# Patient Record
Sex: Male | Born: 1940 | Race: White | Hispanic: No | Smoking: Never smoker
Health system: Southern US, Community
[De-identification: ages and names within clinical notes are randomized; demographics above are authoritative.]

## PROBLEM LIST (undated history)

## (undated) DIAGNOSIS — G43909 Migraine, unspecified, not intractable, without status migrainosus: Secondary | ICD-10-CM

## (undated) DIAGNOSIS — K219 Gastro-esophageal reflux disease without esophagitis: Secondary | ICD-10-CM

## (undated) DIAGNOSIS — M545 Low back pain, unspecified: Secondary | ICD-10-CM

## (undated) DIAGNOSIS — I38 Endocarditis, valve unspecified: Secondary | ICD-10-CM

## (undated) DIAGNOSIS — I219 Acute myocardial infarction, unspecified: Secondary | ICD-10-CM

## (undated) DIAGNOSIS — I10 Essential (primary) hypertension: Secondary | ICD-10-CM

## (undated) DIAGNOSIS — E7800 Pure hypercholesterolemia, unspecified: Secondary | ICD-10-CM

## (undated) DIAGNOSIS — J189 Pneumonia, unspecified organism: Secondary | ICD-10-CM

## (undated) DIAGNOSIS — M1990 Unspecified osteoarthritis, unspecified site: Secondary | ICD-10-CM

## (undated) DIAGNOSIS — I2510 Atherosclerotic heart disease of native coronary artery without angina pectoris: Secondary | ICD-10-CM

## (undated) DIAGNOSIS — G8929 Other chronic pain: Secondary | ICD-10-CM

## (undated) DIAGNOSIS — D649 Anemia, unspecified: Secondary | ICD-10-CM

## (undated) DIAGNOSIS — Z950 Presence of cardiac pacemaker: Secondary | ICD-10-CM

## (undated) DIAGNOSIS — R011 Cardiac murmur, unspecified: Secondary | ICD-10-CM

## (undated) HISTORY — PX: APPENDECTOMY: SHX54

## (undated) HISTORY — DX: Gastro-esophageal reflux disease without esophagitis: K21.9

## (undated) HISTORY — PX: INGUINAL HERNIA REPAIR: SUR1180

## (undated) HISTORY — PX: CYSTECTOMY: SUR359

## (undated) HISTORY — PX: COLON SURGERY: SHX602

---

## 1997-09-28 ENCOUNTER — Observation Stay (HOSPITAL_COMMUNITY): Admission: AD | Admit: 1997-09-28 | Discharge: 1997-09-29 | Payer: Self-pay | Admitting: Cardiovascular Disease

## 1997-10-02 ENCOUNTER — Ambulatory Visit (HOSPITAL_BASED_OUTPATIENT_CLINIC_OR_DEPARTMENT_OTHER): Admission: RE | Admit: 1997-10-02 | Discharge: 1997-10-02 | Payer: Self-pay | Admitting: Orthopedic Surgery

## 1999-09-28 ENCOUNTER — Emergency Department (HOSPITAL_COMMUNITY): Admission: EM | Admit: 1999-09-28 | Discharge: 1999-09-28 | Payer: Self-pay | Admitting: Emergency Medicine

## 2001-01-23 ENCOUNTER — Emergency Department (HOSPITAL_COMMUNITY): Admission: EM | Admit: 2001-01-23 | Discharge: 2001-01-23 | Payer: Self-pay | Admitting: Emergency Medicine

## 2010-04-15 ENCOUNTER — Encounter: Payer: Self-pay | Admitting: Internal Medicine

## 2010-04-15 ENCOUNTER — Inpatient Hospital Stay (HOSPITAL_COMMUNITY)
Admission: EM | Admit: 2010-04-15 | Discharge: 2010-04-21 | Payer: Self-pay | Source: Home / Self Care | Attending: Internal Medicine | Admitting: Internal Medicine

## 2010-04-21 ENCOUNTER — Encounter: Payer: Self-pay | Admitting: Internal Medicine

## 2010-04-21 LAB — POCT I-STAT, CHEM 8
BUN: 20 mg/dL (ref 6–23)
Calcium, Ion: 1.07 mmol/L — ABNORMAL LOW (ref 1.12–1.32)
Chloride: 102 mEq/L (ref 96–112)
Creatinine, Ser: 1.3 mg/dL (ref 0.4–1.5)
Glucose, Bld: 136 mg/dL — ABNORMAL HIGH (ref 70–99)
HCT: 32 % — ABNORMAL LOW (ref 39.0–52.0)
Hemoglobin: 10.9 g/dL — ABNORMAL LOW (ref 13.0–17.0)
Potassium: 3.8 mEq/L (ref 3.5–5.1)
Sodium: 137 mEq/L (ref 135–145)
TCO2: 26 mmol/L (ref 0–100)

## 2010-04-21 LAB — CBC
HCT: 33.1 % — ABNORMAL LOW (ref 39.0–52.0)
HCT: 33.6 % — ABNORMAL LOW (ref 39.0–52.0)
HCT: 32.9 % — ABNORMAL LOW (ref 39.0–52.0)
Hemoglobin: 11.1 g/dL — ABNORMAL LOW (ref 13.0–17.0)
Hemoglobin: 11 g/dL — ABNORMAL LOW (ref 13.0–17.0)
Hemoglobin: 11 g/dL — ABNORMAL LOW (ref 13.0–17.0)
MCH: 29.8 pg (ref 26.0–34.0)
MCH: 29.4 pg (ref 26.0–34.0)
MCH: 30 pg (ref 26.0–34.0)
MCHC: 33.5 g/dL (ref 30.0–36.0)
MCHC: 32.7 g/dL (ref 30.0–36.0)
MCHC: 33.4 g/dL (ref 30.0–36.0)
MCV: 89 fL (ref 78.0–100.0)
MCV: 89.8 fL (ref 78.0–100.0)
MCV: 89.6 fL (ref 78.0–100.0)
Platelets: 122 10*3/uL — ABNORMAL LOW (ref 150–400)
Platelets: 161 10*3/uL (ref 150–400)
Platelets: 177 10*3/uL (ref 150–400)
RBC: 3.72 MIL/uL — ABNORMAL LOW (ref 4.22–5.81)
RBC: 3.74 MIL/uL — ABNORMAL LOW (ref 4.22–5.81)
RBC: 3.67 MIL/uL — ABNORMAL LOW (ref 4.22–5.81)
RDW: 13.5 % (ref 11.5–15.5)
RDW: 13.5 % (ref 11.5–15.5)
RDW: 13.6 % (ref 11.5–15.5)
WBC: 9.1 10*3/uL (ref 4.0–10.5)
WBC: 7.4 10*3/uL (ref 4.0–10.5)
WBC: 7 10*3/uL (ref 4.0–10.5)

## 2010-04-21 LAB — PROTIME-INR
INR: 1.16 (ref 0.00–1.49)
Prothrombin Time: 15 seconds (ref 11.6–15.2)

## 2010-04-21 LAB — COMPREHENSIVE METABOLIC PANEL
ALT: 14 U/L (ref 0–53)
AST: 20 U/L (ref 0–37)
Albumin: 3.2 g/dL — ABNORMAL LOW (ref 3.5–5.2)
Alkaline Phosphatase: 72 U/L (ref 39–117)
BUN: 16 mg/dL (ref 6–23)
CO2: 25 mEq/L (ref 19–32)
Calcium: 8.5 mg/dL (ref 8.4–10.5)
Chloride: 99 mEq/L (ref 96–112)
Creatinine, Ser: 1.15 mg/dL (ref 0.4–1.5)
GFR calc Af Amer: >60 mL/min (ref >60)
GFR calc non Af Amer: >60 mL/min (ref >60)
Glucose, Bld: 136 mg/dL — ABNORMAL HIGH (ref 70–99)
Potassium: 3.8 mEq/L (ref 3.5–5.1)
Sodium: 135 mEq/L (ref 135–145)
Total Bilirubin: 0.6 mg/dL (ref 0.3–1.2)
Total Protein: 6.6 g/dL (ref 6.0–8.3)

## 2010-04-21 LAB — BASIC METABOLIC PANEL
BUN: 12 mg/dL (ref 6–23)
CO2: 29 mEq/L (ref 19–32)
Calcium: 8.8 mg/dL (ref 8.4–10.5)
Chloride: 104 mEq/L (ref 96–112)
Creatinine, Ser: 1.13 mg/dL (ref 0.4–1.5)
GFR calc Af Amer: >60 mL/min (ref >60)
GFR calc non Af Amer: >60 mL/min (ref >60)
Glucose, Bld: 102 mg/dL — ABNORMAL HIGH (ref 70–99)
Potassium: 4.3 mEq/L (ref 3.5–5.1)
Sodium: 139 mEq/L (ref 135–145)

## 2010-04-21 LAB — URINALYSIS, ROUTINE W REFLEX MICROSCOPIC
Bilirubin Urine: NEGATIVE
Hgb urine dipstick: NEGATIVE
Ketones, ur: 15 mg/dL — AB
Leukocytes, UA: NEGATIVE
Nitrite: NEGATIVE
Protein, ur: 30 mg/dL — AB
Specific Gravity, Urine: 1.025 (ref 1.005–1.030)
Urine Glucose, Fasting: NEGATIVE mg/dL
Urobilinogen, UA: 0.2 mg/dL (ref 0.0–1.0)
pH: 5.5 (ref 5.0–8.0)

## 2010-04-21 LAB — VANCOMYCIN, TROUGH: Vancomycin Tr: 14.9 ug/mL (ref 10.0–20.0)

## 2010-04-21 LAB — DIFFERENTIAL
Basophils Absolute: 0 10*3/uL (ref 0.0–0.1)
Basophils Absolute: 0 10*3/uL (ref 0.0–0.1)
Basophils Relative: 0 % (ref 0–1)
Basophils Relative: 0 % (ref 0–1)
Eosinophils Absolute: 0.1 10*3/uL (ref 0.0–0.7)
Eosinophils Absolute: 0.4 10*3/uL (ref 0.0–0.7)
Eosinophils Relative: 1 % (ref 0–5)
Eosinophils Relative: 5 % (ref 0–5)
Lymphocytes Relative: 16 % (ref 12–46)
Lymphocytes Relative: 22 % (ref 12–46)
Lymphs Abs: 1.5 10*3/uL (ref 0.7–4.0)
Lymphs Abs: 1.6 10*3/uL (ref 0.7–4.0)
Monocytes Absolute: 1 10*3/uL (ref 0.1–1.0)
Monocytes Absolute: 0.9 10*3/uL (ref 0.1–1.0)
Monocytes Relative: 11 % (ref 3–12)
Monocytes Relative: 11 % (ref 3–12)
Neutro Abs: 6.5 10*3/uL (ref 1.7–7.7)
Neutro Abs: 4.6 10*3/uL (ref 1.7–7.7)
Neutrophils Relative %: 72 % (ref 43–77)
Neutrophils Relative %: 61 % (ref 43–77)

## 2010-04-21 LAB — RETICULOCYTES
RBC.: 3.74 MIL/uL — ABNORMAL LOW (ref 4.22–5.81)
Retic Count, Absolute: 71.1 10*3/uL (ref 19.0–186.0)
Retic Ct Pct: 1.9 % (ref 0.4–3.1)

## 2010-04-21 LAB — TROPONIN I: Troponin I: 0.04 ng/mL (ref 0.00–0.06)

## 2010-04-21 LAB — APTT: aPTT: 33 seconds (ref 24–37)

## 2010-04-21 LAB — LIPID PANEL
Cholesterol: 136 mg/dL (ref 0–200)
HDL: 37 mg/dL — ABNORMAL LOW (ref >39)
LDL Cholesterol: 90 mg/dL (ref 0–99)
Total CHOL/HDL Ratio: 3.7 RATIO
Triglycerides: 44 mg/dL (ref <150)
VLDL: 9 mg/dL (ref 0–40)

## 2010-04-21 LAB — TECHNOLOGIST SMEAR REVIEW

## 2010-04-21 LAB — URINE MICROSCOPIC-ADD ON

## 2010-04-21 LAB — POCT CARDIAC MARKERS
CKMB, poc: 1.1 ng/mL (ref 1.0–8.0)
Myoglobin, poc: 156 ng/mL (ref 12–200)
Troponin i, poc: 0.11 ng/mL — ABNORMAL HIGH (ref 0.00–0.09)

## 2010-04-21 LAB — CARDIAC PANEL(CRET KIN+CKTOT+MB+TROPI)
CK, MB: 1.8 ng/mL (ref 0.3–4.0)
CK, MB: 1.5 ng/mL (ref 0.3–4.0)
Relative Index: 1.7 (ref 0.0–2.5)
Relative Index: INVALID (ref 0.0–2.5)
Total CK: 109 U/L (ref 7–232)
Total CK: 94 U/L (ref 7–232)
Troponin I: 0.04 ng/mL (ref 0.00–0.06)
Troponin I: 0.05 ng/mL (ref 0.00–0.06)

## 2010-04-21 LAB — IRON AND TIBC
Iron: 17 ug/dL — ABNORMAL LOW (ref 42–135)
Saturation Ratios: 7 % — ABNORMAL LOW (ref 20–55)
TIBC: 258 ug/dL (ref 215–435)
UIBC: 241 ug/dL

## 2010-04-21 LAB — ABO/RH: ABO/RH(D): O POS

## 2010-04-21 LAB — TYPE AND SCREEN
ABO/RH(D): O POS
Antibody Screen: NEGATIVE

## 2010-04-21 LAB — HEMOCCULT GUIAC POC 1CARD (OFFICE): Fecal Occult Bld: NEGATIVE

## 2010-04-21 LAB — TRANSFERRIN: Transferrin: 166 mg/dL — ABNORMAL LOW (ref 212–360)

## 2010-04-21 LAB — CK TOTAL AND CKMB (NOT AT ARMC)
CK, MB: 1.8 ng/mL (ref 0.3–4.0)
Relative Index: INVALID (ref 0.0–2.5)
Total CK: 77 U/L (ref 7–232)

## 2010-04-21 LAB — FERRITIN: Ferritin: 163 ng/mL (ref 22–322)

## 2010-04-21 LAB — PATHOLOGIST SMEAR REVIEW

## 2010-04-23 LAB — CBC
HCT: 32 % — ABNORMAL LOW (ref 39.0–52.0)
Hemoglobin: 10.5 g/dL — ABNORMAL LOW (ref 13.0–17.0)
MCH: 29.4 pg (ref 26.0–34.0)
MCHC: 32.8 g/dL (ref 30.0–36.0)
MCV: 89.6 fL (ref 78.0–100.0)
Platelets: 179 10*3/uL (ref 150–400)
RBC: 3.57 MIL/uL — ABNORMAL LOW (ref 4.22–5.81)
RDW: 13.5 % (ref 11.5–15.5)
WBC: 7.2 10*3/uL (ref 4.0–10.5)

## 2010-04-23 LAB — CULTURE, BLOOD (ROUTINE X 2): Culture  Setup Time: 201201102213

## 2010-04-23 LAB — BASIC METABOLIC PANEL
BUN: 14 mg/dL (ref 6–23)
CO2: 28 mEq/L (ref 19–32)
Calcium: 8.5 mg/dL (ref 8.4–10.5)
Chloride: 103 mEq/L (ref 96–112)
Creatinine, Ser: 1.03 mg/dL (ref 0.4–1.5)
GFR calc Af Amer: >60 mL/min (ref >60)
GFR calc non Af Amer: >60 mL/min (ref >60)
Glucose, Bld: 94 mg/dL (ref 70–99)
Potassium: 4.1 mEq/L (ref 3.5–5.1)
Sodium: 136 mEq/L (ref 135–145)

## 2010-04-24 ENCOUNTER — Encounter: Payer: Self-pay | Admitting: Internal Medicine

## 2010-04-24 ENCOUNTER — Telehealth (INDEPENDENT_AMBULATORY_CARE_PROVIDER_SITE_OTHER): Payer: Self-pay | Admitting: *Deleted
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
## 2010-04-25 ENCOUNTER — Telehealth: Payer: Self-pay | Admitting: Internal Medicine

## 2010-04-28 ENCOUNTER — Encounter: Payer: Self-pay | Admitting: Internal Medicine

## 2010-04-28 LAB — CULTURE, BLOOD (ROUTINE X 2)
Culture  Setup Time: 201201141728
Culture  Setup Time: 201201141728
Culture: NO GROWTH
Culture: NO GROWTH

## 2010-04-29 ENCOUNTER — Telehealth (INDEPENDENT_AMBULATORY_CARE_PROVIDER_SITE_OTHER): Payer: Self-pay | Admitting: *Deleted

## 2010-05-01 LAB — CULTURE, BLOOD (ROUTINE X 2): Culture  Setup Time: 201201102213

## 2010-05-02 ENCOUNTER — Encounter: Payer: Self-pay | Admitting: Internal Medicine

## 2010-05-06 ENCOUNTER — Encounter: Payer: Self-pay | Admitting: Internal Medicine

## 2010-05-06 DIAGNOSIS — D509 Iron deficiency anemia, unspecified: Secondary | ICD-10-CM | POA: Insufficient documentation

## 2010-05-06 DIAGNOSIS — I421 Obstructive hypertrophic cardiomyopathy: Secondary | ICD-10-CM | POA: Insufficient documentation

## 2010-05-06 DIAGNOSIS — Z8679 Personal history of other diseases of the circulatory system: Secondary | ICD-10-CM | POA: Insufficient documentation

## 2010-05-06 DIAGNOSIS — R0989 Other specified symptoms and signs involving the circulatory and respiratory systems: Secondary | ICD-10-CM | POA: Insufficient documentation

## 2010-05-06 DIAGNOSIS — R0609 Other forms of dyspnea: Secondary | ICD-10-CM

## 2010-05-06 DIAGNOSIS — R9389 Abnormal findings on diagnostic imaging of other specified body structures: Secondary | ICD-10-CM | POA: Insufficient documentation

## 2010-05-06 DIAGNOSIS — R55 Syncope and collapse: Secondary | ICD-10-CM | POA: Insufficient documentation

## 2010-05-07 ENCOUNTER — Encounter: Payer: Self-pay | Admitting: Physician Assistant

## 2010-05-07 ENCOUNTER — Ambulatory Visit (INDEPENDENT_AMBULATORY_CARE_PROVIDER_SITE_OTHER): Payer: BC Managed Care – PPO | Admitting: Physician Assistant

## 2010-05-07 ENCOUNTER — Ambulatory Visit: Admit: 2010-05-07 | Payer: Self-pay | Admitting: Physician Assistant

## 2010-05-07 DIAGNOSIS — D509 Iron deficiency anemia, unspecified: Secondary | ICD-10-CM

## 2010-05-07 DIAGNOSIS — I421 Obstructive hypertrophic cardiomyopathy: Secondary | ICD-10-CM

## 2010-05-07 DIAGNOSIS — I330 Acute and subacute infective endocarditis: Secondary | ICD-10-CM | POA: Insufficient documentation

## 2010-05-08 ENCOUNTER — Encounter: Payer: Self-pay | Admitting: Internal Medicine

## 2010-05-08 NOTE — Progress Notes (Addendum)
----   Converted from flag ---- Flag Received  ---- 04/29/2010 10:45 AM, Bary Leriche wrote: Doristine Devoid, [employer] is sending Dr. Graciela Husbands a physicians statement on patient Alessandra Bevels.  dob 2040-07-09.  [employer] does not do this form.  If Dr. Graciela Husbands would review it, and return it to [employer], we will fax to his insurance co.   Thank you, Elease Hashimoto  @ [employer] ------------------------------

## 2010-05-08 NOTE — Progress Notes (Addendum)
Summary: pt calling re home health  Phone Note Call from Patient   Caller: Patient [phone] Reason for Call: Talk to Nurse Summary of Call: pt went home with pic line to [facility], [facility] to check him twice a week, however if he goes back to work monday his insurance will not cover home health-what to do? Initial call taken by: Glynda Jaeger,  April 25, 2010 2:12 PM  Follow-up for Phone Call        [facility]--Stephanie [phone] left a message for her to call back.  I spoke with the pt and made him aware if he goes back to work on Monday then technically he is not homebound and [facility] would not be able to treat him.  Per DC summary if looks like Vancomycin IV is to be continued through 05/15/10 and the pt would not be able to go back to work until after that day. Julieta Gutting, RN, BSN  April 25, 2010 2:40 PM  Additional Follow-up for Phone Call Additional follow up Details #1::        tried to reach pt, no answer. left a message for stephanie that pt has a follow up appt on 05-07-10 and he would probably need to be out of work until at least seen. Deliah Goody, RN  April 25, 2010 3:27 PM  Judeth Cornfield called back and they will continue to see the pt for Vancomycin.  The pt cannot go back to work until Vancomycin is completed. The pt has a pending appt with Dr Graciela Husbands on 05/07/10.  Pt was scheduled to return to work on 04/28/10. I made the pt aware that he cannot return to work.  The pt will contact his employer about what forms need to be completed in regards to remaining out of work and these will be sent to our office for completion. I will forward this information to Dr Graciela Husbands and Bjorn Loser for review.   Additional Follow-up by: Julieta Gutting, RN, BSN,  April 25, 2010 4:40 PM    Additional Follow-up for Phone Call Additional follow up Details #2::    vancomycin is under the care of Dr Bonnita Nasuti  Follow-up by: Nathen May, MD, [facility],  April 28, 2010 5:51  PM  Additional Follow-up for Phone Call Additional follow up Details #3:: Details for Additional Follow-up Action Taken: tried to reach [facility], rna.  spoke w/pt and he has the situation under control and will contact us if he needs further assisstance. he just needed some guidance when he called initially.  Additional Follow-up by: Claris Gladden RN,  April 28, 2010 6:32 PM

## 2010-05-08 NOTE — Progress Notes (Addendum)
  Received request from [facility] sent to [facility]  April 24, 2010 12:26 PM

## 2010-05-09 ENCOUNTER — Encounter: Payer: Self-pay | Admitting: Internal Medicine

## 2010-05-13 ENCOUNTER — Encounter: Payer: Self-pay | Admitting: Internal Medicine

## 2010-05-13 ENCOUNTER — Ambulatory Visit (INDEPENDENT_AMBULATORY_CARE_PROVIDER_SITE_OTHER): Payer: BC Managed Care – PPO | Admitting: Internal Medicine

## 2010-05-13 DIAGNOSIS — I330 Acute and subacute infective endocarditis: Secondary | ICD-10-CM

## 2010-05-13 DIAGNOSIS — D509 Iron deficiency anemia, unspecified: Secondary | ICD-10-CM
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
## 2010-05-15 ENCOUNTER — Encounter: Payer: Self-pay | Admitting: Internal Medicine

## 2010-05-15 ENCOUNTER — Encounter: Payer: Self-pay | Admitting: Physician Assistant

## 2010-05-20 ENCOUNTER — Telehealth (INDEPENDENT_AMBULATORY_CARE_PROVIDER_SITE_OTHER): Payer: Self-pay | Admitting: *Deleted
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
## 2010-05-26 NOTE — Discharge Summary (Signed)
  NAMELUCKY, [name] NO.:  [id number]  MEDICAL RECORD NO.:  [id number]          PATIENT TYPE:  INP  LOCATION:  3738                         FACILITY:  [facility]  PHYSICIAN:  Duke Salvia, MD, FACCDATE OF BIRTH:  April 23, 1940  DATE OF ADMISSION:  04/15/2010 DATE OF DISCHARGE:  04/21/2010                              DISCHARGE SUMMARY   ADDENDUM:  Dr. Daiva Eves evaluated Mr. Pompey and reviewed his antibiotic therapy.  The following med list reflects his changes.  DISCHARGE MEDICATIONS: 1. Lopressor 50 mg b.i.d. 2. Tylenol 325 mg 1-2 tablets q.4 h. p.r.n. 3. Multivitamins daily. 4. Vancomycin injection 1000 mg IV q.12 h. 5. Aspirin 325 mg daily. 6. Glucosamine take 1 in the morning and 2 in the evening. 7. Ginkgo biloba daily. 8. Bilberry OTC at bedtime. 9. Vancomycin protocol, Home Health to manage, duration of vancomycin     until May 15, 2010. 10.Cinnamon b.i.d. 11.Coenzyme Q10 3 tablets daily. 12.Hawhorn berry at bedtime. 13.Horse chestnut daily. 14.Vitamin C b.i.d. 15.Vitamin E 1-2 capsules b.i.d., 1 in the morning and 2 in the     evening.  WORK NOTE:  Stating that the patient may return to work in 1 week, but no lifting over 25 pounds with his right arm while the PICC line is in place.     Theodore Demark, PA-C   ______________________________ Duke Salvia, MD, [facility]    RB/MEDQ  D:  04/21/2010  T:  04/22/2010  Job:  [id number]  Electronically Signed by Theodore Demark PA-C on 04/28/2010 11:30:25 AM Electronically Signed by Sherryl Manges MD [facility] on 05/26/2010 09:49:33 PM
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
## 2010-06-03 NOTE — Letter (Signed)
Summary: [employer]  [employer]   Imported By: Florinda Marker 05/30/2010 15:51:21  _____________________________________________________________________  External Attachment:    Type:   Image     Comment:   External Document

## 2010-06-03 NOTE — Letter (Signed)
Summary: Discharge Summary  Discharge Summary   Imported By: Florinda Marker 05/30/2010 09:38:09  _____________________________________________________________________  External Attachment:    Type:   Image     Comment:   External Document

## 2010-06-03 NOTE — Miscellaneous (Signed)
Summary: [facility]: Verbal Orders  [facility]: Verbal Orders   Imported By: Florinda Marker 05/28/2010 11:53:03  _____________________________________________________________________  External Attachment:    Type:   Image     Comment:   External Document

## 2010-06-03 NOTE — Miscellaneous (Signed)
Summary: [facility]: Home Health Cert. & Plan Of Care  [facility]: Home Health Cert. & Plan Of Care   Imported By: Florinda Marker 05/30/2010 15:52:44  _____________________________________________________________________  External Attachment:    Type:   Image     Comment:   External Document

## 2010-06-03 NOTE — Medication Information (Signed)
Summary: Physician's Orders   Physician's Orders   Imported By: Roderic Ovens 05/22/2010 15:47:04  _____________________________________________________________________  External Attachment:    Type:   Image     Comment:   External Document

## 2010-06-05 ENCOUNTER — Other Ambulatory Visit (INDEPENDENT_AMBULATORY_CARE_PROVIDER_SITE_OTHER): Payer: BC Managed Care – PPO

## 2010-06-05 DIAGNOSIS — D509 Iron deficiency anemia, unspecified: Secondary | ICD-10-CM

## 2010-06-05 DIAGNOSIS — I330 Acute and subacute infective endocarditis: Secondary | ICD-10-CM

## 2010-06-06 ENCOUNTER — Encounter: Payer: Self-pay | Admitting: Internal Medicine

## 2010-06-11 ENCOUNTER — Encounter: Payer: Self-pay | Admitting: Internal Medicine

## 2010-06-11 ENCOUNTER — Other Ambulatory Visit: Payer: Self-pay | Admitting: Internal Medicine

## 2010-06-11 ENCOUNTER — Ambulatory Visit (INDEPENDENT_AMBULATORY_CARE_PROVIDER_SITE_OTHER): Payer: BC Managed Care – PPO | Admitting: Internal Medicine

## 2010-06-11 DIAGNOSIS — I428 Other cardiomyopathies: Secondary | ICD-10-CM

## 2010-06-11 DIAGNOSIS — R0609 Other forms of dyspnea: Secondary | ICD-10-CM

## 2010-06-11 DIAGNOSIS — R55 Syncope and collapse: Secondary | ICD-10-CM

## 2010-06-12 ENCOUNTER — Telehealth: Payer: Self-pay | Admitting: Internal Medicine

## 2010-06-12 LAB — CBC WITH DIFFERENTIAL/PLATELET
Basophils Absolute: 0 10*3/uL (ref 0.0–0.1)
Basophils Relative: 0.3 % (ref 0.0–3.0)
Eosinophils Absolute: 0.4 10*3/uL (ref 0.0–0.7)
Eosinophils Relative: 7.4 % — ABNORMAL HIGH (ref 0.0–5.0)
HCT: 34.2 % — ABNORMAL LOW (ref 39.0–52.0)
Hemoglobin: 11.7 g/dL — ABNORMAL LOW (ref 13.0–17.0)
Lymphocytes Relative: 27.5 % (ref 12.0–46.0)
Lymphs Abs: 1.6 10*3/uL (ref 0.7–4.0)
MCHC: 34.1 g/dL (ref 30.0–36.0)
MCV: 90.1 fl (ref 78.0–100.0)
Monocytes Absolute: 0.4 10*3/uL (ref 0.1–1.0)
Monocytes Relative: 7.3 % (ref 3.0–12.0)
Neutro Abs: 3.3 10*3/uL (ref 1.4–7.7)
Neutrophils Relative %: 57.5 % (ref 43.0–77.0)
Platelets: 124 10*3/uL — ABNORMAL LOW (ref 150.0–400.0)
RBC: 3.8 Mil/uL — ABNORMAL LOW (ref 4.22–5.81)
RDW: 15.6 % — ABNORMAL HIGH (ref 11.5–14.6)
WBC: 5.8 10*3/uL (ref 4.5–10.5)

## 2010-06-13 ENCOUNTER — Encounter: Payer: Self-pay | Admitting: *Deleted

## 2010-06-14 ENCOUNTER — Encounter: Payer: Self-pay | Admitting: *Deleted

## 2010-06-17 ENCOUNTER — Encounter: Payer: Self-pay | Admitting: Internal Medicine

## 2010-06-17 ENCOUNTER — Ambulatory Visit (INDEPENDENT_AMBULATORY_CARE_PROVIDER_SITE_OTHER): Payer: BC Managed Care – PPO | Admitting: Internal Medicine

## 2010-06-17 DIAGNOSIS — R55 Syncope and collapse: Secondary | ICD-10-CM

## 2010-06-17 DIAGNOSIS — I330 Acute and subacute infective endocarditis: Secondary | ICD-10-CM
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
## 2010-06-17 NOTE — Progress Notes (Signed)
  Phone Note Outgoing Call   Call placed by: Scherrie Bateman, LPN,  May 20, 2010 5:13 PM Call placed to: Patient Reason for Call: Get patient information Summary of Call: SPOKE WITH WIFE EARLIER STATED PT HAD LABS DONE ON 2/9./12 PER [facility]  AFTER CALLING [facility]  REALIZED HAD BMET DONE ON 2/9 /12 AND CBC WAS DONE ON 05/12/10 WILL REVIEW CBC AND DECIDE PLAN OF CARE  WITH DR Graciela Husbands.LMTCB WITH ABOVE INFO.  Follow-up for Phone Call        Phone call completed spoke with wife pt  has appt today with dr Graciela Husbands will repeat cbc at that time. Follow-up by: Scherrie Bateman, LPN,  June 11, 2010 10:26 AM

## 2010-06-17 NOTE — Miscellaneous (Signed)
Summary: [facility] Orders   [facility] Orders   Imported By: Roderic Ovens 06/12/2010 09:28:43  _____________________________________________________________________  External Attachment:    Type:   Image     Comment:   External Document

## 2010-06-17 NOTE — Progress Notes (Signed)
Summary: Need pt medical status  Phone Note Other Incoming   Caller: [employer] disability/ [phone] [facility] Summary of Call: Need information on the pt medical status on when pt can return to work Initial call taken by: Judie Grieve,  June 12, 2010 4:38 PM  Follow-up for Phone Call        attempted to contact [facility] at [phone]--this is a fax number Katina Dung, RN, BSN  June 12, 2010 4:41 PM   DISABILITY FORM RETURNED TO [place] ON THURS.AS I RECALL DRIVING RESTRICTIONS WERE NOTED PT MAY NOT DRIVE UNTIL JULY 2012 PER DR Graciela Husbands Follow-up by: Scherrie Bateman, LPN,  June 13, 2010 9:01 AM

## 2010-06-24 NOTE — Assessment & Plan Note (Signed)
Summary: f/u lab [mkj]   Vital Signs:  Patient profile:   70 year old male Height:      72 inches (182.88 cm) Weight:      198.25 pounds (90.11 kg) BMI:     26.98 Temp:     97.5 degrees F (36.39 degrees C) oral Pulse rate:   67 / minute BP sitting:   148 / 76  (left arm) Cuff size:   large  Vitals Entered By: Jennet Maduro RN (June 17, 2010 10:46 AM) CC: follow-up visit Is Patient Diabetic? No Pain Assessment Patient in pain? no      Nutritional Status BMI of 25 - 29 = overweight Nutritional Status Detail appetite "pretty good"  Have you ever been in a relationship where you felt threatened, hurt or afraid?not asked wife present   Does patient need assistance? Functional Status Self care Ambulation Normal   Primary Provider:  Dr. Ricki Miller  CC:  follow-up visit.  History of Present Illness: Mr. Wickham is in for his follow-up visit.  He has now been off of his intravenous vancomycin since February 9.  He is feeling well.  He has not had any fever, chills, or sweats.  His appetite is good.  He states that he had problems tolerating the full dose of metoprolol that Dr. Graciela Husbands started recently. He  found that when he was taking 75 mg twice daily he would feel dizzy when he stood up and felt like his head was in a "fog". He reduced his dose to 50 mg daily recently and feels much better.  He is wondering if it would be okay for him to drive now.  Preventive Screening-Counseling & Management  Alcohol-Tobacco     Alcohol drinks/day: <1     Smoking Status: never  Caffeine-Diet-Exercise     Caffeine use/day: yes  Safety-Violence-Falls     Seat Belt Use: yes  Current Medications (verified): 1)  Metoprolol Tartrate 50 Mg Tabs (Metoprolol Tartrate) .... Take 1 Tab By Mouth Two Times A Day 2)  Aspirin Ec 325 Mg Tbec (Aspirin) .... Take One Tablet By Mouth Daily 3)  Multivitamins   Tabs (Multiple Vitamin) .Marland Kitchen.. 1 Tab Once Daily 4)  Vitamin C 500 Mg Tabs (Ascorbic Acid) .... Take  1 Tablet By Mouth Two Times A Day 5)  Ginkgo Biloba 60 Mg Caps (Ginkgo Biloba) .... 2 Cap Once Daily 6)  Bilberry 500 Mg Caps (Bilberry (Vaccinium Myrtillus)) .... 1000mg  Per Day 7)  Horse Chestnut 300 Mg Tabs (Horse [place]) .... 2 Tab Once Daily 8)  Eql Coq10 300 Mg Caps (Coenzyme Q10) .... Take On Tablet By Mouth Once Daily 9)  Hawthorne Berries Extract 10)  Saw Palmetto 160 Mg Caps (Saw Palmetto (Serenoa Repens)) .... Take 1 Tablet By Mouth Once A Day 11)  Vitamin E 400 Unit Caps (Vitamin E) .... Take 1 Tablet By Mouth Once A Day  Allergies: 1)  ! * Penicillen  Physical Exam  General:  alert and well-nourished.   Mouth:  pharynx pink and moist.   Lungs:  normal breath sounds, no crackles, and no wheezes.   Heart:  normal rate, regular rhythm, and grade 2/6 holosystolic murmur.     Impression & Recommendations:  Problem # 1:  ENDOCARDITIS, BACTERIAL, SUBACUTE (ICD-421.0) Mr. Hayashi has no clinical evidence of relapse of his streptococcal endocarditis and has been off of all antibiotics for over one month.  Follow-up blood cultures were negative so I would consider him to be cured at this  point.  I see no problems proceeding ahead with ICD implantation if that's what he and Dr. Graciela Husbands and have decided to do.  I strongly suspect that his syncopal episode was related to his endocarditis superimposed on his hypertrophic cardiomyopathy.  I do not feel strongly that he be restricted from driving indefinitely but will leave that decision up to him and Dr. Graciela Husbands.  Orders: Est. Patient Level III (04540)  Medications Added to Medication List This Visit: 1)  Metoprolol Tartrate 50 Mg Tabs (Metoprolol tartrate) .... Take 1 tab by mouth two times a day  Patient Instructions: 1)  Please schedule a follow-up appointment as needed.   Orders Added: 1)  Est. Patient Level III [98119]

## 2010-06-25 ENCOUNTER — Telehealth: Payer: Self-pay | Admitting: Internal Medicine

## 2010-06-25 ENCOUNTER — Telehealth: Payer: Self-pay | Admitting: *Deleted

## 2010-06-25 NOTE — Telephone Encounter (Signed)
UNABLE TO LEAVE MESSAGE WILL TRY AGAIN TOMORROW. 

## 2010-06-25 NOTE — Telephone Encounter (Signed)
Pt states he has a fever. Pt does not have a primary doctor. Pt [phone]

## 2010-06-25 NOTE — Telephone Encounter (Signed)
He LM that he has been battling a fever x 2 days. Has gone as high as 102. Using bufferin. I called him back. We have no appts this week. He does not have a pcp until sometime in April. Urged him to call Cardiologist, Dr. Graciela Husbands. States he would. If unable to get in there asap, go to UC or ED. He agreed with plan.Faustino Congress

## 2010-06-26 ENCOUNTER — Telehealth: Payer: Self-pay | Admitting: Internal Medicine

## 2010-06-26 ENCOUNTER — Telehealth: Payer: Self-pay | Admitting: *Deleted

## 2010-06-26 NOTE — Telephone Encounter (Signed)
Has question re pt restriction.

## 2010-06-26 NOTE — Telephone Encounter (Signed)
SPOKE WITH PT REPORTS FEVER IS GONE ONLY COMPLAINT IS A LITTLE FATIGUE OTHER THAN THAT FEELS BETTER  INSTRUCTED TO CALL IN FUTURE WITH ANY PROBLEMS VERBALIZED UNDERSTANDING.

## 2010-06-26 NOTE — Telephone Encounter (Signed)
LMTCB

## 2010-06-26 NOTE — Telephone Encounter (Signed)
rec'd call from his job. Are there any restrictions at work on driving or lifting? Call [phone] or fax a letter to [phone]

## 2010-06-27 ENCOUNTER — Emergency Department (HOSPITAL_COMMUNITY): Payer: BC Managed Care – PPO

## 2010-06-27 ENCOUNTER — Inpatient Hospital Stay (HOSPITAL_COMMUNITY)
Admission: EM | Admit: 2010-06-27 | Discharge: 2010-07-02 | DRG: 126 | Disposition: A | Discharge status: Home Health | Payer: BC Managed Care – PPO | Attending: Internal Medicine | Admitting: Internal Medicine

## 2010-06-27 DIAGNOSIS — I517 Cardiomegaly: Secondary | ICD-10-CM

## 2010-06-27 DIAGNOSIS — I059 Rheumatic mitral valve disease, unspecified: Secondary | ICD-10-CM | POA: Diagnosis present

## 2010-06-27 DIAGNOSIS — N400 Enlarged prostate without lower urinary tract symptoms: Secondary | ICD-10-CM | POA: Diagnosis present

## 2010-06-27 DIAGNOSIS — K0530 Chronic periodontitis, unspecified: Secondary | ICD-10-CM | POA: Diagnosis present

## 2010-06-27 DIAGNOSIS — K029 Dental caries, unspecified: Secondary | ICD-10-CM | POA: Diagnosis present

## 2010-06-27 DIAGNOSIS — I421 Obstructive hypertrophic cardiomyopathy: Secondary | ICD-10-CM | POA: Diagnosis present

## 2010-06-27 DIAGNOSIS — B954 Other streptococcus as the cause of diseases classified elsewhere: Secondary | ICD-10-CM | POA: Diagnosis present

## 2010-06-27 DIAGNOSIS — I10 Essential (primary) hypertension: Secondary | ICD-10-CM | POA: Diagnosis present

## 2010-06-27 DIAGNOSIS — K469 Unspecified abdominal hernia without obstruction or gangrene: Secondary | ICD-10-CM | POA: Diagnosis present

## 2010-06-27 DIAGNOSIS — I079 Rheumatic tricuspid valve disease, unspecified: Secondary | ICD-10-CM | POA: Diagnosis present

## 2010-06-27 DIAGNOSIS — I330 Acute and subacute infective endocarditis: Principal | ICD-10-CM | POA: Diagnosis present

## 2010-06-27 LAB — CBC
HCT: 35.2 % — ABNORMAL LOW (ref 39.0–52.0)
MCH: 29.4 pg (ref 26.0–34.0)
MCV: 87.8 fL (ref 78.0–100.0)
Platelets: 93 10*3/uL — ABNORMAL LOW (ref 150–400)
RDW: 14.5 % (ref 11.5–15.5)
RDW: 14.5 % (ref 11.5–15.5)
WBC: 7.6 10*3/uL (ref 4.0–10.5)
WBC: 7.2 10*3/uL (ref 4.0–10.5)

## 2010-06-27 LAB — COMPREHENSIVE METABOLIC PANEL
Alkaline Phosphatase: 75 U/L (ref 39–117)
BUN: 16 mg/dL (ref 6–23)
Chloride: 103 mEq/L (ref 96–112)
Glucose, Bld: 110 mg/dL — ABNORMAL HIGH (ref 70–99)
Potassium: 3.7 mEq/L (ref 3.5–5.1)
Total Bilirubin: 1 mg/dL (ref 0.3–1.2)
Total Protein: 6.5 g/dL (ref 6.0–8.3)

## 2010-06-27 LAB — DIFFERENTIAL
Eosinophils Relative: 1 % (ref 0–5)
Lymphocytes Relative: 15 % (ref 12–46)
Lymphs Abs: 1.1 10*3/uL (ref 0.7–4.0)
Monocytes Relative: 11 % (ref 3–12)

## 2010-06-27 LAB — URINALYSIS, ROUTINE W REFLEX MICROSCOPIC
Nitrite: NEGATIVE
Protein, ur: NEGATIVE mg/dL
Urobilinogen, UA: 0.2 mg/dL (ref 0.0–1.0)

## 2010-06-27 LAB — CARDIAC PANEL(CRET KIN+CKTOT+MB+TROPI): CK, MB: 0.6 ng/mL (ref 0.3–4.0)

## 2010-06-27 LAB — POCT CARDIAC MARKERS: Troponin i, poc: <0.05 ng/mL (ref 0.00–0.09)

## 2010-06-27 LAB — BASIC METABOLIC PANEL
BUN: 14 mg/dL (ref 6–23)
GFR calc Af Amer: >60 mL/min (ref >60)
GFR calc non Af Amer: >60 mL/min (ref >60)
Potassium: 3.9 mEq/L (ref 3.5–5.1)
Sodium: 136 mEq/L (ref 135–145)

## 2010-06-27 LAB — CK TOTAL AND CKMB (NOT AT ARMC)
CK, MB: 1 ng/mL (ref 0.3–4.0)
Relative Index: INVALID (ref 0.0–2.5)

## 2010-06-27 LAB — TROPONIN I: Troponin I: 0.03 ng/mL (ref 0.00–0.06)

## 2010-06-28 DIAGNOSIS — I330 Acute and subacute infective endocarditis: Secondary | ICD-10-CM

## 2010-06-28 LAB — CARDIAC PANEL(CRET KIN+CKTOT+MB+TROPI)
CK, MB: 8.7 ng/mL (ref 0.3–4.0)
Total CK: 91 U/L (ref 7–232)

## 2010-06-29 DIAGNOSIS — I38 Endocarditis, valve unspecified: Secondary | ICD-10-CM

## 2010-06-29 LAB — COMPREHENSIVE METABOLIC PANEL WITH GFR
ALT: 14 U/L (ref 0–53)
AST: 25 U/L (ref 0–37)
Albumin: 3.1 g/dL — ABNORMAL LOW (ref 3.5–5.2)
Alkaline Phosphatase: 59 U/L (ref 39–117)
BUN: 15 mg/dL (ref 6–23)
CO2: 28 meq/L (ref 19–32)
Calcium: 8.6 mg/dL (ref 8.4–10.5)
Chloride: 106 meq/L (ref 96–112)
Creatinine, Ser: 1.14 mg/dL (ref 0.4–1.5)
GFR calc non Af Amer: >60 mL/min
Glucose, Bld: 107 mg/dL — ABNORMAL HIGH (ref 70–99)
Potassium: 3.9 meq/L (ref 3.5–5.1)
Sodium: 140 meq/L (ref 135–145)
Total Bilirubin: 0.8 mg/dL (ref 0.3–1.2)
Total Protein: 5.6 g/dL — ABNORMAL LOW (ref 6.0–8.3)

## 2010-06-29 LAB — CBC
HCT: 34 % — ABNORMAL LOW (ref 39.0–52.0)
Hemoglobin: 11.4 g/dL — ABNORMAL LOW (ref 13.0–17.0)
MCH: 29.5 pg (ref 26.0–34.0)
MCHC: 33.5 g/dL (ref 30.0–36.0)
MCV: 88.1 fL (ref 78.0–100.0)
Platelets: 98 10*3/uL — ABNORMAL LOW (ref 150–400)
RBC: 3.86 MIL/uL — ABNORMAL LOW (ref 4.22–5.81)
RDW: 14.6 % (ref 11.5–15.5)
WBC: 5.5 10*3/uL (ref 4.0–10.5)

## 2010-06-29 LAB — PHOSPHORUS: Phosphorus: 3.5 mg/dL (ref 2.3–4.6)

## 2010-06-29 LAB — TROPONIN I

## 2010-06-29 LAB — MAGNESIUM: Magnesium: 2.2 mg/dL (ref 1.5–2.5)

## 2010-06-29 LAB — CULTURE, BLOOD (ROUTINE X 2)

## 2010-06-29 LAB — SEDIMENTATION RATE: Sed Rate: 10 mm/hr (ref 0–16)

## 2010-06-30 ENCOUNTER — Ambulatory Visit (HOSPITAL_COMMUNITY)
Admission: RE | Admit: 2010-06-30 | Discharge: 2010-06-30 | Disposition: A | Discharge status: Home / Self Care | Payer: BC Managed Care – PPO | Source: Ambulatory Visit | Attending: Cardiovascular Disease | Admitting: Cardiovascular Disease

## 2010-06-30 ENCOUNTER — Inpatient Hospital Stay (HOSPITAL_COMMUNITY): Payer: BC Managed Care – PPO

## 2010-06-30 DIAGNOSIS — I339 Acute and subacute endocarditis, unspecified: Secondary | ICD-10-CM

## 2010-06-30 DIAGNOSIS — I059 Rheumatic mitral valve disease, unspecified: Secondary | ICD-10-CM | POA: Insufficient documentation

## 2010-06-30 DIAGNOSIS — I079 Rheumatic tricuspid valve disease, unspecified: Secondary | ICD-10-CM | POA: Insufficient documentation

## 2010-06-30 LAB — BASIC METABOLIC PANEL
BUN: 13 mg/dL (ref 6–23)
GFR calc Af Amer: >60 mL/min (ref >60)
GFR calc non Af Amer: >60 mL/min (ref >60)
Potassium: 4.3 mEq/L (ref 3.5–5.1)

## 2010-06-30 LAB — CARDIAC PANEL(CRET KIN+CKTOT+MB+TROPI)
CK, MB: 3.2 ng/mL (ref 0.3–4.0)
Total CK: 58 U/L (ref 7–232)
Troponin I: 0.68 ng/mL (ref 0.00–0.06)

## 2010-06-30 LAB — CULTURE, BLOOD (ROUTINE X 2): Culture  Setup Time: 201203231554

## 2010-06-30 LAB — MAGNESIUM: Magnesium: 2.2 mg/dL (ref 1.5–2.5)

## 2010-06-30 LAB — CBC
MCV: 88 fL (ref 78.0–100.0)
Platelets: 122 10*3/uL — ABNORMAL LOW (ref 150–400)
RDW: 14.3 % (ref 11.5–15.5)
WBC: 6.1 10*3/uL (ref 4.0–10.5)

## 2010-06-30 NOTE — Consult Note (Signed)
[name], [name]            ACCOUNT NO.:  [id number]  MEDICAL RECORD NO.:  [id number]           PATIENT TYPE:  I  LOCATION:  1416                         FACILITY:  [facility]  PHYSICIAN:  Cassell Clement, M.D. DATE OF BIRTH:  Mar 27, 1941  DATE OF CONSULTATION:  06/28/2010 DATE OF DISCHARGE:                                CONSULTATION   Please note that the patient also has a second unit number that was used on his previous hospitalization in January 2012.  His January admission is under a different unit number.  This is a very pleasant 70 year old Caucasian male who was admitted with fever and subsequent positive blood cultures.  The patient was hospitalized at [facility] from April 15, 2010, through April 21, 2010, with what was felt to be subacute bacterial endocarditis secondary to Streptococcus salivarius.  He had an echocardiogram during that admission which was negative for vegetations. It was a transthoracic echo.  He did not have a TEE on that admission because Infectious Disease did not feel that it would change the treatment.  The patient has a long history of a heart murmur dating back more than 30 years.  It was not noted when he was in the [employer] during the [place] War era, but was noted subsequently.  His echocardiogram done under that different unit number in January 2012 showed the findings of idiopathic hypertrophic subaortic stenosis with systolic anterior movement and hyperdynamic left ventricular function.  As noted, there were no vegetations noted.  The patient completed 4 weeks of intravenous antibiotics through a PICC line.  He completed that 4 weeks ago and felt well at the end of treatment.  Two weeks after he finished the antibiotics,  he had negative blood cultures.  He felt well until June 24, 2010, when he had onset of fever and malaise and since then has had some night sweats.  He came to the hospital on June 27, 2010, and was  admitted.  He had received his 4 weeks of vancomycin therapy.  PAST MEDICAL HISTORY:  Positive for BPH and long-standing heart murmur and essential hypertension.  HOME MEDICATIONS:  Include multiple vitamins and herbs.  His only prescribed medicine is metoprolol 50 mg twice a day.  ALLERGIES:  No known drug allergies.  FAMILY HISTORY:  Noncontributory.  REVIEW OF SYSTEMS:  Negative except as noted above.  He has not been experiencing any lymphadenopathy or acute arthralgias.  He has had occasional urinary urgency and hesitancy related to his BPH.  He has had recent increase in GERD symptoms, but no chest pain to suggest angina pectoris.  PHYSICAL EXAMINATION:  VITAL SIGNS:  His blood pressure is 95/64, pulse is 88, temperature 97, O2 sat 98% on room air. GENERAL APPEARANCE:  Reveals a healthy-appearing gentleman in no acute distress.  There are no skin lesions seen to suggest endocarditis. NECK:  The jugular venous pressure is normal.  Carotids normal upstroke and he does have a murmur audible in the neck transmitted from the heart. LUNGS:  Clear to auscultation. HEART:  Reveals a grade 3-4/6 harsh systolic murmur heard throughout the precordium, but loudest at the left  sternal edge and apex.  He is not on beta-blockers at the present time. ABDOMEN:  Soft and nontender.  No spleen is felt. EXTREMITIES:  Show no petechiae and no edema or phlebitis.  BLOOD WORK:  Includes a normal BUN of 14, creatinine of 1.20, CK-MB 0.06 rising to 8.7, troponin 0.04 rising to 1.05, hemoglobin is 11.8.  Chest x-ray shows normal heart size, no CHF.  EKG shows left ventricle hypertrophy with strain consistent with IHSS.  IMPRESSION: 1. Recurrent bacteremia 1 month after completing course of vancomycin     therapy for presumed endocarditis. 2. Presumptive subacute bacterial endocarditis, but no vegetations     seen on initial echo in January.  We will repeat the echo and if     necessary do a  TEE to look for the source.  Would have to keep in     mind could be a source other than his heart. 3. Longstanding idiopathic hypertrophic subaortic stenosis murmur, but     no evidence of decompensation or congestive heart failure. 4. Mild enzyme leak with elevated troponin and CK-MB possibly     secondary to stress of acute illness with his idiopathic     hypertrophic subaortic stenosis.  Less likely would be small emboli     to the coronary arteries.  PLAN:  Antibiotics as per Dr. Daiva Eves.  We will check a 2-D echo and depending on results, consider a TEE next week.  I will restart his metoprolol for his IHSS at a lower dose since his blood pressure is marginal.  Many thanks for allowing [place] to share in this pleasant gentleman's care.          ______________________________ Cassell Clement, M.D.     TB/MEDQ  D:  06/28/2010  T:  06/28/2010  Job:  [id number]  cc:   Acey Lav, MD Fax: [phone]  [facility]  Electronically Signed by Cassell Clement M.D. on 06/30/2010 12:30:17 PM

## 2010-07-01 ENCOUNTER — Inpatient Hospital Stay (HOSPITAL_COMMUNITY): Payer: BC Managed Care – PPO

## 2010-07-01 ENCOUNTER — Encounter (HOSPITAL_COMMUNITY): Payer: Self-pay | Admitting: Radiology

## 2010-07-01 LAB — CBC
Hemoglobin: 11 g/dL — ABNORMAL LOW (ref 13.0–17.0)
MCH: 29.5 pg (ref 26.0–34.0)
MCHC: 33.4 g/dL (ref 30.0–36.0)
Platelets: 132 10*3/uL — ABNORMAL LOW (ref 150–400)
RDW: 14.4 % (ref 11.5–15.5)

## 2010-07-01 LAB — BASIC METABOLIC PANEL
CO2: 28 mEq/L (ref 19–32)
Calcium: 8.5 mg/dL (ref 8.4–10.5)
Creatinine, Ser: 1.22 mg/dL (ref 0.4–1.5)
GFR calc Af Amer: >60 mL/min (ref >60)
GFR calc non Af Amer: 59 mL/min — ABNORMAL LOW (ref >60)
Sodium: 138 mEq/L (ref 135–145)

## 2010-07-01 MED ORDER — IOHEXOL 300 MG/ML  SOLN
100.0000 mL | Freq: Once | INTRAMUSCULAR | Status: AC | PRN
  Administered 2010-07-01: 100 mL via INTRAVENOUS

## 2010-07-01 NOTE — Telephone Encounter (Signed)
RN reviewed last OV note from Dr. Orvan Falconer from 06/17/2010.  Dr. Orvan Falconer noted that Dr. Sherryl Manges, Cardiologist, would be responsible for determining when the pt. Could return to work and what restrictions that the pt. May have.  RN returned the pt's call.  Left message encouraging the pt. To call Dr. Odessa Fleming office to obtain the necessary documents.  Jennet Maduro, RN

## 2010-07-01 NOTE — Telephone Encounter (Signed)
lmtcb ./cy 

## 2010-07-02 DIAGNOSIS — K036 Deposits [accretions] on teeth: Secondary | ICD-10-CM

## 2010-07-02 DIAGNOSIS — K029 Dental caries, unspecified: Secondary | ICD-10-CM

## 2010-07-02 DIAGNOSIS — K045 Chronic apical periodontitis: Secondary | ICD-10-CM

## 2010-07-02 DIAGNOSIS — K0530 Chronic periodontitis, unspecified: Secondary | ICD-10-CM

## 2010-07-02 NOTE — Consult Note (Signed)
[name], [name]            ACCOUNT NO.:  [id number]  MEDICAL RECORD NO.:  [id number]           PATIENT TYPE:  I  LOCATION:  1416                         FACILITY:  [facility]  PHYSICIAN:  Charlynne Pander, D.D.S.DATE OF BIRTH:  15-Sep-1940  DATE OF CONSULTATION:  07/01/2010 DATE OF DISCHARGE:                                CONSULTATION   HISTORY:  [name] is a 70 year old male referred by Dr. Isidor [name] for dental consultation.  The patient with a history of recurrent infective endocarditis.  The patient currently on IV antibiotic therapy with Rocephin.  The patient referred for evaluation of possible dental etiology for the recurrent infective endocarditis.  MEDICAL HISTORY.: 1. Recurrent infective endocarditis.     a. Initial episode with a history of strep salivarius bacteremia in January 2012.          The patient is status post 4 weeks of IV vancomycin therapy.     b. History of streptococcal viridans bacteremia this admission with current IV         antibiotic therapy with Rocephin 2 g IV every 24 hours for most likely the         next 6 weeks.  The patient is followed by Dr. Daiva [name] with Infectious Disease         for antibiotic therapy input. 2. Idiopathic hypertrophic subaortic stenosis (IHSS), currently     stable. 3. Hypertension, currently stable. 4. History of benign prostatic hypertrophy. 5. Right-sided hernia. 6. History of thrombocytopenia this admission.  ALLERGIES/DRUG REACTION:  PENICILLIN causes a rash  (The patient has tolerated Keflex and current Rocephin therapy without problems).  MEDICATIONS: 1. Aspirin 81 mg daily. 2. Rocephin 2 g IV every 24 hours. 3. Lovenox 40 mg subcutaneously daily. 4. Lopressor 50 mg twice daily. 5. Protonix 40 mg daily.  SOCIAL HISTORY:  The patient is married.  The patient is a nonsmoker, nondrinker.  FAMILY HISTORY:  Noncontributory.  FUNCTIONAL ASSESSMENT:  The patient is independent for ADLs prior  to this admission.  REVIEW OF SYSTEMS:  This is reviewed from the chart for this admission.  DENTAL HISTORY.:  CHIEF COMPLAINT:  The patient referred for evaluation of possible dental etiology for the bacteremia.  HISTORY OF PRESENT ILLNESS:  The patient with a history of recurrent infective endocarditis.  The patient with initial episode in January of 2012 which was treated with 4 weeks of IV vancomycin therapy.  The patient subsequent developed further symptoms and was admitted for this admission and found to have a strep viridans bacteremia.  The patient currently placed under IV antibiotic therapy with Rocephin under the direction of Dr. Daiva [name] with Infectious Disease.  A dental consultation then requested to evaluate for possible dental etiology for the bacteremia and endocarditis..  The patient currently denies acute toothache, swellings or abscesses. The patient was last seen approximately 5 years ago by Dr. Hulen [name]. The patient with a history of root canal therapy with Dr. Duwaine [name] on the lower left molar around that time.  The patient does not seek regular dental care and is in need of a dental cleaning.  DENTAL EXAM:  GENERAL:  The patient is a well-developed, well-nourished male in no acute distress. VITAL SIGNS:  His blood pressure is 102/69, pulse rate 95, respirations are 18, temperature 97.1. HEAD AND NECK EXAM:  There is no palpable submandibular lymphadenopathy. The patient denies acute TMJ symptoms.   INTRAORAL EXAM:  The patient has normal saliva.  The patient has bilateral  mandibular lingual tori. There is no evidence of abscess formation within the mouth. DENTITION:  The patient is missing tooth numbers 1, 4, 7, 16, 19, 20, 29, and 30 by my examination. PERIODONTAL:  The patient with chronic periodontitis with minimal plaque accumulations noted.  I did not perform a full periodontal charting today.  There is no significant tooth mobility noted  today. DENTAL CARIES:  There are several dental caries and suboptimal dental restorations noted.  Please see dental charting form.  Specifically tooth #28 appears to have dental caries on the buccal aspect of the crown associated with tooth #28.  I am not able to determine the extent of the dental caries due to the presence of the crown, however. CROWN OR BRIDGE:  The patient has multiple crown and bridge restorations.  Some of the margins are less than ideal due to the age of the older restorations.  Specifically tooth #28 may have less than ideal margins at the distal of tooth #28.  Also the occlusal resin initially placed over the root canal therapy of #18 is less than ideal. ENDODONTIC:  The patient denies acute pulpitis symptoms.  The patient has had a previous root canal therapy associated with tooth #18.  There is some persistent darkness around the apices and I am not sure if this represents persistent periapical infection or worsening of the periapical infection.  This area was also noted on the CT scan performed during this admission.  I will suggest referral to an endodontist for evaluation and to determine the overall success of the previous root canal therapy approximately 5 years ago associated tooth #18.  The patient also appears to have slight increased darkening around the root of tooth #28 which may represent incipient periapical pathology at this time and thus will need to evaluate this area further for need for root canal therapy.  This can be done in conjunction with the primary dentist to determine if tooth will need to be excavated prior to referral to the endodontist or if the patient will need to be referred to the endodontist first before further definitive care for tooth #28. PROSTHODONTIC:  The patient without partial dentures. OCCLUSION:  The patient with a poor occlusal scheme but a stable occlusion at this time.  RADIOGRAPHIC INTERPRETATION:  Previous  panoramic x-rays were taken in January 2012 and March 2012 at [facility] respectively in the Department Of Radiology.  I did obtain a full series of dental radiographs today.  There are multiple missing teeth.  There is incipient to moderate bone loss.  There are multiple dental restorations noted.  There is a root canal therapy associated tooth #18 with some persistent periapical radiolucency at the apices.  Suggest further evaluation by the endodontist to determine if this is persistence or a worsening of previous periapical condition.  The patient also has some incipient periapical radiolucency at the apex tooth #28.  This may represent incipient periapical pathology as well.  ASSESSMENT: 1. Strep viridans bacteremia and endocarditis with current IV     antibiotic therapy with Rocephin per Infectious Disease. 2. Some potential persistent periapical pathology  associated with     tooth #18 after root canal therapy with the endodontist     approximately 5 years ago by the patient report.  This will need to     be evaluated by the endodontist further to determine if this is     persistence or worsening of the previous periapical pathology noted     in this area. 3. Incipient periapical radiolucency at the apex tooth #28.  This will     need further evaluation by primary dentist or endodontist for     possible periapical pathology and require treatment with root canal     therapy or extraction. 4. Dental caries and suboptimal dental restorations as per dental     charting form with need for evaluation of the occlusal resin     associated with tooth #18 as well as the buccal caries associated     with tooth #28 through the crown. 5. Chronic periodontitis with bone loss. 6. Plaque accumulations. 7. History of dental neglect over the past 5 years. 8. Need for antibiotic premedication prior to invasive dental     procedures due to previous and current history of  endocarditis.     This will not need to be done most likely while the patient is     under current IV Rocephin therapy but will need to be done once the     patient is off IV antibiotic therapy.  The patient may consider     either macrolide antibiotic therapy or clindamycin antibiotic     therapy as per American Heart Association guidelines. 9. History of thrombocytopenia, to be followed as indicated for     potential risk for bleeding with invasive dental procedures.  PLAN/RECOMMENDATIONS: 1. I discussed the risks, benefits, and complications of various     treatment options in relationship to the patient's medical and     dental conditions.  The patient is to follow up with his primary     dentist Dr. Hulen [name] for evaluation of tooth #28 with eventual     referral to the endodontist for re-evaluation of the previous root     canal therapy associated with tooth #8 as well as definitive root     canal therapy of tooth #28 if indicated.  The patient most likely     will not need antibiotic premedication due to current IV therapy at     this time but will need antibiotic premedication after the patient     is off his systemic IV therapy for treatment of the endocarditis at     this time. 2. Discussion of findings with Dr. Hulen [name] and Dr. Duwaine [name]     (endodontist) as indicated to assist in coordination of future     dental care. 3. Discussion of findings with the medical team as needed to assist in     coordination of future medical care.     Charlynne Pander, D.D.S.     RFK/MEDQ  D:  07/02/2010  T:  07/02/2010  Job:  [id number]  cc:   William Lav, MD Fax: [phone]  Vesta Mixer, M.D. Fax: [phone]  Charlynne Pander, D.D.S. Fax: [phone]  Dr. Meredeth Ide, M.D.  Dr. Duwaine [name]  Electronically Signed by Cindra [name] D.D.S. on 07/02/2010 02:48:42 PM
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
## 2010-07-03 LAB — CULTURE, BLOOD (ROUTINE X 2): Culture  Setup Time: 201203232346

## 2010-07-04 ENCOUNTER — Encounter: Payer: Self-pay | Admitting: *Deleted

## 2010-07-04 ENCOUNTER — Telehealth: Payer: Self-pay | Admitting: Internal Medicine
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
## 2010-07-04 NOTE — Telephone Encounter (Signed)
AT AND T AWARE PT MAY NOT DRIVE UNTIL July PER DISABILITY NOTE

## 2010-07-05 LAB — CULTURE, BLOOD (ROUTINE X 2)
Culture  Setup Time: 201203251727
Culture: NO GROWTH
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
## 2010-07-07 NOTE — Discharge Summary (Signed)
[name], [name]            ACCOUNT NO.:  [id number]  MEDICAL RECORD NO.:  [id number]           PATIENT TYPE:  I  LOCATION:  1416                         FACILITY:  [facility]  PHYSICIAN:  Hollice Espy, M.D.DATE OF BIRTH:  16-Oct-1940  DATE OF ADMISSION:  06/27/2010 DATE OF DISCHARGE:  07/02/2010                              DISCHARGE SUMMARY   PRIMARY CARE PHYSICIAN:  The patient's PCP is going to be established as Dr. Juline Patch.  DENTIST:  His dentist is Dr. Hulen Skains.  ENDODONTIST:  His endodontist will be Dr. Duwaine Maxin.  OTHER CONSULTANTS:  His other consultants on this case are Dr. Graciela Husbands, Cardiology, and Dr. Daiva Eves, Infectious Disease  DISCHARGE DIAGNOSES: 1. Recurrent infective endocarditis with Streptococcus viridans this     admission. 2. Idiopathic hypertrophic subaortic stenosis. 3. Hypertension. 4. Benign prostatic hypertrophy. 5. Right-sided hernia. 6. Poor dentition. 7. History of thrombocytopenia 8. Periapical pathology associated with tooth #18 and incipient     periapical radiolucency at apex of tooth #28. 9. Dental caries. 10.Chronic periodontitis. 11.Plaque accumulations.  DISCHARGE MEDICATIONS:  Discharge medications for this patient are as follows:  Aspirin 81 mg p.o. daily, Rocephin 2 g IV daily for the next 39 days, ending date Aug 11, 2010.  The patient will continue on all of his herbal medications bilberry 1000, coenzyme Q 300, Gingko biloba 120, glucosamine 1000, Hawthorn Berry 565 b.i.d., horse-chestnut 300 p.o. b.i.d., metoprolol 50 b.i.d., multivitamin daily, saw palmetto 160, vitamin C 500 p.o. b.i.d., and vitamin E 400 p.o. b.i.d.  DISCHARGE DIET:  Heart-healthy diet.  ACTIVITY:  The patient is being advised to return back to work no earlier than Monday, July 14, 2010, and then after that a week of light activity.  HOSPITAL COURSE:  The patient is a 70 year old white male who was admitted on June 27, 2010, who has been  having on and off fevers and had been diagnosed and discharged on April 21, 2010, with a PICC line and 4 weeks of IV vancomycin for suspected endocarditis.  He came back in, was complaining of fevers and chills, was noted to not have a white count, no visible signs of infection.  Dr. Patty Sermons from Cardiology was consulted who evaluated the patient.  He was evaluated and was seem to have a presumptive subacute bacterial endocarditis.  A repeat echo was ordered and the patient was on IV antibiotics as well, specifically initially vancomycin and then  Rocephin for 7 days.  His blood cultures grew out viridans group streptococci.  When his 2-D echo did not show any findings, a TEE was ordered, he had a sizable vegetation, TEE was unremarkable as well.  Dental source of infection was considered given the patient's poor dentition.  Dr. Kristin Bruins from the dentistry was consulted.  He ordered a full series of dental radiographs done through tooth #28.  He was noted to have some potential persistent periapical pathology at tooth #18 after root canal therapy that had been done 5 years back.  He also noted some incipient periapical radiolucency at apex of tooth #28.  Dr. Kristin Bruins recommended discussion with Dr. Hulen Skains the patient's dentist 5  years ago for reevaluation of #28 with eventual referral to endodontist.  Dr. Kristin Bruins has been in touch with Dr. Duwaine Maxin who is an endodontist to determine if the persistence of worsening of the previous periapical pathology was noted again in this area as well as the one at #28.  He also recommended continuing on IV antibiotics for the full length of time recommended.  With these findings, the patient was otherwise felt to be medically doing well.  It was recommend because of his endocarditis he will be discharged home.  Will continue on daily IV antibiotics with plans to return back in about a week-and-a-half and at that time will need light work for  one more week's time.  His disposition is improved.  ACTIVITY:  Slowly increase.  DISCHARGE DIET:  Heart-healthy diet.  FOLLOWUP:  He will follow up with Dr. Juline Patch in the next few weeks.  He will follow up with Infectious Disease Dr. Daiva Eves 2 weeks after his antibiotic course is completed which would be approximately sometime around Aug 25, 2010.  Because of his history of subaortic stenosis, he will follow up with Dr. Graciela Husbands of Cardiology in 1 month.  His other medical issues are stable and he will have followup repeat blood cultures done by [facility] in about 2 weeks time.  Those results are to be sent to Dr. Daiva Eves of Infectious Disease.     Hollice Espy, M.D.     SKK/MEDQ  D:  07/02/2010  T:  07/03/2010  Job:  [id number]  cc:   Acey Lav, MD Fax: [phone]  Duke Salvia, MD, [facility] [address]  [address] [place] [address]  Cassell Clement, M.D. Fax: [phone]  Charlynne Pander, D.D.S. Fax: [phone]  Dr. Duwaine Maxin  Dr. Hulen Skains  Juline Patch, M.D. Fax: [phone]  Electronically Signed by Virginia Rochester M.D. on 07/07/2010 02:37:32 PM

## 2010-07-08 ENCOUNTER — Ambulatory Visit: Payer: BC Managed Care – PPO | Admitting: Internal Medicine

## 2010-07-23 ENCOUNTER — Ambulatory Visit: Payer: BC Managed Care – PPO | Admitting: Internal Medicine

## 2010-08-04 ENCOUNTER — Telehealth: Payer: Self-pay | Admitting: *Deleted

## 2010-08-04 NOTE — Telephone Encounter (Signed)
I already called them about this a week ago. The picc needs to come out. Blood cultures can be checked in our clinic at the followup appt

## 2010-08-04 NOTE — Telephone Encounter (Signed)
Pt's last IV antibiotic date is May 6th.  Pt's work has him starting back on May 7th.  Pt's next appt is May 21st.  He is due to have bld cultures drawn 2 weeks after the last dose of IV antibiotic.  The [facility] RN and the pt want to know whether it is OK for the pt to return to work on May 7th?  Also, does the PICC need d/ced after the last dose of antibiotic?  Please call the pt at home, [phone].  Yevonne Pax # [phone].  Jennet Maduro, RN

## 2010-08-07 ENCOUNTER — Encounter: Payer: Self-pay | Admitting: Infectious Disease

## 2010-08-18 ENCOUNTER — Encounter: Payer: Self-pay | Admitting: Infectious Disease

## 2010-08-22 ENCOUNTER — Encounter: Payer: Self-pay | Admitting: Infectious Disease

## 2010-08-25 ENCOUNTER — Encounter: Payer: Self-pay | Admitting: Infectious Disease

## 2010-08-25 ENCOUNTER — Ambulatory Visit (INDEPENDENT_AMBULATORY_CARE_PROVIDER_SITE_OTHER): Payer: BC Managed Care – PPO | Admitting: Infectious Disease

## 2010-08-25 DIAGNOSIS — M79601 Pain in right arm: Secondary | ICD-10-CM

## 2010-08-25 DIAGNOSIS — A491 Streptococcal infection, unspecified site: Secondary | ICD-10-CM | POA: Insufficient documentation

## 2010-08-25 DIAGNOSIS — Z9889 Other specified postprocedural states: Secondary | ICD-10-CM

## 2010-08-25 DIAGNOSIS — M79609 Pain in unspecified limb: Secondary | ICD-10-CM

## 2010-08-25 DIAGNOSIS — N289 Disorder of kidney and ureter, unspecified: Secondary | ICD-10-CM

## 2010-08-25 DIAGNOSIS — I330 Acute and subacute infective endocarditis: Secondary | ICD-10-CM

## 2010-08-25 DIAGNOSIS — B954 Other streptococcus as the cause of diseases classified elsewhere: Secondary | ICD-10-CM

## 2010-08-25 DIAGNOSIS — Z95828 Presence of other vascular implants and grafts: Secondary | ICD-10-CM

## 2010-08-25 DIAGNOSIS — K047 Periapical abscess without sinus: Secondary | ICD-10-CM | POA: Insufficient documentation

## 2010-08-25 DIAGNOSIS — K044 Acute apical periodontitis of pulpal origin: Secondary | ICD-10-CM

## 2010-08-25 NOTE — Assessment & Plan Note (Signed)
On reviewing his labs he is metabolic panel showed an increase in his creatinine which are now repeating today.

## 2010-08-25 NOTE — Assessment & Plan Note (Signed)
See above

## 2010-08-25 NOTE — Assessment & Plan Note (Signed)
See above status post root canal.
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
## 2010-08-25 NOTE — Assessment & Plan Note (Signed)
Patient has received 2 courses of antibiotics for presumed endocarditis first for a Streptococcus salivariius is species secondly for a very dense groups streptococci. Again a source of infection turned out to be his teeth. Hopefully this will last of his bacteremia s for sometime

## 2010-08-25 NOTE — Assessment & Plan Note (Signed)
See above plan. He cleared this bacteremia without being given an AHA guideline endocarditis regimen.

## 2010-08-25 NOTE — Assessment & Plan Note (Signed)
Will check Dopplers of the right upper extremity exclude a deep venous thrombosis. I don't think is a very likely diagnosis though.

## 2010-08-26 ENCOUNTER — Other Ambulatory Visit: Payer: Self-pay | Admitting: Licensed Clinical Social Worker

## 2010-08-26 DIAGNOSIS — M79603 Pain in arm, unspecified: Secondary | ICD-10-CM

## 2010-08-26 LAB — BASIC METABOLIC PANEL WITH GFR
Calcium: 8.9 mg/dL (ref 8.4–10.5)
Creat: 1.07 mg/dL (ref 0.40–1.50)
GFR, Est African American: >60 mL/min (ref >60)
GFR, Est Non African American: >60 mL/min (ref >60)
Sodium: 142 mEq/L (ref 135–145)

## 2010-08-31 LAB — CULTURE, BLOOD (SINGLE)
Organism ID, Bacteria: NO GROWTH
Organism ID, Bacteria: NO GROWTH

## 2010-09-04 ENCOUNTER — Ambulatory Visit (HOSPITAL_COMMUNITY)
Admission: RE | Admit: 2010-09-04 | Discharge: 2010-09-04 | Disposition: A | Discharge status: Home / Self Care | Payer: BC Managed Care – PPO | Source: Ambulatory Visit | Attending: Infectious Disease | Admitting: Infectious Disease

## 2010-09-04 ENCOUNTER — Telehealth: Payer: Self-pay | Admitting: *Deleted

## 2010-09-04 DIAGNOSIS — M79603 Pain in arm, unspecified: Secondary | ICD-10-CM

## 2010-09-04 DIAGNOSIS — R229 Localized swelling, mass and lump, unspecified: Secondary | ICD-10-CM | POA: Insufficient documentation

## 2010-09-04 DIAGNOSIS — M79609 Pain in unspecified limb: Secondary | ICD-10-CM

## 2010-09-04 NOTE — Telephone Encounter (Signed)
The heart & vascular did a doppler of his upper extremity where he had the PICC. It is negative- no clots

## 2010-11-04 ENCOUNTER — Ambulatory Visit (HOSPITAL_COMMUNITY)
Admission: RE | Admit: 2010-11-04 | Discharge: 2010-11-04 | Disposition: A | Discharge status: Home / Self Care | Payer: BC Managed Care – PPO | Source: Ambulatory Visit | Attending: Cardiology | Admitting: Cardiology

## 2010-11-04 DIAGNOSIS — I252 Old myocardial infarction: Secondary | ICD-10-CM | POA: Insufficient documentation

## 2010-11-04 DIAGNOSIS — I359 Nonrheumatic aortic valve disorder, unspecified: Secondary | ICD-10-CM | POA: Insufficient documentation

## 2010-11-04 LAB — POCT I-STAT 3, ART BLOOD GAS (G3+)
Acid-base deficit: 2 mmol/L (ref 0.0–2.0)
Bicarbonate: 24.9 mEq/L — ABNORMAL HIGH (ref 20.0–24.0)
Bicarbonate: 23.4 mEq/L (ref 20.0–24.0)
TCO2: 25 mmol/L (ref 0–100)
pH, Arterial: 7.371 (ref 7.350–7.450)
pO2, Arterial: 76 mmHg — ABNORMAL LOW (ref 80.0–100.0)
pO2, Arterial: 75 mmHg — ABNORMAL LOW (ref 80.0–100.0)

## 2010-11-04 LAB — POCT I-STAT 3, VENOUS BLOOD GAS (G3P V)
TCO2: 29 mmol/L (ref 0–100)
pCO2, Ven: 47.5 mmHg (ref 45.0–50.0)
pH, Ven: 7.369 — ABNORMAL HIGH (ref 7.250–7.300)

## 2010-11-11 ENCOUNTER — Ambulatory Visit (HOSPITAL_COMMUNITY)
Admission: RE | Admit: 2010-11-11 | Discharge: 2010-11-12 | Disposition: A | Discharge status: Home / Self Care | Payer: Medicare Other | Source: Ambulatory Visit | Attending: Cardiology | Admitting: Cardiology

## 2010-11-11 DIAGNOSIS — Z01810 Encounter for preprocedural cardiovascular examination: Secondary | ICD-10-CM | POA: Insufficient documentation

## 2010-11-11 DIAGNOSIS — I2510 Atherosclerotic heart disease of native coronary artery without angina pectoris: Secondary | ICD-10-CM | POA: Insufficient documentation

## 2010-11-11 DIAGNOSIS — Z79899 Other long term (current) drug therapy: Secondary | ICD-10-CM | POA: Insufficient documentation

## 2010-11-11 DIAGNOSIS — D696 Thrombocytopenia, unspecified: Secondary | ICD-10-CM | POA: Insufficient documentation

## 2010-11-11 DIAGNOSIS — I10 Essential (primary) hypertension: Secondary | ICD-10-CM | POA: Insufficient documentation

## 2010-11-11 DIAGNOSIS — I359 Nonrheumatic aortic valve disorder, unspecified: Secondary | ICD-10-CM | POA: Insufficient documentation

## 2010-11-11 DIAGNOSIS — Z01812 Encounter for preprocedural laboratory examination: Secondary | ICD-10-CM | POA: Insufficient documentation

## 2010-11-11 DIAGNOSIS — I517 Cardiomegaly: Secondary | ICD-10-CM | POA: Insufficient documentation

## 2010-11-11 HISTORY — PX: CORONARY ANGIOPLASTY WITH STENT PLACEMENT: SHX49

## 2010-11-11 LAB — BASIC METABOLIC PANEL
Calcium: 9.1 mg/dL (ref 8.4–10.5)
Chloride: 106 mEq/L (ref 96–112)
Creatinine, Ser: 1.07 mg/dL (ref 0.50–1.35)
GFR calc Af Amer: >60 mL/min (ref >60)
Sodium: 142 mEq/L (ref 135–145)

## 2010-11-11 LAB — PROTIME-INR
INR: 1.02 (ref 0.00–1.49)
Prothrombin Time: 13.6 seconds (ref 11.6–15.2)

## 2010-11-11 LAB — CBC
Hemoglobin: 13.4 g/dL (ref 13.0–17.0)
MCHC: 34.2 g/dL (ref 30.0–36.0)
RDW: 14.3 % (ref 11.5–15.5)
WBC: 5.3 10*3/uL (ref 4.0–10.5)

## 2010-11-11 LAB — POCT ACTIVATED CLOTTING TIME: Activated Clotting Time: 507 seconds

## 2010-11-12 LAB — BASIC METABOLIC PANEL
GFR calc Af Amer: >60 mL/min (ref >60)
GFR calc non Af Amer: >60 mL/min (ref >60)
Potassium: 3.9 mEq/L (ref 3.5–5.1)
Sodium: 139 mEq/L (ref 135–145)

## 2010-11-12 LAB — CBC
MCHC: 33.6 g/dL (ref 30.0–36.0)
RDW: 14.3 % (ref 11.5–15.5)
WBC: 5.8 10*3/uL (ref 4.0–10.5)

## 2010-11-19 NOTE — Cardiovascular Report (Signed)
[name], [name] NO.:  [id number]  MEDICAL RECORD NO.:  [id number]  LOCATION:  6527                         FACILITY:  [facility]  PHYSICIAN:  [name], MD DATE OF BIRTH:  05/30/1940  DATE OF PROCEDURE:  11/11/2010 DATE OF DISCHARGE:                           CARDIAC CATHETERIZATION   PROCEDURE PERFORMED:  PTCA and stenting of the proximal ostial LAD and mid LAD with implantation of a non drug-eluting 4.0 x 26 mm Integrity stent.  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  He had a cardiac catheterization for an abnormal stress test, and he underwent diagnostic cardiac catheterization on November 04, 2010, revealing a high- grade proximal LAD stenosis.  But because of presence of severe left ventricular outflow tract pressure gradient, I initially wanted to consider surgical options.  After discussions with Dr. Sherryl Manges and also with Dr. Donalee Citrin at [facility], it was felt that his symptoms of shortness of breath were probably more related to high-grade proximal LAD stenosis.  Hence, it was opined that we should proceed with angioplasty of the same.  Hence, he was brought for elective angioplasty of the same.  The patient also has chronic thrombocytopenia.  I tried to put him on Brilinta, but he within 2 days of Brilinta, he had developed significant petechiae hence, I did not feel he would be at a good long-term candidate for either Brilinta or any dual antiplatelet therapy.  Hence, I proceeded to implant a non drug-eluting stent.  ANGIOGRAPHIC DATA:  Briefly, left main coronary artery is a large vessel.  It is smooth and normal.  It actually trifurcates into LAD, circumflex, and ramus intermediate branch.  LAD.  LAD is a very large-caliber vessel.  Proximal segment of  the LAD all the way extending to the ostium at a high-grade 95% hazy stenosis.  Ramus intermediate:  Ramus intermedius is a large-caliber vessel which is smooth but with mild luminal irregularity.  Circumflex:  Circumflex is a large-caliber vessel.  It gives origin to small AV groove branch and a large obtuse marginal one which is smooth with very minimal luminal irregularity.  INTERVENTION DATA:  Very difficult to do balloon angioplasty for predilatation.  There was watermelon seed effect, hence a 3.0 x 10 mm cutting balloon was utilized and multiple arthrotomies were made in the proximal LAD.  This was followed by implantation of a long 4.0 x 26 mm non drug-eluting Integrity stent which was deployed at 9 atmospheric pressure and postdilated with a 4.0 x 21 mm Sprinter  at 16 atmospheric pressure for 40 seconds.  Poststent implantation angiography revealed 0% residual stenosis with excellent brisk TIMI 3 flow without evidence of edge dissection.  There was no compromise of the ramus intermediate branch.  RECOMMENDATIONS:  The patient will be continued on Plavix for at least a period of 3 months along with the aspirin.  I would like to continue the Plavix at least for a period of the year given ostial proximal LAD stenosis.  Hopefully, we will have significant improvement in his dyspnea symptoms.  We will continue to monitor his left ventricle outflow tract obstruction clinically.  TECHNICAL PROCEDURE:  Under sterile precautions using a 6-French right radial access, a 6-French [place] #4 guide catheter was utilized to engage left main coronary artery.  Angiography was performed.  Using Intuition guidewire, I was able to cross the LAD.  I initially tried to predilate with a 3.0 x 15 mm Sprinter Legend because of inability to do so because of watermelon seed effect, I used a 3.0 x 10 mm cutting balloon and 2 cuts at 10 atmospheric pressure for 1 minute each was performed.  This was  followed by implantation of a non drug-eluting 4.0 x 26 mm Integrity stent which was deployed at 9 atmospheric pressure for a minute followed by postdilatation with 4.0 x 21-mm [place] Sprinter balloon at 16 atmospheric pressure for 33 seconds and a 10 atmospheric pressure for 11 seconds followed by angiography.  Intracoronary nitroglycerin was also administered.  Angiography revealed excellent results.  The guidewire was withdrawn, angiography repeated.  Guide catheter disengaged and pulled out of the body over a J-wire.  The patient tolerated the procedure well.  No immediate complication were noted.     [name], MD     JRG/MEDQ  D:  11/11/2010  T:  11/11/2010  Job:  [id number]  cc:   Nadara Eaton  Electronically Signed by Yates Decamp MD on 11/19/2010 06:46:43 PM

## 2010-11-19 NOTE — Discharge Summary (Signed)
  [name], STICKELS NO.:  [id number]  MEDICAL RECORD NO.:  [id number]  LOCATION:  6527                         FACILITY:  [facility]  PHYSICIAN:  [name], MD DATE OF BIRTH:  04-30-1940  DATE OF ADMISSION:  11/11/2010 DATE OF DISCHARGE:  11/12/2010                              DISCHARGE SUMMARY   DISCHARGE DIAGNOSES: 1. Atherosclerotic coronary artery disease status post successful     percutaneous transluminal coronary angioplasty and stenting of the     ostial and mid left anterior descending with implantation of a     nondrug-eluting 4.0 x 26 mm Integrity stent for a high-grade     stenosis. 2. Chronic thrombocytopenia. 3. Hypertension. 4. Murmur secondary to left ventricular outflow tract obstruction from     basal septal hypertrophy pain (not hypertrophic cardiomyopathy).  RECOMMENDATIONS:  The patient will be discharged home.  DISCHARGE MEDICATIONS: 1. Metoprolol 50 mg p.o. b.i.d.2. Pravachol 40 mg p.o. nightly. 3. Multivitamin 1 p.o. daily. 4. Aspirin 81 mg p.o. daily. 5. Glucosamine sulfate 1000 mg p.o. daily. 6. Vitamin C 5 mg p.o. daily. 7. Plavix 75 mg p.o. daily. 8. Allegra 1 p.o. daily p.r.n.  HISTORY:  [name] is a 70 year old gentleman with a history of known coronary artery disease.  She had severe left ventricular outflow tract obstruction by cardiac catheterization that was done recently about a week ago and was felt to be not due to hypertrophic cardiomyopathy, but could be a variant of hypertrophic cardiomyopathy, but had significant basal septal hypertrophy.  He also has mild aortic stenosis and mild aortic regurgitation.  He has prior history of endocarditis.  He was admitted to the hospital for elective angioplasty of a high-grade stenosis in the LAD.  The patient underwent successful angioplasty to his proximal and mid LAD with implantation of long 4.0 x 26-mm nondrug-eluting stent for a high- grade 90% stenosis,  which was reduced to 0%.  There was excellent results were evident.  The patient remained stable the following day with normal physical examination and normal radial artery site.  He was felt stable to be discharged home.  He will follow up with me in the outpatient basis.  He will need dual antiplatelet therapy for at least a period of 3 months, if possible given for a period of a year given his proximal LAD stenting.  Drug-eluting stent was not utilized as platelet count has been well and I initially tried Brilinta,  but he had developed a petechial rash at home.     [name], MD     JRG/MEDQ  D:  11/12/2010  T:  11/12/2010  Job:  [id number]  cc:   Juline Patch, M.D.  Electronically Signed by Yates Decamp MD on 11/19/2010 06:46:35 PM

## 2010-11-19 NOTE — Cardiovascular Report (Signed)
  [name], HIRES NO.:  [id number]  MEDICAL RECORD NO.:  [id number]  LOCATION:  [facility]                         FACILITY:  [facility]  PHYSICIAN:  [name], MD DATE OF BIRTH:  02/14/1941  DATE OF PROCEDURE:  11/04/2010 DATE OF DISCHARGE:                           CARDIAC CATHETERIZATION   ADDENDUM  I discussed the case with Dr. Sherryl Manges and also discussed the case and clinical findings with Dr. Donalee Citrin at [facility].  At this point given the fact that the patient is essentially asymptomatic from basal septal hypertrophy and left ventricular outflow tract obstruction, the plan is to proceed with angioplasty to his LAD as it is high-grade stenosis.  Once the angioplasty is done, Dr. Donalee Citrin did recommend that we will follow him up symptomatically. Myomectomy and also potentially aortic valve replacement would be a very expensive and major surgery.  At this point, the plan is to proceed with angioplasty to the LAD and clinically observe and manage him from the basal septal hypertrophy and left ventricular outflow tract obstruction. As there is no significant aortic regurgitation, as there is no significant mitral regurgitation, at this point we will manage conservatively.  Consideration can be given for disopyramide.  I will discuss further with Dr. Berton Mount regarding the same.     [name], MD     JRG/MEDQ  D:  11/04/2010  T:  11/05/2010  Job:  [id number]  Electronically Signed by Yates Decamp MD on 11/19/2010 06:47:02 PM
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
## 2010-12-29 ENCOUNTER — Encounter (HOSPITAL_COMMUNITY)
Admission: RE | Admit: 2010-12-29 | Discharge: 2010-12-29 | Disposition: A | Discharge status: Home / Self Care | Payer: Medicare Other | Source: Ambulatory Visit | Attending: Cardiology | Admitting: Cardiology

## 2010-12-29 DIAGNOSIS — Z79899 Other long term (current) drug therapy: Secondary | ICD-10-CM | POA: Insufficient documentation

## 2010-12-29 DIAGNOSIS — I517 Cardiomegaly: Secondary | ICD-10-CM | POA: Insufficient documentation

## 2010-12-29 DIAGNOSIS — I10 Essential (primary) hypertension: Secondary | ICD-10-CM | POA: Insufficient documentation

## 2010-12-29 DIAGNOSIS — I2510 Atherosclerotic heart disease of native coronary artery without angina pectoris: Secondary | ICD-10-CM | POA: Insufficient documentation

## 2010-12-29 DIAGNOSIS — Z9861 Coronary angioplasty status: Secondary | ICD-10-CM | POA: Insufficient documentation

## 2010-12-29 DIAGNOSIS — Z5189 Encounter for other specified aftercare: Secondary | ICD-10-CM | POA: Insufficient documentation

## 2010-12-29 DIAGNOSIS — I359 Nonrheumatic aortic valve disorder, unspecified: Secondary | ICD-10-CM | POA: Insufficient documentation

## 2010-12-31 ENCOUNTER — Encounter (HOSPITAL_COMMUNITY): Payer: Medicare Other

## 2011-01-02 ENCOUNTER — Encounter (HOSPITAL_COMMUNITY): Payer: Medicare Other

## 2011-01-05 ENCOUNTER — Encounter (HOSPITAL_COMMUNITY): Payer: Medicare Other | Attending: Cardiology

## 2011-01-05 DIAGNOSIS — I359 Nonrheumatic aortic valve disorder, unspecified: Secondary | ICD-10-CM | POA: Insufficient documentation

## 2011-01-05 DIAGNOSIS — Z9861 Coronary angioplasty status: Secondary | ICD-10-CM | POA: Insufficient documentation

## 2011-01-05 DIAGNOSIS — Z79899 Other long term (current) drug therapy: Secondary | ICD-10-CM | POA: Insufficient documentation

## 2011-01-05 DIAGNOSIS — Z5189 Encounter for other specified aftercare: Secondary | ICD-10-CM | POA: Insufficient documentation

## 2011-01-05 DIAGNOSIS — I10 Essential (primary) hypertension: Secondary | ICD-10-CM | POA: Insufficient documentation

## 2011-01-05 DIAGNOSIS — I2510 Atherosclerotic heart disease of native coronary artery without angina pectoris: Secondary | ICD-10-CM | POA: Insufficient documentation

## 2011-01-05 DIAGNOSIS — I517 Cardiomegaly: Secondary | ICD-10-CM | POA: Insufficient documentation

## 2011-01-07 ENCOUNTER — Encounter (HOSPITAL_COMMUNITY): Payer: Medicare Other

## 2011-01-09 ENCOUNTER — Encounter (HOSPITAL_COMMUNITY): Payer: Medicare Other

## 2011-01-12 ENCOUNTER — Encounter (HOSPITAL_COMMUNITY): Payer: Medicare Other

## 2011-01-14 ENCOUNTER — Encounter (HOSPITAL_COMMUNITY): Payer: Medicare Other

## 2011-01-16 ENCOUNTER — Encounter (HOSPITAL_COMMUNITY): Payer: Medicare Other

## 2011-01-19 ENCOUNTER — Encounter (HOSPITAL_COMMUNITY): Payer: Medicare Other

## 2011-01-21 ENCOUNTER — Encounter (HOSPITAL_COMMUNITY): Payer: Medicare Other

## 2011-01-23 ENCOUNTER — Encounter (HOSPITAL_COMMUNITY): Payer: Medicare Other

## 2011-01-26 ENCOUNTER — Encounter (HOSPITAL_COMMUNITY): Payer: Medicare Other

## 2011-01-28 ENCOUNTER — Encounter (HOSPITAL_COMMUNITY): Payer: Medicare Other

## 2011-01-30 ENCOUNTER — Encounter (HOSPITAL_COMMUNITY): Payer: Medicare Other

## 2011-02-02 ENCOUNTER — Encounter (HOSPITAL_COMMUNITY): Payer: Medicare Other

## 2011-02-04 ENCOUNTER — Encounter (HOSPITAL_COMMUNITY): Payer: Medicare Other

## 2011-02-06 ENCOUNTER — Encounter (HOSPITAL_COMMUNITY): Payer: Medicare Other

## 2011-02-06 DIAGNOSIS — I517 Cardiomegaly: Secondary | ICD-10-CM | POA: Insufficient documentation

## 2011-02-06 DIAGNOSIS — Z79899 Other long term (current) drug therapy: Secondary | ICD-10-CM | POA: Insufficient documentation

## 2011-02-06 DIAGNOSIS — Z5189 Encounter for other specified aftercare: Secondary | ICD-10-CM | POA: Insufficient documentation

## 2011-02-06 DIAGNOSIS — I2510 Atherosclerotic heart disease of native coronary artery without angina pectoris: Secondary | ICD-10-CM | POA: Insufficient documentation

## 2011-02-06 DIAGNOSIS — Z9861 Coronary angioplasty status: Secondary | ICD-10-CM | POA: Insufficient documentation

## 2011-02-06 DIAGNOSIS — I10 Essential (primary) hypertension: Secondary | ICD-10-CM | POA: Insufficient documentation

## 2011-02-06 DIAGNOSIS — I359 Nonrheumatic aortic valve disorder, unspecified: Secondary | ICD-10-CM | POA: Insufficient documentation

## 2011-02-09 ENCOUNTER — Encounter (HOSPITAL_COMMUNITY): Payer: Medicare Other

## 2011-02-11 ENCOUNTER — Encounter (HOSPITAL_COMMUNITY): Payer: Medicare Other

## 2011-02-13 ENCOUNTER — Encounter (HOSPITAL_COMMUNITY): Payer: Medicare Other

## 2011-02-16 ENCOUNTER — Encounter (HOSPITAL_COMMUNITY): Payer: Medicare Other

## 2011-02-18 ENCOUNTER — Encounter (HOSPITAL_COMMUNITY): Payer: Medicare Other

## 2011-02-20 ENCOUNTER — Encounter (HOSPITAL_COMMUNITY)
Admission: RE | Admit: 2011-02-20 | Discharge: 2011-02-20 | Disposition: A | Discharge status: Home / Self Care | Payer: Medicare Other | Source: Ambulatory Visit | Attending: Cardiology | Admitting: Cardiology

## 2011-02-23 ENCOUNTER — Encounter (HOSPITAL_COMMUNITY)
Admission: RE | Admit: 2011-02-23 | Discharge: 2011-02-23 | Disposition: A | Discharge status: Home / Self Care | Payer: Medicare Other | Source: Ambulatory Visit | Attending: Cardiology | Admitting: Cardiology

## 2011-02-25 ENCOUNTER — Encounter (HOSPITAL_COMMUNITY)
Admission: RE | Admit: 2011-02-25 | Discharge: 2011-02-25 | Disposition: A | Discharge status: Home / Self Care | Payer: Medicare Other | Source: Ambulatory Visit | Attending: Cardiology | Admitting: Cardiology

## 2011-02-27 ENCOUNTER — Encounter (HOSPITAL_COMMUNITY): Payer: Medicare Other

## 2011-03-02 ENCOUNTER — Encounter (HOSPITAL_COMMUNITY)
Admission: RE | Admit: 2011-03-02 | Discharge: 2011-03-02 | Disposition: A | Discharge status: Home / Self Care | Payer: Medicare Other | Source: Ambulatory Visit | Attending: Cardiology | Admitting: Cardiology

## 2011-03-04 ENCOUNTER — Encounter (HOSPITAL_COMMUNITY)
Admission: RE | Admit: 2011-03-04 | Discharge: 2011-03-04 | Disposition: A | Discharge status: Home / Self Care | Payer: Medicare Other | Source: Ambulatory Visit | Attending: Cardiology | Admitting: Cardiology

## 2011-03-06 ENCOUNTER — Encounter (HOSPITAL_COMMUNITY)
Admission: RE | Admit: 2011-03-06 | Discharge: 2011-03-06 | Disposition: A | Discharge status: Home / Self Care | Payer: Medicare Other | Source: Ambulatory Visit | Attending: Cardiology | Admitting: Cardiology

## 2011-03-09 ENCOUNTER — Encounter (HOSPITAL_COMMUNITY)
Admission: RE | Admit: 2011-03-09 | Discharge: 2011-03-09 | Disposition: A | Discharge status: Home / Self Care | Payer: Medicare Other | Source: Ambulatory Visit | Attending: Cardiology | Admitting: Cardiology

## 2011-03-09 DIAGNOSIS — I2510 Atherosclerotic heart disease of native coronary artery without angina pectoris: Secondary | ICD-10-CM | POA: Insufficient documentation

## 2011-03-09 DIAGNOSIS — I517 Cardiomegaly: Secondary | ICD-10-CM | POA: Insufficient documentation

## 2011-03-09 DIAGNOSIS — I10 Essential (primary) hypertension: Secondary | ICD-10-CM | POA: Insufficient documentation

## 2011-03-09 DIAGNOSIS — Z5189 Encounter for other specified aftercare: Secondary | ICD-10-CM | POA: Insufficient documentation

## 2011-03-09 DIAGNOSIS — I359 Nonrheumatic aortic valve disorder, unspecified: Secondary | ICD-10-CM | POA: Insufficient documentation

## 2011-03-09 DIAGNOSIS — Z9861 Coronary angioplasty status: Secondary | ICD-10-CM | POA: Insufficient documentation

## 2011-03-09 DIAGNOSIS — Z79899 Other long term (current) drug therapy: Secondary | ICD-10-CM | POA: Insufficient documentation

## 2011-03-11 ENCOUNTER — Encounter (HOSPITAL_COMMUNITY)
Admission: RE | Admit: 2011-03-11 | Discharge: 2011-03-11 | Disposition: A | Discharge status: Home / Self Care | Payer: Medicare Other | Source: Ambulatory Visit | Attending: Cardiology | Admitting: Cardiology

## 2011-03-13 ENCOUNTER — Encounter (HOSPITAL_COMMUNITY)
Admission: RE | Admit: 2011-03-13 | Discharge: 2011-03-13 | Disposition: A | Discharge status: Home / Self Care | Payer: Medicare Other | Source: Ambulatory Visit | Attending: Cardiology | Admitting: Cardiology

## 2011-03-16 ENCOUNTER — Encounter (HOSPITAL_COMMUNITY)
Admission: RE | Admit: 2011-03-16 | Discharge: 2011-03-16 | Disposition: A | Discharge status: Home / Self Care | Payer: Medicare Other | Source: Ambulatory Visit | Attending: Cardiology | Admitting: Cardiology

## 2011-03-18 ENCOUNTER — Encounter (HOSPITAL_COMMUNITY)
Admission: RE | Admit: 2011-03-18 | Discharge: 2011-03-18 | Disposition: A | Discharge status: Home / Self Care | Payer: Medicare Other | Source: Ambulatory Visit | Attending: Cardiology | Admitting: Cardiology

## 2011-03-20 ENCOUNTER — Encounter (HOSPITAL_COMMUNITY)
Admission: RE | Admit: 2011-03-20 | Discharge: 2011-03-20 | Disposition: A | Discharge status: Home / Self Care | Payer: Medicare Other | Source: Ambulatory Visit | Attending: Cardiology | Admitting: Cardiology

## 2011-03-23 ENCOUNTER — Encounter (HOSPITAL_COMMUNITY)
Admission: RE | Admit: 2011-03-23 | Discharge: 2011-03-23 | Disposition: A | Discharge status: Home / Self Care | Payer: Medicare Other | Source: Ambulatory Visit | Attending: Cardiology | Admitting: Cardiology

## 2011-03-25 ENCOUNTER — Encounter (HOSPITAL_COMMUNITY)
Admission: RE | Admit: 2011-03-25 | Discharge: 2011-03-25 | Disposition: A | Discharge status: Home / Self Care | Payer: Medicare Other | Source: Ambulatory Visit | Attending: Cardiology | Admitting: Cardiology

## 2011-03-25 NOTE — Progress Notes (Addendum)
Cardiac Rehabilitation Program Progress Report   Orientation:  12/25/2010 Graduate Date:  03/25/2011  Discharge Date:   # of sessions completed: 36/36  Cardiologist: Jacinto Halim Family MD:  Krista Blue Time:  1115  A.  Exercise Program:  Tolerates exercise @ 3.4 METS for 30 minutes, Bike Test Results:  Pre: 0.84 miles and Post: 0.97 miles, Improved functional capacity  25 %, Improved  muscular strength  11.3 %, Improved  flexibility 25 %, Improved education score 13 % and Discharged to home exercise program.  Anticipated compliance:  good  B.  Mental Health:  Good mental attitude and Quality of Life (QOL)  changes:  Overall  4.7 %, Health/Functioning -0.6 %, Socioeconomics 2.6 %, Psych/Spiritual 16.7 %, Family -2.8 %    C.  Education/Instruction/Skills  Accurately checks own pulse.  Rest:  76  Exercise:  144, Knows THR for exercise, Uses Perceived Exertion Scale and/or Dyspnea Scale and Attended 14/13 education classes  Home exercise given: 01/07/2011  D.  Nutrition/Weight Control/Body Composition:  Adherence to prescribed nutrition program: good , % Body Fat  28.6 and Patient has lost 0.5 kg BMI 27.0  *This section completed by Mickle Plumb, Andres Shad, RD, LDN, CDE  E.  Blood Lipids    Lab Results  Component Value Date   CHOL  Value: 136        ATP III CLASSIFICATION:  <200     mg/dL   Desirable  045-409  mg/dL   Borderline High  >=811    mg/dL   High        12/19/7827     Lab Results  Component Value Date   TRIG 44 04/15/2010     Lab Results  Component Value Date   HDL 37* 04/15/2010     Lab Results  Component Value Date   CHOLHDL 3.7 04/15/2010     No results found for this basename: LDLDIRECT      F.  Lifestyle Changes:  Making positive lifestyle changes  G.  Symptoms noted with exercise:  Asymptomatic  Report Completed By:  Hazle Nordmann   Comments:  Pt did well in program, progressing from 2.5 METs to 3.4 METs.  Pt plans to continue  to exercise by using treadmill and bike at home.  At d/c pt rhythm was sinus.      I agree with the above assessment of this pt.  Thank you for the referral of this delightful patient. Karlene Lineman RN

## 2011-03-27 ENCOUNTER — Encounter (HOSPITAL_COMMUNITY): Payer: Medicare Other

## 2011-03-30 ENCOUNTER — Encounter (HOSPITAL_COMMUNITY): Payer: Medicare Other

## 2011-04-01 ENCOUNTER — Encounter (HOSPITAL_COMMUNITY): Payer: Medicare Other

## 2011-04-03 ENCOUNTER — Encounter (HOSPITAL_COMMUNITY): Payer: Medicare Other

## 2011-04-24 ENCOUNTER — Encounter (HOSPITAL_COMMUNITY): Payer: Self-pay

## 2011-05-26 DIAGNOSIS — E559 Vitamin D deficiency, unspecified: Secondary | ICD-10-CM | POA: Diagnosis not present

## 2011-05-26 DIAGNOSIS — R5381 Other malaise: Secondary | ICD-10-CM | POA: Diagnosis not present

## 2011-05-26 DIAGNOSIS — Z0000 Encounter for general adult medical examination without abnormal findings: Secondary | ICD-10-CM | POA: Diagnosis not present

## 2011-05-28 DIAGNOSIS — Z9861 Coronary angioplasty status: Secondary | ICD-10-CM | POA: Diagnosis not present

## 2011-05-28 DIAGNOSIS — I2510 Atherosclerotic heart disease of native coronary artery without angina pectoris: Secondary | ICD-10-CM | POA: Diagnosis not present

## 2011-05-28 DIAGNOSIS — I10 Essential (primary) hypertension: Secondary | ICD-10-CM | POA: Diagnosis not present

## 2011-05-28 DIAGNOSIS — R0602 Shortness of breath: Secondary | ICD-10-CM | POA: Diagnosis not present

## 2011-05-28 DIAGNOSIS — I359 Nonrheumatic aortic valve disorder, unspecified: Secondary | ICD-10-CM | POA: Diagnosis not present

## 2011-09-07 DIAGNOSIS — G4733 Obstructive sleep apnea (adult) (pediatric): Secondary | ICD-10-CM | POA: Diagnosis not present

## 2011-09-09 DIAGNOSIS — I2510 Atherosclerotic heart disease of native coronary artery without angina pectoris: Secondary | ICD-10-CM | POA: Diagnosis not present

## 2011-09-09 DIAGNOSIS — R0602 Shortness of breath: Secondary | ICD-10-CM | POA: Diagnosis not present

## 2011-09-09 DIAGNOSIS — I10 Essential (primary) hypertension: Secondary | ICD-10-CM | POA: Diagnosis not present

## 2011-09-17 DIAGNOSIS — Z0000 Encounter for general adult medical examination without abnormal findings: Secondary | ICD-10-CM | POA: Diagnosis not present

## 2011-09-17 DIAGNOSIS — I2510 Atherosclerotic heart disease of native coronary artery without angina pectoris: Secondary | ICD-10-CM | POA: Diagnosis not present

## 2011-09-17 DIAGNOSIS — E559 Vitamin D deficiency, unspecified: Secondary | ICD-10-CM | POA: Diagnosis not present

## 2011-09-17 DIAGNOSIS — E7800 Pure hypercholesterolemia, unspecified: Secondary | ICD-10-CM | POA: Diagnosis not present

## 2011-09-18 DIAGNOSIS — I2510 Atherosclerotic heart disease of native coronary artery without angina pectoris: Secondary | ICD-10-CM | POA: Diagnosis not present

## 2011-09-18 DIAGNOSIS — R0602 Shortness of breath: Secondary | ICD-10-CM | POA: Diagnosis not present

## 2011-09-18 DIAGNOSIS — I10 Essential (primary) hypertension: Secondary | ICD-10-CM | POA: Diagnosis not present

## 2011-10-15 DIAGNOSIS — R0602 Shortness of breath: Secondary | ICD-10-CM | POA: Diagnosis not present

## 2011-10-15 DIAGNOSIS — R9439 Abnormal result of other cardiovascular function study: Secondary | ICD-10-CM | POA: Diagnosis not present

## 2011-10-15 DIAGNOSIS — I2510 Atherosclerotic heart disease of native coronary artery without angina pectoris: Secondary | ICD-10-CM | POA: Diagnosis not present

## 2011-10-15 DIAGNOSIS — I359 Nonrheumatic aortic valve disorder, unspecified: Secondary | ICD-10-CM | POA: Diagnosis not present

## 2011-10-15 DIAGNOSIS — Z9861 Coronary angioplasty status: Secondary | ICD-10-CM | POA: Diagnosis not present

## 2011-10-21 DIAGNOSIS — R5383 Other fatigue: Secondary | ICD-10-CM | POA: Diagnosis not present

## 2011-10-21 DIAGNOSIS — I38 Endocarditis, valve unspecified: Secondary | ICD-10-CM | POA: Diagnosis not present

## 2011-10-21 DIAGNOSIS — R5381 Other malaise: Secondary | ICD-10-CM | POA: Diagnosis not present

## 2011-10-21 DIAGNOSIS — Z0000 Encounter for general adult medical examination without abnormal findings: Secondary | ICD-10-CM | POA: Diagnosis not present

## 2011-10-21 DIAGNOSIS — I2510 Atherosclerotic heart disease of native coronary artery without angina pectoris: Secondary | ICD-10-CM | POA: Diagnosis not present

## 2011-10-21 DIAGNOSIS — E7800 Pure hypercholesterolemia, unspecified: Secondary | ICD-10-CM | POA: Diagnosis not present

## 2011-10-29 DIAGNOSIS — R509 Fever, unspecified: Secondary | ICD-10-CM | POA: Diagnosis not present

## 2011-11-04 DIAGNOSIS — R509 Fever, unspecified: Secondary | ICD-10-CM | POA: Diagnosis not present

## 2011-11-05 DIAGNOSIS — I10 Essential (primary) hypertension: Secondary | ICD-10-CM | POA: Diagnosis not present

## 2011-11-05 DIAGNOSIS — R0602 Shortness of breath: Secondary | ICD-10-CM | POA: Diagnosis not present

## 2011-11-05 DIAGNOSIS — I2510 Atherosclerotic heart disease of native coronary artery without angina pectoris: Secondary | ICD-10-CM | POA: Diagnosis not present

## 2011-11-06 ENCOUNTER — Inpatient Hospital Stay (HOSPITAL_COMMUNITY): Payer: Medicare Other

## 2011-11-06 ENCOUNTER — Ambulatory Visit (INDEPENDENT_AMBULATORY_CARE_PROVIDER_SITE_OTHER): Payer: Medicare Other | Admitting: Internal Medicine

## 2011-11-06 ENCOUNTER — Inpatient Hospital Stay (HOSPITAL_COMMUNITY)
Admission: AD | Admit: 2011-11-06 | Discharge: 2011-11-12 | DRG: 290 | Disposition: A | Discharge status: Home / Self Care | Payer: Medicare Other | Source: Ambulatory Visit | Attending: Internal Medicine | Admitting: Internal Medicine

## 2011-11-06 ENCOUNTER — Encounter (HOSPITAL_COMMUNITY): Payer: Self-pay | Admitting: *Deleted

## 2011-11-06 ENCOUNTER — Encounter: Payer: Self-pay | Admitting: Internal Medicine

## 2011-11-06 ENCOUNTER — Encounter (HOSPITAL_COMMUNITY)
Admission: AD | Disposition: A | Discharge status: Home / Self Care | Payer: Self-pay | Source: Ambulatory Visit | Attending: Internal Medicine

## 2011-11-06 VITALS — BP 122/71 | HR 121 | Temp 99.0°F | Wt 190.0 lb

## 2011-11-06 DIAGNOSIS — I330 Acute and subacute infective endocarditis: Secondary | ICD-10-CM | POA: Diagnosis not present

## 2011-11-06 DIAGNOSIS — Z95828 Presence of other vascular implants and grafts: Secondary | ICD-10-CM

## 2011-11-06 DIAGNOSIS — I421 Obstructive hypertrophic cardiomyopathy: Secondary | ICD-10-CM | POA: Diagnosis not present

## 2011-11-06 DIAGNOSIS — D126 Benign neoplasm of colon, unspecified: Secondary | ICD-10-CM | POA: Diagnosis present

## 2011-11-06 DIAGNOSIS — D509 Iron deficiency anemia, unspecified: Secondary | ICD-10-CM | POA: Diagnosis not present

## 2011-11-06 DIAGNOSIS — R0602 Shortness of breath: Secondary | ICD-10-CM | POA: Diagnosis not present

## 2011-11-06 DIAGNOSIS — R0609 Other forms of dyspnea: Secondary | ICD-10-CM

## 2011-11-06 DIAGNOSIS — E278 Other specified disorders of adrenal gland: Secondary | ICD-10-CM | POA: Diagnosis not present

## 2011-11-06 DIAGNOSIS — C183 Malignant neoplasm of hepatic flexure: Secondary | ICD-10-CM | POA: Diagnosis not present

## 2011-11-06 DIAGNOSIS — M549 Dorsalgia, unspecified: Secondary | ICD-10-CM | POA: Diagnosis present

## 2011-11-06 DIAGNOSIS — M47817 Spondylosis without myelopathy or radiculopathy, lumbosacral region: Secondary | ICD-10-CM | POA: Diagnosis not present

## 2011-11-06 DIAGNOSIS — IMO0002 Reserved for concepts with insufficient information to code with codable children: Secondary | ICD-10-CM | POA: Diagnosis present

## 2011-11-06 DIAGNOSIS — B952 Enterococcus as the cause of diseases classified elsewhere: Secondary | ICD-10-CM | POA: Diagnosis present

## 2011-11-06 DIAGNOSIS — E785 Hyperlipidemia, unspecified: Secondary | ICD-10-CM | POA: Diagnosis not present

## 2011-11-06 DIAGNOSIS — Z8614 Personal history of Methicillin resistant Staphylococcus aureus infection: Secondary | ICD-10-CM

## 2011-11-06 DIAGNOSIS — N400 Enlarged prostate without lower urinary tract symptoms: Secondary | ICD-10-CM | POA: Diagnosis not present

## 2011-11-06 DIAGNOSIS — I10 Essential (primary) hypertension: Secondary | ICD-10-CM | POA: Diagnosis present

## 2011-11-06 DIAGNOSIS — D490 Neoplasm of unspecified behavior of digestive system: Secondary | ICD-10-CM | POA: Diagnosis not present

## 2011-11-06 DIAGNOSIS — Z7982 Long term (current) use of aspirin: Secondary | ICD-10-CM | POA: Diagnosis not present

## 2011-11-06 DIAGNOSIS — Q244 Congenital subaortic stenosis: Secondary | ICD-10-CM

## 2011-11-06 DIAGNOSIS — K6389 Other specified diseases of intestine: Secondary | ICD-10-CM | POA: Diagnosis not present

## 2011-11-06 DIAGNOSIS — I38 Endocarditis, valve unspecified: Secondary | ICD-10-CM

## 2011-11-06 DIAGNOSIS — M5137 Other intervertebral disc degeneration, lumbosacral region: Secondary | ICD-10-CM | POA: Diagnosis not present

## 2011-11-06 DIAGNOSIS — R040 Epistaxis: Secondary | ICD-10-CM | POA: Diagnosis present

## 2011-11-06 DIAGNOSIS — R55 Syncope and collapse: Secondary | ICD-10-CM

## 2011-11-06 DIAGNOSIS — R9389 Abnormal findings on diagnostic imaging of other specified body structures: Secondary | ICD-10-CM

## 2011-11-06 DIAGNOSIS — I359 Nonrheumatic aortic valve disorder, unspecified: Secondary | ICD-10-CM | POA: Diagnosis present

## 2011-11-06 DIAGNOSIS — N289 Disorder of kidney and ureter, unspecified: Secondary | ICD-10-CM

## 2011-11-06 DIAGNOSIS — Z8679 Personal history of other diseases of the circulatory system: Secondary | ICD-10-CM

## 2011-11-06 DIAGNOSIS — K4090 Unilateral inguinal hernia, without obstruction or gangrene, not specified as recurrent: Secondary | ICD-10-CM | POA: Diagnosis present

## 2011-11-06 DIAGNOSIS — I2510 Atherosclerotic heart disease of native coronary artery without angina pectoris: Secondary | ICD-10-CM | POA: Diagnosis present

## 2011-11-06 DIAGNOSIS — M5126 Other intervertebral disc displacement, lumbar region: Secondary | ICD-10-CM | POA: Diagnosis not present

## 2011-11-06 DIAGNOSIS — K047 Periapical abscess without sinus: Secondary | ICD-10-CM

## 2011-11-06 DIAGNOSIS — K4020 Bilateral inguinal hernia, without obstruction or gangrene, not specified as recurrent: Secondary | ICD-10-CM | POA: Diagnosis not present

## 2011-11-06 DIAGNOSIS — R932 Abnormal findings on diagnostic imaging of liver and biliary tract: Secondary | ICD-10-CM | POA: Diagnosis not present

## 2011-11-06 DIAGNOSIS — Z09 Encounter for follow-up examination after completed treatment for conditions other than malignant neoplasm: Secondary | ICD-10-CM | POA: Diagnosis not present

## 2011-11-06 DIAGNOSIS — A491 Streptococcal infection, unspecified site: Secondary | ICD-10-CM

## 2011-11-06 DIAGNOSIS — M519 Unspecified thoracic, thoracolumbar and lumbosacral intervertebral disc disorder: Secondary | ICD-10-CM | POA: Diagnosis not present

## 2011-11-06 DIAGNOSIS — R7881 Bacteremia: Secondary | ICD-10-CM | POA: Diagnosis not present

## 2011-11-06 DIAGNOSIS — M79601 Pain in right arm: Secondary | ICD-10-CM

## 2011-11-06 DIAGNOSIS — R509 Fever, unspecified: Secondary | ICD-10-CM | POA: Diagnosis not present

## 2011-11-06 HISTORY — DX: Atherosclerotic heart disease of native coronary artery without angina pectoris: I25.10

## 2011-11-06 HISTORY — DX: Essential (primary) hypertension: I10

## 2011-11-06 HISTORY — DX: Endocarditis, valve unspecified: I38

## 2011-11-06 HISTORY — PX: TEE WITHOUT CARDIOVERSION: SHX5443

## 2011-11-06 HISTORY — DX: Cardiac murmur, unspecified: R01.1

## 2011-11-06 HISTORY — DX: Unspecified osteoarthritis, unspecified site: M19.90

## 2011-11-06 LAB — URINALYSIS, ROUTINE W REFLEX MICROSCOPIC
Glucose, UA: NEGATIVE mg/dL
Hgb urine dipstick: NEGATIVE
Leukocytes, UA: NEGATIVE
pH: 6 (ref 5.0–8.0)

## 2011-11-06 LAB — CBC
HCT: 30.5 % — ABNORMAL LOW (ref 39.0–52.0)
Hemoglobin: 10.4 g/dL — ABNORMAL LOW (ref 13.0–17.0)
RBC: 3.42 MIL/uL — ABNORMAL LOW (ref 4.22–5.81)

## 2011-11-06 LAB — BASIC METABOLIC PANEL
CO2: 24 mEq/L (ref 19–32)
Chloride: 102 mEq/L (ref 96–112)
Chloride: 99 mEq/L (ref 96–112)
GFR calc Af Amer: 80 mL/min — ABNORMAL LOW (ref >90)
GFR calc non Af Amer: 69 mL/min — ABNORMAL LOW (ref >90)
Glucose, Bld: 105 mg/dL — ABNORMAL HIGH (ref 70–99)
Glucose, Bld: 116 mg/dL — ABNORMAL HIGH (ref 70–99)
Potassium: 3.9 mEq/L (ref 3.5–5.3)
Potassium: 4.4 mEq/L (ref 3.5–5.1)
Sodium: 137 mEq/L (ref 135–145)
Sodium: 134 mEq/L — ABNORMAL LOW (ref 135–145)

## 2011-11-06 LAB — CBC WITH DIFFERENTIAL/PLATELET
Basophils Relative: 0 % (ref 0–1)
Hemoglobin: 10.8 g/dL — ABNORMAL LOW (ref 13.0–17.0)
Lymphocytes Relative: 8 % — ABNORMAL LOW (ref 12–46)
Lymphs Abs: 0.7 10*3/uL (ref 0.7–4.0)
Monocytes Relative: 7 % (ref 3–12)
Neutro Abs: 8.2 10*3/uL — ABNORMAL HIGH (ref 1.7–7.7)
Neutrophils Relative %: 85 % — ABNORMAL HIGH (ref 43–77)
RBC: 3.64 MIL/uL — ABNORMAL LOW (ref 4.22–5.81)
WBC: 9.6 10*3/uL (ref 4.0–10.5)

## 2011-11-06 LAB — SEDIMENTATION RATE: Sed Rate: 18 mm/hr — ABNORMAL HIGH (ref 0–16)

## 2011-11-06 SURGERY — ECHOCARDIOGRAM, TRANSESOPHAGEAL
Anesthesia: Moderate Sedation

## 2011-11-06 MED ORDER — ACETAMINOPHEN 650 MG RE SUPP: 650.0000 mg | Freq: Four times a day (QID) | RECTAL | Status: DC | PRN

## 2011-11-06 MED ORDER — DEXTROSE 5 % IV SOLN
2.0000 g | Freq: Two times a day (BID) | INTRAVENOUS | Status: DC
  Administered 2011-11-06 – 2011-11-11 (×10): 2 g via INTRAVENOUS
  Filled 2011-11-06 (×13): qty 2

## 2011-11-06 MED ORDER — VITAMIN E 180 MG (400 UNIT) PO CAPS
400.0000 [IU] | ORAL_CAPSULE | Freq: Two times a day (BID) | ORAL | Status: DC
  Administered 2011-11-06 – 2011-11-12 (×12): 400 [IU] via ORAL
  Filled 2011-11-06 (×15): qty 1

## 2011-11-06 MED ORDER — ONDANSETRON HCL 4 MG PO TABS: 4.0000 mg | ORAL_TABLET | Freq: Four times a day (QID) | ORAL | Status: DC | PRN

## 2011-11-06 MED ORDER — VANCOMYCIN HCL IN DEXTROSE 1-5 GM/200ML-% IV SOLN
1000.0000 mg | Freq: Two times a day (BID) | INTRAVENOUS | Status: DC
  Administered 2011-11-07 – 2011-11-09 (×5): 1000 mg via INTRAVENOUS
  Filled 2011-11-06 (×6): qty 200

## 2011-11-06 MED ORDER — GLUCOSAMINE-CHONDROITIN 500-400 MG PO TABS: 2.0000 | ORAL_TABLET | Freq: Two times a day (BID) | ORAL | Status: DC

## 2011-11-06 MED ORDER — SODIUM CHLORIDE 0.9 % IJ SOLN: 3.0000 mL | INTRAMUSCULAR | Status: DC | PRN

## 2011-11-06 MED ORDER — ONDANSETRON HCL 4 MG/2ML IJ SOLN: 4.0000 mg | Freq: Four times a day (QID) | INTRAMUSCULAR | Status: DC | PRN

## 2011-11-06 MED ORDER — VITAMIN D3 25 MCG (1000 UNIT) PO TABS
2000.0000 [IU] | ORAL_TABLET | Freq: Every day | ORAL | Status: DC
  Administered 2011-11-06 – 2011-11-12 (×7): 2000 [IU] via ORAL
  Filled 2011-11-06 (×7): qty 2

## 2011-11-06 MED ORDER — VANCOMYCIN HCL IN DEXTROSE 1-5 GM/200ML-% IV SOLN
1000.0000 mg | Freq: Once | INTRAVENOUS | Status: AC
  Administered 2011-11-06: 1000 mg via INTRAVENOUS
  Filled 2011-11-06: qty 200

## 2011-11-06 MED ORDER — SODIUM CHLORIDE 0.45 % IV SOLN
INTRAVENOUS | Status: DC
  Administered 2011-11-06: 500 mL via INTRAVENOUS

## 2011-11-06 MED ORDER — MIDAZOLAM HCL 10 MG/2ML IJ SOLN
INTRAMUSCULAR | Status: AC
  Filled 2011-11-06: qty 2

## 2011-11-06 MED ORDER — MORPHINE SULFATE 2 MG/ML IJ SOLN: 2.0000 mg | INTRAMUSCULAR | Status: DC | PRN

## 2011-11-06 MED ORDER — VITAMIN C 500 MG PO TABS
500.0000 mg | ORAL_TABLET | Freq: Two times a day (BID) | ORAL | Status: DC
  Administered 2011-11-06 – 2011-11-12 (×12): 500 mg via ORAL
  Filled 2011-11-06 (×14): qty 1

## 2011-11-06 MED ORDER — ADULT MULTIVITAMIN W/MINERALS CH
1.0000 | ORAL_TABLET | Freq: Every day | ORAL | Status: DC
  Administered 2011-11-06 – 2011-11-12 (×7): 1 via ORAL
  Filled 2011-11-06 (×7): qty 1

## 2011-11-06 MED ORDER — SODIUM CHLORIDE 0.45 % IV SOLN
INTRAVENOUS | Status: DC
  Administered 2011-11-06: 18:00:00 via INTRAVENOUS

## 2011-11-06 MED ORDER — SODIUM CHLORIDE 0.9 % IV SOLN: 250.0000 mL | INTRAVENOUS | Status: DC | PRN

## 2011-11-06 MED ORDER — METOPROLOL SUCCINATE ER 25 MG PO TB24
25.0000 mg | ORAL_TABLET | Freq: Every day | ORAL | Status: DC
  Administered 2011-11-06 – 2011-11-12 (×7): 25 mg via ORAL
  Filled 2011-11-06 (×7): qty 1

## 2011-11-06 MED ORDER — DIPHENHYDRAMINE HCL 50 MG/ML IJ SOLN
INTRAMUSCULAR | Status: AC
  Filled 2011-11-06: qty 1

## 2011-11-06 MED ORDER — DEXTROSE 5 % IV SOLN
2.0000 g | INTRAVENOUS | Status: DC
  Filled 2011-11-06: qty 2

## 2011-11-06 MED ORDER — ACETAMINOPHEN 325 MG PO TABS: 650.0000 mg | ORAL_TABLET | Freq: Four times a day (QID) | ORAL | Status: DC | PRN

## 2011-11-06 MED ORDER — OXYCODONE HCL 5 MG PO TABS: 5.0000 mg | ORAL_TABLET | ORAL | Status: DC | PRN

## 2011-11-06 MED ORDER — DOCUSATE SODIUM 100 MG PO CAPS
100.0000 mg | ORAL_CAPSULE | Freq: Two times a day (BID) | ORAL | Status: DC
  Administered 2011-11-06 – 2011-11-09 (×2): 100 mg via ORAL
  Filled 2011-11-06 (×12): qty 1

## 2011-11-06 MED ORDER — SODIUM CHLORIDE 0.9 % IJ SOLN: 3.0000 mL | Freq: Two times a day (BID) | INTRAMUSCULAR | Status: DC

## 2011-11-06 MED ORDER — SIMVASTATIN 20 MG PO TABS
20.0000 mg | ORAL_TABLET | Freq: Every day | ORAL | Status: DC
  Administered 2011-11-06 – 2011-11-11 (×6): 20 mg via ORAL
  Filled 2011-11-06 (×7): qty 1

## 2011-11-06 MED ORDER — FENTANYL CITRATE 0.05 MG/ML IJ SOLN
INTRAMUSCULAR | Status: AC
  Filled 2011-11-06: qty 2

## 2011-11-06 MED ORDER — BUTAMBEN-TETRACAINE-BENZOCAINE 2-2-14 % EX AERO
INHALATION_SPRAY | CUTANEOUS | Status: DC | PRN
  Administered 2011-11-06: 2 via TOPICAL

## 2011-11-06 MED ORDER — FENTANYL CITRATE 0.05 MG/ML IJ SOLN
INTRAMUSCULAR | Status: DC | PRN
  Administered 2011-11-06: 25 ug via INTRAVENOUS

## 2011-11-06 MED ORDER — MIDAZOLAM HCL 10 MG/2ML IJ SOLN
INTRAMUSCULAR | Status: DC | PRN
  Administered 2011-11-06: 1 mg via INTRAVENOUS
  Administered 2011-11-06: 2 mg via INTRAVENOUS

## 2011-11-06 MED ORDER — PANTOPRAZOLE SODIUM 40 MG PO TBEC
40.0000 mg | DELAYED_RELEASE_TABLET | Freq: Every day | ORAL | Status: DC
  Administered 2011-11-07 – 2011-11-12 (×6): 40 mg via ORAL
  Filled 2011-11-06 (×6): qty 1

## 2011-11-06 MED ORDER — GADOBENATE DIMEGLUMINE 529 MG/ML IV SOLN: 18.0000 mL | Freq: Once | INTRAVENOUS | Status: AC | PRN

## 2011-11-06 MED ORDER — COENZYME Q10 300 MG PO CAPS: 1.0000 | ORAL_CAPSULE | Freq: Every day | ORAL | Status: DC

## 2011-11-06 NOTE — Interval H&P Note (Signed)
History and Physical Interval Note:  11/06/2011 4:01 PM  [name]  has presented today for surgery, with the diagnosis of endocarditis  The various methods of treatment have been discussed with the patient and family. After consideration of risks, benefits and other options for treatment, the patient has consented to  Procedure(s) (LRB): TRANSESOPHAGEAL ECHOCARDIOGRAM (TEE) (N/A) as a surgical intervention .  The patient's history has been reviewed, patient examined, no change in status, stable for surgery.  I have reviewed the patient's chart and labs.  Questions were answered to the patient's satisfaction.     [name]

## 2011-11-06 NOTE — Progress Notes (Signed)
Spouse made aware of the transfer 

## 2011-11-06 NOTE — Progress Notes (Signed)
Report given to 2000 , patient is alert and oriented X4, not in any distress on transfer.

## 2011-11-06 NOTE — H&P (Signed)
PCP:   Juline Patch, MD   Chief Complaint:  Fever, chill, fatigue, SOBOE.  HPI: This is a 71 year old male, with known history of HTN, dyslipidemia, BPH, right inguinal hernia, CAD, s/p PCI, subvalvular aortic stenosis, streptococcal native valve endocarditis x 2 (S.salivarius in Jan 2012, and viridans group Streptococci in March 2012), referred for direct admission, from the ID clinic, by Dr Drue Second, infectious diseases specialist. Apparently, over the last 2 weeks, he has had fevers, up to 101.31F, intermittently, as well as chills, and as a matter of fact, the last episode was today. He has also had occasional low back pain. His PMD empirically treated him with Ciprofloxacin for a presumed UTI for 5 days, without appreciable improvement. He continues to feel fatigued, gets SOB on ambulating about a hundred feet on the flat On 7/31/123, he saw Dr Ricki Miller, his PMD for re-evaluation, blood cultures were taken, and he was given Doxycycline (now on Day#2). The patient denies any recent travel, no tickbites. Given his known history of previous endocarditis, Dr. Jacinto Halim, cardiologist performed who performed a TTE on 11/05/11, which showed EF 60-65%, mild AR, severe sub-aortic stenosis, moderate AV leaflet thickening with mild calcifications. AV leaflet mobility mildly restricted. LVOT obstruction. Also noted mild to moderate MR, mild MV leaflet thickening. Moderate TR, trave Pulmonic regurgitation. No vegetations visualized. His blood cultures from 10/07/11 grew out GPC on day #2. He is scheduled for TEE on 11/06/11.     Allergies:   Allergies  Allergen Reactions  . Penicillins Rash      Past Medical History  Diagnosis Date  . Heart murmur   . Coronary artery disease   . Endocarditis   . Arthritis     Past Surgical History  Procedure Date  . Cardiac catheterization     Prior to Admission medications   Medication Sig Start Date End Date Taking? Authorizing Provider  Ascorbic Acid (VITAMIN C) 500 MG  tablet Take 500 mg by mouth 2 (two) times daily.     Yes Historical Provider, MD  aspirin 325 MG tablet Take 325 mg by mouth every 6 (six) hours as needed. For fever/pain   Yes Historical Provider, MD  cholecalciferol (VITAMIN D) 1000 UNITS tablet Take 2,000 Units by mouth daily.    Yes Historical Provider, MD  Coenzyme Q10 (EQL COQ10) 300 MG CAPS Take 1 capsule by mouth daily.     Yes Historical Provider, MD  Ginkgo Biloba 60 MG CAPS Take 1 capsule by mouth daily.     Yes Historical Provider, MD  glucosamine-chondroitin 500-400 MG tablet Take 2 tablets by mouth 2 (two) times daily.   Yes Historical Provider, MD  HAWTHORN BERRY PO Take 1 capsule by mouth daily.    Yes Historical Provider, MD  Horse Chestnut 300 MG CAPS Take 1 capsule by mouth daily.     Yes Historical Provider, MD  metoprolol succinate (TOPROL-XL) 25 MG 24 hr tablet Take 25 mg by mouth daily.   Yes Historical Provider, MD  Multiple Vitamin (MULTIVITAMIN WITH MINERALS) TABS Take 1 tablet by mouth daily.   Yes Historical Provider, MD  omeprazole (PRILOSEC) 10 MG capsule Take 10 mg by mouth daily as needed. For upset stomach   Yes Historical Provider, MD  OVER THE COUNTER MEDICATION Take 1,000 mg by mouth daily. BILLBERRY CAPSULE 1000MG    Yes Historical Provider, MD  pravastatin (PRAVACHOL) 40 MG tablet Take 40 mg by mouth daily.   Yes Historical Provider, MD  saw palmetto 160 MG capsule Take  160 mg by mouth.   Yes Historical Provider, MD  vitamin E 400 UNIT capsule Take 400 Units by mouth 2 (two) times daily.   Yes Historical Provider, MD    Social History: Retired from [employer] about one year ago, married, has 2 offspring. He reports that he has never smoked. He has never used smokeless tobacco. His alcohol and drug histories not on file.  Family history:  Father ids deceased from heart failure at age 74 years, mother passed away at age 91 years. She was hypertensive.   Review of Systems:  As per HPI and chief complaint. Patent  has fatigue, fever, chills, headache, denies blurred vision, difficulty in speaking, dysphagia, experiences occasional twinges of chest pain, but no cough, orthopnea, paroxysmal nocturnal dyspnea, nausea, diaphoresis, abdominal pain, vomiting, diarrhea, belching, heartburn, hematemesis, melena, dysuria, nocturia, urinary frequency, hematochezia, lower extremity swelling, pain, or redness. He has had no tick bites or recent foreign travel. Gets occasional constipation. The rest of the systems review is negative.  Physical Exam:  General:  Patient does not appear to be in obvious acute distress. Alert, communicative, fully oriented, talking in complete sentences, not short of breath at rest.  HEENT:  Mild clinical pallor, no jaundice, no conjunctival injection or discharge. Hydration status appears fair. NECK:  Supple, JVP not seen, no carotid bruits, no palpable lymphadenopathy, no palpable goiter. CHEST:  Clinically clear to auscultation, no wheezes, no crackles. HEART:  Sounds 1 and 2 heard, normal, regular, systolic murmur. ABDOMEN:  Flat, soft, non-tender, no palpable organomegaly, no palpable masses, normal bowel sounds. GENITALIA:  Not examined. LOWER EXTREMITIES:  No pitting edema, palpable peripheral pulses. MUSCULOSKELETAL SYSTEM:  Generalized osteoarthritic changes, otherwise, normal. CENTRAL NERVOUS SYSTEM:  No focal neurologic deficit on gross examination.  Labs on Admission:  No results found for this or any previous visit (from the past 48 hour(s)).  Radiological Exams on Admission: No results found.  Assessment/Plan Active Problems: 1. Acute bacterial endocarditis: Patient presents with 2 weeks of feeling non-specifically unwell, with recurrent pyrexia, fatigue and diminished effort tolerance, despite empiric antibiotic treatment. Blood cultures have grown GPC, raising the suspicion of recurrent endocarditis, against a background of 2 previous episodes. He appears hemodynamically  stable at this time, although he has a low grade pyrexia. TEE has been arranged. Empiric Vancomycin/Rocephin, started, given blood culture results. Dr Judyann Munson, will provide ID consult. 2. Back pain: Clinically, he does not appear to be in acute distress from this, but an MRI of thoraco-lumbar region, would be useful to exclude seeding/diskitis.  3. HTN (hypertension)BP appears controlled at this time on preadmission antihypertensives, which we shall continue.  4. BPH (benign prostatic hyperplasia): No symptoms of prostatism at this time.  5. Subaortic stenosis: No clinical evidence of CHF or decompensation at this time. Dr Yates Decamp will provide cardiology consultation.  6. Dyslipidemia. On Statin.   Further management will depend on clinical course.    Time Spent on Admission: 45 mins.   Abner Ardis,CHRISTOPHER 11/06/2011, 3:32 PM

## 2011-11-06 NOTE — Progress Notes (Addendum)
ANTIBIOTIC CONSULT NOTE - INITIAL  Pharmacy Consult for Vancoymcyin  Indication: endocarditis  Allergies  Allergen Reactions  . Penicillins Rash    Patient Measurements: Height: 6' (182.9 cm) Weight: 190 lb (86.183 kg) IBW/kg (Calculated) : 77.6   Vital Signs: Temp: 100.4 F (38 C) (08/02 1435) Temp src: Oral (08/02 1435) BP: 126/68 mmHg (08/02 1435) Pulse Rate: 114  (08/02 1435) Intake/Output from previous day:   Intake/Output from this shift:    Labs: No results found for this basename: WBC:3,HGB:3,PLT:3,LABCREA:3,CREATININE:3 in the last 72 hours Estimated Creatinine Clearance: 67.7 ml/min (by C-G formula based on Cr of 1.13). No results found for this basename: VANCOTROUGH:2,VANCOPEAK:2,VANCORANDOM:2,GENTTROUGH:2,GENTPEAK:2,GENTRANDOM:2,TOBRATROUGH:2,TOBRAPEAK:2,TOBRARND:2,AMIKACINPEAK:2,AMIKACINTROU:2,AMIKACIN:2, in the last 72 hours   Microbiology: No results found for this or any previous visit (from the past 720 hour(s)).  Medical History: Past Medical History  Diagnosis Date  . Stroke   . Heart murmur     Assessment: 38 YOM with previous hx of streptococcal native valve endocarditis x 2, now admitted for suspected native valve endocarditis blood cultures obtained at PCP on 7/31 grew out GPC. Now to start vancomycin and rocephin for empiric treatment. Pt. Currently has fever Tm = 100.4, CBC, BMET are pending (collected during office visit toray).     Goal of Therapy:  Vancomycin trough level 15-20 mcg/ml  Plan:  - Vancomycin 1g IV x 1 for now - f/u BMET when available to determine dosing schedule.   Bayard Hugger, PharmD, BCPS  Clinical Pharmacist  Pager: [phone]  11/06/2011,3:13 PM  Addendum: Scr = 1.07, est. crcl ~ 70 ml/min, will schedule Vancomycin 1g IV Q 12hrs next dose 0600 on 8/3. Will f/u renal function and cultures, check vancomycin trough at steady state.

## 2011-11-06 NOTE — H&P (View-Only) (Signed)
INFECTIOUS DISEASES CLINIC  RFV: gram positive cocci bacteremia, concerning for subacute endocarditis  Subjective:    Patient ID: [name], male    DOB: 08/21/1940, 71 y.o.   MRN: [id number]  HPI [name] is a 71 yo Male with hx of CAD s/p PCI and subvalvular aortic stenosis who has previous hx of streptococcal native valve endocarditis x 2 (S.salivarius in Jan 2012, and viridans group Streptococci in March 2012).  Presently, He reports having fevers, up to 101.4F, intermittently for the last 2 wks. He denies rigors, chills, or nightsweats, but has occ hotflash. When his symptoms first started it was also accompanied by low back pain. He saw his PCP who empirically treated him with cipro and hydrocodone ? Possibly UTI (unclear if UA/Ur Cx ever done). The patient stated that his lab work looked like he was dehydrated. During the 5 day course of antibiotics, he did not feel appreciably better. His back pain did remit, after having more regular bowel movements. Since he was having persistent fevers, he went to his PCP on 7/31 for re-evaluation. At this time blood work including blood cultures were taken, and he was given doxycycline (now on Day#2) for his fivers. The patient denies any recent travel, no tickbites. He was also seen by his cardiologist, Dr. Ganji, who performed a repeat TTE for concern of endocarditis. His TTE on 8/1 revealed EF 60-65%, mild AR, with sevre sub-aortic stenosis, mod AV leaflet thickening with mild calcifications. AVleaflet mobility mildly restricted. LVOT obstruction. Also noted mild to mod MR, mild MV leaflet thickening. Mod TR, trave Pulmonic regurgitation. No vegetations visualized on TTE. Concurrently his blood cultures from 7/31 grew out GPC on day #2.   [name] was referred back to ID clinic for evaluation of recurrent subacute NVE.  1st episode of endocarditis: had onset of fevers in November, started on moxifloxacin which temporarily improved his  fevers, but presented with syncope while driving. Strep salivarius with elevated PCN MIC. Started initially on vanco and gent -> but discharged on vanco x 4 wks.   2nd episode of endocarditis- had recurrent fever, work up revealed viridans strep but different PCN MIC, thus thought to be a 2nd pathogen. His work up showed possible dental abscess as source of infection, had root canal toward the end of 6 wks of ceftriaxone.  ROS: The patient denies palpations, orthopnea, DOE, dizziness, syncope, no weight loss. Has occasional chest pressure from GERD/relieved by belching. He subscribes fever, and fatigue. He has slight pain behind right knee which he thinks started after he was sleeping in a lounger when his wife was hospitalized. He states that his back pain is improved but not completely resolved. Other systems negative.  Current micro: [facility] blood cx on 7/31: GPCs thus far  Historic micro: 1/10 blood 2/2 streptococcus salivarius ( ctx 0.25, levo 1.0, PCN 0.75, vanco 1.0) 1/14 blood NGTD  3/23 blood 2/2 viridans group streptococcus - no further id ( PCN 0.094) 3/25 blood NGTD  Allergies  Allergen Reactions  . Penicillins   - rash, but denies hives or shortness of breath. - he has tolerated cephalexin and ceftriaxone without difficulty Current Outpatient Prescriptions on File Prior to Visit  Medication Sig Dispense Refill  . Ascorbic Acid (VITAMIN C) 500 MG tablet Take 500 mg by mouth 2 (two) times daily.        . aspirin 325 MG tablet Take 325 mg by mouth daily.        . Bilberry 60 MG   CAPS Take 1 capsule by mouth at bedtime. 1000 mg daily      . cholecalciferol (VITAMIN D) 1000 UNITS tablet Take 1,000 Units by mouth daily. Vitamin D/ K2 2000 units daily      . Coenzyme Q10 (EQL COQ10) 300 MG CAPS Take 1 capsule by mouth daily.        . fexofenadine (ALLEGRA) 180 MG tablet Take 180 mg by mouth daily as needed.      . Ginkgo Biloba 60 MG CAPS Take 1 capsule by mouth daily.        .  glucosamine-chondroitin 500-400 MG tablet Take 2 tablets by mouth 2 (two) times daily.      . HAWTHORN BERRY PO        . Horse Chestnut 300 MG CAPS Take 1 capsule by mouth daily.        . metoprolol (LOPRESSOR) 50 MG tablet Take 75 mg by mouth 2 (two) times daily. (1.5 tablets)       . Multiple Vitamin (MULTIVITAMIN) tablet Take 1 tablet by mouth daily.        . omeprazole (PRILOSEC) 10 MG capsule Take 10 mg by mouth daily.        . pravastatin (PRAVACHOL) 40 MG tablet Take 40 mg by mouth daily.      . saw palmetto 160 MG capsule Take 160 mg by mouth.      . vitamin E 400 UNIT capsule Take 400 Units by mouth 2 (two) times daily.       Active Ambulatory Problems    Diagnosis Date Noted  . ANEMIA, IRON DEFICIENCY 05/06/2010  . ENDOCARDITIS, BACTERIAL, SUBACUTE 05/07/2010  . 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 SYNCOPE 05/06/2010  . DYSPNEA ON EXERTION 05/06/2010  . ECHOCARDIOGRAM, ABNORMAL 05/06/2010  . HEART MURMUR, HX OF 05/06/2010  . Acute renal insufficiency 08/25/2010  . Viridans streptococci infection 08/25/2010  . Streptococcus infection 08/25/2010  . Tooth infection 08/25/2010  . Status post PICC central line placement 08/25/2010  . Arm pain, right 08/25/2010   Resolved Ambulatory Problems    Diagnosis Date Noted  . No Resolved Ambulatory Problems   No Additional Past Medical History   History  Substance Use Topics  . Smoking status: Never Smoker   . Smokeless tobacco: Never Used  . Alcohol Use: Not on file  - he is retired since 2012, worked for the [employer] company. 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 No pets. No travel.  family history is not on file.   Review of Systems See hpi      Objective:   Physical Exam  BP 122/71  Pulse 121  Temp 99 F (37.2 C) (Oral)  Wt 190 lb (86.183  kg)   General Appearance:    Alert, cooperative, no distress, appears stated age  Head:    Normocephalic, without obvious abnormality, atraumatic  Eyes:    PERRL, conjunctiva/corneas clear, EOM's intact, fundi    benign, both eyes       Ears:    Normal TM's and external ear canals, both ears  Nose:   Nares normal, septum midline, mucosa normal, no drainage   or sinus tenderness  Throat:   Lips, mucosa, and tongue normal; teeth and gums normal  Neck:   Supple, symmetrical, trachea midline, no adenopathy;           Back:     Symmetric, no curvature, ROM normal, no CVA tenderness  Lungs:     Clear to auscultation bilaterally, respirations unlabored     Heart:    Tachycardic, S1 and S2 normal, 3/6 SEM heard loudest at apex and radiating throughout precordium, no rub   or gallop  Abdomen:     Soft, non-tender, bowel sounds active all four quadrants,    no masses, no organomegaly        Extremities:   Extremities normal, atraumatic, no splinter hemorrhages, osler's or janeway lesions.  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 Ganji that I would recommend getting TEE to see if any valvular vegetations. Possibly also CT surgery consultation here at [facility] vs. [facility].   Will plan on admitting to initiate antibiotics:  Vancomycin per pharm protocol and ceftriaxone 2gm IV Q12hr  as empiric regimen.   Will change regimen once micro results from [facility] returns. Will need to call on August 3rd (#[phone] / specimen number [id number])  In today's clinic:  we will get cbc with diff, bmp, and repeat blood cultures  - place picc once we can document clearance of bacteremia  Back pain = may consider getting back MRI to rule out diskitis  depending on pathogen isolated on blood cultures from 7/31 from [facility].  Plan for hospital admission.  Greater than 45 minute spent coordinating care amongst other physicians and hospital admission  rtc in 2 wks  

## 2011-11-06 NOTE — CV Procedure (Signed)
Severe subaortic stenosis with SAM of the anterior mitral leaflet. Mild thickening of the MV with mild calcification on the anterior leaflet cannot exclude old healed vegetation.   Aortic valve right coronary cusp is calcified with severe restriction in motion. Probably mild aortic stenosis. Left and non coronary cusps open normally. Old healed vegetation on the non coronary cusp cannot be excluded.  Mild AI  No obvious vegetation noted on TEE.  Mild TR with mild pulmonary hypertension.   3 mg versed, 25 mcg fentanyl IV used for sedation.

## 2011-11-06 NOTE — Progress Notes (Signed)
Received patient from endo post TEE. Patient is alert and oriented, not in any distress, VSS. Will continue to monitor.

## 2011-11-06 NOTE — Progress Notes (Signed)
INFECTIOUS DISEASES CLINIC  RFV: gram positive cocci bacteremia, concerning for subacute endocarditis  Subjective:    Patient ID: [name], male    DOB: Oct 23, 1940, 71 y.o.   MRN: [id number]  HPI [name] is a 71 yo Male with hx of CAD s/p PCI and subvalvular aortic stenosis who has previous hx of streptococcal native valve endocarditis x 2 (S.salivarius in Jan 2012, and viridans group Streptococci in March 2012).  Presently, He reports having fevers, up to 101.50F, intermittently for the last 2 wks. He denies rigors, chills, or nightsweats, but has occ hotflash. When his symptoms first started it was also accompanied by low back pain. He saw his PCP who empirically treated him with cipro and hydrocodone ? Possibly UTI (unclear if UA/Ur Cx ever done). The patient stated that his lab work looked like he was dehydrated. During the 5 day course of antibiotics, he did not feel appreciably better. His back pain did remit, after having more regular bowel movements. Since he was having persistent fevers, he went to his PCP on 7/31 for re-evaluation. At this time blood work including blood cultures were taken, and he was given doxycycline (now on Day#2) for his fivers. The patient denies any recent travel, no tickbites. He was also seen by his cardiologist, Dr. Jacinto Halim, who performed a repeat TTE for concern of endocarditis. His TTE on 8/1 revealed EF 60-65%, mild AR, with sevre sub-aortic stenosis, mod AV leaflet thickening with mild calcifications. AVleaflet mobility mildly restricted. LVOT obstruction. Also noted mild to mod MR, mild MV leaflet thickening. Mod TR, trave Pulmonic regurgitation. No vegetations visualized on TTE. Concurrently his blood cultures from 7/31 grew out GPC on day #2.   [name] was referred back to ID clinic for evaluation of recurrent subacute NVE.  1st episode of endocarditis: had onset of fevers in November, started on moxifloxacin which temporarily improved his  fevers, but presented with syncope while driving. Strep salivarius with elevated PCN MIC. Started initially on vanco and gent -> but discharged on vanco x 4 wks.   2nd episode of endocarditis- had recurrent fever, work up revealed viridans strep but different PCN MIC, thus thought to be a 2nd pathogen. His work up showed possible dental abscess as source of infection, had root canal toward the end of 6 wks of ceftriaxone.  ROS: The patient denies palpations, orthopnea, DOE, dizziness, syncope, no weight loss. Has occasional chest pressure from GERD/relieved by belching. He subscribes fever, and fatigue. He has slight pain behind right knee which he thinks started after he was sleeping in a lounger when his wife was hospitalized. He states that his back pain is improved but not completely resolved. Other systems negative.  Current micro: [facility] blood cx on 7/31: GPCs thus far  Historic micro: 1/10 blood 2/2 streptococcus salivarius ( ctx 0.25, levo 1.0, PCN 0.75, vanco 1.0) 1/14 blood NGTD  3/23 blood 2/2 viridans group streptococcus - no further id ( PCN 0.094) 3/25 blood NGTD  Allergies  Allergen Reactions  . Penicillins   - rash, but denies hives or shortness of breath. - he has tolerated cephalexin and ceftriaxone without difficulty Current Outpatient Prescriptions on File Prior to Visit  Medication Sig Dispense Refill  . Ascorbic Acid (VITAMIN C) 500 MG tablet Take 500 mg by mouth 2 (two) times daily.        Marland Kitchen aspirin 325 MG tablet Take 325 mg by mouth daily.        . Bilberry 60 MG  CAPS Take 1 capsule by mouth at bedtime. 1000 mg daily      . cholecalciferol (VITAMIN D) 1000 UNITS tablet Take 1,000 Units by mouth daily. Vitamin D/ K2 2000 units daily      . Coenzyme Q10 (EQL COQ10) 300 MG CAPS Take 1 capsule by mouth daily.        . fexofenadine (ALLEGRA) 180 MG tablet Take 180 mg by mouth daily as needed.      . Ginkgo Biloba 60 MG CAPS Take 1 capsule by mouth daily.        Marland Kitchen  glucosamine-chondroitin 500-400 MG tablet Take 2 tablets by mouth 2 (two) times daily.      Marland Kitchen HAWTHORN BERRY PO        . Horse Chestnut 300 MG CAPS Take 1 capsule by mouth daily.        . metoprolol (LOPRESSOR) 50 MG tablet Take 75 mg by mouth 2 (two) times daily. (1.5 tablets)       . Multiple Vitamin (MULTIVITAMIN) tablet Take 1 tablet by mouth daily.        Marland Kitchen omeprazole (PRILOSEC) 10 MG capsule Take 10 mg by mouth daily.        . pravastatin (PRAVACHOL) 40 MG tablet Take 40 mg by mouth daily.      . saw palmetto 160 MG capsule Take 160 mg by mouth.      . vitamin E 400 UNIT capsule Take 400 Units by mouth 2 (two) times daily.       Active Ambulatory Problems    Diagnosis Date Noted  . ANEMIA, IRON DEFICIENCY 05/06/2010  . ENDOCARDITIS, BACTERIAL, SUBACUTE 05/07/2010  . 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 SYNCOPE 05/06/2010  . DYSPNEA ON EXERTION 05/06/2010  . ECHOCARDIOGRAM, ABNORMAL 05/06/2010  . HEART MURMUR, HX OF 05/06/2010  . Acute renal insufficiency 08/25/2010  . Viridans streptococci infection 08/25/2010  . Streptococcus infection 08/25/2010  . Tooth infection 08/25/2010  . Status post PICC central line placement 08/25/2010  . Arm pain, right 08/25/2010   Resolved Ambulatory Problems    Diagnosis Date Noted  . No Resolved Ambulatory Problems   No Additional Past Medical History   History  Substance Use Topics  . Smoking status: Never Smoker   . Smokeless tobacco: Never Used  . Alcohol Use: Not on file  - he is retired since 2012, worked for [employer]. 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 No pets. No travel.  family history is not on file.   Review of Systems See hpi      Objective:   Physical Exam  BP 122/71  Pulse 121  Temp 99 F (37.2 C) (Oral)  Wt 190 lb (86.183  kg)   General Appearance:    Alert, cooperative, no distress, appears stated age  Head:    Normocephalic, without obvious abnormality, atraumatic  Eyes:    PERRL, conjunctiva/corneas clear, EOM's intact, fundi    benign, both eyes       Ears:    Normal TM's and external ear canals, both ears  Nose:   Nares normal, septum midline, mucosa normal, no drainage   or sinus tenderness  Throat:   Lips, mucosa, and tongue normal; teeth and gums normal  Neck:   Supple, symmetrical, trachea midline, no adenopathy;  Back:     Symmetric, no curvature, ROM normal, no CVA tenderness  Lungs:     Clear to auscultation bilaterally, respirations unlabored     Heart:    Tachycardic, S1 and S2 normal, 3/6 SEM heard loudest at apex and radiating throughout precordium, no rub   or gallop  Abdomen:     Soft, non-tender, bowel sounds active all four quadrants,    no masses, no organomegaly        Extremities:   Extremities normal, atraumatic, no splinter hemorrhages, osler's or janeway lesions.  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 Jacinto Halim that I would recommend getting TEE to see if any valvular vegetations. Possibly also CT surgery consultation here at [facility] vs. [facility].   Will plan on admitting to initiate antibiotics:  Vancomycin per pharm protocol and ceftriaxone 2gm IV Q12hr  as empiric regimen.   Will change regimen once micro results from [facility] returns. Will need to call on August 3rd (#[phone] / specimen number [phone])  In today's clinic:  we will get cbc with diff, bmp, and repeat blood cultures  - place picc once we can document clearance of bacteremia  Back pain = may consider getting back MRI to rule out diskitis  depending on pathogen isolated on blood cultures from 7/31 from [facility].  Plan for hospital admission.  Greater than 45 minute spent coordinating care amongst other physicians and hospital admission  rtc in 2 wks

## 2011-11-07 LAB — COMPREHENSIVE METABOLIC PANEL
ALT: 11 U/L (ref 0–53)
AST: 20 U/L (ref 0–37)
Alkaline Phosphatase: 66 U/L (ref 39–117)
Calcium: 8.5 mg/dL (ref 8.4–10.5)
Glucose, Bld: 128 mg/dL — ABNORMAL HIGH (ref 70–99)
Potassium: 3.7 mEq/L (ref 3.5–5.1)
Sodium: 133 mEq/L — ABNORMAL LOW (ref 135–145)
Total Protein: 6.5 g/dL (ref 6.0–8.3)

## 2011-11-07 LAB — CBC
Hemoglobin: 10.5 g/dL — ABNORMAL LOW (ref 13.0–17.0)
MCH: 30.8 pg (ref 26.0–34.0)
MCHC: 34.3 g/dL (ref 30.0–36.0)
Platelets: 154 10*3/uL (ref 150–400)
RBC: 3.41 MIL/uL — ABNORMAL LOW (ref 4.22–5.81)

## 2011-11-07 MED ORDER — SODIUM CHLORIDE 0.9 % IJ SOLN
10.0000 mL | Freq: Two times a day (BID) | INTRAMUSCULAR | Status: DC
  Administered 2011-11-07 – 2011-11-11 (×9): 10 mL via INTRAVENOUS

## 2011-11-07 MED ORDER — SALINE SPRAY 0.65 % NA SOLN
1.0000 | Freq: Four times a day (QID) | NASAL | Status: DC
  Administered 2011-11-07 – 2011-11-11 (×5): 1 via NASAL
  Filled 2011-11-07: qty 44

## 2011-11-07 MED ORDER — HEPARIN SODIUM (PORCINE) 5000 UNIT/ML IJ SOLN: 5000.0000 [IU] | Freq: Three times a day (TID) | INTRAMUSCULAR | Status: DC

## 2011-11-07 NOTE — Evaluation (Signed)
Physical Therapy Evaluation Patient Details Name: [name] MRN: [id number] DOB: [birth year] Today's Date: 11/07/2011 Time: 9629-5284 PT Time Calculation (min): 28 min  PT Assessment / Plan / Recommendation Clinical Impression  pt adm with 2+ week h/o fatigue and not feeling well due to questionable recurrence of endocarditis.  TEE neg. for vegetations.  On eval, pt performed well with only minor disturbances that do not suggest much safety risk.  Pt is not far off his baseline function, but needs to continue with some self-driven activity.  No further PT needs.  D/C    PT Assessment  Patent does not need any further PT services    Follow Up Recommendations  No PT follow up    Barriers to Discharge        Equipment Recommendations  None recommended by PT    Recommendations for Other Services     Frequency      Precautions / Restrictions     Pertinent Vitals/Pain       Mobility  Bed Mobility Bed Mobility: Supine to Sit;Sitting - Scoot to Edge of Bed Supine to Sit: 7: Independent Sitting - Scoot to [place] of Bed: 7: Independent Transfers Transfers: Sit to Stand;Stand to Sit Sit to Stand: 7: Independent Stand to Sit: 7: Independent Ambulation/Gait Ambulation/Gait Assistance: 7: Independent Ambulation Distance (Feet): 280 Feet Assistive device: None Ambulation/Gait Assistance Details: safe and steady gait with mild disturbances during scanning his environment Gait Pattern: Within Functional Limits Stairs: Yes Stairs Assistance: 6: Modified independent (Device/Increase time) Stair Management Technique: One rail Right;Forwards;Backwards Number of Stairs: 2  Wheelchair Mobility Wheelchair Mobility: No    Exercises     PT Diagnosis:    PT Problem List:   PT Treatment Interventions:     PT Goals    Visit Information  Last PT Received On: 11/07/11 Assistance Needed: +1    Subjective Data  Subjective: It's been hard to make myself do things lately Patient  Stated Goal: Back to feeling well   Prior Functioning  Home Living Lives With: Spouse Type of Home: House Home Access: Stairs to enter [employer] of Steps: 5 Entrance Stairs-Rails: Left;Right Home Layout: One level;Able to live on main level with bedroom/bathroom Bathroom Shower/Tub: Tub/shower unit;Curtain Bathroom Toilet: Handicapped height Home Adaptive Equipment: Walker - rolling;Straight cane;Grab bars in shower;Other (comment) Prior Function Level of Independence: Independent Able to Take Stairs?: Yes Driving: Yes Communication Communication: No difficulties    Cognition  Overall Cognitive Status: Appears within functional limits for tasks assessed/performed Arousal/Alertness: Awake/alert Orientation Level: Oriented X4 / Intact Behavior During Session: [facility] for tasks performed    Extremity/Trunk Assessment Right Upper Extremity Assessment RUE ROM/Strength/Tone: Within functional levels Left Upper Extremity Assessment LUE ROM/Strength/Tone: Within functional levels Right Lower Extremity Assessment RLE ROM/Strength/Tone: Within functional levels Left Lower Extremity Assessment LLE ROM/Strength/Tone: Within functional levels Trunk Assessment Trunk Assessment: Normal   Balance Balance Balance Assessed: Yes Standardized Balance Assessment Standardized Balance Assessment: Dynamic Gait Index Dynamic Gait Index Level Surface: Normal Change in Gait Speed: Normal Gait with Horizontal Head Turns: Mild Impairment Gait with Vertical Head Turns: Mild Impairment Gait and Pivot Turn: Normal Step Over Obstacle: Normal Step Around Obstacles: Normal Steps: Mild Impairment Total Score: 21   End of Session PT - End of Session Activity Tolerance: Patient tolerated treatment well Patient left: in chair;with call bell/phone within reach Nurse Communication: Mobility status (need to S amb in  the halls)  GP     Murrel Freet, Eliseo Gum 11/07/2011, 3:42  PM  11/07/2011  Stillmore Bing, PT [phone] (pager)

## 2011-11-07 NOTE — Progress Notes (Signed)
PHARMACIST - PHYSICIAN ORDER COMMUNICATION  CONCERNING: P&T Medication Policy on Herbal Medications  DESCRIPTION:  This patient's order for:  Coenzyme, glucosamine  has been noted.  This product(s) is classified as an "herbal" or natural product. Due to a lack of definitive safety studies or FDA approval, nonstandard manufacturing practices, plus the potential risk of unknown drug-drug interactions while on inpatient medications, the Pharmacy and Therapeutics Committee does not permit the use of "herbal" or natural products of this type within [facility].   ACTION TAKEN: The pharmacy department is unable to verify this order at this time and your patient has been informed of this safety policy. Please reevaluate patient's clinical condition at discharge and address if the herbal or natural product(s) should be resumed at that time.  Christoper Fabian, PharmD, BCPS Clinical pharmacist, pager [phone] 11/07/2011  1:26 PM

## 2011-11-07 NOTE — Progress Notes (Addendum)
[facility] PROGRESS NOTE  [name] ZOX:[id number] DOB: 1940-04-24 DOA: 11/06/2011 PCP: Juline Patch, MD  Assessment/Plan: Active Problems:  Acute bacterial endocarditis  HTN (hypertension)  BPH (benign prostatic hyperplasia)  Subaortic stenosis  Back pain  Hyperlipidemia   Active Problems:  1. Acute bacterial endocarditis: Patient presents with 2 weeks of feeling non-specifically unwell, with recurrent pyrexia, fatigue and diminished effort tolerance, despite empiric antibiotic treatment. Blood cultures have grown GPC in pairs and chains, in 2:2 bottles, raising the suspicion of recurrent endocarditis, against a background of 2 previous episodes. He appears hemodynamically stable at this time, and is afebrile today. TEE was done on 11/06/11, by Dr Yates Decamp, cardiologist, and showed no vegetations. The right ventricular systolic pressure was increased consistent with mild pulmonary Hypertension. Currently on iv Vancomycin/Rocephin, per ID recommendations. Once culprit organisms are identified and sensitivities obtained, we shall defer to Dr Judyann Munson, re type and duration of definitve antibiotic therapy.  2. Back pain: Clinically, Patient does not appear to be in acute distress from this. 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 Patient has been reassured accordingly.  3. HTN (hypertension)BP appears controlled at this time on preadmission antihypertensives, which we have continued.  4. BPH (benign prostatic hyperplasia): No symptoms of prostatism at this time.  5. Subaortic stenosis: No clinical evidence of CHF or decompensation at this time.  6. Dyslipidemia. On Statin.  7. Epistaxis: This was brief, this AM. Will observe. Patient's platelet count is normal and he is not on anticoagulants. Saline nasal spray.    Comment: Will discontinue iv fluids.   Code Status:  Full Code. Family Communication:  Disposition  Plan: Per ID.   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 Apparently, over the last 2 weeks, he has had fevers, up to 101.42F, intermittently, as well as chills, and as a matter of fact, the last episode was today. He has also had occasional low back pain. His PMD empirically treated him with Ciprofloxacin for a presumed UTI for 5 days, without appreciable improvement. He continues to feel fatigued, gets SOB on ambulating about a hundred feet on the flat On 7/31/123, he saw Dr Ricki Miller, his PMD for re-evaluation, blood cultures were taken, and he was given Doxycycline (now on Day#2). The patient denies any recent travel, no tickbites. Given his known history of previous endocarditis, Dr. Jacinto Halim, cardiologist performed who performed a TTE on 11/05/11, which showed EF 60-65%, mild AR, severe sub-aortic stenosis, moderate AV leaflet thickening with mild calcifications. AV leaflet mobility mildly restricted. LVOT obstruction. Also noted mild to moderate MR, mild MV leaflet thickening. Moderate TR, trave Pulmonic regurgitation. No vegetations visualized. His blood cultures from 10/07/11 grew out GPC on day #2. He is scheduled for TEE on 11/06/11.    Consultants:  Dr Sherilyn Banker, for TEE.  Dr Judyann Munson, ID.  Procedures:  TEE on 11/06/11.   MRI Thoraco-lumbar spine.   CXR.  Antibiotics:  Vancomycin/Rocephin started 11/06/11.  HPI/Subjective: Feels better. Has had occasional sweats. Had a transient nose bleed this AM.  Objective: Vital signs in last 24 hours: Temp:  [97.3 F (36.3 C)-100.4 F (38 C)] 97.3 F (36.3 C) (08/03 0419) Pulse Rate:  [  94-114] 94  (08/03 1001) Resp:   [10-24] 19  (08/03 0419) BP: (90-126)/(55-79) 100/58 mmHg (08/03 1001) SpO2:  [93 %-99 %] 95 % (08/03 0419) Weight:  [84.732 kg (186 lb 12.8 oz)-86.183 kg (190 lb)] 84.732 kg (186 lb 12.8 oz) (08/02 1713) Weight change:  Last BM Date: 11/07/11  Intake/Output from previous day: 08/02 0701 - 08/03 0700 In: 240 [P.O.:240] Out: 925 [Urine:925] Total I/O In: -  Out: 450 [Urine:450]   Physical Exam: General: Patient does not appear to be in obvious acute distress. Alert, communicative, fully oriented, talking in complete sentences, not short of breath at rest.  HEENT: Mild clinical pallor, no jaundice, no conjunctival injection or discharge. Hydration status appears fair.  NECK: Supple, JVP not seen, no carotid bruits, no palpable lymphadenopathy, no palpable goiter.  CHEST: Clinically clear to auscultation, no wheezes, no crackles.  HEART: Sounds 1 and 2 heard, normal, regular, systolic murmur.  ABDOMEN: Flat, soft, non-tender, no palpable organomegaly, no palpable masses, normal bowel sounds.  GENITALIA: Not examined.  LOWER EXTREMITIES: No pitting edema, palpable peripheral pulses.  MUSCULOSKELETAL SYSTEM: Generalized osteoarthritic changes, otherwise, normal.  CENTRAL NERVOUS SYSTEM: No focal neurologic deficit on gross examination.  Lab Results:  [facility] 11/07/11 0650 11/06/11 1712  WBC 9.0 9.6  HGB 10.5* 10.4*  HCT 30.6* 30.5*  PLT 154 138*    Basename 11/07/11 0650 11/06/11 1712  NA 133* 134*  K 3.7 4.4  CL 97 99  CO2 24 28  GLUCOSE 128* 116*  BUN 16 18  CREATININE 1.06 1.07  CALCIUM 8.5 8.7   Recent Results (from the past 240 hour(s))  CULTURE, BLOOD (SINGLE)     Status: Normal (Preliminary result)   Collection Time   11/06/11 12:46 PM      Component Value Range Status Comment   Preliminary Report GRAM POSITIVE COCCI IN PAIRS AND CHAINS   Preliminary   CULTURE, BLOOD (SINGLE)     Status: Normal (Preliminary result)   Collection Time   11/06/11 12:51 PM       Component Value Range Status Comment   Preliminary Report GRAM POSITIVE COCCI IN PAIRS AND CHAINS   Preliminary      Studies/Results: X-ray Chest Pa And Lateral   11/06/2011  *RADIOLOGY REPORT*  Clinical Data: Endocarditis  CHEST - 2 VIEW  Comparison: 07/01/2010  Findings: Mild enlargement of the cardiac silhouette.  No pleural effusion or edema noted.  No airspace consolidation identified.  Review of the visualized osseous structures is unremarkable.  IMPRESSION:  1.  No acute findings.  Original Report Authenticated By: Rosealee Albee, M.D.   Mr Thoracic Spine W Wo Contrast  11/07/2011  *RADIOLOGY REPORT*  Clinical Data: Infectious endocarditis.  Febrile illness.  Thoracic and lumbar diskitis.  MRI THORACIC SPINE WITHOUT AND WITH CONTRAST  Technique:  Multiplanar and multiecho pulse sequences of the thoracic spine were obtained without and with intravenous contrast.  Contrast:  18 ml Multihance.  Comparison: None.  Findings: The alignment shows a mild dextroconvex curvature with the apex at T8.  No spondylolisthesis.  Numbering was performed from the craniocervical junction.  Spinal segmentation is anatomic. Lower cervical spondylosis is present with C6-C7 degenerative disc disease and 3 mm retrolisthesis.  There is also flattening of the C6 vertebra, partially visualized probably due to chronic degenerative disease.  Mild central stenosis at C6-C7 is partially visualized.  Bone marrow signal is within normal limits.  There is no marrow edema or effacement of normal fatty marrow to suggest  diskitis or osteomyelitis.  The thoracic cord shows normal signal. The paraspinal soft tissues are within normal limits.  C6-C7 shows mild to moderate central stenosis, seen on axial and sagittal imaging with AP diameter of the thecal sac about 8 mm. Posterior ligamentum flavum redundancy contributes to the stenosis. C6-C7 right greater than left foraminal stenosis also appears to be present.  There is no thoracic  central or foraminal stenosis.  Axial images are mildly technically degraded by motion artifact.  No abnormal enhancement after Gadolinium administration.  IMPRESSION: Negative thoracic spine MR for diskitis/osteomyelitis.  C6-C7 cervical spondylosis.  Original Report Authenticated By: Andreas Newport, M.D.   Mr Lumbar Spine W Wo Contrast  11/07/2011  *RADIOLOGY REPORT*  Clinical Data: Lumbar spondylodiskitis.  Endocarditis.  Bacteremia.  MRI LUMBAR SPINE WITHOUT AND WITH CONTRAST  Technique:  Multiplanar and multiecho pulse sequences of the lumbar spine were obtained without and with intravenous contrast.  Contrast:  18 ml Multihance.  Comparison: Thoracic spine MR same day.  Findings: Five lumbar type vertebral bodies.  Grade 1 anterolisthesis of L4 on L5 appears degenerative.  The paraspinal soft tissues are within normal limits.  There is a levoconvex lumbar scoliosis with the apex at L4.  Spinal cord terminates posterior to the L1 vertebra. Bone marrow signal shows suppression of fatty marrow, which is a nonspecific finding, commonly associated with anemia, chronic disease, cigarette smoking, and obesity.  No destructive osseous lesions.  There is no liquefaction of the disc spaces or abnormal enhancement to suggest diskitis/osteomyelitis.  No epidural abscess.  T12-L1:  Negative.  L1-L2:  Negative.  L2-L3:  Disc desiccation and shallow bulging without stenosis.  L3-L4:  Mild disc desiccation.  Central canal patent.  Mild left foraminal stenosis associated with bulging disc and short pedicles.  L4-L5:  Moderate central stenosis is present with right greater than left lateral recess stenosis.  This is multifactorial, associated with broad-based disc bulge, uncoverage of the disc from anterolisthesis and severe facet arthrosis.  Small facet effusions are present.  On post-gadolinium imaging, there is enhancement around the facet joints compatible with active arthritis.  The periarticular inflammation is minimal,  and this most likely represents ordinary degenerative change rather than septic facet arthritis.  Moderate right and mild left foraminal stenosis potentially affects the exiting L4 nerves.  Anterolisthesis measures 3 mm.  L5-S1:  Negative.  IMPRESSION: 1.  L4-L5 predominant lumbar spondylosis.  Bilateral L4-L5 severe facet arthritis with small effusions. 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  Septic arthritis is considered unlikely based on the appearance.  Multifactorial spinal, lateral recess and right greater than left foraminal stenosis at L4-L5.  2.  More mild degenerative disc disease at other levels. 3.  Levoconvex lumbar scoliosis. 4.  Negative for diskitis/osteomyelitis.  Original Report Authenticated By: Andreas Newport, M.D.    Medications: Scheduled Meds:   . cefTRIAXone (ROCEPHIN)  IV  2 g Intravenous Q12H  . cholecalciferol  2,000 Units Oral Daily  . docusate sodium  100 mg Oral BID  . metoprolol succinate  25 mg Oral Daily  . multivitamin with minerals  1 tablet Oral Daily  . pantoprazole  40 mg Oral Q0600  . simvastatin  20 mg Oral q1800  . vancomycin  1,000 mg Intravenous Once  . vancomycin  1,000 mg Intravenous Q12H  . vitamin C  500 mg Oral BID  . vitamin E  400 Units Oral BID  . DISCONTD: cefTRIAXone (ROCEPHIN)  IV  2 g Intravenous Q24H  . DISCONTD:  Coenzyme Q10  1 capsule Oral Daily  . DISCONTD: glucosamine-chondroitin  2 tablet Oral BID  . DISCONTD: sodium chloride  3 mL Intravenous Q12H   Continuous Infusions:   . sodium chloride 50 mL/hr at 11/06/11 1752  . DISCONTD: sodium chloride 500 mL (11/06/11 1538)   PRN Meds:.acetaminophen, acetaminophen, gadobenate dimeglumine, morphine injection, ondansetron (ZOFRAN) IV, ondansetron, oxyCODONE, DISCONTD: sodium chloride, DISCONTD: butamben-tetracaine-benzocaine, DISCONTD: fentaNYL, DISCONTD: midazolam, DISCONTD: sodium chloride    LOS: 1 day   [name],CHRISTOPHER  [facility] Pager [phone].  If 8PM-8AM, please contact night-coverage at [email or web address], password [facility] 11/07/2011, 2:17 PM  LOS: 1 day

## 2011-11-08 LAB — BASIC METABOLIC PANEL
BUN: 16 mg/dL (ref 6–23)
Calcium: 8.6 mg/dL (ref 8.4–10.5)
Creatinine, Ser: 1.13 mg/dL (ref 0.50–1.35)
GFR calc Af Amer: 75 mL/min — ABNORMAL LOW (ref >90)
GFR calc non Af Amer: 64 mL/min — ABNORMAL LOW (ref >90)
Glucose, Bld: 106 mg/dL — ABNORMAL HIGH (ref 70–99)

## 2011-11-08 LAB — CBC
HCT: 29.5 % — ABNORMAL LOW (ref 39.0–52.0)
Hemoglobin: 9.9 g/dL — ABNORMAL LOW (ref 13.0–17.0)
MCH: 29.9 pg (ref 26.0–34.0)
MCHC: 33.6 g/dL (ref 30.0–36.0)
MCV: 89.1 fL (ref 78.0–100.0)
RDW: 12.9 % (ref 11.5–15.5)

## 2011-11-08 NOTE — Progress Notes (Signed)
[facility] PROGRESS NOTE  EDUARDO HONOR YQM:[id number] DOB: 18-Jul-1940 DOA: 11/06/2011 PCP: Juline Patch, MD  Assessment/Plan: Active Problems:  Acute bacterial endocarditis  HTN (hypertension)  BPH (benign prostatic hyperplasia)  Subaortic stenosis  Back pain  Hyperlipidemia   Active Problems:  1. Acute bacterial endocarditis: Patient presents with 2 weeks of feeling non-specifically unwell, with recurrent pyrexia, fatigue and diminished effort tolerance, despite empiric antibiotic treatment. Blood cultures have grown GPC in pairs and chains, in 2:2 bottles, raising the suspicion of recurrent endocarditis, against a background of 2 previous episodes. He appears hemodynamically stable at this time, and is afebrile today. TEE was done on 11/06/11, by Dr Yates Decamp, cardiologist, and showed no vegetations. The right ventricular systolic pressure was increased consistent with mild pulmonary Hypertension. Currently on iv Vancomycin/Rocephin, per ID recommendations. Once culprit organisms are identified and sensitivities obtained, we shall defer to Dr Judyann Munson, re type and duration of definitve antibiotic therapy. I suspect a few weeks of antibiotics will be required. Will likely arrange PICC on 11/09/11. 2. Back pain: Clinically, Patient does not appear to be in acute distress from this. 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 Patient has been reassured accordingly. Not problematic at this time. 3. HTN (hypertension)BP appears controlled at this time on preadmission antihypertensives, which we have continued.  4. BPH (benign prostatic hyperplasia): No symptoms of prostatism at this time.  5. Subaortic stenosis: No clinical evidence of CHF or decompensation at this time.  6. Dyslipidemia. On Statin.  7. Epistaxis: This was brief, on 11/07/11. Patient's platelet count is normal and he is not on anticoagulants. Saline nasal spray.  No recurrence.    Code Status:  Full Code. Family Communication:  Disposition Plan: Per ID.   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 Apparently, over the last 2 weeks, he has had fevers, up to 101.40F, intermittently, as well as chills, and as a matter of fact, the last episode was today. He has also had occasional low back pain. His PMD empirically treated him with Ciprofloxacin for a presumed UTI for 5 days, without appreciable improvement. He continues to feel fatigued, gets SOB on ambulating about a hundred feet on the flat On 7/31/123, he saw Dr Ricki Miller, his PMD for re-evaluation, blood cultures were taken, and he was given Doxycycline (now on Day#2). The patient denies any recent travel, no tickbites. Given his known history of previous endocarditis, Dr. Jacinto Halim, cardiologist performed who performed a TTE on 11/05/11, which showed EF 60-65%, mild AR, severe sub-aortic stenosis, moderate AV leaflet thickening with mild calcifications. AV leaflet mobility mildly restricted. LVOT obstruction. Also noted mild to moderate MR, mild MV leaflet thickening. Moderate TR, trave Pulmonic regurgitation. No vegetations visualized. His blood cultures from 10/07/11 grew out GPC on day #2. He is scheduled for TEE on 11/06/11.    Consultants:  Dr Sherilyn Banker, for TEE.  Dr Judyann Munson, ID.  Procedures:  TEE on 11/06/11.   MRI Thoraco-lumbar spine.   CXR.  Antibiotics:  Vancomycin/Rocephin started 11/06/11.  HPI/Subjective: No new issues.  Objective: Vital signs in last 24 hours: Temp:  [98.4 F (36.9 C)-99.3 F (37.4 C)] 99.3 F (37.4 C) (08/04 0423)  Pulse Rate:  [90-96] 95   (08/04 0423) Resp:  [16-18] 18  (08/04 0423) BP: (99-113)/(55-68) 113/55 mmHg (08/04 0423) SpO2:  [95 %-98 %] 95 % (08/04 0423) Weight change:  Last BM Date: 11/07/11  Intake/Output from previous day: 08/03 0701 - 08/04 0700 In: 240 [P.O.:240] Out: 1450 [Urine:1450]     Physical Exam: General: Patient does not appear to be in obvious acute distress. Alert, communicative, fully oriented, talking in complete sentences, not short of breath at rest.  HEENT: Mild clinical pallor, no jaundice, no conjunctival injection or discharge. Hydration status appears fair.  NECK: Supple, JVP not seen, no carotid bruits, no palpable lymphadenopathy, no palpable goiter.  CHEST: Clinically clear to auscultation, no wheezes, no crackles.  HEART: Sounds 1 and 2 heard, normal, regular, systolic murmur.  ABDOMEN: Flat, soft, non-tender, no palpable organomegaly, no palpable masses, normal bowel sounds.  GENITALIA: Not examined.  LOWER EXTREMITIES: No pitting edema, palpable peripheral pulses.  MUSCULOSKELETAL SYSTEM: Generalized osteoarthritic changes, otherwise, normal.  CENTRAL NERVOUS SYSTEM: No focal neurologic deficit on gross examination.  Lab Results:  Basename 11/08/11 0500 11/07/11 0650  WBC 6.6 9.0  HGB 9.9* 10.5*  HCT 29.5* 30.6*  PLT 160 154    Basename 11/08/11 0500 11/07/11 0650  NA 136 133*  K 4.1 3.7  CL 100 97  CO2 26 24  GLUCOSE 106* 128*  BUN 16 16  CREATININE 1.13 1.06  CALCIUM 8.6 8.5   Recent Results (from the past 240 hour(s))  CULTURE, BLOOD (SINGLE)     Status: Normal (Preliminary result)   Collection Time   11/06/11 12:46 PM      Component Value Range Status Comment   Preliminary Report GRAM POSITIVE COCCI IN PAIRS AND CHAINS   Preliminary   CULTURE, BLOOD (SINGLE)     Status: Normal (Preliminary result)   Collection Time   11/06/11 12:51 PM      Component Value Range Status Comment   Preliminary Report GRAM POSITIVE COCCI IN PAIRS AND CHAINS   Preliminary       Studies/Results: X-ray Chest Pa And Lateral   11/06/2011  *RADIOLOGY REPORT*  Clinical Data: Endocarditis  CHEST - 2 VIEW  Comparison: 07/01/2010  Findings: Mild enlargement of the cardiac silhouette.  No pleural effusion or edema noted.  No airspace consolidation identified.  Review of the visualized osseous structures is unremarkable.  IMPRESSION:  1.  No acute findings.  Original Report Authenticated By: Rosealee Albee, M.D.   Mr Thoracic Spine W Wo Contrast  11/07/2011  *RADIOLOGY REPORT*  Clinical Data: Infectious endocarditis.  Febrile illness.  Thoracic and lumbar diskitis.  MRI THORACIC SPINE WITHOUT AND WITH CONTRAST  Technique:  Multiplanar and multiecho pulse sequences of the thoracic spine were obtained without and with intravenous contrast.  Contrast:  18 ml Multihance.  Comparison: None.  Findings: The alignment shows a mild dextroconvex curvature with the apex at T8.  No spondylolisthesis.  Numbering was performed from the craniocervical junction.  Spinal segmentation is anatomic. Lower cervical spondylosis is present with C6-C7 degenerative disc disease and 3 mm retrolisthesis.  There is also flattening of the C6 vertebra, partially visualized probably due to chronic degenerative disease.  Mild central stenosis at C6-C7 is partially visualized.  Bone marrow signal is within normal limits.  There is no marrow edema or effacement of normal fatty marrow to suggest diskitis or osteomyelitis.  The thoracic cord shows normal signal. The paraspinal soft tissues are within normal limits.  C6-C7 shows mild  to moderate central stenosis, seen on axial and sagittal imaging with AP diameter of the thecal sac about 8 mm. Posterior ligamentum flavum redundancy contributes to the stenosis. C6-C7 right greater than left foraminal stenosis also appears to be present.  There is no thoracic central or foraminal stenosis.  Axial images are mildly technically degraded by motion artifact.  No abnormal enhancement  after Gadolinium administration.  IMPRESSION: Negative thoracic spine MR for diskitis/osteomyelitis.  C6-C7 cervical spondylosis.  Original Report Authenticated By: Andreas Newport, M.D.   Mr Lumbar Spine W Wo Contrast  11/07/2011  *RADIOLOGY REPORT*  Clinical Data: Lumbar spondylodiskitis.  Endocarditis.  Bacteremia.  MRI LUMBAR SPINE WITHOUT AND WITH CONTRAST  Technique:  Multiplanar and multiecho pulse sequences of the lumbar spine were obtained without and with intravenous contrast.  Contrast:  18 ml Multihance.  Comparison: Thoracic spine MR same day.  Findings: Five lumbar type vertebral bodies.  Grade 1 anterolisthesis of L4 on L5 appears degenerative.  The paraspinal soft tissues are within normal limits.  There is a levoconvex lumbar scoliosis with the apex at L4.  Spinal cord terminates posterior to the L1 vertebra. Bone marrow signal shows suppression of fatty marrow, which is a nonspecific finding, commonly associated with anemia, chronic disease, cigarette smoking, and obesity.  No destructive osseous lesions.  There is no liquefaction of the disc spaces or abnormal enhancement to suggest diskitis/osteomyelitis.  No epidural abscess.  T12-L1:  Negative.  L1-L2:  Negative.  L2-L3:  Disc desiccation and shallow bulging without stenosis.  L3-L4:  Mild disc desiccation.  Central canal patent.  Mild left foraminal stenosis associated with bulging disc and short pedicles.  L4-L5:  Moderate central stenosis is present with right greater than left lateral recess stenosis.  This is multifactorial, associated with broad-based disc bulge, uncoverage of the disc from anterolisthesis and severe facet arthrosis.  Small facet effusions are present.  On post-gadolinium imaging, there is enhancement around the facet joints compatible with active arthritis.  The periarticular inflammation is minimal, and this most likely represents ordinary degenerative change rather than septic facet arthritis.  Moderate right and mild  left foraminal stenosis potentially affects the exiting L4 nerves.  Anterolisthesis measures 3 mm.  L5-S1:  Negative.  IMPRESSION: 1.  L4-L5 predominant lumbar spondylosis.  Bilateral L4-L5 severe facet arthritis with small effusions. 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  Septic arthritis is considered unlikely based on the appearance.  Multifactorial spinal, lateral recess and right greater than left foraminal stenosis at L4-L5.  2.  More mild degenerative disc disease at other levels. 3.  Levoconvex lumbar scoliosis. 4.  Negative for diskitis/osteomyelitis.  Original Report Authenticated By: Andreas Newport, M.D.    Medications: Scheduled Meds:    . cefTRIAXone (ROCEPHIN)  IV  2 g Intravenous Q12H  . cholecalciferol  2,000 Units Oral Daily  . docusate sodium  100 mg Oral BID  . metoprolol succinate  25 mg Oral Daily  . multivitamin with minerals  1 tablet Oral Daily  . pantoprazole  40 mg Oral Q0600  . simvastatin  20 mg Oral q1800  . sodium chloride  1 spray Each Nare QID  . sodium chloride  10 mL Intravenous Q12H  . vancomycin  1,000 mg Intravenous Q12H  . vitamin C  500 mg Oral BID  . vitamin E  400 Units Oral BID  . DISCONTD: heparin subcutaneous  5,000 Units Subcutaneous Q8H   Continuous Infusions:    . DISCONTD: sodium chloride 50 mL/hr at 11/06/11  1752   PRN Meds:.acetaminophen, acetaminophen, morphine injection, ondansetron (ZOFRAN) IV, ondansetron, oxyCODONE    LOS: 2 days   Johnni Wunschel,CHRISTOPHER  [facility] Pager [phone]. If 8PM-8AM, please contact night-coverage at [email or web address], password [facility] 11/08/2011, 9:15 AM  LOS: 2 days

## 2011-11-09 ENCOUNTER — Telehealth: Payer: Self-pay | Admitting: *Deleted

## 2011-11-09 ENCOUNTER — Inpatient Hospital Stay (HOSPITAL_COMMUNITY): Payer: Medicare Other

## 2011-11-09 ENCOUNTER — Encounter (HOSPITAL_COMMUNITY): Payer: Self-pay | Admitting: Radiology

## 2011-11-09 DIAGNOSIS — I330 Acute and subacute infective endocarditis: Secondary | ICD-10-CM

## 2011-11-09 DIAGNOSIS — I421 Obstructive hypertrophic cardiomyopathy: Secondary | ICD-10-CM

## 2011-11-09 LAB — CBC
Hemoglobin: 9.9 g/dL — ABNORMAL LOW (ref 13.0–17.0)
MCH: 29.9 pg (ref 26.0–34.0)
MCHC: 33.9 g/dL (ref 30.0–36.0)
RDW: 12.8 % (ref 11.5–15.5)

## 2011-11-09 LAB — BASIC METABOLIC PANEL
Calcium: 8.6 mg/dL (ref 8.4–10.5)
GFR calc Af Amer: >90 mL/min (ref >90)
GFR calc non Af Amer: 82 mL/min — ABNORMAL LOW (ref >90)
Glucose, Bld: 106 mg/dL — ABNORMAL HIGH (ref 70–99)
Sodium: 136 mEq/L (ref 135–145)

## 2011-11-09 MED ORDER — IOHEXOL 300 MG/ML  SOLN
80.0000 mL | Freq: Once | INTRAMUSCULAR | Status: AC | PRN
  Administered 2011-11-09: 80 mL via INTRAVENOUS

## 2011-11-09 NOTE — Progress Notes (Addendum)
[facility] PROGRESS NOTE  [name] ZOX:[id number] DOB: 28-Aug-1940 DOA: 11/06/2011 PCP: Juline Patch, MD  Assessment/Plan: Active Problems:  Acute bacterial endocarditis  HTN (hypertension)  BPH (benign prostatic hyperplasia)  Subaortic stenosis  Back pain  Hyperlipidemia   Active Problems:  1. Acute bacterial endocarditis: Patient presents with 2 weeks of feeling non-specifically unwell, with recurrent pyrexia, fatigue and diminished effort tolerance, despite empiric antibiotic treatment. Blood cultures have grown GPC in pairs and chains, in 2:2 bottles, raising the suspicion of recurrent endocarditis, against a background of 2 previous episodes. TEE was done on 11/06/11, by Dr Yates Decamp, cardiologist, and showed no vegetations. The right ventricular systolic pressure was increased consistent with mild pulmonary hypertension. He has remained hemodynamically stable and afebrile. The culprit organism has been identified as Streptococcus Grp D/High probability for Strept Bovis, and sensitivities are still pending today. Have discussed with Dr Judyann Munson today, and she has given the go-ahead for Ceftriaxone monotherapy now day# 4, and it appears that about 6 weeks of antibiotic therapy would be adviceable. PICC line has been arranged for 11/09/11. Have requested GI consultation ([facility]), for endoscopic evaluation. Awaiting input from Dr Daiva Eves.  2. Back pain: Clinically, Patient does not appear to be in acute distress from this. 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 Patient has been reassured accordingly. Not problematic at this time. 3. HTN (hypertension)BP appears controlled at this time on preadmission antihypertensives, which we have continued.  4. BPH (benign prostatic hyperplasia): No symptoms of prostatism at this time.  5. Subaortic stenosis: No clinical evidence of CHF or decompensation at this time.  6.  Dyslipidemia. On Statin.  7. Epistaxis: This was brief, on 11/07/11. Patient's platelet count is normal and he is not on anticoagulants. Saline nasal spray. No recurrence.    Comment: In view of recurrent episodes of endocarditis, Dr Delrae Rend has arranged a consultation with Dr Donalee Citrin, at [facility], following discharge.   Code Status:  Full Code. Family Communication:  Disposition Plan: Per ID.   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 Apparently, over the last 2 weeks, he has had fevers, up to 101.32F, intermittently, as well as chills, and as a matter of fact, the last episode was today. He has also had occasional low back pain. His PMD empirically treated him with Ciprofloxacin for a presumed UTI for 5 days, without appreciable improvement. He continues to feel fatigued, gets SOB on ambulating about a hundred feet on the flat On 7/31/123, he saw Dr Ricki Miller, his PMD for re-evaluation, blood cultures were taken, and he was given Doxycycline (now on Day#2). The patient denies any recent travel, no tickbites. Given his known history of previous endocarditis, Dr. Jacinto Halim, cardiologist performed who performed a TTE on 11/05/11, which showed EF 60-65%, mild AR, severe sub-aortic stenosis, moderate AV leaflet thickening with mild calcifications. AV leaflet mobility mildly restricted. LVOT obstruction. Also noted mild to moderate MR, mild MV leaflet thickening. Moderate TR, trave Pulmonic regurgitation. No vegetations visualized. His blood cultures from 10/07/11 grew out GPC on day #2. He was scheduled for TEE on 11/06/11.    Consultants:  Dr Sherilyn Banker, for  TEE.  Dr Judyann Munson, ID.  Procedures:  TEE on 11/06/11.   MRI Thoraco-lumbar spine.   CXR.  Antibiotics:  Vancomycin/Rocephin started 11/06/11.  HPI/Subjective: No new issues.  Objective: Vital signs in last 24 hours: Temp:  [97.5 F (36.4 C)-98.9 F (37.2 C)] 98.6 F (37 C) (08/05 1318) Pulse Rate:  [67-100] 100  (08/05 1318) Resp:  [16-18] 18  (08/05 1318) BP: (91-117)/(52-70) 103/64 mmHg (08/05 1318) SpO2:  [95 %-100 %] 100 % (08/05 1318) Weight change:  Last BM Date: 11/08/11  Intake/Output from previous day:   Total I/O In: 480 [P.O.:480] Out: 1500 [Urine:1500]   Physical Exam: General: Patient does not appear to be in obvious acute distress. Alert, communicative, fully oriented, talking in complete sentences, not short of breath at rest.  HEENT: Mild clinical pallor, no jaundice, no conjunctival injection or discharge. Hydration status appears fair.  NECK: Supple, JVP not seen, no carotid bruits, no palpable lymphadenopathy, no palpable goiter.  CHEST: Clinically clear to auscultation, no wheezes, no crackles.  HEART: Sounds 1 and 2 heard, normal, regular, systolic murmur.  ABDOMEN: Flat, soft, non-tender, no palpable organomegaly, no palpable masses, normal bowel sounds.  GENITALIA: Not examined.  LOWER EXTREMITIES: No pitting edema, palpable peripheral pulses.  MUSCULOSKELETAL SYSTEM: Generalized osteoarthritic changes, otherwise, normal.  CENTRAL NERVOUS SYSTEM: No focal neurologic deficit on gross examination.  Lab Results:  Basename 11/09/11 0620 11/08/11 0500  WBC 5.9 6.6  HGB 9.9* 9.9*  HCT 29.2* 29.5*  PLT 187 160    Basename 11/09/11 0620 11/08/11 0500  NA 136 136  K 3.9 4.1  CL 100 100  CO2 24 26  GLUCOSE 106* 106*  BUN 14 16  CREATININE 0.97 1.13  CALCIUM 8.6 8.6   Recent Results (from the past 240 hour(s))  CULTURE, BLOOD (SINGLE)     Status: Normal   Collection Time   11/06/11 12:46 PM      Component Value Range Status Comment    Culture STREPTOCOCCUS GROUP D;high probability for S.bovis   Final    Organism ID, Bacteria STREPTOCOCCUS GROUP D;high probability for S.bovis   Final   CULTURE, BLOOD (SINGLE)     Status: Normal   Collection Time   11/06/11 12:51 PM      Component Value Range Status Comment   Organism ID, Bacteria STREPTOCOCCUS GROUP D;high probability for S.bovis   Final   CULTURE, BLOOD (ROUTINE X 2)     Status: Normal (Preliminary result)   Collection Time   11/07/11 12:20 AM      Component Value Range Status Comment   Specimen Description BLOOD LEFT ARM   Final    Special Requests BOTTLES DRAWN AEROBIC AND ANAEROBIC 10CC   Final    Culture  Setup Time 11/07/2011 09:06   Final    Culture     Final    Value:        BLOOD CULTURE RECEIVED NO GROWTH TO DATE CULTURE WILL BE HELD FOR 5 DAYS BEFORE ISSUING A FINAL NEGATIVE REPORT   Report Status PENDING   Incomplete   CULTURE, BLOOD (ROUTINE X 2)     Status: Normal (Preliminary result)   Collection Time   11/07/11 12:24 AM      Component Value Range Status Comment   Specimen Description BLOOD LEFT WRIST   Final    Special Requests BOTTLES DRAWN AEROBIC ONLY 10CC   Final    Culture  Setup Time 11/07/2011 09:05   Final    Culture  Final    Value:        BLOOD CULTURE RECEIVED NO GROWTH TO DATE CULTURE WILL BE HELD FOR 5 DAYS BEFORE ISSUING A FINAL NEGATIVE REPORT   Report Status PENDING   Incomplete      Studies/Results: No results found.  Medications: Scheduled Meds:    . cefTRIAXone (ROCEPHIN)  IV  2 g Intravenous Q12H  . cholecalciferol  2,000 Units Oral Daily  . docusate sodium  100 mg Oral BID  . metoprolol succinate  25 mg Oral Daily  . multivitamin with minerals  1 tablet Oral Daily  . pantoprazole  40 mg Oral Q0600  . simvastatin  20 mg Oral q1800  . sodium chloride  1 spray Each Nare QID  . sodium chloride  10 mL Intravenous Q12H  . vitamin C  500 mg Oral BID  . vitamin E  400 Units Oral BID  . DISCONTD: vancomycin  1,000 mg  Intravenous Q12H   Continuous Infusions:   PRN Meds:.acetaminophen, acetaminophen, morphine injection, ondansetron (ZOFRAN) IV, ondansetron, oxyCODONE    LOS: 3 days   Levetta Bognar,CHRISTOPHER  [facility] Pager [phone]. If 8PM-8AM, please contact night-coverage at [email or web address], password [facility] 11/09/2011, 1:32 PM  LOS: 3 days

## 2011-11-09 NOTE — Evaluation (Signed)
Occupational Therapy Evaluation Patient Details Name: [name] MRN: [id number] DOB: 1940-04-30 Today's Date: 11/09/2011 Time: 1324-4010 OT Time Calculation (min): 22 min  OT Assessment / Plan / Recommendation Clinical Impression  Pt admitted with endocarditis.  Reports some endurance issues, but is independent in ADL and mobility.  Instructed pt in energy conservation.  No further OT needs.    OT Assessment  Patient does not need any further OT services    Follow Up Recommendations  Supervision - Intermittent    Barriers to Discharge      Equipment Recommendations  None recommended by OT    Recommendations for Other Services    Frequency       Precautions / Restrictions     Pertinent Vitals/Pain     ADL  Eating/Feeding: Simulated;Independent Where Assessed - Eating/Feeding: Chair Grooming: Performed;Wash/dry hands;Independent Where Assessed - Grooming: Unsupported standing Upper Body Bathing: Simulated;Independent Where Assessed - Upper Body Bathing: Unsupported sitting Lower Body Bathing: Simulated;Independent Where Assessed - Lower Body Bathing: Unsupported sitting;Supported sit to stand Upper Body Dressing: Performed;Independent Where Assessed - Upper Body Dressing: Unsupported sitting Lower Body Dressing: Performed;Independent Where Assessed - Lower Body Dressing: Supported sit to Pharmacist, hospital: Performed;Independent Toilet Transfer Method: Sit to Barista: Regular height toilet Toileting - Clothing Manipulation and Hygiene: Simulated;Independent Where Assessed - Toileting Clothing Manipulation and Hygiene: Sit to stand from 3-in-1 or toilet Equipment Used: Gait belt Transfers/Ambulation Related to ADLs: Pt is transferring and ambulating in room independently. ADL Comments: Pt can perform ADl independently.    OT Diagnosis:    OT Problem List:   OT Treatment Interventions:     OT Goals    Visit Information  Last OT  Received On: 11/09/11 Assistance Needed: +1    Subjective Data  Subjective: " My wife just got out of the hospital and I am worried about her care." Patient Stated Goal: Clear up infection.   Prior Functioning  Vision/Perception  Home Living Lives With: Spouse Available Help at Discharge: Family Type of Home: House Home Access: Stairs to enter [employer] of Steps: 5 Entrance Stairs-Rails: Left;Right Home Layout: One level;Able to live on main level with bedroom/bathroom Bathroom Shower/Tub: Tub/shower unit;Curtain Bathroom Toilet: Handicapped height Home Adaptive Equipment: Walker - rolling;Straight cane;Grab bars in shower;Other (comment);Shower chair with back Prior Function Level of Independence: Independent Able to Take Stairs?: Yes Driving: Yes Comments: Pt is the caretaker of his wife. Communication Communication: No difficulties Dominant Hand: Right      Cognition  Overall Cognitive Status: Appears within functional limits for tasks assessed/performed Arousal/Alertness: Awake/alert Orientation Level: Oriented X4 / Intact Behavior During Session: [facility] for tasks performed    Extremity/Trunk Assessment Right Upper Extremity Assessment RUE ROM/Strength/Tone: Within functional levels Left Upper Extremity Assessment LUE ROM/Strength/Tone: Within functional levels Trunk Assessment Trunk Assessment: Normal   Mobility Bed Mobility Bed Mobility: Not assessed Transfers Transfers: Sit to Stand;Stand to Sit Sit to Stand: 7: Independent Stand to Sit: 7: Independent   Exercise    Balance    End of Session OT - End of Session Activity Tolerance: Patient tolerated treatment well Patient left: in chair;with call bell/phone within reach  GO     Evern Bio 11/09/2011, 10:43 AM [phone]

## 2011-11-09 NOTE — Progress Notes (Signed)
Infectious Disease Consult: Reason for consult - Bacteremia  71 yo male with a PMH of CAD, subvalvular aortic stenosis, 2 episodes of native valve endocarditis (S. Salivarius and S. Viridans), presented with fevers for the past 2 weeks of up to 101.4 F, chills occasional hotflash, and low back pain. He denies nightsweats and headaches. He saw his PCP who emperically treated him for a possible UTI with cipro and hydrocodone. The antibiotic course did not improve his symptoms of fevers and chills. His back pain did improve. As his fever continued, he decided to go back to his PCP on 7/31. Blood cultures were drawn and he was given Doxy. Pt denies recent travel or history of tick bite. Dr. Jacinto Halim (cardiology) performed a repeat TTE for concern of endocarditis. No vegetations were noted on TTE, EF was 60-65%, mild AR, subvalvular aortic stenosis, moderate AV leaflet thickening with mild calcifications and LVOT obstruction, with moderate MR. Blood cultures grew Gram positive cocci and he was referred to the ID clinic for recurrent subacute endocarditis. 1st episode of endocarditis showed strep. salivarius and was initially treated with vanc and gent but discharged on vanc for 4 weeks. 2nd episode of endocarditis involved Strep. Viridians from a possible dental abscess and had a root canal done towards the end of a 6 week course of ceftriaxone.  Pt was directly admitted from ID clinic by Dr. Drue Second to initiate antibiotics. Vanc and Ceftriaxone were started as emperic regimen.   Subjective: Patient says he is feeling much better when compared to admission day. Pt has no complaints, and no overnight problems.  Objective: Vital signs in last 24 hours: Filed Vitals:   11/08/11 1500 11/08/11 2018 11/09/11 0429 11/09/11 1003  BP: 105/52 91/59 95/59  117/70  Pulse: 67 96 96   Temp: 97.5 F (36.4 C) 98.9 F (37.2 C) 98.6 F (37 C)   TempSrc: Oral Oral Oral   Resp: 16 17 17    Height:      Weight:      SpO2:  99% 97% 95%    Weight change:   Intake/Output Summary (Last 24 hours) at 11/09/11 1221 Last data filed at 11/09/11 0730  Gross per 24 hour  Intake    240 ml  Output    850 ml  Net   -610 ml   Physical Exam: General: resting in chair beside bed HEENT: PERRL, EOMI, no scleral icterus, poor dentition Cardiac: systolic murmurs, irregular rhythm, tachycardia  Pulm: clear to auscultation bilaterally, moving normal volumes of air Abd: soft, nontender, nondistended, BS present Ext: warm and well perfused, no pedal edema Neuro: alert and oriented X3, cranial nerves II-XII grossly intact  Lab Results: Basic Metabolic Panel:  Lab 11/09/11 1610 11/08/11 0500  NA 136 136  K 3.9 4.1  CL 100 100  CO2 24 26  GLUCOSE 106* 106*  BUN 14 16  CREATININE 0.97 1.13  CALCIUM 8.6 8.6  MG -- --  PHOS -- --   Liver Function Tests:  Lab 11/07/11 0650  AST 20  ALT 11  ALKPHOS 66  BILITOT 0.5  PROT 6.5  ALBUMIN 3.3*   CBC:  Lab 11/09/11 0620 11/08/11 0500 11/06/11 1246  WBC 5.9 6.6 --  NEUTROABS -- -- 8.2*  HGB 9.9* 9.9* --  HCT 29.2* 29.5* --  MCV 88.2 89.1 --  PLT 187 160 --   Urinalysis:  Lab 11/06/11 1932  COLORURINE YELLOW  LABSPEC 1.018  PHURINE 6.0  GLUCOSEU NEGATIVE  HGBUR NEGATIVE  BILIRUBINUR NEGATIVE  KETONESUR 15*  PROTEINUR NEGATIVE  UROBILINOGEN 0.2  NITRITE NEGATIVE  LEUKOCYTESUR NEGATIVE   Micro Results: Recent Results (from the past 240 hour(s))  CULTURE, BLOOD (SINGLE)     Status: Normal   Collection Time   11/06/11 12:46 PM      Component Value Range Status Comment   Culture STREPTOCOCCUS GROUP D;high probability for S.bovis   Final    Organism ID, Bacteria STREPTOCOCCUS GROUP D;high probability for S.bovis   Final   CULTURE, BLOOD (SINGLE)     Status: Normal   Collection Time   11/06/11 12:51 PM      Component Value Range Status Comment   Organism ID, Bacteria STREPTOCOCCUS GROUP D;high probability for S.bovis   Final   CULTURE, BLOOD (ROUTINE X 2)      Status: Normal (Preliminary result)   Collection Time   11/07/11 12:20 AM      Component Value Range Status Comment   Specimen Description BLOOD LEFT ARM   Final    Special Requests BOTTLES DRAWN AEROBIC AND ANAEROBIC 10CC   Final    Culture  Setup Time 11/07/2011 09:06   Final    Culture     Final    Value:        BLOOD CULTURE RECEIVED NO GROWTH TO DATE CULTURE WILL BE HELD FOR 5 DAYS BEFORE ISSUING A FINAL NEGATIVE REPORT   Report Status PENDING   Incomplete   CULTURE, BLOOD (ROUTINE X 2)     Status: Normal (Preliminary result)   Collection Time   11/07/11 12:24 AM      Component Value Range Status Comment   Specimen Description BLOOD LEFT WRIST   Final    Special Requests BOTTLES DRAWN AEROBIC ONLY 10CC   Final    Culture  Setup Time 11/07/2011 09:05   Final    Culture     Final    Value:        BLOOD CULTURE RECEIVED NO GROWTH TO DATE CULTURE WILL BE HELD FOR 5 DAYS BEFORE ISSUING A FINAL NEGATIVE REPORT   Report Status PENDING   Incomplete    Medications: I have reviewed the patient's current medications. Scheduled Meds:   . cefTRIAXone (ROCEPHIN)  IV  2 g Intravenous Q12H  . cholecalciferol  2,000 Units Oral Daily  . docusate sodium  100 mg Oral BID  . metoprolol succinate  25 mg Oral Daily  . multivitamin with minerals  1 tablet Oral Daily  . pantoprazole  40 mg Oral Q0600  . simvastatin  20 mg Oral q1800  . sodium chloride  1 spray Each Nare QID  . sodium chloride  10 mL Intravenous Q12H  . vitamin C  500 mg Oral BID  . vitamin E  400 Units Oral BID  . DISCONTD: vancomycin  1,000 mg Intravenous Q12H   Continuous Infusions:  PRN Meds:.acetaminophen, acetaminophen, morphine injection, ondansetron (ZOFRAN) IV, ondansetron, oxyCODONE  Assessment/Plan with focus on ID:  Bacteremia/Acute Bacterial Endocarditis: As per Pt history, the patient has had multiple episodes of bacterial endocarditis and during this visit his blood cultures showed a Strep Bovis species. Finding  the source of the bacteria is vital for Pt's health. As patient has a history of cardiac valve abnormality and a history of poor dentition (with root canal), we would like to identify source. TTE and TEE have been negative for vegetation.  - CT maxillofacial with contrast - Orthopantogram - Step Bovis is known to also be a result of GI malignancy, specifically  colon cancer. Pt has not had a colonoscopy in the past, therefore a colonoscopy will be considered if imaging is negative. - Repeat blood cultures to see if therapy is effective.  - Continue Ceftriaxone (Rocephin)  -PICC line has been ordered     LOS: 3 days   This is a Psychologist, occupational Note.  The care of the patient was discussed with Dr. Daiva Eves and the assessment and plan formulated with their assistance.  Please see their attached note for official documentation of the daily encounter.  Verlin Grills 11/09/2011, 12:21 PM

## 2011-11-09 NOTE — Progress Notes (Signed)
Internal Medicine Teaching Service Attending Note Date: 11/09/2011  Patient name: [name]  Medical record number: [id number]  Date of birth: 04/01/1941    This patient has been seen and discussed with the house staff. Please see their note for complete details. I concur with their findings with the following additions/corrections:  71 year old man with valvular heart disease well known to me, had creamy with Streptococcus salivariousMIC of 0.75,    with intermediate penicillin sensitivity sp vancomycin monotherapy, in January of 2012, then viridans streptococcal (PCN sensitive) bacteremia treated with clinical diagnosis of Native valve Endocarditis with 6 week course of rocephin 2g daily. He was found to have an infection in his right mandible and underwent root canal. He recently developed fevers, chills and malaise and has been found to now have Streptocococcus Bovis Bacteremia. TEE is clean.  This gentleman NEEDS REPEAT TREATMENT FOR NATIVE VALVE ENDOCARDITIS WITH IV ROCEPHIN FOR MINIMUM OF 4 WEEKS FROM DATE OF FIRST NEGATIVE CULTURE  HE ALSO NEEDS INVESTIGATION FOR SOURCE:  WILL START WITH CT MAXILLOFACIAL AND PANOREX  IF UNREVEALING WILL GET CT ABDOMEN AND PELVIS  HE IS GOING TO NEED TO BE SEEN BY A DENTIST AND OR ORAL SURGEON FOR CLOSE EXAM  HE WILL NEED COLONOSCOPY  GIVEN ISOLATION OF S BOVIS FROM BLOOD CULTURE AND ITS KNOWN ASSOCIATION WITH COLON CANCER.  Paulette Blanch Dam 11/09/2011, 4:03 PM

## 2011-11-09 NOTE — Consult Note (Signed)
[facility] Consult Note  Referring Provider: No ref. provider found Primary Care Physician:  Juline Patch, MD Primary Gastroenterologist:  Dr.  Antony Contras Complaint: Strep bovis endocarditis HPI: [name] is an 71 y.o.  white male who was admitted with endocarditis with blood culture showing strep bovis. Due to its association with possible colonic neoplasm we're asked see the patient for possible colonoscopy. He has never had a colonoscopy before. He has no family history of GI malignancy and other than occasional constipation he has no real GI symptoms. He was moderately anemic on admission. Past Medical History  Diagnosis Date  . Heart murmur   . Coronary artery disease   . Endocarditis   . Arthritis   . Hypertension     Past Surgical History  Procedure Date  . Cardiac catheterization     Medications Prior to Admission  Medication Sig Dispense Refill  . Ascorbic Acid (VITAMIN C) 500 MG tablet Take 500 mg by mouth 2 (two) times daily.        Marland Kitchen aspirin 325 MG tablet Take 325 mg by mouth every 6 (six) hours as needed. For fever/pain      . cholecalciferol (VITAMIN D) 1000 UNITS tablet Take 2,000 Units by mouth daily.       . Coenzyme Q10 (EQL COQ10) 300 MG CAPS Take 1 capsule by mouth daily.        . Ginkgo Biloba 60 MG CAPS Take 1 capsule by mouth daily.        Marland Kitchen glucosamine-chondroitin 500-400 MG tablet Take 2 tablets by mouth 2 (two) times daily.      Marland Kitchen HAWTHORN BERRY PO Take 1 capsule by mouth daily.       . Horse Chestnut 300 MG CAPS Take 1 capsule by mouth daily.        . metoprolol succinate (TOPROL-XL) 25 MG 24 hr tablet Take 25 mg by mouth daily.      . Multiple Vitamin (MULTIVITAMIN WITH MINERALS) TABS Take 1 tablet by mouth daily.      Marland Kitchen omeprazole (PRILOSEC) 10 MG capsule Take 10 mg by mouth daily as needed. For upset stomach      . OVER THE COUNTER MEDICATION Take 1,000 mg by mouth daily. BILLBERRY CAPSULE 1000MG       . pravastatin (PRAVACHOL) 40  MG tablet Take 40 mg by mouth daily.      . saw palmetto 160 MG capsule Take 160 mg by mouth.      . vitamin E 400 UNIT capsule Take 400 Units by mouth 2 (two) times daily.        Allergies:  Allergies  Allergen Reactions  . Penicillins Rash    History reviewed. No pertinent family history.  Social History:  reports that he has never smoked. He has never used smokeless tobacco. He reports that he does not drink alcohol or use illicit drugs.  Review of Systems: negative except as above   Blood pressure 103/64, pulse 100, temperature 98.6 F (37 C), temperature source Oral, resp. rate 18, height 6' (1.829 m), weight 84.732 kg (186 lb 12.8 oz), SpO2 100.00%. Head: Normocephalic, without obvious abnormality, atraumatic Neck: no adenopathy, no carotid bruit, no JVD, supple, symmetrical, trachea midline and thyroid not enlarged, symmetric, no tenderness/mass/nodules Resp: clear to auscultation bilaterally Cardio: regular rate and rhythm, S1, S2 normal, no murmur, click, rub or gallop GI: Soft nondistended with normoactive bowel sounds. No hepatomegaly masses or guarding. Extremities: extremities normal, atraumatic, no cyanosis or edema  Results for orders placed during the hospital encounter of 11/06/11 (from the past 48 hour(s))  CBC     Status: Abnormal   Collection Time   11/08/11  5:00 AM      Component Value Range Comment   WBC 6.6  4.0 - 10.5 K/uL    RBC 3.31 (*) 4.22 - 5.81 MIL/uL    Hemoglobin 9.9 (*) 13.0 - 17.0 g/dL    HCT 16.1 (*) 09.6 - 52.0 %    MCV 89.1  78.0 - 100.0 fL    MCH 29.9  26.0 - 34.0 pg    MCHC 33.6  30.0 - 36.0 g/dL    RDW 04.5  40.9 - 81.1 %    Platelets 160  150 - 400 K/uL   BASIC METABOLIC PANEL     Status: Abnormal   Collection Time   11/08/11  5:00 AM      Component Value Range Comment   Sodium 136  135 - 145 mEq/L    Potassium 4.1  3.5 - 5.1 mEq/L    Chloride 100  96 - 112 mEq/L    CO2 26  19 - 32 mEq/L    Glucose, Bld 106 (*) 70 - 99 mg/dL     BUN 16  6 - 23 mg/dL    Creatinine, Ser 9.14  0.50 - 1.35 mg/dL    Calcium 8.6  8.4 - 78.2 mg/dL    GFR calc non Af Amer 64 (*) >90 mL/min    GFR calc Af Amer 75 (*) >90 mL/min   CBC     Status: Abnormal   Collection Time   11/09/11  6:20 AM      Component Value Range Comment   WBC 5.9  4.0 - 10.5 K/uL    RBC 3.31 (*) 4.22 - 5.81 MIL/uL    Hemoglobin 9.9 (*) 13.0 - 17.0 g/dL    HCT 95.6 (*) 21.3 - 52.0 %    MCV 88.2  78.0 - 100.0 fL    MCH 29.9  26.0 - 34.0 pg    MCHC 33.9  30.0 - 36.0 g/dL    RDW 08.6  57.8 - 46.9 %    Platelets 187  150 - 400 K/uL   BASIC METABOLIC PANEL     Status: Abnormal   Collection Time   11/09/11  6:20 AM      Component Value Range Comment   Sodium 136  135 - 145 mEq/L    Potassium 3.9  3.5 - 5.1 mEq/L    Chloride 100  96 - 112 mEq/L    CO2 24  19 - 32 mEq/L    Glucose, Bld 106 (*) 70 - 99 mg/dL    BUN 14  6 - 23 mg/dL    Creatinine, Ser 6.29  0.50 - 1.35 mg/dL    Calcium 8.6  8.4 - 52.8 mg/dL    GFR calc non Af Amer 82 (*) >90 mL/min    GFR calc Af Amer >90  >90 mL/min   VANCOMYCIN, TROUGH     Status: Normal   Collection Time   11/09/11  6:20 AM      Component Value Range Comment   Vancomycin Tr 15.6  10.0 - 20.0 ug/mL    No results found.  Assessment: Strep bovis endocarditis and in a patient with anemia and no prior colon screening. Plan:  Needs colonoscopy. Discharge plans not clear at the moment and this could be done either as inpatient or outpatient. Would  like to know from cardiology and ID standpoint that it is acceptable to proceed before pursuing this as an inpatient. He will also discuss with his wife whether he would like to proceed as inpatient or outpatient. Will followup in the morning. Benino Korinek C 11/09/2011, 3:25 PM

## 2011-11-09 NOTE — Telephone Encounter (Signed)
Victorino Dike from [employer] called and advised of a critical lab on this patient. She advised the blood culture was positive for Strep Group D. Advised her the patient is currently in the hospital. And that I will inform Dr Drue Second asap.

## 2011-11-09 NOTE — Care Management Note (Unsigned)
    Page 1 of 1   11/09/2011     12:02:41 PM   CARE MANAGEMENT NOTE 11/09/2011  Patient:  MCADOO, MUZQUIZ   Account Number:  [id number]  Date Initiated:  11/09/2011  Documentation initiated by:  SIMMONS,Lonetta Blassingame  Subjective/Objective Assessment:   ADMITTED WITH ENDOCARDITIS; LIVES AT HOME WITH DISABLED WIFE- LOIS; WAS IPTA; USES [facility] ON [address] FOR RX.     Action/Plan:   DISCHARGE PLANNING DISCUSSED AT BEDSIDE.   Anticipated DC Date:  11/10/2011   Anticipated DC Plan:  HOME W HOME HEALTH SERVICES      DC Planning Services  CM consult      Choice offered to / List presented to:             Status of service:  In process, will continue to follow Medicare Important Message given?   (If response is "NO", the following Medicare IM given date fields will be blank) Date Medicare IM given:   Date Additional Medicare IM given:    Discharge Disposition:    Per UR Regulation:  Reviewed for med. necessity/level of care/duration of stay  If discussed at Long Length of Stay Meetings, dates discussed:    Comments:  11/09/11  1201  Saranne Crislip SIMMONS RN, BSN [phone] REFERRAL PLACED TO [facility] FOR HOME IV ABX PER PT CHOICE; MD- PLEASE ORDER.  NCM WILL FOLLOWING.

## 2011-11-10 ENCOUNTER — Other Ambulatory Visit (HOSPITAL_COMMUNITY): Payer: Self-pay

## 2011-11-10 ENCOUNTER — Inpatient Hospital Stay (HOSPITAL_COMMUNITY): Payer: Medicare Other

## 2011-11-10 ENCOUNTER — Encounter (HOSPITAL_COMMUNITY): Payer: Self-pay | Admitting: Cardiology

## 2011-11-10 DIAGNOSIS — K6389 Other specified diseases of intestine: Secondary | ICD-10-CM

## 2011-11-10 LAB — BASIC METABOLIC PANEL
CO2: 30 mEq/L (ref 19–32)
Calcium: 8.7 mg/dL (ref 8.4–10.5)
Creatinine, Ser: 1.06 mg/dL (ref 0.50–1.35)
GFR calc non Af Amer: 70 mL/min — ABNORMAL LOW (ref >90)
Glucose, Bld: 105 mg/dL — ABNORMAL HIGH (ref 70–99)

## 2011-11-10 LAB — HIV ANTIBODY (ROUTINE TESTING W REFLEX): HIV: NONREACTIVE

## 2011-11-10 MED ORDER — SODIUM CHLORIDE 0.9 % IJ SOLN
10.0000 mL | INTRAMUSCULAR | Status: DC | PRN
  Administered 2011-11-12 (×2): 10 mL

## 2011-11-10 MED ORDER — PEG 3350-KCL-NA BICARB-NACL 420 G PO SOLR
4000.0000 mL | Freq: Once | ORAL | Status: AC
  Administered 2011-11-10: 4000 mL via ORAL
  Filled 2011-11-10: qty 4000

## 2011-11-10 MED ORDER — IOHEXOL 300 MG/ML  SOLN
100.0000 mL | Freq: Once | INTRAMUSCULAR | Status: AC | PRN
  Administered 2011-11-10: 100 mL via INTRAVENOUS

## 2011-11-10 MED ORDER — IOHEXOL 300 MG/ML  SOLN
20.0000 mL | INTRAMUSCULAR | Status: AC
  Administered 2011-11-10 (×2): 20 mL via ORAL

## 2011-11-10 NOTE — Progress Notes (Signed)
[facility]    Date of Admission:  11/06/2011   Total days of antibiotics 5        Rocephin Day 5         Acute bacterial endocarditis HTN (hypertension) Subaortic stenosis      . cefTRIAXone (ROCEPHIN)  IV  2 g Intravenous Q12H  . cholecalciferol  2,000 Units Oral Daily  . docusate sodium  100 mg Oral BID  . iohexol  20 mL Oral Q1 Hr x 2  . metoprolol succinate  25 mg Oral Daily  . multivitamin with minerals  1 tablet Oral Daily  . pantoprazole  40 mg Oral Q0600  . simvastatin  20 mg Oral q1800  . sodium chloride  1 spray Each Nare QID  . sodium chloride  10 mL Intravenous Q12H  . vitamin C  500 mg Oral BID  . vitamin E  400 Units Oral BID  . DISCONTD: vancomycin  1,000 mg Intravenous Q12H    Subjective: Pt complains of gas and mild headache. No complaints of chest pain, abdominal pain, or SOB.  Objective: Temp:  [98.2 F (36.8 C)-98.6 F (37 C)] 98.5 F (36.9 C) (08/06 0455) Pulse Rate:  [93-101] 93  (08/06 0455) Resp:  [16-18] 16  (08/06 0455) BP: (96-117)/(59-70) 97/59 mmHg (08/06 0455) SpO2:  [96 %-100 %] 96 % (08/06 0455)  Physical Exam:  General: resting in chair beside bed  HEENT: PERRL, EOMI, no scleral icterus, poor dentition  Cardiac: systolic murmurs, irregular rhythm, tachycardia  Pulm: clear to auscultation bilaterally, moving normal volumes of air  Abd: soft, nontender, nondistended, BS present  Ext: warm and well perfused, no pedal edema  Neuro: alert and oriented X3, cranial nerves II-XII grossly intact   Lab Results Lab Results  Component Value Date   WBC 5.9 11/09/2011   HGB 9.9* 11/09/2011   HCT 29.2* 11/09/2011   MCV 88.2 11/09/2011   PLT 187 11/09/2011    Lab Results  Component Value Date   CREATININE 1.06 11/10/2011   BUN 15 11/10/2011   NA 137 11/10/2011   K 3.8 11/10/2011   CL 100 11/10/2011   CO2 30 11/10/2011    Lab Results  Component Value Date   ALT 11 11/07/2011   AST 20 11/07/2011   ALKPHOS 66 11/07/2011   BILITOT  0.5 11/07/2011      Microbiology: Recent Results (from the past 240 hour(s))  CULTURE, BLOOD (SINGLE)     Status: Normal   Collection Time   11/06/11 12:46 PM      Component Value Range Status Comment   Culture STREPTOCOCCUS GROUP D;high probability for S.bovis   Final    Organism ID, Bacteria STREPTOCOCCUS GROUP D;high probability for S.bovis   Final   CULTURE, BLOOD (SINGLE)     Status: Normal   Collection Time   11/06/11 12:51 PM      Component Value Range Status Comment   Organism ID, Bacteria STREPTOCOCCUS GROUP D;high probability for S.bovis   Final   CULTURE, BLOOD (ROUTINE X 2)     Status: Normal (Preliminary result)   Collection Time   11/07/11 12:20 AM      Component Value Range Status Comment   Specimen Description BLOOD LEFT ARM   Final    Special Requests BOTTLES DRAWN AEROBIC AND ANAEROBIC 10CC   Final    Culture  Setup Time 11/07/2011 09:06   Final    Culture     Final  Value:        BLOOD CULTURE RECEIVED NO GROWTH TO DATE CULTURE WILL BE HELD FOR 5 DAYS BEFORE ISSUING A FINAL NEGATIVE REPORT   Report Status PENDING   Incomplete   CULTURE, BLOOD (ROUTINE X 2)     Status: Normal (Preliminary result)   Collection Time   11/07/11 12:24 AM      Component Value Range Status Comment   Specimen Description BLOOD LEFT WRIST   Final    Special Requests BOTTLES DRAWN AEROBIC ONLY 10CC   Final    Culture  Setup Time 11/07/2011 09:05   Final    Culture     Final    Value:        BLOOD CULTURE RECEIVED NO GROWTH TO DATE CULTURE WILL BE HELD FOR 5 DAYS BEFORE ISSUING A FINAL NEGATIVE REPORT   Report Status PENDING   Incomplete   CULTURE, BLOOD (ROUTINE X 2)     Status: Normal (Preliminary result)   Collection Time   11/09/11  2:24 PM      Component Value Range Status Comment   Specimen Description BLOOD RIGHT ARM   Final    Special Requests BOTTLES DRAWN AEROBIC AND ANAEROBIC 10CC   Final    Culture  Setup Time 11/09/2011 22:00   Final    Culture     Final    Value:        BLOOD  CULTURE RECEIVED NO GROWTH TO DATE CULTURE WILL BE HELD FOR 5 DAYS BEFORE ISSUING A FINAL NEGATIVE REPORT   Report Status PENDING   Incomplete   CULTURE, BLOOD (ROUTINE X 2)     Status: Normal (Preliminary result)   Collection Time   11/09/11  2:28 PM      Component Value Range Status Comment   Specimen Description BLOOD LEFT ARM   Final    Special Requests BOTTLES DRAWN AEROBIC AND ANAEROBIC 10CC   Final    Culture  Setup Time 11/09/2011 22:00   Final    Culture     Final    Value:        BLOOD CULTURE RECEIVED NO GROWTH TO DATE CULTURE WILL BE HELD FOR 5 DAYS BEFORE ISSUING A FINAL NEGATIVE REPORT   Report Status PENDING   Incomplete     Studies/Results: Dg Orthopantogram  11/09/2011  *RADIOLOGY REPORT*  Clinical Data: Bacteremia  ORTHOPANTOGRAM/PANORAMIC  Comparison: 07/01/2010  Findings: Multiple missing teeth and restorations.  No definite caries or periapical lucency to suggest abscess.  Temporomandibular joints are seated bilaterally.  Mandible intact.  IMPRESSION:  1.  No acute abnormality.  Recommend dental films for complete evaluation.  Original Report Authenticated By: Osa Craver, M.D.   Ct Maxillofacial W/cm  11/09/2011  *RADIOLOGY REPORT*  Clinical Data: The recurrent bacteremia.  Oral organisms.  CT MAXILLOFACIAL WITH CONTRAST  Technique:  Multidetector CT imaging of the maxillofacial structures was performed with intravenous contrast. Multiplanar CT image reconstructions were also generated.  Contrast: 80mL OMNIPAQUE IOHEXOL 300 MG/ML  SOLN  Comparison: Panorex from the same day.  CT of the face with contrast 07/01/2010.  Findings: Minimal lucency at the base of the root canal of tooth number 19 is stable to slightly less conspicuous than on the prior exam.  No other significant periapical disease is evident.  The teeth are somewhat obscured by metal artifact.  Limited imaging of the brain is unremarkable.  The globes and orbits are intact.  No focal mucosal or submucosal  lesion  is present to the level of the supraglottic larynx.  The salivary glands are symmetric in size and density.  No focal lesions are present within the oral cavity. No significant adenopathy is present.  Degenerative changes in the upper cervical spine are again noted with central and bilateral foraminal narrowing at C3-4, left greater than right.  Degenerative anterolisthesis is present at C4- 5.  There is chronic disc disease at C6-7.  IMPRESSION:  1.  Stable to slight decreased conspicuity focal lucency about the root canal at tooth number 19.  This is likely the sequelae of previous infection or the procedure. 2.  No other significant periapical disease. 3.  No focal abscess or oral cavity lesion to explain the patient's recurrent bacteremia. 4.  Spondylosis of the upper cervical spine.  Original Report Authenticated By: Jamesetta Orleans. MATTERN, M.D.   Dg Chest Port 1 View  11/10/2011  *RADIOLOGY REPORT*  Clinical Data: Line placement.  PORTABLE CHEST - 1 VIEW  Comparison: 11/06/2011  Findings: Right PICC line is in place with the tip in the lower SVC.  Heart is borderline in size.  Lungs are clear.  No effusions or acute bony abnormality.  IMPRESSION: Right PICC line tip in the lower SVC.  Original Report Authenticated By: Cyndie Chime, M.D.    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 Salivarius and S. Viridans), presented with fevers for the past 2 weeks of up to 101.4 F, chills occasional hotflash, and low back pain. Pt has Strep Bovis Bacteremia during this visit. TEE was negative. Bacteremia/Acute Bacterial Endocarditis:  Finding the source of the bacteria is vital for Pt's health. As patient has a history of cardiac valve abnormality and a history of poor dentition (with root canal), we would like to identify source. CT maxillofacial with contrast and Orthopantogram did not reveal an acute abnormality. PICC line  has been placed.  - Step Bovis is known to also be a result of GI malignancy, specifically colon cancer. Pt has not had a colonoscopy in the past, therefore a colonoscopy will be considered if imaging is negative.  - CT abdomen and pelvis today - Repeat blood cultures to see if therapy is effective.  - Continue Ceftriaxone (Rocephin)    [facility] [phone] pager   [phone] cell 11/10/2011, 9:34 AM  This is a Medical Student Note. The care of the patient was discussed with Dr. Daiva Eves and the assessment and plan was formulated with their assistance. Please see their note for official documentation of the patient encounter.      Infectious Disease Attending Note: Date: 11/10/2011  Patient name: [name]  Medical record number: [id number]  Date of birth: 09-06-1940    This patient has been seen and discussed with the house staff. Please see their note for complete details. I concur with their findings with the following additions/corrections:  71 year old man with recurrent bacteremias with organisms that typically cause endocarditis (Streptococcus salivarius, Viridans Streptococcus and now Streptococcus Bovis).   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 COLON CANCER   Streptococcus Bovis has known association with colonic  Neoplasia as does Clostridium Septicum but these ARE NOT the only bacterial species that can cause bacteremia in the setting of colorectal cancer. Mechanism is often due to gut translocation in setting of necrotic tumor. See for example  Rev Infect Dis. 1988;10(2):347.   "Unusual infections associated with  colorectal tumors may, in some instances, be the sole clue to the presence of a malignancy. The infections are either related to invasion of tissues or organs in close proximity to the tumor or secondary to distant seeding by transient bacteremia arising from necrotic tumors. Seven patients seen at one hospital over a 5-year period illustrate the clinical presentations of such infections. The infections identified in these seven patients include endocarditis, meningitis, nontraumatic gas gangrene, empyema, hepatic abscesses, retroperitoneal abscess, clostridial sepsis, and colovesical fistulae with urosepsis. A computer-assisted search of the English-language literature and cross-checks from other review articles identified other infections associated with colon cancer, which include nontraumatic crepitant cellulitis, suppurative thyroiditis, pericarditis, appendicitis, pulmonary microabscesses, septic arthritis, and fever of unknown origin. The clinical importance of these infections and their correlation with colorectal malignancies are reviewed."  I DO NOT SEE NEED FOR URGENT ID CONSULTATION AT [facility].  THE PATIENT DOES NEED URGENT SAMPLING OF HIS TUMOR AND REFERRAL FOR TREATMENT OF LIKELY COLON CANCER.  I CERTAINLY WELCOME A SECOND OPINION BUT FEEL THAT THIS COULD PROVIDE UNNECESSARY TRIP FOR PATIENT WHO NEEDS MALIGNANCY ADDRESSED  I spent greater than 45 minutes with the patient including greater than 50% of time in face to face counsel of the patient and in coordination of their care including time spent informing him of the findings on CT scan and need for biopsy of this mass.     Acey Lav 11/10/2011, 5:34 PM

## 2011-11-10 NOTE — Progress Notes (Signed)
Peripherally Inserted Central Catheter/Midline Placement  The IV Nurse has discussed with the patient and/or persons authorized to consent for the patient, the purpose of this procedure and the potential benefits and risks involved with this procedure.  The benefits include less needle sticks, lab draws from the catheter and patient may be discharged home with the catheter.  Risks include, but not limited to, infection, bleeding, blood clot (thrombus formation), and puncture of an artery; nerve damage and irregular heat beat.  Alternatives to this procedure were also discussed.  PICC/Midline Placement Documentation        Stacie Glaze Horton 11/10/2011, 8:47 AM

## 2011-11-10 NOTE — Progress Notes (Signed)
Patient ID: [name], male   DOB: November 09, 1940, 71 y.o.   MRN: [id number] [facility] Progress Note  Subjective: Patient feels fine. No new complaints  Objective: Vital signs in last 24 hours: Temp:  [98.2 F (36.8 C)-98.6 F (37 C)] 98.5 F (36.9 C) (08/06 0455) Pulse Rate:  [93-101] 93  (08/06 0455) Resp:  [16-18] 16  (08/06 0455) BP: (96-117)/(59-70) 97/59 mmHg (08/06 0455) SpO2:  [96 %-100 %] 96 % (08/06 0455) Weight change:    PE: Unchanged  Lab Results: Results for orders placed during the hospital encounter of 11/06/11 (from the past 24 hour(s))  CULTURE, BLOOD (ROUTINE X 2)     Status: Normal (Preliminary result)   Collection Time   11/09/11  2:24 PM      Component Value Range   Specimen Description BLOOD RIGHT ARM     Special Requests BOTTLES DRAWN AEROBIC AND ANAEROBIC 10CC     Culture  Setup Time 11/09/2011 22:00     Culture       Value:        BLOOD CULTURE RECEIVED NO GROWTH TO DATE CULTURE WILL BE HELD FOR 5 DAYS BEFORE ISSUING A FINAL NEGATIVE REPORT   Report Status PENDING    CULTURE, BLOOD (ROUTINE X 2)     Status: Normal (Preliminary result)   Collection Time   11/09/11  2:28 PM      Component Value Range   Specimen Description BLOOD LEFT ARM     Special Requests BOTTLES DRAWN AEROBIC AND ANAEROBIC 10CC     Culture  Setup Time 11/09/2011 22:00     Culture       Value:        BLOOD CULTURE RECEIVED NO GROWTH TO DATE CULTURE WILL BE HELD FOR 5 DAYS BEFORE ISSUING A FINAL NEGATIVE REPORT   Report Status PENDING    BASIC METABOLIC PANEL     Status: Abnormal   Collection Time   11/10/11  5:15 AM      Component Value Range   Sodium 137  135 - 145 mEq/L   Potassium 3.8  3.5 - 5.1 mEq/L   Chloride 100  96 - 112 mEq/L   CO2 30  19 - 32 mEq/L   Glucose, Bld 105 (*) 70 - 99 mg/dL   BUN 15  6 - 23 mg/dL   Creatinine, Ser 4.09  0.50 - 1.35 mg/dL   Calcium 8.7  8.4 - 81.1 mg/dL   GFR calc non Af Amer 70 (*) >90 mL/min   GFR calc Af Amer 81 (*)  >90 mL/min    Studies/Results: Dg Orthopantogram  11/09/2011  *RADIOLOGY REPORT*  Clinical Data: Bacteremia  ORTHOPANTOGRAM/PANORAMIC  Comparison: 07/01/2010  Findings: Multiple missing teeth and restorations.  No definite caries or periapical lucency to suggest abscess.  Temporomandibular joints are seated bilaterally.  Mandible intact.  IMPRESSION:  1.  No acute abnormality.  Recommend dental films for complete evaluation.  Original Report Authenticated By: Osa Craver, M.D.   Ct Maxillofacial W/cm  11/09/2011  *RADIOLOGY REPORT*  Clinical Data: The recurrent bacteremia.  Oral organisms.  CT MAXILLOFACIAL WITH CONTRAST  Technique:  Multidetector CT imaging of the maxillofacial structures was performed with intravenous contrast. Multiplanar CT image reconstructions were also generated.  Contrast: 80mL OMNIPAQUE IOHEXOL 300 MG/ML  SOLN  Comparison: Panorex from the same day.  CT of the face with contrast 07/01/2010.  Findings: Minimal lucency at the base of the root canal of tooth number 19  is stable to slightly less conspicuous than on the prior exam.  No other significant periapical disease is evident.  The teeth are somewhat obscured by metal artifact.  Limited imaging of the brain is unremarkable.  The globes and orbits are intact.  No focal mucosal or submucosal lesion is present to the level of the supraglottic larynx.  The salivary glands are symmetric in size and density.  No focal lesions are present within the oral cavity. No significant adenopathy is present.  Degenerative changes in the upper cervical spine are again noted with central and bilateral foraminal narrowing at C3-4, left greater than right.  Degenerative anterolisthesis is present at C4- 5.  There is chronic disc disease at C6-7.  IMPRESSION:  1.  Stable to slight decreased conspicuity focal lucency about the root canal at tooth number 19.  This is likely the sequelae of previous infection or the procedure. 2.  No other  significant periapical disease. 3.  No focal abscess or oral cavity lesion to explain the patient's recurrent bacteremia. 4.  Spondylosis of the upper cervical spine.  Original Report Authenticated By: Jamesetta Orleans. MATTERN, M.D.      Assessment: Subacute bacterial endocarditis with strep bovis positive blood cultures  Plan: CT of the abdomen and pelvis ordered by infectious disease today to look for source. Will need colonoscopy eventually whi before.ch he has never had.   [name] C 11/10/2011, 7:22 AM

## 2011-11-10 NOTE — Progress Notes (Addendum)
[facility] PROGRESS NOTE  [name] ZOX:[id number] DOB: 1940/07/27 DOA: 11/06/2011 PCP: Juline Patch, MD  Assessment/Plan: Active Problems:  Acute bacterial endocarditis  HTN (hypertension)  BPH (benign prostatic hyperplasia)  Subaortic stenosis  Back pain  Hyperlipidemia   Active Problems:  1. Acute bacterial endocarditis: Patient presents with 2 weeks of feeling non-specifically unwell, with recurrent pyrexia, fatigue and diminished effort tolerance, despite empiric antibiotic treatment. Blood cultures have grown GPC in pairs and chains, in 2:2 bottles, raising the suspicion of recurrent endocarditis, against a background of 2 previous episodes. TEE was done on 11/06/11, by Dr Yates Decamp, cardiologist, and showed no vegetations. The right ventricular systolic pressure was increased consistent with mild pulmonary hypertension. He has remained hemodynamically stable and afebrile. The culprit organism has been identified as Streptococcus Grp D/High probability for Strept Bovis, and sensitivities are still pending today. Have discussed with Dr Judyann Munson today, and she has given the go-ahead for Ceftriaxone monotherapy now day# 5, and it appears that about 4-6 weeks of antibiotic therapy would be ideal. PICC line has been placed on 11/09/11. Abdominal/pelvic CT scan of 11/10/11, showed intraluminal cecal mass (4.2 x 3.6 x 2.8 cm) is suspicious for colonic neoplasm, but no evidence of lymphadenopathy or metastatic disease. Dr Dorena Cookey, is on GI consult, and patient will likely proceed to colonoscopy ASAP. 2. Back pain: Clinically, Patient does not appear to be in acute distress from this. 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 Patient has been reassured accordingly. Not problematic at this time. 3. HTN (hypertension)BP appears controlled at this time on preadmission antihypertensives, which we have continued.  4. BPH (benign  prostatic hyperplasia): No symptoms of prostatism at this time.  5. Subaortic stenosis: No clinical evidence of CHF or decompensation at this time.  6. Dyslipidemia. On Statin.  7. Epistaxis: This was brief, on 11/07/11. Patient's platelet count is normal and he is not on anticoagulants. Saline nasal spray. No recurrence.  8. Colonic neoplasm: See above discussion. Surgical consult will be requested, although intervention will likely be preceded by colonoscopy and biopsy.   .   Code Status:  Full Code. Family Communication: Discussed in detail, with patient. Disposition Plan: Per ID.   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 Apparently, over the last 2 weeks, he has had fevers, up to 101.60F, intermittently, as well as chills, and as a matter of fact, the last episode was today. He has also had occasional low back pain. His PMD empirically treated him with Ciprofloxacin for a presumed UTI for 5 days, without appreciable improvement. He continues to feel fatigued, gets SOB on ambulating about a hundred feet on the flat On 7/31/123, he saw Dr Ricki Miller, his PMD for re-evaluation, blood cultures were taken, and he was given Doxycycline (now on Day#2). The patient denies any recent travel, no tickbites. Given his known history of previous endocarditis, Dr. Jacinto Halim, cardiologist performed who performed a TTE on 11/05/11, which showed EF 60-65%, mild AR, severe sub-aortic stenosis, moderate AV leaflet thickening with mild calcifications. AV leaflet mobility mildly restricted. LVOT obstruction. Also noted mild to moderate MR, mild MV leaflet thickening. Moderate TR, trave Pulmonic  regurgitation. No vegetations visualized.  His blood cultures from 10/07/11 grew out GPC on day #2. He was scheduled for TEE on 11/06/11.    Consultants:  Dr Sherilyn Banker, for TEE.  Dr Judyann Munson, ID.  Procedures:  TEE on 11/06/11.   MRI Thoraco-lumbar spine.   CXR.  Antibiotics:  Vancomycin/Rocephin started 11/06/11.  HPI/Subjective: No new issues.  Objective: Vital signs in last 24 hours: Temp:  [98.2 F (36.8 C)-98.5 F (36.9 C)] 98.4 F (36.9 C) (08/06 1406) Pulse Rate:  [93-102] 102  (08/06 1406) Resp:  [16-18] 18  (08/06 1406) BP: (96-102)/(59-72) 102/67 mmHg (08/06 1406) SpO2:  [96 %-100 %] 100 % (08/06 1406) Weight change:  Last BM Date: 11/09/11  Intake/Output from previous day: 08/05 0701 - 08/06 0700 In: 480 [P.O.:480] Out: 1500 [Urine:1500]     Physical Exam: General: Patient does not appear to be in obvious acute distress. Alert, communicative, fully oriented, talking in complete sentences, not short of breath at rest.  HEENT: Mild clinical pallor, no jaundice, no conjunctival injection or discharge. Hydration status appears fair.  NECK: Supple, JVP not seen, no carotid bruits, no palpable lymphadenopathy, no palpable goiter.  CHEST: Clinically clear to auscultation, no wheezes, no crackles.  HEART: Sounds 1 and 2 heard, normal, regular, systolic murmur.  ABDOMEN: Flat, soft, non-tender, no palpable organomegaly, no palpable masses, normal bowel sounds.  GENITALIA: Not examined.  LOWER EXTREMITIES: No pitting edema, palpable peripheral pulses.  MUSCULOSKELETAL SYSTEM: Generalized osteoarthritic changes, otherwise, normal.  CENTRAL NERVOUS SYSTEM: No focal neurologic deficit on gross examination.  Lab Results:  Basename 11/09/11 0620 11/08/11 0500  WBC 5.9 6.6  HGB 9.9* 9.9*  HCT 29.2* 29.5*  PLT 187 160    Basename 11/10/11 0515 11/09/11 0620  NA 137 136  K 3.8 3.9  CL 100 100  CO2 30 24  GLUCOSE 105* 106*  BUN 15 14  CREATININE 1.06 0.97    CALCIUM 8.7 8.6   Recent Results (from the past 240 hour(s))  CULTURE, BLOOD (SINGLE)     Status: Normal   Collection Time   11/06/11 12:46 PM      Component Value Range Status Comment   Culture STREPTOCOCCUS GROUP D;high probability for S.bovis   Final    Organism ID, Bacteria STREPTOCOCCUS GROUP D;high probability for S.bovis   Final   CULTURE, BLOOD (SINGLE)     Status: Normal   Collection Time   11/06/11 12:51 PM      Component Value Range Status Comment   Organism ID, Bacteria STREPTOCOCCUS GROUP D;high probability for S.bovis   Final   CULTURE, BLOOD (ROUTINE X 2)     Status: Normal (Preliminary result)   Collection Time   11/07/11 12:20 AM      Component Value Range Status Comment   Specimen Description BLOOD LEFT ARM   Final    Special Requests BOTTLES DRAWN AEROBIC AND ANAEROBIC 10CC   Final    Culture  Setup Time 11/07/2011 09:06   Final    Culture     Final    Value:        BLOOD CULTURE RECEIVED NO GROWTH TO DATE CULTURE WILL BE HELD FOR 5 DAYS BEFORE ISSUING A FINAL NEGATIVE REPORT   Report Status PENDING   Incomplete   CULTURE, BLOOD (ROUTINE X 2)     Status: Normal (Preliminary result)   Collection Time   11/07/11 12:24 AM      Component Value Range Status Comment   Specimen Description BLOOD LEFT WRIST  Final    Special Requests BOTTLES DRAWN AEROBIC ONLY 10CC   Final    Culture  Setup Time 11/07/2011 09:05   Final    Culture     Final    Value:        BLOOD CULTURE RECEIVED NO GROWTH TO DATE CULTURE WILL BE HELD FOR 5 DAYS BEFORE ISSUING A FINAL NEGATIVE REPORT   Report Status PENDING   Incomplete   CULTURE, BLOOD (ROUTINE X 2)     Status: Normal (Preliminary result)   Collection Time   11/09/11  2:24 PM      Component Value Range Status Comment   Specimen Description BLOOD RIGHT ARM   Final    Special Requests BOTTLES DRAWN AEROBIC AND ANAEROBIC 10CC   Final    Culture  Setup Time 11/09/2011 22:00   Final    Culture     Final    Value:        BLOOD CULTURE  RECEIVED NO GROWTH TO DATE CULTURE WILL BE HELD FOR 5 DAYS BEFORE ISSUING A FINAL NEGATIVE REPORT   Report Status PENDING   Incomplete   CULTURE, BLOOD (ROUTINE X 2)     Status: Normal (Preliminary result)   Collection Time   11/09/11  2:28 PM      Component Value Range Status Comment   Specimen Description BLOOD LEFT ARM   Final    Special Requests BOTTLES DRAWN AEROBIC AND ANAEROBIC 10CC   Final    Culture  Setup Time 11/09/2011 22:00   Final    Culture     Final    Value:        BLOOD CULTURE RECEIVED NO GROWTH TO DATE CULTURE WILL BE HELD FOR 5 DAYS BEFORE ISSUING A FINAL NEGATIVE REPORT   Report Status PENDING   Incomplete      Studies/Results: Dg Orthopantogram  11/09/2011  *RADIOLOGY REPORT*  Clinical Data: Bacteremia  ORTHOPANTOGRAM/PANORAMIC  Comparison: 07/01/2010  Findings: Multiple missing teeth and restorations.  No definite caries or periapical lucency to suggest abscess.  Temporomandibular joints are seated bilaterally.  Mandible intact.  IMPRESSION:  1.  No acute abnormality.  Recommend dental films for complete evaluation.  Original Report Authenticated By: Osa Craver, M.D.   Ct Abdomen Pelvis W Contrast  11/10/2011  *RADIOLOGY REPORT*  Clinical Data: Streptococcus bovis bacteremia. Possible colonic neoplasm  CT ABDOMEN AND PELVIS WITH CONTRAST  Technique:  Multidetector CT imaging of the abdomen and pelvis was performed following the standard protocol during bolus administration of intravenous contrast.  Contrast: OMNIPAQUE IOHEXOL 300 MG/ML  SOLN  Comparison: None.  Findings: Lung bases:  The right atrium is enlarged.  There is some calcification associated with the mitral valve.  There is left ventricular wall thickening, and thickening of the interventricular septum.  There is linear atelectasis in the left lower lobe. Otherwise lung bases are clear.  The liver, gallbladder, spleen, and pancreas are within normal limits.  1.3 cm right adrenal gland nodule and 1.2  cm left adrenal gland nodule are stable compared to the CT of January 2012.  There are bilateral extrarenal pelves.  No definite hydronephrosis. No evidence of renal mass.  Slight thinning of the renal cortices bilaterally.  The ureters are normal in caliber.  Urinary bladder appears normal.  Abdominal aorta is normal in caliber contains scattered atherosclerotic changes.  Iliac vasculature is normal in caliber.  Stomach is not very distended at the time imaging grossly unremarkable.  There is  a diverticulum associated with the third portion of the duodenum.  Small bowel loops are normal in caliber and wall thickness.  Terminal ileum is normal.  The proximal colon is well opacified with the oral contrast. Extending into the lumen of the cecum is an oval soft tissue mass that measures approximately 4.2 x 3.6 x 2.8 cm, associated with the medial wall of the cecum.  This is suspicious for a colonic neoplasm.  No additional colonic mass is suspected.  There are a few scattered diverticula.  No abdominal or pelvic mesenteric lymphadenopathy is identified.  No retroperitoneal lymphadenopathy. 6 mm interaortocaval lymph node on image number 39 is stable.  Prostate gland appears mildly enlarged.  Urinary bladder appears within normal limits  There is a left inguinal hernia.  A single loop of normal-appearing small bowel is at the level of the mouth of the hernia.  There is a smaller right inguinal hernia, containing fat only.  There are facet joint degenerative changes of the lumbar spine, most prominent at L4-5.  This is likely the cause of approximately 4 mm anterolisthesis of L4 on L5.  IMPRESSION:  1. 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 2. No evidence of lymphadenopathy or metastatic disease. 3. Bilateral inguinal hernias.  The left inguinal hernia contains fat as well as a small portion of a single loop of small bowel near the mouth of the hernia.  The right inguinal hernia  contains fat only. 4.  Visualized portion of the heart demonstrates cardiomegaly with left ventricular wall thickening, intraventricular septal thickening, left atrial prominence and some calcification in the vicinity of the mitral valve. 5.  Bilateral stable adrenal gland nodules.  These results will be called to the ordering clinician or representative by the Radiologist Assistant, and communication documented in the PACS Dashboard.  Original Report Authenticated By: Britta Mccreedy, M.D.   Ct Maxillofacial W/cm  11/09/2011  *RADIOLOGY REPORT*  Clinical Data: The recurrent bacteremia.  Oral organisms.  CT MAXILLOFACIAL WITH CONTRAST  Technique:  Multidetector CT imaging of the maxillofacial structures was performed with intravenous contrast. Multiplanar CT image reconstructions were also generated.  Contrast: 80mL OMNIPAQUE IOHEXOL 300 MG/ML  SOLN  Comparison: Panorex from the same day.  CT of the face with contrast 07/01/2010.  Findings: Minimal lucency at the base of the root canal of tooth number 19 is stable to slightly less conspicuous than on the prior exam.  No other significant periapical disease is evident.  The teeth are somewhat obscured by metal artifact.  Limited imaging of the brain is unremarkable.  The globes and orbits are intact.  No focal mucosal or submucosal lesion is present to the level of the supraglottic larynx.  The salivary glands are symmetric in size and density.  No focal lesions are present within the oral cavity. No significant adenopathy is present.  Degenerative changes in the upper cervical spine are again noted with central and bilateral foraminal narrowing at C3-4, left greater than right.  Degenerative anterolisthesis is present at C4- 5.  There is chronic disc disease at C6-7.  IMPRESSION:  1.  Stable to slight decreased conspicuity focal lucency about the root canal at tooth number 19.  This is likely the sequelae of previous infection or the procedure. 2.  No other  significant periapical disease. 3.  No focal abscess or oral cavity lesion to explain the patient's recurrent bacteremia. 4.  Spondylosis of the upper cervical spine.  Original Report Authenticated  By: CHRISTOPHER W. MATTERN, M.D.   Dg Chest Port 1 View  11/10/2011  *RADIOLOGY REPORT*  Clinical Data: Line placement.  PORTABLE CHEST - 1 VIEW  Comparison: 11/06/2011  Findings: Right PICC line is in place with the tip in the lower SVC.  Heart is borderline in size.  Lungs are clear.  No effusions or acute bony abnormality.  IMPRESSION: Right PICC line tip in the lower SVC.  Original Report Authenticated By: Cyndie Chime, M.D.    Medications: Scheduled Meds:    . cefTRIAXone (ROCEPHIN)  IV  2 g Intravenous Q12H  . cholecalciferol  2,000 Units Oral Daily  . docusate sodium  100 mg Oral BID  . iohexol  20 mL Oral Q1 Hr x 2  . metoprolol succinate  25 mg Oral Daily  . multivitamin with minerals  1 tablet Oral Daily  . pantoprazole  40 mg Oral Q0600  . simvastatin  20 mg Oral q1800  . sodium chloride  1 spray Each Nare QID  . sodium chloride  10 mL Intravenous Q12H  . vitamin C  500 mg Oral BID  . vitamin E  400 Units Oral BID   Continuous Infusions:   PRN Meds:.acetaminophen, acetaminophen, iohexol, iohexol, morphine injection, ondansetron (ZOFRAN) IV, ondansetron, oxyCODONE, sodium chloride    LOS: 4 days   Venie Montesinos,CHRISTOPHER  [facility] Pager [phone]. If 8PM-8AM, please contact night-coverage at [email or web address], password [facility] 11/10/2011, 2:46 PM  LOS: 4 days

## 2011-11-10 NOTE — Progress Notes (Signed)
During right arm PICC insertion, pt. became diaphoretic, c/o chest tightness, lump in throat and  c/o "seeing pink snowflakes". Completed insertion. Stat portable chest x-ray ordered. RN to room. BP and HR stable. Chest x-ray report, "PICC in lower SVC".

## 2011-11-11 ENCOUNTER — Encounter (HOSPITAL_COMMUNITY): Payer: Self-pay | Admitting: *Deleted

## 2011-11-11 ENCOUNTER — Encounter (HOSPITAL_COMMUNITY)
Admission: AD | Disposition: A | Discharge status: Home / Self Care | Payer: Self-pay | Source: Ambulatory Visit | Attending: Internal Medicine

## 2011-11-11 DIAGNOSIS — E785 Hyperlipidemia, unspecified: Secondary | ICD-10-CM

## 2011-11-11 DIAGNOSIS — D490 Neoplasm of unspecified behavior of digestive system: Secondary | ICD-10-CM

## 2011-11-11 HISTORY — PX: COLONOSCOPY: SHX5424

## 2011-11-11 LAB — CBC
HCT: 30 % — ABNORMAL LOW (ref 39.0–52.0)
Hemoglobin: 10.2 g/dL — ABNORMAL LOW (ref 13.0–17.0)
RBC: 3.38 MIL/uL — ABNORMAL LOW (ref 4.22–5.81)

## 2011-11-11 LAB — BASIC METABOLIC PANEL
BUN: 11 mg/dL (ref 6–23)
CO2: 27 mEq/L (ref 19–32)
Chloride: 101 mEq/L (ref 96–112)
Glucose, Bld: 110 mg/dL — ABNORMAL HIGH (ref 70–99)
Potassium: 3.9 mEq/L (ref 3.5–5.1)
Sodium: 139 mEq/L (ref 135–145)

## 2011-11-11 SURGERY — COLONOSCOPY
Anesthesia: Moderate Sedation

## 2011-11-11 MED ORDER — FENTANYL CITRATE 0.05 MG/ML IJ SOLN
INTRAMUSCULAR | Status: DC | PRN
  Administered 2011-11-11 (×2): 25 ug via INTRAVENOUS

## 2011-11-11 MED ORDER — DEXTROSE 5 % IV SOLN
2.0000 g | Freq: Every day | INTRAVENOUS | Status: DC
  Administered 2011-11-12: 2 g via INTRAVENOUS
  Filled 2011-11-11: qty 2

## 2011-11-11 MED ORDER — FENTANYL CITRATE 0.05 MG/ML IJ SOLN
INTRAMUSCULAR | Status: AC
  Filled 2011-11-11: qty 4

## 2011-11-11 MED ORDER — MIDAZOLAM HCL 10 MG/2ML IJ SOLN
INTRAMUSCULAR | Status: AC
  Filled 2011-11-11: qty 4

## 2011-11-11 MED ORDER — MIDAZOLAM HCL 5 MG/5ML IJ SOLN
INTRAMUSCULAR | Status: DC | PRN
  Administered 2011-11-11 (×2): 2.5 mg via INTRAVENOUS

## 2011-11-11 NOTE — Progress Notes (Signed)
Infectious Disease Attending Note Date: 11/11/2011  Patient name: [name]  Medical record number: [id number]  Date of birth: 07/12/1940    This patient has been seen and discussed with the house staff. Please see their note for complete details. I concur with their findings with the following additions/corrections:  Patient  Is sp colonoscopy and biopsy.  He needs to complete a minimum of 6 weeks of IV rocephin at 2grams daily.  I will arrange followup in [facility] to make sure he is tolerating his abx in next two weeks.   Treating his (presumed) colon cancer with resection, etc will be critical to preventing further recurrrences of bacteremia.    Paulette Blanch Dam 11/11/2011, 8:21 PM

## 2011-11-11 NOTE — Op Note (Signed)
[facility] [address] [place], [place]  [address]  COLONOSCOPY PROCEDURE REPORT  PATIENT:  [name], [name]  MR#:  [id number] BIRTHDATE:  11-15-1940, 71 yrs. old  GENDER:  male ENDOSCOPIST:  Dorena Cookey REF. BY: PROCEDURE DATE:  11/11/2011 PROCEDURE: ASA CLASS: INDICATIONS: Cecal mass on CT scan MEDICATIONS:  Fentanyl 50 mcg, Versed 5 mg DESCRIPTION OF PROCEDURE:   After the risks benefits and alternatives of the procedure were thoroughly explained, informed consent was obtained.  The a, EC-3890Li ([id number]) and EC-[id number] ([id number]) endoscope was introduced through the anus and advanced to the , without limitations.  The quality of the prep was .  The instrument was then slowly withdrawn as the colon was fully examined. <<PROCEDUREIMAGES>>  FINDINGS: 3-4 cm cecal mass, nonobstructing, terminal ileum unobstructed and intubated with no disease seen there. Appendiceal orifice was visible behind tumor. Biopsies taken. Exam otherwise normal  COMPLICATIONS:  None  IMPRESSION: Cecal mass consistent with colon cancer  RECOMMENDATIONS: Await biopsy results. Dr. [name] in attendance and discussed and tend to proceed with surgery after he is adequately treated from his bacterial endocarditis.  ______________________________ Dorena Cookey  CC:  n. eSIGNED:   Dorena Cookey at 11/11/2011 04:06 PM  Alessandra Bevels, [phone]

## 2011-11-11 NOTE — Progress Notes (Signed)
[facility]    Date of Admission:  11/06/2011   Total days of antibiotics 6        Rocephin Day 6         Active Problems:  Acute bacterial endocarditis  HTN (hypertension)  BPH (benign prostatic hyperplasia)  Subaortic stenosis  Back pain  Hyperlipidemia  Colonic mass      . cefTRIAXone (ROCEPHIN)  IV  2 g Intravenous Q12H  . cholecalciferol  2,000 Units Oral Daily  . docusate sodium  100 mg Oral BID  . iohexol  20 mL Oral Q1 Hr x 2  . metoprolol succinate  25 mg Oral Daily  . multivitamin with minerals  1 tablet Oral Daily  . pantoprazole  40 mg Oral Q0600  . polyethylene glycol-electrolytes  4,000 mL Oral Once  . simvastatin  20 mg Oral q1800  . sodium chloride  1 spray Each Nare QID  . sodium chloride  10 mL Intravenous Q12H  . vitamin C  500 mg Oral BID  . vitamin E  400 Units Oral BID    Subjective: Pt was resting in a chair next to his bed. Pt had no overnight events and no complaints. Denies headache, SOB, chest pain, abdominal pain.  Objective: Temp:  [97.8 F (36.6 C)-98.4 F (36.9 C)] 98.1 F (36.7 C) (08/07 0446) Pulse Rate:  [96-102] 97  (08/07 0446) Resp:  [16-18] 18  (08/07 0446) BP: (100-108)/(58-73) 100/58 mmHg (08/07 0446) SpO2:  [100 %] 100 % (08/07 0446)  General: resting in chair beside bed  HEENT: PERRL, EOMI, no scleral icterus, poor dentition  Cardiac: systolic murmurs, irregular rhythm, tachycardia  Pulm: clear to auscultation bilaterally, moving normal volumes of air  Abd: soft, nontender, nondistended, BS present  Ext: warm and well perfused, no pedal edema  Neuro: alert and oriented X3, cranial nerves II-XII grossly intact   Lab Results Lab Results  Component Value Date   WBC 6.3 11/11/2011   HGB 10.2* 11/11/2011   HCT 30.0* 11/11/2011   MCV 88.8 11/11/2011   PLT 236 11/11/2011    Lab Results  Component Value Date   CREATININE 1.04 11/11/2011   BUN 11 11/11/2011   NA 139 11/11/2011   K 3.9 11/11/2011   CL 101  11/11/2011   CO2 27 11/11/2011    Lab Results  Component Value Date   ALT 11 11/07/2011   AST 20 11/07/2011   ALKPHOS 66 11/07/2011   BILITOT 0.5 11/07/2011      Microbiology: Recent Results (from the past 240 hour(s))  CULTURE, BLOOD (SINGLE)     Status: Normal   Collection Time   11/06/11 12:46 PM      Component Value Range Status Comment   Culture STREPTOCOCCUS GROUP D;high probability for S.bovis   Final    Organism ID, Bacteria STREPTOCOCCUS GROUP D;high probability for S.bovis   Final   CULTURE, BLOOD (SINGLE)     Status: Normal   Collection Time   11/06/11 12:51 PM      Component Value Range Status Comment   Organism ID, Bacteria STREPTOCOCCUS GROUP D;high probability for S.bovis   Final   CULTURE, BLOOD (ROUTINE X 2)     Status: Normal (Preliminary result)   Collection Time   11/07/11 12:20 AM      Component Value Range Status Comment   Specimen Description BLOOD LEFT ARM   Final    Special Requests BOTTLES DRAWN AEROBIC AND ANAEROBIC 10CC   Final  Culture  Setup Time 11/07/2011 09:06   Final    Culture     Final    Value:        BLOOD CULTURE RECEIVED NO GROWTH TO DATE CULTURE WILL BE HELD FOR 5 DAYS BEFORE ISSUING A FINAL NEGATIVE REPORT   Report Status PENDING   Incomplete   CULTURE, BLOOD (ROUTINE X 2)     Status: Normal (Preliminary result)   Collection Time   11/07/11 12:24 AM      Component Value Range Status Comment   Specimen Description BLOOD LEFT WRIST   Final    Special Requests BOTTLES DRAWN AEROBIC ONLY 10CC   Final    Culture  Setup Time 11/07/2011 09:05   Final    Culture     Final    Value:        BLOOD CULTURE RECEIVED NO GROWTH TO DATE CULTURE WILL BE HELD FOR 5 DAYS BEFORE ISSUING A FINAL NEGATIVE REPORT   Report Status PENDING   Incomplete   CULTURE, BLOOD (ROUTINE X 2)     Status: Normal (Preliminary result)   Collection Time   11/09/11  2:24 PM      Component Value Range Status Comment   Specimen Description BLOOD RIGHT ARM   Final    Special Requests  BOTTLES DRAWN AEROBIC AND ANAEROBIC 10CC   Final    Culture  Setup Time 11/09/2011 22:00   Final    Culture     Final    Value:        BLOOD CULTURE RECEIVED NO GROWTH TO DATE CULTURE WILL BE HELD FOR 5 DAYS BEFORE ISSUING A FINAL NEGATIVE REPORT   Report Status PENDING   Incomplete   CULTURE, BLOOD (ROUTINE X 2)     Status: Normal (Preliminary result)   Collection Time   11/09/11  2:28 PM      Component Value Range Status Comment   Specimen Description BLOOD LEFT ARM   Final    Special Requests BOTTLES DRAWN AEROBIC AND ANAEROBIC 10CC   Final    Culture  Setup Time 11/09/2011 22:00   Final    Culture     Final    Value:        BLOOD CULTURE RECEIVED NO GROWTH TO DATE CULTURE WILL BE HELD FOR 5 DAYS BEFORE ISSUING A FINAL NEGATIVE REPORT   Report Status PENDING   Incomplete     Studies/Results: Dg Orthopantogram  11/09/2011  *RADIOLOGY REPORT*  Clinical Data: Bacteremia  ORTHOPANTOGRAM/PANORAMIC  Comparison: 07/01/2010  Findings: Multiple missing teeth and restorations.  No definite caries or periapical lucency to suggest abscess.  Temporomandibular joints are seated bilaterally.  Mandible intact.  IMPRESSION:  1.  No acute abnormality.  Recommend dental films for complete evaluation.  Original Report Authenticated By: Osa Craver, M.D.   Ct Abdomen Pelvis W Contrast  11/10/2011  *RADIOLOGY REPORT*  Clinical Data: Streptococcus bovis bacteremia. Possible colonic neoplasm  CT ABDOMEN AND PELVIS WITH CONTRAST  Technique:  Multidetector CT imaging of the abdomen and pelvis was performed following the standard protocol during bolus administration of intravenous contrast.  Contrast: OMNIPAQUE IOHEXOL 300 MG/ML  SOLN  Comparison: None.  Findings: Lung bases:  The right atrium is enlarged.  There is some calcification associated with the mitral valve.  There is left ventricular wall thickening, and thickening of the interventricular septum.  There is linear atelectasis in the left lower  lobe. Otherwise lung bases are clear.  The liver,  gallbladder, spleen, and pancreas are within normal limits.  1.3 cm right adrenal gland nodule and 1.2 cm left adrenal gland nodule are stable compared to the CT of January 2012.  There are bilateral extrarenal pelves.  No definite hydronephrosis. No evidence of renal mass.  Slight thinning of the renal cortices bilaterally.  The ureters are normal in caliber.  Urinary bladder appears normal.  Abdominal aorta is normal in caliber contains scattered atherosclerotic changes.  Iliac vasculature is normal in caliber.  Stomach is not very distended at the time imaging grossly unremarkable.  There is a diverticulum associated with the third portion of the duodenum.  Small bowel loops are normal in caliber and wall thickness.  Terminal ileum is normal.  The proximal colon is well opacified with the oral contrast. Extending into the lumen of the cecum is an oval soft tissue mass that measures approximately 4.2 x 3.6 x 2.8 cm, associated with the medial wall of the cecum.  This is suspicious for a colonic neoplasm.  No additional colonic mass is suspected.  There are a few scattered diverticula.  No abdominal or pelvic mesenteric lymphadenopathy is identified.  No retroperitoneal lymphadenopathy. 6 mm interaortocaval lymph node on image number 39 is stable.  Prostate gland appears mildly enlarged.  Urinary bladder appears within normal limits  There is a left inguinal hernia.  A single loop of normal-appearing small bowel is at the level of the mouth of the hernia.  There is a smaller right inguinal hernia, containing fat only.  There are facet joint degenerative changes of the lumbar spine, most prominent at L4-5.  This is likely the cause of approximately 4 mm anterolisthesis of L4 on L5.  IMPRESSION:  1. 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 2. No evidence of lymphadenopathy or metastatic disease. 3. Bilateral inguinal hernias.   The left inguinal hernia contains fat as well as a small portion of a single loop of small bowel near the mouth of the hernia.  The right inguinal hernia contains fat only. 4.  Visualized portion of the heart demonstrates cardiomegaly with left ventricular wall thickening, intraventricular septal thickening, left atrial prominence and some calcification in the vicinity of the mitral valve. 5.  Bilateral stable adrenal gland nodules.  These results will be called to the ordering clinician or representative by the Radiologist Assistant, and communication documented in the PACS Dashboard.  Original Report Authenticated By: Britta Mccreedy, M.D.   Ct Maxillofacial W/cm  11/09/2011  *RADIOLOGY REPORT*  Clinical Data: The recurrent bacteremia.  Oral organisms.  CT MAXILLOFACIAL WITH CONTRAST  Technique:  Multidetector CT imaging of the maxillofacial structures was performed with intravenous contrast. Multiplanar CT image reconstructions were also generated.  Contrast: 80mL OMNIPAQUE IOHEXOL 300 MG/ML  SOLN  Comparison: Panorex from the same day.  CT of the face with contrast 07/01/2010.  Findings: Minimal lucency at the base of the root canal of tooth number 19 is stable to slightly less conspicuous than on the prior exam.  No other significant periapical disease is evident.  The teeth are somewhat obscured by metal artifact.  Limited imaging of the brain is unremarkable.  The globes and orbits are intact.  No focal mucosal or submucosal lesion is present to the level of the supraglottic larynx.  The salivary glands are symmetric in size and density.  No focal lesions are present within the oral cavity. No significant adenopathy is present.  Degenerative changes in the upper cervical  spine are again noted with central and bilateral foraminal narrowing at C3-4, left greater than right.  Degenerative anterolisthesis is present at C4- 5.  There is chronic disc disease at C6-7.  IMPRESSION:  1.  Stable to slight decreased  conspicuity focal lucency about the root canal at tooth number 19.  This is likely the sequelae of previous infection or the procedure. 2.  No other significant periapical disease. 3.  No focal abscess or oral cavity lesion to explain the patient's recurrent bacteremia. 4.  Spondylosis of the upper cervical spine.  Original Report Authenticated By: Jamesetta Orleans. MATTERN, M.D.   Dg Chest Port 1 View  11/10/2011  *RADIOLOGY REPORT*  Clinical Data: Line placement.  PORTABLE CHEST - 1 VIEW  Comparison: 11/06/2011  Findings: Right PICC line is in place with the tip in the lower SVC.  Heart is borderline in size.  Lungs are clear.  No effusions or acute bony abnormality.  IMPRESSION: Right PICC line tip in the lower SVC.  Original Report Authenticated By: Cyndie Chime, M.D.    ID Assessment: 71 yo male with a PMH of CAD, subvalvular aortic stenosis, 2 episodes of native valve endocarditis (S. Salivarius and S. Viridans), presented with fevers for the past 2 weeks of up to 101.4 F, chills occasional hotflash, and low back pain. Pt has Strep Bovis Bacteremia during this visit. After CT Maxillofacial with contrast did not reveal a source of bacteria, CT abdomin and pelvis was done which showed an intraluminal cecal mass, likely colon cancer. This is likely the source of the bacteria.  ID Plan: 1. Intra cecal mass - urgent biopsy and staging required - scheduled for this afternoon 2. S. Bovis infection - continue Rocephin 4 week course, repeat blood culture in 4 weeks to check for clearance of bacteria  Lenox [facility] 11/11/2011, 9:13 AM  This is a Psychologist, occupational Note. The care of the patient was discussed with Dr. Daiva Eves and the assessment and plan was formulated with their assistance. Please see their note for official documentation of the patient encounter.

## 2011-11-11 NOTE — Progress Notes (Addendum)
Subjective: No specific complaints.  Ambulating in the room.  Objective: Vital signs in last 24 hours: Filed Vitals:   11/10/11 1406 11/10/11 2056 11/11/11 0446 11/11/11 1230  BP: 102/67 108/73 100/58 101/62  Pulse: 102 96 97 98  Temp: 98.4 F (36.9 C) 97.8 F (36.6 C) 98.1 F (36.7 C) 98.2 F (36.8 C)  TempSrc: Oral Oral Oral Oral  Resp: 18 16 18 18   Height:      Weight:      SpO2: 100% 100% 100% 99%   Weight change:   Intake/Output Summary (Last 24 hours) at 11/11/11 1340 Last data filed at 11/11/11 1100  Gross per 24 hour  Intake    240 ml  Output      0 ml  Net    240 ml    Physical Exam: General: Awake, Oriented, No acute distress. HEENT: EOMI. Neck: Supple CV: Grade 3/6 systolic murmur, S1 and S2 Lungs: Clear to ascultation bilaterally Abdomen: Soft, Nontender, Nondistended, +bowel sounds. Ext: Good pulses. Trace edema.  Lab Results: Basic Metabolic Panel:  Lab 11/11/11 4098 11/10/11 0515 11/09/11 0620 11/08/11 0500 11/07/11 0650  NA 139 137 136 136 133*  K 3.9 3.8 3.9 4.1 3.7  CL 101 100 100 100 97  CO2 27 30 24 26 24   GLUCOSE 110* 105* 106* 106* 128*  BUN 11 15 14 16 16   CREATININE 1.04 1.06 0.97 1.13 1.06  CALCIUM 8.9 8.7 8.6 8.6 8.5  MG -- -- -- -- --  PHOS -- -- -- -- --   Liver Function Tests:  Lab 11/07/11 0650  AST 20  ALT 11  ALKPHOS 66  BILITOT 0.5  PROT 6.5  ALBUMIN 3.3*   No results found for this basename: LIPASE:5,AMYLASE:5 in the last 168 hours No results found for this basename: AMMONIA:5 in the last 168 hours CBC:  Lab 11/11/11 0500 11/09/11 0620 11/08/11 0500 11/07/11 0650 11/06/11 1712 11/06/11 1246  WBC 6.3 5.9 6.6 9.0 9.6 --  NEUTROABS -- -- -- -- -- 8.2*  HGB 10.2* 9.9* 9.9* 10.5* 10.4* --  HCT 30.0* 29.2* 29.5* 30.6* 30.5* --  MCV 88.8 88.2 89.1 89.7 89.2 --  PLT 236 187 160 154 138* --   Cardiac Enzymes: No results found for this basename: CKTOTAL:5,CKMB:5,CKMBINDEX:5,TROPONINI:5 in the last 168 hours BNP  (last 3 results) No results found for this basename: PROBNP:3 in the last 8760 hours CBG: No results found for this basename: GLUCAP:5 in the last 168 hours No results found for this basename: HGBA1C:5 in the last 72 hours Other Labs: No components found with this basename: POCBNP:3 No results found for this basename: DDIMER:2 in the last 168 hours No results found for this basename: CHOL:2,HDL:2,LDLCALC:2,TRIG:2,CHOLHDL:2,LDLDIRECT:2 in the last 168 hours No results found for this basename: TSH,T4TOTAL,FREET3,T3FREE,FREET4,THYROIDAB in the last 168 hours No results found for this basename: VITAMINB12:2,FOLATE:2,FERRITIN:2,TIBC:2,IRON:2,RETICCTPCT:2 in the last 168 hours  Micro Results: Recent Results (from the past 240 hour(s))  CULTURE, BLOOD (SINGLE)     Status: Normal   Collection Time   11/06/11 12:46 PM      Component Value Range Status Comment   Culture STREPTOCOCCUS GROUP D;high probability for S.bovis   Final    Organism ID, Bacteria STREPTOCOCCUS GROUP D;high probability for S.bovis   Final   CULTURE, BLOOD (SINGLE)     Status: Normal   Collection Time   11/06/11 12:51 PM      Component Value Range Status Comment   Organism ID, Bacteria STREPTOCOCCUS GROUP D;high probability  for S.bovis   Final   CULTURE, BLOOD (ROUTINE X 2)     Status: Normal (Preliminary result)   Collection Time   11/07/11 12:20 AM      Component Value Range Status Comment   Specimen Description BLOOD LEFT ARM   Final    Special Requests BOTTLES DRAWN AEROBIC AND ANAEROBIC 10CC   Final    Culture  Setup Time 11/07/2011 09:06   Final    Culture     Final    Value:        BLOOD CULTURE RECEIVED NO GROWTH TO DATE CULTURE WILL BE HELD FOR 5 DAYS BEFORE ISSUING A FINAL NEGATIVE REPORT   Report Status PENDING   Incomplete   CULTURE, BLOOD (ROUTINE X 2)     Status: Normal (Preliminary result)   Collection Time   11/07/11 12:24 AM      Component Value Range Status Comment   Specimen Description BLOOD LEFT WRIST    Final    Special Requests BOTTLES DRAWN AEROBIC ONLY 10CC   Final    Culture  Setup Time 11/07/2011 09:05   Final    Culture     Final    Value:        BLOOD CULTURE RECEIVED NO GROWTH TO DATE CULTURE WILL BE HELD FOR 5 DAYS BEFORE ISSUING A FINAL NEGATIVE REPORT   Report Status PENDING   Incomplete   CULTURE, BLOOD (ROUTINE X 2)     Status: Normal (Preliminary result)   Collection Time   11/09/11  2:24 PM      Component Value Range Status Comment   Specimen Description BLOOD RIGHT ARM   Final    Special Requests BOTTLES DRAWN AEROBIC AND ANAEROBIC 10CC   Final    Culture  Setup Time 11/09/2011 22:00   Final    Culture     Final    Value:        BLOOD CULTURE RECEIVED NO GROWTH TO DATE CULTURE WILL BE HELD FOR 5 DAYS BEFORE ISSUING A FINAL NEGATIVE REPORT   Report Status PENDING   Incomplete   CULTURE, BLOOD (ROUTINE X 2)     Status: Normal (Preliminary result)   Collection Time   11/09/11  2:28 PM      Component Value Range Status Comment   Specimen Description BLOOD LEFT ARM   Final    Special Requests BOTTLES DRAWN AEROBIC AND ANAEROBIC 10CC   Final    Culture  Setup Time 11/09/2011 22:00   Final    Culture     Final    Value:        BLOOD CULTURE RECEIVED NO GROWTH TO DATE CULTURE WILL BE HELD FOR 5 DAYS BEFORE ISSUING A FINAL NEGATIVE REPORT   Report Status PENDING   Incomplete     Studies/Results: Dg Orthopantogram  11/09/2011  *RADIOLOGY REPORT*  Clinical Data: Bacteremia  ORTHOPANTOGRAM/PANORAMIC  Comparison: 07/01/2010  Findings: Multiple missing teeth and restorations.  No definite caries or periapical lucency to suggest abscess.  Temporomandibular joints are seated bilaterally.  Mandible intact.  IMPRESSION:  1.  No acute abnormality.  Recommend dental films for complete evaluation.  Original Report Authenticated By: Osa Craver, M.D.   Ct Abdomen Pelvis W Contrast  11/10/2011  *RADIOLOGY REPORT*  Clinical Data: Streptococcus bovis bacteremia. Possible colonic neoplasm   CT ABDOMEN AND PELVIS WITH CONTRAST  Technique:  Multidetector CT imaging of the abdomen and pelvis was performed following the standard protocol during bolus administration  of intravenous contrast.  Contrast: OMNIPAQUE IOHEXOL 300 MG/ML  SOLN  Comparison: None.  Findings: Lung bases:  The right atrium is enlarged.  There is some calcification associated with the mitral valve.  There is left ventricular wall thickening, and thickening of the interventricular septum.  There is linear atelectasis in the left lower lobe. Otherwise lung bases are clear.  The liver, gallbladder, spleen, and pancreas are within normal limits.  1.3 cm right adrenal gland nodule and 1.2 cm left adrenal gland nodule are stable compared to the CT of January 2012.  There are bilateral extrarenal pelves.  No definite hydronephrosis. No evidence of renal mass.  Slight thinning of the renal cortices bilaterally.  The ureters are normal in caliber.  Urinary bladder appears normal.  Abdominal aorta is normal in caliber contains scattered atherosclerotic changes.  Iliac vasculature is normal in caliber.  Stomach is not very distended at the time imaging grossly unremarkable.  There is a diverticulum associated with the third portion of the duodenum.  Small bowel loops are normal in caliber and wall thickness.  Terminal ileum is normal.  The proximal colon is well opacified with the oral contrast. Extending into the lumen of the cecum is an oval soft tissue mass that measures approximately 4.2 x 3.6 x 2.8 cm, associated with the medial wall of the cecum.  This is suspicious for a colonic neoplasm.  No additional colonic mass is suspected.  There are a few scattered diverticula.  No abdominal or pelvic mesenteric lymphadenopathy is identified.  No retroperitoneal lymphadenopathy. 6 mm interaortocaval lymph node on image number 39 is stable.  Prostate gland appears mildly enlarged.  Urinary bladder appears within normal limits  There is a left  inguinal hernia.  A single loop of normal-appearing small bowel is at the level of the mouth of the hernia.  There is a smaller right inguinal hernia, containing fat only.  There are facet joint degenerative changes of the lumbar spine, most prominent at L4-5.  This is likely the cause of approximately 4 mm anterolisthesis of L4 on L5.  IMPRESSION:  1. 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 2. No evidence of lymphadenopathy or metastatic disease. 3. Bilateral inguinal hernias.  The left inguinal hernia contains fat as well as a small portion of a single loop of small bowel near the mouth of the hernia.  The right inguinal hernia contains fat only. 4.  Visualized portion of the heart demonstrates cardiomegaly with left ventricular wall thickening, intraventricular septal thickening, left atrial prominence and some calcification in the vicinity of the mitral valve. 5.  Bilateral stable adrenal gland nodules.  These results will be called to the ordering clinician or representative by the Radiologist Assistant, and communication documented in the PACS Dashboard.  Original Report Authenticated By: Britta Mccreedy, M.D.   Ct Maxillofacial W/cm  11/09/2011  *RADIOLOGY REPORT*  Clinical Data: The recurrent bacteremia.  Oral organisms.  CT MAXILLOFACIAL WITH CONTRAST  Technique:  Multidetector CT imaging of the maxillofacial structures was performed with intravenous contrast. Multiplanar CT image reconstructions were also generated.  Contrast: 80mL OMNIPAQUE IOHEXOL 300 MG/ML  SOLN  Comparison: Panorex from the same day.  CT of the face with contrast 07/01/2010.  Findings: Minimal lucency at the base of the root canal of tooth number 19 is stable to slightly less conspicuous than on the prior exam.  No other significant periapical disease is evident.  The teeth are somewhat obscured  by metal artifact.  Limited imaging of the brain is unremarkable.  The globes and orbits are intact.   No focal mucosal or submucosal lesion is present to the level of the supraglottic larynx.  The salivary glands are symmetric in size and density.  No focal lesions are present within the oral cavity. No significant adenopathy is present.  Degenerative changes in the upper cervical spine are again noted with central and bilateral foraminal narrowing at C3-4, left greater than right.  Degenerative anterolisthesis is present at C4- 5.  There is chronic disc disease at C6-7.  IMPRESSION:  1.  Stable to slight decreased conspicuity focal lucency about the root canal at tooth number 19.  This is likely the sequelae of previous infection or the procedure. 2.  No other significant periapical disease. 3.  No focal abscess or oral cavity lesion to explain the patient's recurrent bacteremia. 4.  Spondylosis of the upper cervical spine.  Original Report Authenticated By: Jamesetta Orleans. MATTERN, M.D.   Dg Chest Port 1 View  11/10/2011  *RADIOLOGY REPORT*  Clinical Data: Line placement.  PORTABLE CHEST - 1 VIEW  Comparison: 11/06/2011  Findings: Right PICC line is in place with the tip in the lower SVC.  Heart is borderline in size.  Lungs are clear.  No effusions or acute bony abnormality.  IMPRESSION: Right PICC line tip in the lower SVC.  Original Report Authenticated By: Cyndie Chime, M.D.    Medications: I have reviewed the patient's current medications. Scheduled Meds:   . cefTRIAXone (ROCEPHIN)  IV  2 g Intravenous Q12H  . cholecalciferol  2,000 Units Oral Daily  . docusate sodium  100 mg Oral BID  . metoprolol succinate  25 mg Oral Daily  . multivitamin with minerals  1 tablet Oral Daily  . pantoprazole  40 mg Oral Q0600  . polyethylene glycol-electrolytes  4,000 mL Oral Once  . simvastatin  20 mg Oral q1800  . sodium chloride  1 spray Each Nare QID  . sodium chloride  10 mL Intravenous Q12H  . vitamin C  500 mg Oral BID  . vitamin E  400 Units Oral BID   Continuous Infusions:  PRN  Meds:.acetaminophen, acetaminophen, morphine injection, ondansetron (ZOFRAN) IV, ondansetron, oxyCODONE, sodium chloride  Assessment/Plan: Acute bacterial endocarditis Blood cultures from 11/06/2011 grew Group D streptococcus (high probability Streptococcus bovis) in 2/2 bottles. TEE was done on 11/06/11, by Dr Yates Decamp, cardiologist, and showed no vegetations. The right ventricular systolic pressure was increased consistent with mild pulmonary hypertension. He has remained hemodynamically stable and afebrile. Will need 4 week course of ceftriaxone, antibiotic since 11/06/2011.  Position ceftriaxone from 2 g IV q12h to 2 g daily, discussed with Dr. Drue Second. PICC line has been placed on 11/09/11. Abdominal/pelvic CT scan of 11/10/11, showed intraluminal cecal mass (4.2 x 3.6 x 2.8 cm) is suspicious for colonic neoplasm, but no evidence of lymphadenopathy or metastatic disease. Dr. Dorena Cookey, performed colonoscopy today which showed 3-4 cm cecal mass, nonobstructing, biopsies taken. Dr. [name], general surgery, was in attendance for colonoscopy, will likely need surgery after he is adequately treated for bacterial endocarditis.  Colonic neoplasm Patient followup with Dr. [name] with general surgery after discharge.  Patient to have surgery after being adequately treated for endocarditis.   Back pain Stable. 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   HTN (hypertension) BP appears controlled, stable.  BPH (benign prostatic hyperplasia) Stable.  Subaortic stenosis Stable.  Dyslipidemia Statin.   Epistaxis Was brief, on 11/07/11.   Prophylaxis SCDs.  Disposition Consider discharge tomorrow with antibiotics for home health and with outpatient followup with general surgery and ID.   LOS: 5 days  Genise Strack A, MD 11/11/2011, 1:40 PM

## 2011-11-12 ENCOUNTER — Encounter (HOSPITAL_COMMUNITY): Payer: Self-pay | Admitting: Gastroenterology

## 2011-11-12 DIAGNOSIS — I330 Acute and subacute infective endocarditis: Secondary | ICD-10-CM

## 2011-11-12 DIAGNOSIS — D509 Iron deficiency anemia, unspecified: Secondary | ICD-10-CM

## 2011-11-12 MED ORDER — HEPARIN SOD (PORK) LOCK FLUSH 100 UNIT/ML IV SOLN
250.0000 [IU] | INTRAVENOUS | Status: AC | PRN
  Administered 2011-11-12: 500 [IU]

## 2011-11-12 MED ORDER — GENTAMICIN SULFATE 40 MG/ML IJ SOLN
250.0000 mg | INTRAVENOUS | Status: DC
  Administered 2011-11-12: 250 mg via INTRAVENOUS
  Filled 2011-11-12: qty 6.25

## 2011-11-12 MED ORDER — DSS 100 MG PO CAPS: 100.0000 mg | ORAL_CAPSULE | Freq: Two times a day (BID) | ORAL | Status: AC

## 2011-11-12 MED ORDER — DEXTROSE 5 % IV SOLN: INTRAVENOUS | Status: DC

## 2011-11-12 MED ORDER — GENTAMICIN SULFATE 40 MG/ML IJ SOLN: 250.0000 mg | INTRAVENOUS | Status: DC

## 2011-11-12 NOTE — Consult Note (Signed)
OLUWATIMILEYIN VIVIER September 09, 1940  [id number].   Requesting MD: Dr. Betti Cruz GI: Dr. Dorena Cookey ID: Dr. Daiva Eves Chief Complaint/Reason for Consult: colonic mass HPI: This is a 71 yo male who has had about 2 weeks of generalized malaise, fevers, and chills.  He also noted some back pain, but said he had a BM and this went away.  He did see his PCP who gave him Cipro; however, his fevers persisted.  He was then started on doxycycline.  Because his symptoms persisted, he was sent to an infectious disease physician, D. Drue Second, who then decided the patient needed admission.  He has a history of 2 prior episodes of strep bacteremia and endocarditis.  He was found once again to have endocarditis.  This time it was Strep Bovis.  An abdominal CT scan was ordered and the patient was found to have a colon mass.  A colonoscopy was performed yesterday and biopsies taken. We have been asked to see for further management and possible resection.  Review of System: Please see HPI, otherwise patient denies any abdominal pain, nausea, vomiting, diarrhea, constipation, bloating, or hematochezia.  Denies CP, SOB.  All other systems are negative.  FH: No hx of GI malignancies  Past Medical History  Diagnosis Date  . Heart murmur   . Coronary artery disease   . Endocarditis   . Arthritis   . Hypertension     Past Surgical History  Procedure Date  . Cardiac catheterization   . Tee without cardioversion 11/06/2011    Procedure: TRANSESOPHAGEAL ECHOCARDIOGRAM (TEE);  Surgeon: [name], MD;  Location: [facility] ENDOSCOPY;  Service: Cardiovascular;  Laterality: N/A;  . Colonoscopy 11/11/2011    Procedure: COLONOSCOPY;  Surgeon: Barrie Folk, MD;  Location: [facility] ENDOSCOPY;  Service: Endoscopy;  Laterality: N/A;    Social History:  reports that he has never smoked. He has never used smokeless tobacco. He reports that he drinks about .6 ounces of alcohol per week. He reports that he does not use illicit drugs.  Allergies:    Allergies  Allergen Reactions  . Penicillins Rash    Medications Prior to Admission  Medication Sig Dispense Refill  . Ascorbic Acid (VITAMIN C) 500 MG tablet Take 500 mg by mouth 2 (two) times daily.        Marland Kitchen aspirin 325 MG tablet Take 325 mg by mouth every 6 (six) hours as needed. For fever/pain      . cholecalciferol (VITAMIN D) 1000 UNITS tablet Take 2,000 Units by mouth daily.       . Coenzyme Q10 (EQL COQ10) 300 MG CAPS Take 1 capsule by mouth daily.        . Ginkgo Biloba 60 MG CAPS Take 1 capsule by mouth daily.        Marland Kitchen glucosamine-chondroitin 500-400 MG tablet Take 2 tablets by mouth 2 (two) times daily.      Marland Kitchen HAWTHORN BERRY PO Take 1 capsule by mouth daily.       . Horse Chestnut 300 MG CAPS Take 1 capsule by mouth daily.        . metoprolol succinate (TOPROL-XL) 25 MG 24 hr tablet Take 25 mg by mouth daily.      . Multiple Vitamin (MULTIVITAMIN WITH MINERALS) TABS Take 1 tablet by mouth daily.      Marland Kitchen omeprazole (PRILOSEC) 10 MG capsule Take 10 mg by mouth daily as needed. For upset stomach      . OVER THE COUNTER MEDICATION Take 1,000  mg by mouth daily. BILLBERRY CAPSULE 1000MG       . pravastatin (PRAVACHOL) 40 MG tablet Take 40 mg by mouth daily.      . saw palmetto 160 MG capsule Take 160 mg by mouth.      . vitamin E 400 UNIT capsule Take 400 Units by mouth 2 (two) times daily.        Blood pressure 93/55, pulse 89, temperature 98.4 F (36.9 C), temperature source Oral, resp. rate 16, height 6' (1.829 m), weight 186 lb 12.8 oz (84.732 kg), SpO2 95.00%. Physical Exam: General: pleasant, WD, WN white male who is laying in bed in NAD HEENT: head is normocephalic, atraumatic.  Sclera are noninjected.  PERRL.  Ears and nose without any masses or lesions.  Mouth is pink and moist Heart: regular, rate, and rhythm.  Normal s1,s2. + systolic murmur. No obvious gallops, or rubs noted.  Palpable radial and pedal pulses bilaterally Lungs: CTAB, no wheezes, rhonchi, or rales noted.   Respiratory effort nonlabored Abd: soft, NT, ND, +BS, no masses or organomegaly.  He does have a small right and left inguinal hernia MS: all 4 extremities are symmetrical with no cyanosis, clubbing, or edema. Skin: warm and dry with no masses, lesions, or rashes Psych: A&Ox3 with an appropriate affect.    Results for orders placed during the hospital encounter of 11/06/11 (from the past 48 hour(s))  CBC     Status: Abnormal   Collection Time   11/11/11  5:00 AM      Component Value Range Comment   WBC 6.3  4.0 - 10.5 K/uL    RBC 3.38 (*) 4.22 - 5.81 MIL/uL    Hemoglobin 10.2 (*) 13.0 - 17.0 g/dL    HCT 82.9 (*) 56.2 - 52.0 %    MCV 88.8  78.0 - 100.0 fL    MCH 30.2  26.0 - 34.0 pg    MCHC 34.0  30.0 - 36.0 g/dL    RDW 13.0  86.5 - 78.4 %    Platelets 236  150 - 400 K/uL   BASIC METABOLIC PANEL     Status: Abnormal   Collection Time   11/11/11  5:00 AM      Component Value Range Comment   Sodium 139  135 - 145 mEq/L    Potassium 3.9  3.5 - 5.1 mEq/L    Chloride 101  96 - 112 mEq/L    CO2 27  19 - 32 mEq/L    Glucose, Bld 110 (*) 70 - 99 mg/dL    BUN 11  6 - 23 mg/dL    Creatinine, Ser 6.96  0.50 - 1.35 mg/dL    Calcium 8.9  8.4 - 29.5 mg/dL    GFR calc non Af Amer 71 (*) >90 mL/min    GFR calc Af Amer 83 (*) >90 mL/min    Ct Abdomen Pelvis W Contrast  11/10/2011  *RADIOLOGY REPORT*  Clinical Data: Streptococcus bovis bacteremia. Possible colonic neoplasm  CT ABDOMEN AND PELVIS WITH CONTRAST  Technique:  Multidetector CT imaging of the abdomen and pelvis was performed following the standard protocol during bolus administration of intravenous contrast.  Contrast: OMNIPAQUE IOHEXOL 300 MG/ML  SOLN  Comparison: None.  Findings: Lung bases:  The right atrium is enlarged.  There is some calcification associated with the mitral valve.  There is left ventricular wall thickening, and thickening of the interventricular septum.  There is linear atelectasis in the left lower lobe.  Otherwise lung  bases are clear.  The liver, gallbladder, spleen, and pancreas are within normal limits.  1.3 cm right adrenal gland nodule and 1.2 cm left adrenal gland nodule are stable compared to the CT of January 2012.  There are bilateral extrarenal pelves.  No definite hydronephrosis. No evidence of renal mass.  Slight thinning of the renal cortices bilaterally.  The ureters are normal in caliber.  Urinary bladder appears normal.  Abdominal aorta is normal in caliber contains scattered atherosclerotic changes.  Iliac vasculature is normal in caliber.  Stomach is not very distended at the time imaging grossly unremarkable.  There is a diverticulum associated with the third portion of the duodenum.  Small bowel loops are normal in caliber and wall thickness.  Terminal ileum is normal.  The proximal colon is well opacified with the oral contrast. Extending into the lumen of the cecum is an oval soft tissue mass that measures approximately 4.2 x 3.6 x 2.8 cm, associated with the medial wall of the cecum.  This is suspicious for a colonic neoplasm.  No additional colonic mass is suspected.  There are a few scattered diverticula.  No abdominal or pelvic mesenteric lymphadenopathy is identified.  No retroperitoneal lymphadenopathy. 6 mm interaortocaval lymph node on image number 39 is stable.  Prostate gland appears mildly enlarged.  Urinary bladder appears within normal limits  There is a left inguinal hernia.  A single loop of normal-appearing small bowel is at the level of the mouth of the hernia.  There is a smaller right inguinal hernia, containing fat only.  There are facet joint degenerative changes of the lumbar spine, most prominent at L4-5.  This is likely the cause of approximately 4 mm anterolisthesis of L4 on L5.  IMPRESSION:  1. 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 2. No evidence of lymphadenopathy or metastatic disease. 3. Bilateral inguinal hernias.  The  left inguinal hernia contains fat as well as a small portion of a single loop of small bowel near the mouth of the hernia.  The right inguinal hernia contains fat only. 4.  Visualized portion of the heart demonstrates cardiomegaly with left ventricular wall thickening, intraventricular septal thickening, left atrial prominence and some calcification in the vicinity of the mitral valve. 5.  Bilateral stable adrenal gland nodules.  These results will be called to the ordering clinician or representative by the Radiologist Assistant, and communication documented in the PACS Dashboard.  Original Report Authenticated By: Britta Mccreedy, M.D.   Dg Chest Port 1 View  11/10/2011  *RADIOLOGY REPORT*  Clinical Data: Line placement.  PORTABLE CHEST - 1 VIEW  Comparison: 11/06/2011  Findings: Right PICC line is in place with the tip in the lower SVC.  Heart is borderline in size.  Lungs are clear.  No effusions or acute bony abnormality.  IMPRESSION: Right PICC line tip in the lower SVC.  Original Report Authenticated By: Cyndie Chime, M.D.       Assessment/Plan 1. Strep Bovis endocarditis 2. Colon mass Patient Active Problem List  Diagnosis  . ANEMIA, IRON DEFICIENCY  . ENDOCARDITIS, BACTERIAL, SUBACUTE  . HYPERTROPHIC OBSTRUCTIVE CARDIOMYOPATHY  . SYNCOPE  . DYSPNEA ON EXERTION  . ECHOCARDIOGRAM, ABNORMAL  . HEART MURMUR, HX OF  . Acute renal insufficiency  . Viridans streptococci infection  . Streptococcus infection  . Tooth infection  . Status post PICC central line placement  . Arm pain, right  . Acute bacterial endocarditis  . HTN (hypertension)  .  BPH (benign prostatic hyperplasia)  . Subaortic stenosis  . Back pain  . Hyperlipidemia  . Colonic mass  right and left sided inguinal hernias  Plan: I will d/w Dr. Ezzard Standing.  The patient has a Strep Bovis endocarditis.  This is linked to the finding of his colon mass, as this mass can cause a translocation of the bacteria to the heart.  It is  unclear right now whether this mass is malignant or not, but it will need to be resected regardless, given its size and potential for obstruction.   I have spoken with Dr. Daiva Eves regarding the timing of his surgery to determine whether his infection should be completed and then proceed with surgery or whether he needs surgery here so the mass will not continue to propagate his infection.  He felt there was not a definite right answer, but his preference was sooner rather than later so another infection did not pop up prior to being able to proceed with surgery.  I will d/w Dr. Ezzard Standing and he will then decided further plans after his evaluation.  I have discussed all of this with the patient and he understands.  We will follow with you.  Thank you for this consultation.  OSBORNE,KELLY E 11/12/2011, 8:33 AM  The cecal polyp path is benign.  I gave the patient a copy of the path.  I still warned him that there could be an occult malignancy. I have spoken with Dr. Jeanella Cara - patient has fairly severe subaortic stenosis and needs to be hydrated for surgery. I have spoken with Dr. Daiva Eves.  The patient will require at least 2 weeks of gentamicin and 4 weeks of rocephin.  He is concerned about the patient being off antibiotics and re-seeding the infection. I have spoken with Dr. Betti Cruz who is hospitalist. Will plan d/c today.  I will schedule his right colon resection to time with the end of his antibiotics (after he has completed the gentamicin) in about 3 to 4 weeks.  Discussed thoroughly with patient.  I reviewed with the patient the findings and need for colon surgery.  I discussed both surgical and non surgical options.  I discussed the role of laparoscopic and open surgery in colon surgery.  I reviewed the risks of surgery, including, but not limited to, infection, bleeding, nerve injury, anastomotic leaks, and possibility of colostomy.  I went over the preoperative mechanical and antibiotic bowel prep  for colon surgery.  I tried to answer all questions for the patient.  I will see the patient back in the office before surgery.  Note:  The patient has bilateral inguinal hernias.  The right is mildly symptomatic. These will have to be addressed at a later date.  [name], MD, [facility] Surgery Pager: [phone] Office phone:  [phone]

## 2011-11-12 NOTE — Discharge Summary (Signed)
Physician Discharge Summary  [name] ZOX:[id number] DOB: 24-Sep-1940 DOA: 11/06/2011  PCP: Juline Patch, MD  Admit date: 11/06/2011 Discharge date: 11/12/2011  Recommendations for Outpatient Follow-up:  1. Ceftriaxone 2 g IV daily for 4 weeks from 11/06/2011. 2. Gentamicin 250 mg IV daily for 2 weeks from 11/12/2011 3. Will need weekly CBC and twice a week BMET, and have results sent to ID clinic. 4. Followup with ID, Dr. Algis Liming or Dr. Drue Second in 2-3 weeks. 5. Followup with Dr. Ezzard Standing, general surgery in 2-3 weeks.  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  Hospital Course:  Acute bacterial endocarditis Blood cultures from 11/06/2011 grew Group D streptococcus (high probability Streptococcus bovis) in 2/2 bottles, subsequent surveillance blood cultures on 11/07/2011 in 11/09/2011 show no growth to date. TEE was done on 11/06/11, by Dr Yates Decamp, cardiology, and showed no vegetations. The right ventricular systolic pressure was increased consistent with mild pulmonary hypertension. Remained hemodynamically stable and afebrile. Will need 4 week course of ceftriaxone, antibiotic since 11/06/2011.  Based on his penicillin MIC 0.12, ID started him on gentamicin on 11/12/2011 which he will need for  2 weeks.  PICC line has been placed on 11/09/11. Abdominal/pelvic CT scan of 11/10/11, showed intraluminal cecal mass (4.2 x 3.6 x 2.8 cm) is suspicious for colonic neoplasm, but no evidence of lymphadenopathy or metastatic disease. Dr. Dorena Cookey, performed colonoscopy on 11/11/2011 which showed 3-4 cm cecal mass, nonobstructing.  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  Discussed with Dr. [name], general surgery, who will arrange for outpatient followup with general surgery and outpatient surgery in 3-4 weeks before completing course of antibiotics.  Colonic mass Outpatient surgery as indicated above.   Back pain Stable. 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   HTN (hypertension) BP appears controlled, stable.  BPH (benign prostatic hyperplasia) Stable.  Subaortic stenosis Stable.  Dyslipidemia Statin.   Epistaxis Was brief, on 11/07/11.  Resolved.  Procedures:  As above.  Consultations:  Dr. Algis Liming, ID  Dr. Ezzard Standing, General Surgery  Dr. Jacinto Halim, Cardiology  Discharge Exam: Filed Vitals:   11/12/11 0532  BP: 93/55  Pulse: 89  Temp: 98.4 F (36.9 C)  Resp: 16   Filed Vitals:   11/11/11 1630 11/11/11 1640 11/11/11 2021 11/12/11 0532  BP: 106/72 122/60 105/63 93/55  Pulse:   96 89  Temp:   97.9 F (36.6 C) 98.4 F (36.9 C)  TempSrc:   Oral Oral  Resp: 17 16 16 16   Height:      Weight:      SpO2: 99% 98% 96% 95%   Discharge Instructions  Discharge Orders    Future Appointments: Provider: Department: Dept Phone: Center:   11/25/2011 9:00 AM Judyann Munson, MD [facility] [phone] [facility]  Future Orders Please Complete By Expires   Diet - low sodium heart healthy      Increase activity slowly      Discharge instructions      Comments:   Followup with Dr. Ezzard Standing, general surgery, in 2-3 weeks.  Follow up with ID, Dr. Marcha Dutton or  Dr. Algis Liming in 2-3 weeks.     Medication List  As of 11/12/2011 12:59 PM   STOP taking these medications         aspirin 325 MG tablet         TAKE these medications         cholecalciferol 1000 UNITS tablet   Commonly known as: VITAMIN D   Take 2,000 Units by mouth daily.      dextrose 5 % SOLN 100 mL with gentamicin 40 MG/ML SOLN 250 mg   Inject 250 mg into the vein daily. For 2 weeks from 11/12/2011.      dextrose 5 % SOLN 50 mL with cefTRIAXone 2 G SOLR   2 g IV daily for 4 weeks from 11/06/2011.      DSS 100 MG Caps   Take 100 mg by mouth 2 (two) times daily. Hold for diarrhea.      EQL COQ10 300 MG Caps   Generic drug: Coenzyme Q10   Take 1 capsule by mouth daily.      Ginkgo Biloba 60 MG Caps   Take 1 capsule by mouth daily.      glucosamine-chondroitin 500-400 MG tablet   Take 2 tablets by mouth 2 (two) times daily.      HAWTHORN BERRY PO   Take 1 capsule by mouth daily.      Horse Chestnut 300 MG Caps   Take 1 capsule by mouth daily.      metoprolol succinate 25 MG 24 hr tablet   Commonly known as: TOPROL-XL   Take 25 mg by mouth daily.      multivitamin with minerals Tabs   Take 1 tablet by mouth daily.      omeprazole 10 MG capsule   Commonly known as: PRILOSEC   Take 10 mg by mouth daily as needed. For upset stomach      OVER THE COUNTER MEDICATION   Take 1,000 mg by mouth daily. BILLBERRY CAPSULE 1000MG       pravastatin 40 MG tablet   Commonly known as: PRAVACHOL   Take 40 mg by mouth daily.      saw palmetto 160 MG capsule   Take 160 mg by mouth.      vitamin C 500 MG tablet   Commonly known as: ASCORBIC ACID   Take 500 mg by mouth 2 (two) times daily.      vitamin E 400 UNIT capsule   Take 400 Units by mouth 2 (two) times daily.           Follow-up Information    Follow up with PANG,RICHARD, MD. Schedule an appointment as soon as possible for a visit in 1 week.   Contact information:   [address], [address] [place] [address] [phone]       Follow up with [facility]             . Schedule an appointment as soon as possible for a visit in 3 weeks. (With Dr. Daiva Eves or Dr. Drue Second)    Contact information:   [address] [place] [address]  Follow up with NEWMAN,DAVID H, MD. Schedule an appointment as soon as possible for a visit in 2 weeks.   Contact information:   [address] [place] [address] [phone]           The results of significant diagnostics from this hospitalization (including imaging, microbiology, ancillary and laboratory) are listed below for reference.    Significant Diagnostic Studies: Dg Orthopantogram  11/09/2011  *RADIOLOGY REPORT*  Clinical Data: Bacteremia  ORTHOPANTOGRAM/PANORAMIC  Comparison: 07/01/2010  Findings: Multiple missing teeth and restorations.  No definite caries or periapical lucency to suggest abscess.  Temporomandibular joints are seated bilaterally.  Mandible intact.  IMPRESSION:  1.  No acute abnormality.  Recommend dental films for complete evaluation.  Original Report Authenticated By: Osa Craver, M.D.   X-ray Chest Pa And Lateral   11/06/2011  *RADIOLOGY REPORT*  Clinical Data: Endocarditis  CHEST - 2 VIEW  Comparison: 07/01/2010  Findings: Mild enlargement of the cardiac silhouette.  No pleural effusion or edema noted.  No airspace consolidation identified.  Review of the visualized osseous structures is unremarkable.  IMPRESSION:  1.  No acute findings.  Original Report Authenticated By: Rosealee Albee, M.D.   Mr Thoracic Spine W Wo Contrast  11/07/2011  *RADIOLOGY REPORT*  Clinical Data: Infectious endocarditis.  Febrile illness.  Thoracic and lumbar diskitis.  MRI THORACIC SPINE WITHOUT AND WITH CONTRAST  Technique:  Multiplanar and multiecho pulse sequences of the thoracic spine were obtained without and with intravenous contrast.   Contrast:  18 ml Multihance.  Comparison: None.  Findings: The alignment shows a mild dextroconvex curvature with the apex at T8.  No spondylolisthesis.  Numbering was performed from the craniocervical junction.  Spinal segmentation is anatomic. Lower cervical spondylosis is present with C6-C7 degenerative disc disease and 3 mm retrolisthesis.  There is also flattening of the C6 vertebra, partially visualized probably due to chronic degenerative disease.  Mild central stenosis at C6-C7 is partially visualized.  Bone marrow signal is within normal limits.  There is no marrow edema or effacement of normal fatty marrow to suggest diskitis or osteomyelitis.  The thoracic cord shows normal signal. The paraspinal soft tissues are within normal limits.  C6-C7 shows mild to moderate central stenosis, seen on axial and sagittal imaging with AP diameter of the thecal sac about 8 mm. Posterior ligamentum flavum redundancy contributes to the stenosis. C6-C7 right greater than left foraminal stenosis also appears to be present.  There is no thoracic central or foraminal stenosis.  Axial images are mildly technically degraded by motion artifact.  No abnormal enhancement after Gadolinium administration.  IMPRESSION: Negative thoracic spine MR for diskitis/osteomyelitis.  C6-C7 cervical spondylosis.  Original Report Authenticated By: Andreas Newport, M.D.   Mr Lumbar Spine W Wo Contrast  11/07/2011  *RADIOLOGY REPORT*  Clinical Data: Lumbar spondylodiskitis.  Endocarditis.  Bacteremia.  MRI LUMBAR SPINE WITHOUT AND WITH CONTRAST  Technique:  Multiplanar and multiecho pulse sequences of the lumbar spine were obtained without and with intravenous contrast.  Contrast:  18 ml Multihance.  Comparison: Thoracic spine MR same day.  Findings: Five lumbar type vertebral bodies.  Grade 1 anterolisthesis of L4 on L5 appears degenerative.  The paraspinal soft tissues are within normal limits.  There is a levoconvex lumbar scoliosis with the  apex at L4.  Spinal cord terminates posterior to the L1 vertebra. Bone marrow signal shows suppression of fatty marrow, which is a nonspecific finding, commonly associated with anemia, chronic  disease, cigarette smoking, and obesity.  No destructive osseous lesions.  There is no liquefaction of the disc spaces or abnormal enhancement to suggest diskitis/osteomyelitis.  No epidural abscess.  T12-L1:  Negative.  L1-L2:  Negative.  L2-L3:  Disc desiccation and shallow bulging without stenosis.  L3-L4:  Mild disc desiccation.  Central canal patent.  Mild left foraminal stenosis associated with bulging disc and short pedicles.  L4-L5:  Moderate central stenosis is present with right greater than left lateral recess stenosis.  This is multifactorial, associated with broad-based disc bulge, uncoverage of the disc from anterolisthesis and severe facet arthrosis.  Small facet effusions are present.  On post-gadolinium imaging, there is enhancement around the facet joints compatible with active arthritis.  The periarticular inflammation is minimal, and this most likely represents ordinary degenerative change rather than septic facet arthritis.  Moderate right and mild left foraminal stenosis potentially affects the exiting L4 nerves.  Anterolisthesis measures 3 mm.  L5-S1:  Negative.  IMPRESSION: 1.  L4-L5 predominant lumbar spondylosis.  Bilateral L4-L5 severe facet arthritis with small effusions. 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  Septic arthritis is considered unlikely based on the appearance.  Multifactorial spinal, lateral recess and right greater than left foraminal stenosis at L4-L5.  2.  More mild degenerative disc disease at other levels. 3.  Levoconvex lumbar scoliosis. 4.  Negative for diskitis/osteomyelitis.  Original Report Authenticated By: Andreas Newport, M.D.   Ct Abdomen Pelvis W Contrast  11/10/2011  *RADIOLOGY REPORT*  Clinical Data: Streptococcus bovis bacteremia. Possible  colonic neoplasm  CT ABDOMEN AND PELVIS WITH CONTRAST  Technique:  Multidetector CT imaging of the abdomen and pelvis was performed following the standard protocol during bolus administration of intravenous contrast.  Contrast: OMNIPAQUE IOHEXOL 300 MG/ML  SOLN  Comparison: None.  Findings: Lung bases:  The right atrium is enlarged.  There is some calcification associated with the mitral valve.  There is left ventricular wall thickening, and thickening of the interventricular septum.  There is linear atelectasis in the left lower lobe. Otherwise lung bases are clear.  The liver, gallbladder, spleen, and pancreas are within normal limits.  1.3 cm right adrenal gland nodule and 1.2 cm left adrenal gland nodule are stable compared to the CT of January 2012.  There are bilateral extrarenal pelves.  No definite hydronephrosis. No evidence of renal mass.  Slight thinning of the renal cortices bilaterally.  The ureters are normal in caliber.  Urinary bladder appears normal.  Abdominal aorta is normal in caliber contains scattered atherosclerotic changes.  Iliac vasculature is normal in caliber.  Stomach is not very distended at the time imaging grossly unremarkable.  There is a diverticulum associated with the third portion of the duodenum.  Small bowel loops are normal in caliber and wall thickness.  Terminal ileum is normal.  The proximal colon is well opacified with the oral contrast. Extending into the lumen of the cecum is an oval soft tissue mass that measures approximately 4.2 x 3.6 x 2.8 cm, associated with the medial wall of the cecum.  This is suspicious for a colonic neoplasm.  No additional colonic mass is suspected.  There are a few scattered diverticula.  No abdominal or pelvic mesenteric lymphadenopathy is identified.  No retroperitoneal lymphadenopathy. 6 mm interaortocaval lymph node on image number 39 is stable.  Prostate gland appears mildly enlarged.  Urinary bladder appears within normal limits   There is a left inguinal hernia.  A single loop of normal-appearing small bowel  is at the level of the mouth of the hernia.  There is a smaller right inguinal hernia, containing fat only.  There are facet joint degenerative changes of the lumbar spine, most prominent at L4-5.  This is likely the cause of approximately 4 mm anterolisthesis of L4 on L5.  IMPRESSION:  1. 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 2. No evidence of lymphadenopathy or metastatic disease. 3. Bilateral inguinal hernias.  The left inguinal hernia contains fat as well as a small portion of a single loop of small bowel near the mouth of the hernia.  The right inguinal hernia contains fat only. 4.  Visualized portion of the heart demonstrates cardiomegaly with left ventricular wall thickening, intraventricular septal thickening, left atrial prominence and some calcification in the vicinity of the mitral valve. 5.  Bilateral stable adrenal gland nodules.  These results will be called to the ordering clinician or representative by the Radiologist Assistant, and communication documented in the PACS Dashboard.  Original Report Authenticated By: Britta Mccreedy, M.D.   Ct Maxillofacial W/cm  11/09/2011  *RADIOLOGY REPORT*  Clinical Data: The recurrent bacteremia.  Oral organisms.  CT MAXILLOFACIAL WITH CONTRAST  Technique:  Multidetector CT imaging of the maxillofacial structures was performed with intravenous contrast. Multiplanar CT image reconstructions were also generated.  Contrast: 80mL OMNIPAQUE IOHEXOL 300 MG/ML  SOLN  Comparison: Panorex from the same day.  CT of the face with contrast 07/01/2010.  Findings: Minimal lucency at the base of the root canal of tooth number 19 is stable to slightly less conspicuous than on the prior exam.  No other significant periapical disease is evident.  The teeth are somewhat obscured by metal artifact.  Limited imaging of the brain is unremarkable.  The globes and  orbits are intact.  No focal mucosal or submucosal lesion is present to the level of the supraglottic larynx.  The salivary glands are symmetric in size and density.  No focal lesions are present within the oral cavity. No significant adenopathy is present.  Degenerative changes in the upper cervical spine are again noted with central and bilateral foraminal narrowing at C3-4, left greater than right.  Degenerative anterolisthesis is present at C4- 5.  There is chronic disc disease at C6-7.  IMPRESSION:  1.  Stable to slight decreased conspicuity focal lucency about the root canal at tooth number 19.  This is likely the sequelae of previous infection or the procedure. 2.  No other significant periapical disease. 3.  No focal abscess or oral cavity lesion to explain the patient's recurrent bacteremia. 4.  Spondylosis of the upper cervical spine.  Original Report Authenticated By: Jamesetta Orleans. MATTERN, M.D.   Dg Chest Port 1 View  11/10/2011  *RADIOLOGY REPORT*  Clinical Data: Line placement.  PORTABLE CHEST - 1 VIEW  Comparison: 11/06/2011  Findings: Right PICC line is in place with the tip in the lower SVC.  Heart is borderline in size.  Lungs are clear.  No effusions or acute bony abnormality.  IMPRESSION: Right PICC line tip in the lower SVC.  Original Report Authenticated By: Cyndie Chime, M.D.    Microbiology: Recent Results (from the past 240 hour(s))  CULTURE, BLOOD (SINGLE)     Status: Normal   Collection Time   11/06/11 12:46 PM      Component Value Range Status Comment   Culture STREPTOCOCCUS GROUP D;high probability for S.bovis   Final    Organism ID, Bacteria STREPTOCOCCUS GROUP  D;high probability for S.bovis   Final   CULTURE, BLOOD (SINGLE)     Status: Normal   Collection Time   11/06/11 12:51 PM      Component Value Range Status Comment   Organism ID, Bacteria STREPTOCOCCUS GROUP D;high probability for S.bovis   Final   CULTURE, BLOOD (ROUTINE X 2)     Status: Normal (Preliminary  result)   Collection Time   11/07/11 12:20 AM      Component Value Range Status Comment   Specimen Description BLOOD LEFT ARM   Final    Special Requests BOTTLES DRAWN AEROBIC AND ANAEROBIC 10CC   Final    Culture  Setup Time 11/07/2011 09:06   Final    Culture     Final    Value:        BLOOD CULTURE RECEIVED NO GROWTH TO DATE CULTURE WILL BE HELD FOR 5 DAYS BEFORE ISSUING A FINAL NEGATIVE REPORT   Report Status PENDING   Incomplete   CULTURE, BLOOD (ROUTINE X 2)     Status: Normal (Preliminary result)   Collection Time   11/07/11 12:24 AM      Component Value Range Status Comment   Specimen Description BLOOD LEFT WRIST   Final    Special Requests BOTTLES DRAWN AEROBIC ONLY 10CC   Final    Culture  Setup Time 11/07/2011 09:05   Final    Culture     Final    Value:        BLOOD CULTURE RECEIVED NO GROWTH TO DATE CULTURE WILL BE HELD FOR 5 DAYS BEFORE ISSUING A FINAL NEGATIVE REPORT   Report Status PENDING   Incomplete   CULTURE, BLOOD (ROUTINE X 2)     Status: Normal (Preliminary result)   Collection Time   11/09/11  2:24 PM      Component Value Range Status Comment   Specimen Description BLOOD RIGHT ARM   Final    Special Requests BOTTLES DRAWN AEROBIC AND ANAEROBIC 10CC   Final    Culture  Setup Time 11/09/2011 22:00   Final    Culture     Final    Value:        BLOOD CULTURE RECEIVED NO GROWTH TO DATE CULTURE WILL BE HELD FOR 5 DAYS BEFORE ISSUING A FINAL NEGATIVE REPORT   Report Status PENDING   Incomplete   CULTURE, BLOOD (ROUTINE X 2)     Status: Normal (Preliminary result)   Collection Time   11/09/11  2:28 PM      Component Value Range Status Comment   Specimen Description BLOOD LEFT ARM   Final    Special Requests BOTTLES DRAWN AEROBIC AND ANAEROBIC 10CC   Final    Culture  Setup Time 11/09/2011 22:00   Final    Culture     Final    Value:        BLOOD CULTURE RECEIVED NO GROWTH TO DATE CULTURE WILL BE HELD FOR 5 DAYS BEFORE ISSUING A FINAL NEGATIVE REPORT   Report Status  PENDING   Incomplete      Labs: Basic Metabolic Panel:  Lab 11/11/11 1610 11/10/11 0515 11/09/11 0620 11/08/11 0500 11/07/11 0650  NA 139 137 136 136 133*  K 3.9 3.8 3.9 4.1 3.7  CL 101 100 100 100 97  CO2 27 30 24 26 24   GLUCOSE 110* 105* 106* 106* 128*  BUN 11 15 14 16 16   CREATININE 1.04 1.06 0.97 1.13 1.06  CALCIUM 8.9 8.7  8.6 8.6 8.5  MG -- -- -- -- --  PHOS -- -- -- -- --   Liver Function Tests:  Lab 11/07/11 0650  AST 20  ALT 11  ALKPHOS 66  BILITOT 0.5  PROT 6.5  ALBUMIN 3.3*   No results found for this basename: LIPASE:5,AMYLASE:5 in the last 168 hours No results found for this basename: AMMONIA:5 in the last 168 hours CBC:  Lab 11/11/11 0500 11/09/11 0620 11/08/11 0500 11/07/11 0650 11/06/11 1712 11/06/11 1246  WBC 6.3 5.9 6.6 9.0 9.6 --  NEUTROABS -- -- -- -- -- 8.2*  HGB 10.2* 9.9* 9.9* 10.5* 10.4* --  HCT 30.0* 29.2* 29.5* 30.6* 30.5* --  MCV 88.8 88.2 89.1 89.7 89.2 --  PLT 236 187 160 154 138* --   Cardiac Enzymes: No results found for this basename: CKTOTAL:5,CKMB:5,CKMBINDEX:5,TROPONINI:5 in the last 168 hours BNP: BNP (last 3 results) No results found for this basename: PROBNP:3 in the last 8760 hours CBG: No results found for this basename: GLUCAP:5 in the last 168 hours  Time coordinating discharge: 40 minutes  Signed:  Saryn Cherry A  [facility] 11/12/2011, 12:59 PM

## 2011-11-12 NOTE — Progress Notes (Signed)
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 Penicillins Rash    Patient Measurements: Height: 6' (182.9 cm) Weight: 186 lb 12.8 oz (84.732 kg) IBW/kg (Calculated) : 77.6  Adjusted Body Weight:   Vital Signs: Temp: 98.4 F (36.9 C) (08/08 0532) Temp src: Oral (08/08 0532) BP: 93/55 mmHg (08/08 0532) Pulse Rate: 89  (08/08 0532) Intake/Output from previous day: 08/07 0701 - 08/08 0700 In: 220 [P.O.:220] Out: -  Intake/Output from this shift: Total I/O In: 240 [P.O.:240] Out: -   Labs:  Basename 11/11/11 0500 11/10/11 0515  WBC 6.3 --  HGB 10.2* --  PLT 236 --  LABCREA -- --  CREATININE 1.04 1.06   Estimated Creatinine Clearance: 73.6 ml/min (by C-G formula based on Cr of 1.04). No results found for this basename: VANCOTROUGH:2,VANCOPEAK:2,VANCORANDOM:2,GENTTROUGH:2,GENTPEAK:2,GENTRANDOM:2,TOBRATROUGH:2,TOBRAPEAK:2,TOBRARND:2,AMIKACINPEAK:2,AMIKACINTROU:2,AMIKACIN:2, in the last 72 hours   Microbiology: Recent Results (from the past 720 hour(s))  CULTURE, BLOOD (SINGLE)     Status: Normal   Collection Time   11/06/11 12:46 PM      Component Value Range Status Comment   Culture STREPTOCOCCUS GROUP D;high probability for S.bovis   Final    Organism ID, Bacteria STREPTOCOCCUS GROUP D;high probability for S.bovis   Final   CULTURE, BLOOD (SINGLE)     Status: Normal   Collection Time   11/06/11 12:51 PM      Component Value Range Status Comment   Organism ID, Bacteria STREPTOCOCCUS GROUP D;high probability for S.bovis   Final   CULTURE, BLOOD (ROUTINE X 2)     Status: Normal (Preliminary result)   Collection Time   11/07/11 12:20 AM      Component Value Range Status Comment   Specimen Description BLOOD LEFT ARM   Final    Special Requests BOTTLES DRAWN AEROBIC AND ANAEROBIC 10CC   Final    Culture  Setup Time 11/07/2011 09:06   Final    Culture     Final    Value:        BLOOD CULTURE RECEIVED NO GROWTH TO DATE CULTURE WILL BE HELD FOR 5 DAYS BEFORE ISSUING A FINAL NEGATIVE REPORT   Report Status PENDING   Incomplete   CULTURE, BLOOD (ROUTINE X 2)     Status: Normal (Preliminary result)   Collection Time   11/07/11 12:24 AM      Component Value Range Status Comment   Specimen Description BLOOD LEFT WRIST   Final    Special Requests BOTTLES DRAWN AEROBIC ONLY 10CC   Final    Culture  Setup Time 11/07/2011 09:05   Final    Culture     Final    Value:        BLOOD CULTURE RECEIVED NO GROWTH TO DATE CULTURE WILL BE HELD FOR 5 DAYS BEFORE ISSUING A FINAL NEGATIVE REPORT   Report Status PENDING   Incomplete   CULTURE, BLOOD (ROUTINE X 2)     Status: Normal (Preliminary result)   Collection Time   11/09/11  2:24 PM      Component Value Range Status Comment   Specimen Description BLOOD RIGHT ARM   Final    Special Requests BOTTLES DRAWN AEROBIC AND ANAEROBIC 10CC   Final    Culture  Setup Time 11/09/2011 22:00   Final    Culture     Final    Value:        BLOOD CULTURE RECEIVED  NO GROWTH TO DATE CULTURE WILL BE HELD FOR 5 DAYS BEFORE ISSUING A FINAL NEGATIVE REPORT   Report Status PENDING   Incomplete   CULTURE, BLOOD (ROUTINE X 2)     Status: Normal (Preliminary result)   Collection Time   11/09/11  2:28 PM      Component Value Range Status Comment   Specimen Description BLOOD LEFT ARM   Final    Special Requests BOTTLES DRAWN AEROBIC AND ANAEROBIC 10CC   Final    Culture  Setup Time 11/09/2011 22:00   Final    Culture     Final    Value:        BLOOD CULTURE RECEIVED NO GROWTH TO DATE CULTURE WILL BE HELD FOR 5 DAYS BEFORE ISSUING A FINAL NEGATIVE REPORT   Report Status PENDING   Incomplete     Medical History: Past Medical History  Diagnosis Date  . Heart murmur   . Coronary artery disease   . Endocarditis   . Arthritis   . Hypertension     Medications:   Assessment: 71yom to have Gentamicin added to Ceftriaxone  for Streptococcus Group D (most likely S. 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 Patient is currently afebrile, WBC wnl, and repeat blood cultures (8/3 and 8/5) are NGTD. Patient is scheduled for discharge today and MD is requesting once daily dosing of Gentamicin (synergy) - acceptable for this indication. - SCr 1.04, Crcl 74 ml/min - Wt: 84kg  Goal: Gentamicin trough <0.1 mg/dl  Plan:  1. Gentamicin 250mg  (~3mg /kg) IV q24h 2. Continue Ceftriaxone 2g IV daily - dose appropriate for endocarditis treatment 3. Renal function and gentamicin trough should be measured at least weekly. Avoid other nephrotoxic drugs if possible 4. Follow-up ID recommendations for length of therapy for each agent  [name] [phone] 11/12/2011,12:23 PM

## 2011-11-12 NOTE — Progress Notes (Signed)
[facility]    Date of Admission:  11/06/2011   Total days of antibiotics 7        Rocephin Day 7                 Active Problems:  Acute bacterial endocarditis  HTN (hypertension)  BPH (benign prostatic hyperplasia)  Subaortic stenosis  Back pain  Hyperlipidemia  Colonic mass      . cefTRIAXone (ROCEPHIN)  IV  2 g Intravenous Daily  . cholecalciferol  2,000 Units Oral Daily  . docusate sodium  100 mg Oral BID  . metoprolol succinate  25 mg Oral Daily  . multivitamin with minerals  1 tablet Oral Daily  . pantoprazole  40 mg Oral Q0600  . simvastatin  20 mg Oral q1800  . sodium chloride  1 spray Each Nare QID  . sodium chloride  10 mL Intravenous Q12H  . vitamin C  500 mg Oral BID  . vitamin E  400 Units Oral BID  . DISCONTD: cefTRIAXone (ROCEPHIN)  IV  2 g Intravenous Q12H    Subjective: Pt was resting comfortably and had no complaints or overnight events. Denies chest pain, SOB, N/V, fevers or chills.  Objective: Temp:  [97.9 F (36.6 C)-98.7 F (37.1 C)] 98.4 F (36.9 C) (08/08 0532) Pulse Rate:  [89-98] 89  (08/08 0532) Resp:  [8-23] 16  (08/08 0532) BP: (93-131)/(55-74) 93/55 mmHg (08/08 0532) SpO2:  [95 %-99 %] 95 % (08/08 0532)  General: resting in chair beside bed  HEENT: PERRL, EOMI, no scleral icterus, poor dentition  Cardiac: systolic murmurs, irregular rhythm, tachycardia  Pulm: clear to auscultation bilaterally, moving normal volumes of air  Abd: soft, nontender, nondistended, BS present  Ext: warm and well perfused, no pedal edema  Neuro: alert and oriented X3, cranial nerves II-XII grossly intact   Lab Results Lab Results  Component Value Date   WBC 6.3 11/11/2011   HGB 10.2* 11/11/2011   HCT 30.0* 11/11/2011   MCV 88.8 11/11/2011   PLT 236 11/11/2011    Lab Results  Component Value Date   CREATININE 1.04 11/11/2011   BUN 11 11/11/2011   NA 139 11/11/2011   K 3.9 11/11/2011   CL 101 11/11/2011   CO2 27 11/11/2011    Lab  Results  Component Value Date   ALT 11 11/07/2011   AST 20 11/07/2011   ALKPHOS 66 11/07/2011   BILITOT 0.5 11/07/2011      Microbiology: Recent Results (from the past 240 hour(s))  CULTURE, BLOOD (SINGLE)     Status: Normal   Collection Time   11/06/11 12:46 PM      Component Value Range Status Comment   Culture STREPTOCOCCUS GROUP D;high probability for S.bovis   Final    Organism ID, Bacteria STREPTOCOCCUS GROUP D;high probability for S.bovis   Final   CULTURE, BLOOD (SINGLE)     Status: Normal   Collection Time   11/06/11 12:51 PM      Component Value Range Status Comment   Organism ID, Bacteria STREPTOCOCCUS GROUP D;high probability for S.bovis   Final   CULTURE, BLOOD (ROUTINE X 2)     Status: Normal (Preliminary result)   Collection Time   11/07/11 12:20 AM      Component Value Range Status Comment   Specimen Description BLOOD LEFT ARM   Final    Special Requests BOTTLES DRAWN AEROBIC AND ANAEROBIC 10CC   Final    Culture  Setup Time 11/07/2011 09:06   Final    Culture     Final    Value:        BLOOD CULTURE RECEIVED NO GROWTH TO DATE CULTURE WILL BE HELD FOR 5 DAYS BEFORE ISSUING A FINAL NEGATIVE REPORT   Report Status PENDING   Incomplete   CULTURE, BLOOD (ROUTINE X 2)     Status: Normal (Preliminary result)   Collection Time   11/07/11 12:24 AM      Component Value Range Status Comment   Specimen Description BLOOD LEFT WRIST   Final    Special Requests BOTTLES DRAWN AEROBIC ONLY 10CC   Final    Culture  Setup Time 11/07/2011 09:05   Final    Culture     Final    Value:        BLOOD CULTURE RECEIVED NO GROWTH TO DATE CULTURE WILL BE HELD FOR 5 DAYS BEFORE ISSUING A FINAL NEGATIVE REPORT   Report Status PENDING   Incomplete   CULTURE, BLOOD (ROUTINE X 2)     Status: Normal (Preliminary result)   Collection Time   11/09/11  2:24 PM      Component Value Range Status Comment   Specimen Description BLOOD RIGHT ARM   Final    Special Requests BOTTLES DRAWN AEROBIC AND ANAEROBIC 10CC    Final    Culture  Setup Time 11/09/2011 22:00   Final    Culture     Final    Value:        BLOOD CULTURE RECEIVED NO GROWTH TO DATE CULTURE WILL BE HELD FOR 5 DAYS BEFORE ISSUING A FINAL NEGATIVE REPORT   Report Status PENDING   Incomplete   CULTURE, BLOOD (ROUTINE X 2)     Status: Normal (Preliminary result)   Collection Time   11/09/11  2:28 PM      Component Value Range Status Comment   Specimen Description BLOOD LEFT ARM   Final    Special Requests BOTTLES DRAWN AEROBIC AND ANAEROBIC 10CC   Final    Culture  Setup Time 11/09/2011 22:00   Final    Culture     Final    Value:        BLOOD CULTURE RECEIVED NO GROWTH TO DATE CULTURE WILL BE HELD FOR 5 DAYS BEFORE ISSUING A FINAL NEGATIVE REPORT   Report Status PENDING   Incomplete     Studies/Results: Ct Abdomen Pelvis W Contrast  11/10/2011  *RADIOLOGY REPORT*  Clinical Data: Streptococcus bovis bacteremia. Possible colonic neoplasm  CT ABDOMEN AND PELVIS WITH CONTRAST  Technique:  Multidetector CT imaging of the abdomen and pelvis was performed following the standard protocol during bolus administration of intravenous contrast.  Contrast: OMNIPAQUE IOHEXOL 300 MG/ML  SOLN  Comparison: None.  Findings: Lung bases:  The right atrium is enlarged.  There is some calcification associated with the mitral valve.  There is left ventricular wall thickening, and thickening of the interventricular septum.  There is linear atelectasis in the left lower lobe. Otherwise lung bases are clear.  The liver, gallbladder, spleen, and pancreas are within normal limits.  1.3 cm right adrenal gland nodule and 1.2 cm left adrenal gland nodule are stable compared to the CT of January 2012.  There are bilateral extrarenal pelves.  No definite hydronephrosis. No evidence of renal mass.  Slight thinning of the renal cortices bilaterally.  The ureters are normal in caliber.  Urinary bladder appears normal.  Abdominal aorta is normal in  caliber contains scattered  atherosclerotic changes.  Iliac vasculature is normal in caliber.  Stomach is not very distended at the time imaging grossly unremarkable.  There is a diverticulum associated with the third portion of the duodenum.  Small bowel loops are normal in caliber and wall thickness.  Terminal ileum is normal.  The proximal colon is well opacified with the oral contrast. Extending into the lumen of the cecum is an oval soft tissue mass that measures approximately 4.2 x 3.6 x 2.8 cm, associated with the medial wall of the cecum.  This is suspicious for a colonic neoplasm.  No additional colonic mass is suspected.  There are a few scattered diverticula.  No abdominal or pelvic mesenteric lymphadenopathy is identified.  No retroperitoneal lymphadenopathy. 6 mm interaortocaval lymph node on image number 39 is stable.  Prostate gland appears mildly enlarged.  Urinary bladder appears within normal limits  There is a left inguinal hernia.  A single loop of normal-appearing small bowel is at the level of the mouth of the hernia.  There is a smaller right inguinal hernia, containing fat only.  There are facet joint degenerative changes of the lumbar spine, most prominent at L4-5.  This is likely the cause of approximately 4 mm anterolisthesis of L4 on L5.  IMPRESSION:  1. 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 2. No evidence of lymphadenopathy or metastatic disease. 3. Bilateral inguinal hernias.  The left inguinal hernia contains fat as well as a small portion of a single loop of small bowel near the mouth of the hernia.  The right inguinal hernia contains fat only. 4.  Visualized portion of the heart demonstrates cardiomegaly with left ventricular wall thickening, intraventricular septal thickening, left atrial prominence and some calcification in the vicinity of the mitral valve. 5.  Bilateral stable adrenal gland nodules.  These results will be called to the ordering clinician or  representative by the Radiologist Assistant, and communication documented in the PACS Dashboard.  Original Report Authenticated By: Britta Mccreedy, M.D.   Dg Chest Port 1 View  11/10/2011  *RADIOLOGY REPORT*  Clinical Data: Line placement.  PORTABLE CHEST - 1 VIEW  Comparison: 11/06/2011  Findings: Right PICC line is in place with the tip in the lower SVC.  Heart is borderline in size.  Lungs are clear.  No effusions or acute bony abnormality.  IMPRESSION: Right PICC line tip in the lower SVC.  Original Report Authenticated By: Cyndie Chime, M.D.    ID Assessment: 71 yo male with a PMH of CAD, subvalvular aortic stenosis, 2 episodes of native valve endocarditis (S. Salivarius and S. Viridans), presented with fevers for the past 2 weeks of up to 101.4 F, chills occasional hotflash, and low back pain. Pt has Strep Bovis Bacteremia during this visit. After CT Maxillofacial with contrast did not reveal a source of bacteria, CT abdomin and pelvis was done which showed an intraluminal cecal mass, likely colon cancer. This is likely the source of the bacteria. Colonoscopy completed, biopsy taken, and resection will occur once bacterial endocarditis has been treated.  Pt remains afebrile with WBC within normal limits and hypotension.   ID Plan: 1. Continue Rocephin IV 2g daily and complete 6 week course 2. added Gentamycin as Strep. Group D MIC was .12. ID clinic requests CBC/BMP to be faxed to the clinic twice weekly 3. F/U with ID clinic in 2 weeks to see if pt is tolerating the course of medication  4. Per Dr. Betti Cruz, pt will be discharged today  Sherryll Burger [facility] 11/12/2011, 8:50 AM  This is a Psychologist, occupational Note. The care of the patient was discussed with Dr. Daiva Eves and the assessment and plan was formulated with their assistance. Please see their note for official documentation of the patient encounter.

## 2011-11-12 NOTE — Progress Notes (Signed)
Subjective: No specific complaints.  Denies any pain.  Objective: Vital signs in last 24 hours: Filed Vitals:   11/11/11 1630 11/11/11 1640 11/11/11 2021 11/12/11 0532  BP: 106/72 122/60 105/63 93/55  Pulse:   96 89  Temp:   97.9 F (36.6 C) 98.4 F (36.9 C)  TempSrc:   Oral Oral  Resp: 17 16 16 16   Height:      Weight:      SpO2: 99% 98% 96% 95%   Weight change:   Intake/Output Summary (Last 24 hours) at 11/12/11 1250 Last data filed at 11/12/11 0800  Gross per 24 hour  Intake    460 ml  Output      0 ml  Net    460 ml    Physical Exam: General: Awake, Oriented, No acute distress. HEENT: EOMI. Neck: Supple CV: Grade 3/6 systolic murmur, S1 and S2 Lungs: Clear to ascultation bilaterally Abdomen: Soft, Nontender, Nondistended, +bowel sounds. Ext: Good pulses. Trace edema.  Lab Results: Basic Metabolic Panel:  Lab 11/11/11 1191 11/10/11 0515 11/09/11 0620 11/08/11 0500 11/07/11 0650  NA 139 137 136 136 133*  K 3.9 3.8 3.9 4.1 3.7  CL 101 100 100 100 97  CO2 27 30 24 26 24   GLUCOSE 110* 105* 106* 106* 128*  BUN 11 15 14 16 16   CREATININE 1.04 1.06 0.97 1.13 1.06  CALCIUM 8.9 8.7 8.6 8.6 8.5  MG -- -- -- -- --  PHOS -- -- -- -- --   Liver Function Tests:  Lab 11/07/11 0650  AST 20  ALT 11  ALKPHOS 66  BILITOT 0.5  PROT 6.5  ALBUMIN 3.3*   No results found for this basename: LIPASE:5,AMYLASE:5 in the last 168 hours No results found for this basename: AMMONIA:5 in the last 168 hours CBC:  Lab 11/11/11 0500 11/09/11 0620 11/08/11 0500 11/07/11 0650 11/06/11 1712 11/06/11 1246  WBC 6.3 5.9 6.6 9.0 9.6 --  NEUTROABS -- -- -- -- -- 8.2*  HGB 10.2* 9.9* 9.9* 10.5* 10.4* --  HCT 30.0* 29.2* 29.5* 30.6* 30.5* --  MCV 88.8 88.2 89.1 89.7 89.2 --  PLT 236 187 160 154 138* --   Cardiac Enzymes: No results found for this basename: CKTOTAL:5,CKMB:5,CKMBINDEX:5,TROPONINI:5 in the last 168 hours BNP (last 3 results) No results found for this basename: PROBNP:3  in the last 8760 hours CBG: No results found for this basename: GLUCAP:5 in the last 168 hours No results found for this basename: HGBA1C:5 in the last 72 hours Other Labs: No components found with this basename: POCBNP:3 No results found for this basename: DDIMER:2 in the last 168 hours No results found for this basename: CHOL:2,HDL:2,LDLCALC:2,TRIG:2,CHOLHDL:2,LDLDIRECT:2 in the last 168 hours No results found for this basename: TSH,T4TOTAL,FREET3,T3FREE,FREET4,THYROIDAB in the last 168 hours No results found for this basename: VITAMINB12:2,FOLATE:2,FERRITIN:2,TIBC:2,IRON:2,RETICCTPCT:2 in the last 168 hours  Micro Results: Recent Results (from the past 240 hour(s))  CULTURE, BLOOD (SINGLE)     Status: Normal   Collection Time   11/06/11 12:46 PM      Component Value Range Status Comment   Culture STREPTOCOCCUS GROUP D;high probability for S.bovis   Final    Organism ID, Bacteria STREPTOCOCCUS GROUP D;high probability for S.bovis   Final   CULTURE, BLOOD (SINGLE)     Status: Normal   Collection Time   11/06/11 12:51 PM      Component Value Range Status Comment   Organism ID, Bacteria STREPTOCOCCUS GROUP D;high probability for S.bovis   Final  CULTURE, BLOOD (ROUTINE X 2)     Status: Normal (Preliminary result)   Collection Time   11/07/11 12:20 AM      Component Value Range Status Comment   Specimen Description BLOOD LEFT ARM   Final    Special Requests BOTTLES DRAWN AEROBIC AND ANAEROBIC 10CC   Final    Culture  Setup Time 11/07/2011 09:06   Final    Culture     Final    Value:        BLOOD CULTURE RECEIVED NO GROWTH TO DATE CULTURE WILL BE HELD FOR 5 DAYS BEFORE ISSUING A FINAL NEGATIVE REPORT   Report Status PENDING   Incomplete   CULTURE, BLOOD (ROUTINE X 2)     Status: Normal (Preliminary result)   Collection Time   11/07/11 12:24 AM      Component Value Range Status Comment   Specimen Description BLOOD LEFT WRIST   Final    Special Requests BOTTLES DRAWN AEROBIC ONLY 10CC    Final    Culture  Setup Time 11/07/2011 09:05   Final    Culture     Final    Value:        BLOOD CULTURE RECEIVED NO GROWTH TO DATE CULTURE WILL BE HELD FOR 5 DAYS BEFORE ISSUING A FINAL NEGATIVE REPORT   Report Status PENDING   Incomplete   CULTURE, BLOOD (ROUTINE X 2)     Status: Normal (Preliminary result)   Collection Time   11/09/11  2:24 PM      Component Value Range Status Comment   Specimen Description BLOOD RIGHT ARM   Final    Special Requests BOTTLES DRAWN AEROBIC AND ANAEROBIC 10CC   Final    Culture  Setup Time 11/09/2011 22:00   Final    Culture     Final    Value:        BLOOD CULTURE RECEIVED NO GROWTH TO DATE CULTURE WILL BE HELD FOR 5 DAYS BEFORE ISSUING A FINAL NEGATIVE REPORT   Report Status PENDING   Incomplete   CULTURE, BLOOD (ROUTINE X 2)     Status: Normal (Preliminary result)   Collection Time   11/09/11  2:28 PM      Component Value Range Status Comment   Specimen Description BLOOD LEFT ARM   Final    Special Requests BOTTLES DRAWN AEROBIC AND ANAEROBIC 10CC   Final    Culture  Setup Time 11/09/2011 22:00   Final    Culture     Final    Value:        BLOOD CULTURE RECEIVED NO GROWTH TO DATE CULTURE WILL BE HELD FOR 5 DAYS BEFORE ISSUING A FINAL NEGATIVE REPORT   Report Status PENDING   Incomplete     Studies/Results: No results found.  Medications: I have reviewed the patient's current medications. Scheduled Meds:    . cefTRIAXone (ROCEPHIN)  IV  2 g Intravenous Daily  . cholecalciferol  2,000 Units Oral Daily  . docusate sodium  100 mg Oral BID  . gentamicin  250 mg Intravenous Q24H  . metoprolol succinate  25 mg Oral Daily  . multivitamin with minerals  1 tablet Oral Daily  . pantoprazole  40 mg Oral Q0600  . simvastatin  20 mg Oral q1800  . sodium chloride  1 spray Each Nare QID  . sodium chloride  10 mL Intravenous Q12H  . vitamin C  500 mg Oral BID  . vitamin E  400 Units Oral  BID  . DISCONTD: cefTRIAXone (ROCEPHIN)  IV  2 g Intravenous Q12H     Continuous Infusions:  PRN Meds:.acetaminophen, acetaminophen, morphine injection, ondansetron (ZOFRAN) IV, ondansetron, oxyCODONE, sodium chloride, DISCONTD: fentaNYL, DISCONTD: midazolam  Assessment/Plan: Acute bacterial endocarditis Blood cultures from 11/06/2011 grew Group D streptococcus (high probability Streptococcus bovis) in 2/2 bottles, subsequent surveillance blood cultures on 11/07/2011 in 11/09/2011 show no growth to date. TEE was done on 11/06/11, by Dr Yates Decamp, cardiologist, and showed no vegetations. The right ventricular systolic pressure was increased consistent with mild pulmonary hypertension. Remained hemodynamically stable and afebrile. Will need 4 week course of ceftriaxone, antibiotic since 11/06/2011.  Based on his penicillin MIC 0.12, ID started him on gentamicin on 11/12/2011 which she will need for 2 weeks to PICC line has been placed on 11/09/11. Abdominal/pelvic CT scan of 11/10/11, showed intraluminal cecal mass (4.2 x 3.6 x 2.8 cm) is suspicious for colonic neoplasm, but no evidence of lymphadenopathy or metastatic disease. Dr. 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  Discussed with Dr. [name], general surgery, who will arrange for outpatient followup with general surgery and outpatient surgery in 3-4 weeks before completing course of antibiotics.  Colonic mass Outpatient surgery as indicated above.   Back pain Stable. 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   HTN (hypertension) BP appears controlled, stable.  BPH (benign prostatic hyperplasia) Stable.  Subaortic stenosis Stable.  Dyslipidemia Statin.   Epistaxis Was brief, on 11/07/11.  Resolved.  Prophylaxis SCDs.  Disposition Discharge the patient with home health antibiotics and RN..   LOS: 6  days  Constantina Laseter A, MD 11/12/2011, 12:50 PM

## 2011-11-12 NOTE — Progress Notes (Signed)
Internal Medicine Teaching Service Attending Note Date: 11/12/2011  Patient name: IRBIN FINES  Medical record number: [id number]  Date of birth: 17-Feb-1941    This patient has been seen and discussed with the house staff. Please see their note for complete details. I concur with their findings with the following additions/corrections:  Microbiology lab had told me that the patients MIC is =.12 which is Greater than 0.1 placing pt in range where proper therapy for endocarditis with Streptococcus Bovis would be PCN/Rocephin x 4 weeks and gentamicin  I actually triple checked with micro based on epic note and the MIC is in fact <= .12 by E test so it could in fact be <0.1 which would mean no need for gentamicin or in range where gent would be needed by guidelines. They are going to check futher and call me back and I will call [facility] in [place]. I would LOVE to stop the gentamicin and avoid nephrotoxicity risk here.  I spent greater than 45 minutes with the patient including greater than 50% of time in face to face counsel of the patient and in coordination of their care.     Acey Lav 11/12/2011, 3:56 PM

## 2011-11-13 ENCOUNTER — Telehealth: Payer: Self-pay | Admitting: Infectious Disease

## 2011-11-13 DIAGNOSIS — I2510 Atherosclerotic heart disease of native coronary artery without angina pectoris: Secondary | ICD-10-CM | POA: Diagnosis not present

## 2011-11-13 DIAGNOSIS — I330 Acute and subacute infective endocarditis: Secondary | ICD-10-CM | POA: Diagnosis not present

## 2011-11-13 DIAGNOSIS — K639 Disease of intestine, unspecified: Secondary | ICD-10-CM | POA: Diagnosis not present

## 2011-11-13 DIAGNOSIS — Z5181 Encounter for therapeutic drug level monitoring: Secondary | ICD-10-CM | POA: Diagnosis not present

## 2011-11-13 DIAGNOSIS — Z452 Encounter for adjustment and management of vascular access device: Secondary | ICD-10-CM | POA: Diagnosis not present

## 2011-11-13 LAB — CULTURE, BLOOD (ROUTINE X 2): Culture: NO GROWTH

## 2011-11-13 NOTE — Telephone Encounter (Signed)
Patient had PCN MIC at [facility]:  =.12  I called [facility] and they got an MIC  Of  =  0.06  I am asking [facility] to re-run the MIC on [facility] for the Strep bois and I am also having [facility] re-run the test  Once we have the repeat results back we can decide on whether to add back gentaimcin for   MIC> .1  Vs not have to add it if <.1

## 2011-11-13 NOTE — Telephone Encounter (Signed)
[name] I want to make sure Mr Hauschild has appt with [place] in the next 2 weeks

## 2011-11-15 LAB — CULTURE, BLOOD (ROUTINE X 2): Culture: NO GROWTH

## 2011-11-16 ENCOUNTER — Telehealth: Payer: Self-pay | Admitting: Infectious Disease

## 2011-11-16 DIAGNOSIS — Z452 Encounter for adjustment and management of vascular access device: Secondary | ICD-10-CM | POA: Diagnosis not present

## 2011-11-16 DIAGNOSIS — I2510 Atherosclerotic heart disease of native coronary artery without angina pectoris: Secondary | ICD-10-CM | POA: Diagnosis not present

## 2011-11-16 DIAGNOSIS — I330 Acute and subacute infective endocarditis: Secondary | ICD-10-CM | POA: Diagnosis not present

## 2011-11-16 DIAGNOSIS — Z5181 Encounter for therapeutic drug level monitoring: Secondary | ICD-10-CM | POA: Diagnosis not present

## 2011-11-16 DIAGNOSIS — K639 Disease of intestine, unspecified: Secondary | ICD-10-CM | POA: Diagnosis not present

## 2011-11-16 LAB — CULTURE, BLOOD (SINGLE)

## 2011-11-16 NOTE — Telephone Encounter (Signed)
Received call from [facility] and repeat MIC for PCN at [facility] is 0.94. [facility] was .06   Therefore will continue with only rocephin for course for endocarditis.

## 2011-11-23 DIAGNOSIS — Z452 Encounter for adjustment and management of vascular access device: Secondary | ICD-10-CM | POA: Diagnosis not present

## 2011-11-23 DIAGNOSIS — Z7902 Long term (current) use of antithrombotics/antiplatelets: Secondary | ICD-10-CM | POA: Diagnosis not present

## 2011-11-23 DIAGNOSIS — K639 Disease of intestine, unspecified: Secondary | ICD-10-CM | POA: Diagnosis not present

## 2011-11-23 DIAGNOSIS — Z5181 Encounter for therapeutic drug level monitoring: Secondary | ICD-10-CM | POA: Diagnosis not present

## 2011-11-23 DIAGNOSIS — I330 Acute and subacute infective endocarditis: Secondary | ICD-10-CM | POA: Diagnosis not present

## 2011-11-23 DIAGNOSIS — I2510 Atherosclerotic heart disease of native coronary artery without angina pectoris: Secondary | ICD-10-CM | POA: Diagnosis not present

## 2011-11-25 ENCOUNTER — Encounter: Payer: Self-pay | Admitting: Internal Medicine

## 2011-11-25 ENCOUNTER — Telehealth: Payer: Self-pay | Admitting: *Deleted

## 2011-11-25 ENCOUNTER — Ambulatory Visit (INDEPENDENT_AMBULATORY_CARE_PROVIDER_SITE_OTHER): Payer: Medicare Other | Admitting: Internal Medicine

## 2011-11-25 VITALS — BP 103/66 | HR 77 | Temp 97.4°F | Wt 187.0 lb

## 2011-11-25 DIAGNOSIS — A491 Streptococcal infection, unspecified site: Secondary | ICD-10-CM

## 2011-11-25 DIAGNOSIS — I330 Acute and subacute infective endocarditis: Secondary | ICD-10-CM

## 2011-11-25 DIAGNOSIS — K6389 Other specified diseases of intestine: Secondary | ICD-10-CM

## 2011-11-25 NOTE — Progress Notes (Signed)
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   MRN: [id number]  HPI  [name] is a 71 yo Male with hx of CAD s/p PCI and subvalvular aortic stenosis who has previous hx of streptococcal native valve endocarditis x 2 (S.salivarius in Jan 2012, and viridans group Streptococci in March 2012).   He was last seen in ID clinic on 8/2 as a referral for Dr. Yates Decamp, concern for recurrent endocarditis. [name] reported having  fevers, up to 101.89F, intermittently for  2 wks. He denies rigors, chills, or nightsweats, but has occ hotflash. When his symptoms first started it was also accompanied by low back pain. He saw his PCP who empirically treated him with cipro for possibly UTI . On July 31st, He was seen again by PCP who gave doxycycline and did blood cultures that identified GPC on day #2 He was also seen by his cardiologist, Dr. Yates Decamp, who performed a repeat TTE for concern of endocarditis. His TTE on 8/1 revealed EF 60-65%, mild AR, with severe sub-aortic stenosis, mod AV leaflet thickening with mild calcifications. AVleaflet mobility mildly restricted. LVOT obstruction. Also noted mild to mod MR, mild MV leaflet thickening. Mod TR, trace Pulmonic regurgitation. No vegetations visualized on TTE.   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 He had + blood cultures on 7/31 and 8/2 showing strep bovis. Isolates at 2 different labs reporting 2 different PCN MIC. He had documented clearance on 11/07/2011. He underwent TEE which did not show any vegetations. Due to back pain, he underwent back MRI which was negative for diskitis or epidural abscess. Once strep bovis was isolated, an abd/pelvis CT was conducted which showed a 4 x 3.5  cecal mass. He also underwent a colonoscopy and biopsy on 8/7,  path negative for malignancy, thus far.  Interestingly, it appears that micro labs have different PCN MIC reports on the isolates([facility] 0.94 and [facility] 0.06)- please see Dr. Zenaida Niece Dam's last phone note in epic. Nonetheless, ceftriaxone 2gm IV daily would be treatment of choice. No need for addn of gentamicin.  He was discharged on 4 wks of ceftriaxone 2gm IV daily. To follow up with ID and general surgery, Dr. Ezzard Standing.   He presents in clinic on abtx day #20 of 28. He states that he no longer having fevers, he is fatigued. No pain or irritation at picc line site. No diarrhea. No yeast infection.tolerating antibiotics well. Still has occasional back pain. No problems with constiptation Current Outpatient Prescriptions on File Prior to Visit  Medication Sig Dispense Refill  . Ascorbic Acid (VITAMIN C) 500 MG tablet Take 500 mg by mouth 2 (two) times daily.        . cholecalciferol (VITAMIN D) 1000 UNITS tablet Take 2,000 Units by mouth daily.       . Coenzyme Q10 (EQL COQ10) 300 MG CAPS Take 1 capsule by mouth daily.        Marland Kitchen dextrose 5 % SOLN 100 mL with gentamicin 40 MG/ML SOLN 250 mg Inject 250 mg into the vein daily. For 2 weeks from 11/12/2011.  13 Units  0  . dextrose 5 % SOLN 50 mL with cefTRIAXone 2 G SOLR 2 g IV daily for 4 weeks from 11/06/2011.  21 Units  0  . Ginkgo Biloba 60 MG CAPS  Take 1 capsule by mouth daily.        Marland Kitchen glucosamine-chondroitin 500-400 MG tablet Take 2 tablets by mouth 2 (two) times daily.      Marland Kitchen HAWTHORN BERRY PO Take 1 capsule by mouth daily.       . Horse Chestnut 300 MG CAPS Take 1 capsule by mouth daily.        . metoprolol succinate (TOPROL-XL) 25 MG 24 hr tablet Take 25 mg by mouth daily.      . Multiple Vitamin (MULTIVITAMIN WITH MINERALS) TABS Take 1 tablet by mouth daily.      Marland Kitchen omeprazole (PRILOSEC) 10 MG capsule Take 10 mg by mouth daily as needed. For upset stomach      . OVER THE COUNTER MEDICATION Take 1,000 mg by mouth daily. BILLBERRY CAPSULE 1000MG        . pravastatin (PRAVACHOL) 40 MG tablet Take 40 mg by mouth daily.      . saw palmetto 160 MG capsule Take 160 mg by mouth.      . vitamin E 400 UNIT capsule Take 400 Units by mouth 2 (two) times daily.       Active Ambulatory Problems    Diagnosis Date Noted  . ANEMIA, IRON DEFICIENCY 05/06/2010  . ENDOCARDITIS, BACTERIAL, SUBACUTE 05/07/2010  . 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 SYNCOPE 05/06/2010  . DYSPNEA ON EXERTION 05/06/2010  . ECHOCARDIOGRAM, ABNORMAL 05/06/2010  . HEART MURMUR, HX OF 05/06/2010  . Acute renal insufficiency 08/25/2010  . Viridans streptococci infection 08/25/2010  . Streptococcus infection 08/25/2010  . Tooth infection 08/25/2010  . Status post PICC central line placement 08/25/2010  . Arm pain, right 08/25/2010  . Acute bacterial endocarditis 11/06/2011  . HTN (hypertension) 11/06/2011  . BPH (benign prostatic hyperplasia) 11/06/2011  . 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 Back pain 11/06/2011  . Hyperlipidemia 11/06/2011  . Colonic mass 11/10/2011   Resolved Ambulatory Problems    Diagnosis Date Noted  . No Resolved Ambulatory Problems   Past Medical History  Diagnosis Date  . Heart murmur   . Coronary artery disease   . Endocarditis   . Arthritis   . Hypertension     Soc hx= cares for his wife who is debilitated from extended hospitalization these past few months. No smoking. No drinking.  Review of Systems Constitutional: positive for fatigue. Negative for fever, chills, diaphoresis, activity change, appetite change,and unexpected weight change.  HENT: Negative for congestion, sore throat, rhinorrhea, sneezing, trouble swallowing and sinus pressure.  Eyes: Negative for photophobia and visual disturbance.  Respiratory: Negative for cough, chest tightness, shortness of breath, wheezing and stridor.  Cardiovascular: Negative for chest pain, palpitations and leg swelling.  Gastrointestinal: Negative for nausea, vomiting,  abdominal pain, diarrhea, constipation, blood in stool, abdominal distention and anal bleeding.  Genitourinary: Negative for dysuria, hematuria, flank pain and difficulty urinating.  Musculoskeletal: occ back pain. Negative for myalgias,  joint swelling, arthralgias and gait problem.  Skin: Negative for color change, pallor, rash and wound.  Neurological: Negative for dizziness, tremors, weakness and light-headedness.  Hematological: Negative for adenopathy. Does not bruise/bleed easily.  Psychiatric/Behavioral: Negative for behavioral problems, confusion, sleep disturbance, dysphoric mood, decreased concentration and agitation.       Objective:   Physical Exam BP 103/66  Pulse 77  Temp 97.4 F (36.3 C) (Oral)  Wt 187 lb (84.823 kg)  Constitutional: He is oriented to person, place, and time.  appears well-developed and well-nourished. No distress. Pale complexion HENT: [place]/AT, PERRLA, EOMI,  no scleral icterus Mouth/Throat: Oropharynx is clear and moist. No oropharyngeal exudate.  Cardiovascular: Normal rate, regular rhythm and normal heart sounds. 3/6 SEM BH at LUSB, and apex. no gallop and no friction rub.  Pulmonary/Chest: Effort normal and breath sounds normal. No respiratory distress. He has no wheezes.  Abdominal: Soft. Bowel sounds are normal. He exhibits no distension. There is no tenderness.  Lymphadenopathy:  no cervical adenopathy.  Neurological: He is alert and oriented to person, place, and time.  Skin: Skin is warm and dry. No rash noted. No erythema. Pale. No splinter hemorrhages Psychiatric: He has a normal mood and affect. His behavior is normal.   Micro: 7/31 blood cx ([facility]) strep bovis 8/2 blood cx ([facility]) strep B (c/w bovis) PCN 0.12 MIC 8/3 blood cx x 2 NGTD 8/5 blood cx x 2 NGTD Path: 11/11/11: negative for dysplasia or malignancy  Imaging: 8/02/13MRI T/L spine L4-L5 predominant lumbar spondylosis. Bilateral L4-L5 severe  facet arthritis with small  effusions. 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 Septic arthritis is considered  unlikely based on the appearance. Multifactorial spinal, lateral  recess and right greater than left foraminal stenosis at L4-L5.  2. More mild degenerative disc disease at other levels.  3. Levoconvex lumbar scoliosis.  4. Negative for diskitis/osteomyelitis  abd CT 11/10/11 Intraluminal cecal mass (4.2 x 3.6 x 2.8 cm) is suspicious for  colonic neoplasm.  2. No evidence of lymphadenopathy or metastatic disease.  3. Bilateral inguinal hernias. The left inguinal hernia contains  fat as well as a small portion of a single loop of small bowel near  the mouth of the hernia. The right inguinal hernia contains fat  only.  4. Visualized portion of the heart demonstrates cardiomegaly with  left ventricular wall thickening, intraventricular septal  thickening, left atrial prominence and some calcification in the  vicinity of the mitral valve.  5. Bilateral stable adrenal gland nodules.     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  Will have follow up appt in ID clinic to pull picc line and to ensure he is doing well after finishing course of therapy  Anemia = will get cbc on Monday, [facility] by [facility] check Hgb/HCT  Colonic mass= plan to have surgery on sept 20th for cecal mass resection. Unclear if he needs pre-op evaluation. Will copy note to dr. Ezzard Standing to decide if needs to be seen by Dr. Jacinto Halim. Patient has recent TTE and TEE as part of endocarditis work up.   Cc: - Surgery-> Dr. Ezzard Standing - cards-> Dr. Yates Decamp - PCP -> Dr. Melton Alar  rtc  8/30

## 2011-11-25 NOTE — Telephone Encounter (Signed)
Called [facility] to have a CBC added to his scheduled lab work for 11/30/11 per Dr Drue Second. Advised that his case manager is Morrie Sheldon and given her voicemail. I left a message for her to add it per Dr Drue Second and left my call back info just in case she has a question.

## 2011-11-27 DIAGNOSIS — K639 Disease of intestine, unspecified: Secondary | ICD-10-CM | POA: Diagnosis not present

## 2011-11-27 DIAGNOSIS — Z452 Encounter for adjustment and management of vascular access device: Secondary | ICD-10-CM | POA: Diagnosis not present

## 2011-11-27 DIAGNOSIS — Z5181 Encounter for therapeutic drug level monitoring: Secondary | ICD-10-CM | POA: Diagnosis not present

## 2011-11-27 DIAGNOSIS — I330 Acute and subacute infective endocarditis: Secondary | ICD-10-CM | POA: Diagnosis not present

## 2011-11-27 DIAGNOSIS — I2510 Atherosclerotic heart disease of native coronary artery without angina pectoris: Secondary | ICD-10-CM | POA: Diagnosis not present

## 2011-11-30 DIAGNOSIS — Z7902 Long term (current) use of antithrombotics/antiplatelets: Secondary | ICD-10-CM | POA: Diagnosis not present

## 2011-11-30 DIAGNOSIS — Z452 Encounter for adjustment and management of vascular access device: Secondary | ICD-10-CM | POA: Diagnosis not present

## 2011-11-30 DIAGNOSIS — I2510 Atherosclerotic heart disease of native coronary artery without angina pectoris: Secondary | ICD-10-CM | POA: Diagnosis not present

## 2011-11-30 DIAGNOSIS — I330 Acute and subacute infective endocarditis: Secondary | ICD-10-CM | POA: Diagnosis not present

## 2011-11-30 DIAGNOSIS — K639 Disease of intestine, unspecified: Secondary | ICD-10-CM | POA: Diagnosis not present

## 2011-11-30 DIAGNOSIS — Z5181 Encounter for therapeutic drug level monitoring: Secondary | ICD-10-CM | POA: Diagnosis not present

## 2011-12-02 ENCOUNTER — Encounter: Payer: Self-pay | Admitting: Infectious Disease

## 2011-12-04 ENCOUNTER — Ambulatory Visit (INDEPENDENT_AMBULATORY_CARE_PROVIDER_SITE_OTHER): Payer: Medicare Other | Admitting: Internal Medicine

## 2011-12-04 ENCOUNTER — Encounter: Payer: Self-pay | Admitting: Internal Medicine

## 2011-12-04 VITALS — BP 110/69 | HR 87 | Temp 98.3°F | Ht 72.0 in | Wt 187.0 lb

## 2011-12-04 DIAGNOSIS — I330 Acute and subacute infective endocarditis: Secondary | ICD-10-CM

## 2011-12-04 NOTE — Assessment & Plan Note (Signed)
He has completed therapy for probable endocarditis with ceftriaxone. His PICC line will be pulled today. He will return on a p.r.n. Basis.

## 2011-12-04 NOTE — Progress Notes (Signed)
  Subjective:    Patient ID: [name], male    DOB: Aug 30, 1940, 71 y.o.   MRN: [id number]  HPI He comes in for followup of probable endocarditis. He previously has had strep bovis endocarditis and has been treated 2 other times in today he is here completing his third treatment though no positive organism this time. He was noted to have a cecal mass and is to have surgery at this in September. He is overall feeling okay but does complain of some weakness. He denies any fever or chills.   Review of Systems  Constitutional: Positive for fatigue. Negative for fever, chills and unexpected weight change.  Cardiovascular: Negative for chest pain, palpitations and leg swelling.  Gastrointestinal: Negative for nausea and diarrhea.  Skin: Negative for rash.       Objective:   Physical Exam        Assessment & Plan:

## 2011-12-08 ENCOUNTER — Other Ambulatory Visit (HOSPITAL_COMMUNITY): Payer: Self-pay

## 2011-12-11 ENCOUNTER — Encounter (HOSPITAL_COMMUNITY): Payer: Self-pay | Admitting: Pharmacy Technician

## 2011-12-14 ENCOUNTER — Encounter: Payer: Self-pay | Admitting: Infectious Disease

## 2011-12-14 ENCOUNTER — Other Ambulatory Visit (INDEPENDENT_AMBULATORY_CARE_PROVIDER_SITE_OTHER): Payer: Self-pay | Admitting: Surgery

## 2011-12-15 ENCOUNTER — Encounter (INDEPENDENT_AMBULATORY_CARE_PROVIDER_SITE_OTHER): Payer: Self-pay | Admitting: Surgery

## 2011-12-15 ENCOUNTER — Ambulatory Visit (INDEPENDENT_AMBULATORY_CARE_PROVIDER_SITE_OTHER): Payer: Medicare Other | Admitting: Surgery

## 2011-12-15 VITALS — BP 114/72 | HR 72 | Temp 98.5°F | Resp 12 | Ht 72.0 in | Wt 188.5 lb

## 2011-12-15 DIAGNOSIS — K6389 Other specified diseases of intestine: Secondary | ICD-10-CM | POA: Diagnosis not present

## 2011-12-15 NOTE — Progress Notes (Signed)
[facility]  William Kin, MD,  FACS [address]  [address] [place], [place]    [address] Phone:  [phone] FAX:  [phone]   Re:   [name] DOB:   17-Apr-1940 MRN:   [id number]  ASSESSMENT AND PLAN: 1.  Right colon mass.  Seen by Dr. Greggory Brandy on colonoscopy on 11/11/2011.  Bxes revealed tubo-villous adenoma.  I reviewed with the patient the findings and need for colon surgery.  I discussed both surgical and non surgical options.  I discussed the role of laparoscopic and open surgery in colon surgery.  I reviewed the risks of surgery, including, but not limited to, infection, bleeding, nerve injury, anastomotic leaks, and possibility of colostomy.  I went over the preoperative mechanical and antibiotic bowel prep for colon surgery and provided prescriptions for these.  I provided the patient literature on colon surgery.  I tried to answer all questions for the patient.  Will plan to admit the patient the night before surgery for hydration and cardiology involvement.  So he will do his colon prep at home and I will give his oral antibx at the hospital.  2.  Strep Bovis endocarditis.  Treated  Followed by Dr. Daiva Eves  3.  HYPERTROPHIC OBSTRUCTIVE CARDIOMYOPATHY   Sees Dr. Jeanella Cara.  We agreed on admitting him the night before surgery and I will let Dr. Jacinto Halim know of his admission.  4.  HTN (hypertension)  5.  BPH (benign prostatic hyperplasia)  6.  Subaortic stenosis   7.  Back pain  8.  Hyperlipidemia  9.  On Plavix, which he is holding.  Will stop ASA 5 days ahead. 10.  Bilateral inguinal hernias.  Right mildly symptomatic.  We will address these at a later date.  HISTORY OF PRESENT ILLNESS: Chief Complaint  Patient presents with  . Pre-op Exam    [name] is a 71 y.o. (DOB: 06/03/1940)  white male who is a patient of PANG,RICHARD, MD and comes to me today for pre op evaluation for colon resection.  I saw him in the hospital  during a hospitalization for bacterial endocarditis. He has a history of 2 prior episodes of strep bacteremia and endocarditis.  He has a right colon mass, benign on biopsy by Dr. Madilyn Fireman, which is probably related to his recurrent cardiac infections.  He comes for pre-op discussion and review of planned surgery.  Current Outpatient Prescriptions  Medication Sig Dispense Refill  . Ascorbic Acid (VITAMIN C) 500 MG tablet Take 500 mg by mouth 2 (two) times daily.        . cholecalciferol (VITAMIN D) 1000 UNITS tablet Take 2,000 Units by mouth daily.       . Coenzyme Q10 (EQL COQ10) 300 MG CAPS Take 1 capsule by mouth daily.        . Ginkgo Biloba 60 MG CAPS Take 1 capsule by mouth daily.        Marland Kitchen glucosamine-chondroitin 500-400 MG tablet Take 2 tablets by mouth 2 (two) times daily.      Marland Kitchen HAWTHORN BERRY PO Take 1 capsule by mouth daily.       . Horse Chestnut 300 MG CAPS Take 1 capsule by mouth daily.        . metoprolol succinate (TOPROL-XL) 25 MG 24 hr tablet Take 25 mg by mouth daily.      . Multiple Vitamin (MULTIVITAMIN WITH MINERALS) TABS Take 1 tablet by mouth daily.      Marland Kitchen  omeprazole (PRILOSEC) 10 MG capsule Take 10 mg by mouth daily as needed. For upset stomach      . OVER THE COUNTER MEDICATION Take 1,000 mg by mouth daily. BILLBERRY CAPSULE 1000MG       . pravastatin (PRAVACHOL) 40 MG tablet Take 40 mg by mouth daily.      . saw palmetto 160 MG capsule Take 160 mg by mouth.      . vitamin E 400 UNIT capsule Take 400 Units by mouth 2 (two) times daily.         PHYSICAL EXAM: BP 114/72  Pulse 72  Temp 98.5 F (36.9 C) (Oral)  Resp 12  Ht 6' (1.829 m)  Wt 188 lb 8 oz (85.503 kg)  BMI 25.57 kg/m2  General: WN WM who is alert and generally healthy appearing.  Abdomen: Soft. No mass. No tenderness. No hernia. Normal bowel sounds.  No abdominal scars.  DATA REVIEWED: Labs and notes.  William Kin, MD, FACS Office:  [phone]

## 2011-12-17 ENCOUNTER — Encounter (HOSPITAL_COMMUNITY)
Admission: RE | Admit: 2011-12-17 | Discharge: 2011-12-17 | Disposition: A | Discharge status: Home / Self Care | Payer: Medicare Other | Source: Ambulatory Visit | Attending: Surgery | Admitting: Surgery

## 2011-12-17 ENCOUNTER — Encounter (HOSPITAL_COMMUNITY): Payer: Self-pay

## 2011-12-17 DIAGNOSIS — I10 Essential (primary) hypertension: Secondary | ICD-10-CM | POA: Diagnosis not present

## 2011-12-17 DIAGNOSIS — Z01811 Encounter for preprocedural respiratory examination: Secondary | ICD-10-CM | POA: Diagnosis not present

## 2011-12-17 DIAGNOSIS — D126 Benign neoplasm of colon, unspecified: Secondary | ICD-10-CM | POA: Diagnosis not present

## 2011-12-17 DIAGNOSIS — N400 Enlarged prostate without lower urinary tract symptoms: Secondary | ICD-10-CM | POA: Diagnosis not present

## 2011-12-17 DIAGNOSIS — I421 Obstructive hypertrophic cardiomyopathy: Secondary | ICD-10-CM | POA: Diagnosis not present

## 2011-12-17 HISTORY — DX: Acute myocardial infarction, unspecified: I21.9

## 2011-12-17 LAB — ABO/RH: ABO/RH(D): O POS

## 2011-12-17 LAB — CBC
HCT: 34.3 % — ABNORMAL LOW (ref 39.0–52.0)
Hemoglobin: 11.3 g/dL — ABNORMAL LOW (ref 13.0–17.0)
MCH: 29.3 pg (ref 26.0–34.0)
MCHC: 32.9 g/dL (ref 30.0–36.0)
MCV: 88.9 fL (ref 78.0–100.0)
RDW: 14.1 % (ref 11.5–15.5)

## 2011-12-17 LAB — SURGICAL PCR SCREEN: Staphylococcus aureus: NEGATIVE

## 2011-12-17 LAB — BASIC METABOLIC PANEL
BUN: 15 mg/dL (ref 6–23)
Chloride: 103 mEq/L (ref 96–112)
Creatinine, Ser: 1.12 mg/dL (ref 0.50–1.35)
Glucose, Bld: 93 mg/dL (ref 70–99)
Potassium: 4.2 mEq/L (ref 3.5–5.1)

## 2011-12-17 NOTE — Pre-Procedure Instructions (Signed)
[name]  12/17/2011   Your procedure is scheduled on: Friday  12/25/11    Report to [facility] at 730 AM.  Call this number if you have problems the morning of surgery: [phone]   Remember:   Do not eat food OR DRINK :After Midnight.   Take these medicines the morning of surgery with A SIP OF WATER: TOPROL(METOPROLOL), PRILOSEC (STOP ASPIRIN, COUMADIN, PLAVIX, EFFIENT, HERBAL MEDICIATIONS)   Do not wear jewelry, make-up or nail polish.  Do not wear lotions, powders, or perfumes. You may wear deodorant.  Do not shave 48 hours prior to surgery. Men may shave face and neck.  Do not bring valuables to the hospital.  Contacts, dentures or bridgework may not be worn into surgery.  Leave suitcase in the car. After surgery it may be brought to your room.  For patients admitted to the hospital, checkout time is 11:00 AM the day of discharge.   Patients discharged the day of surgery will not be allowed to drive home.  Name and phone number of your driver:   Special Instructions: CHG Shower Use Special Wash: 1/2 bottle night before surgery and 1/2 bottle morning of surgery.   Please read over the following fact sheets that you were given: Pain Booklet, Coughing and Deep Breathing, Blood Transfusion Information, MRSA Information and Surgical Site Infection Prevention

## 2011-12-17 NOTE — Progress Notes (Signed)
PATIENT STATED HE WAS GOING TO BE ADMITTED ON  12/24/11.   SPOKE WITH NURSE AT DR NEWMAN'S OFFICE WHO STATED IT WOULD BE FINE TO DO ALL REGULAR PREOP LABS, CXR, EKG, NASAL SWAB.  PATIENT WAS GIVEN [facility] NUMBER TO CALL AND CHECK  ON STATUS OF PCR SCREEN.

## 2011-12-17 NOTE — Progress Notes (Signed)
PER ALLISON ZELENAK PA, WE SHOULD GET TYPE AND SCREEN WITH PAT LABS.

## 2011-12-21 ENCOUNTER — Telehealth (INDEPENDENT_AMBULATORY_CARE_PROVIDER_SITE_OTHER): Payer: Self-pay

## 2011-12-21 NOTE — Telephone Encounter (Signed)
Patient called in saying he had a crown come lose and wanted to know if it was ok to have dental work done this week before his colon surgery on Friday. Paged Dr. Ezzard Standing and he said it was fine. Relayed message to patient it was ok to proceed with dental work.

## 2011-12-24 ENCOUNTER — Encounter (HOSPITAL_COMMUNITY): Payer: Self-pay | Admitting: General Practice

## 2011-12-24 ENCOUNTER — Inpatient Hospital Stay (HOSPITAL_COMMUNITY)
Admission: RE | Admit: 2011-12-24 | Discharge: 2012-01-01 | DRG: 330 | Disposition: A | Discharge status: Home / Self Care | Payer: Medicare Other | Source: Ambulatory Visit | Attending: Surgery | Admitting: Surgery

## 2011-12-24 DIAGNOSIS — I2510 Atherosclerotic heart disease of native coronary artery without angina pectoris: Secondary | ICD-10-CM | POA: Diagnosis not present

## 2011-12-24 DIAGNOSIS — I359 Nonrheumatic aortic valve disorder, unspecified: Secondary | ICD-10-CM | POA: Diagnosis present

## 2011-12-24 DIAGNOSIS — I252 Old myocardial infarction: Secondary | ICD-10-CM | POA: Diagnosis not present

## 2011-12-24 DIAGNOSIS — M549 Dorsalgia, unspecified: Secondary | ICD-10-CM | POA: Diagnosis not present

## 2011-12-24 DIAGNOSIS — D371 Neoplasm of uncertain behavior of stomach: Secondary | ICD-10-CM | POA: Diagnosis not present

## 2011-12-24 DIAGNOSIS — K219 Gastro-esophageal reflux disease without esophagitis: Secondary | ICD-10-CM | POA: Diagnosis present

## 2011-12-24 DIAGNOSIS — K6389 Other specified diseases of intestine: Secondary | ICD-10-CM

## 2011-12-24 DIAGNOSIS — D126 Benign neoplasm of colon, unspecified: Secondary | ICD-10-CM | POA: Diagnosis not present

## 2011-12-24 DIAGNOSIS — K389 Disease of appendix, unspecified: Secondary | ICD-10-CM | POA: Diagnosis not present

## 2011-12-24 DIAGNOSIS — Z7982 Long term (current) use of aspirin: Secondary | ICD-10-CM | POA: Diagnosis not present

## 2011-12-24 DIAGNOSIS — Z9861 Coronary angioplasty status: Secondary | ICD-10-CM

## 2011-12-24 DIAGNOSIS — Z79899 Other long term (current) drug therapy: Secondary | ICD-10-CM | POA: Diagnosis not present

## 2011-12-24 DIAGNOSIS — K4020 Bilateral inguinal hernia, without obstruction or gangrene, not specified as recurrent: Secondary | ICD-10-CM | POA: Diagnosis present

## 2011-12-24 DIAGNOSIS — E876 Hypokalemia: Secondary | ICD-10-CM | POA: Diagnosis not present

## 2011-12-24 DIAGNOSIS — N400 Enlarged prostate without lower urinary tract symptoms: Secondary | ICD-10-CM | POA: Diagnosis present

## 2011-12-24 DIAGNOSIS — Z01810 Encounter for preprocedural cardiovascular examination: Secondary | ICD-10-CM

## 2011-12-24 DIAGNOSIS — I10 Essential (primary) hypertension: Secondary | ICD-10-CM | POA: Diagnosis not present

## 2011-12-24 DIAGNOSIS — N4889 Other specified disorders of penis: Secondary | ICD-10-CM | POA: Diagnosis not present

## 2011-12-24 DIAGNOSIS — I959 Hypotension, unspecified: Secondary | ICD-10-CM | POA: Diagnosis not present

## 2011-12-24 DIAGNOSIS — I2789 Other specified pulmonary heart diseases: Secondary | ICD-10-CM | POA: Diagnosis present

## 2011-12-24 DIAGNOSIS — R0602 Shortness of breath: Secondary | ICD-10-CM | POA: Diagnosis not present

## 2011-12-24 DIAGNOSIS — D696 Thrombocytopenia, unspecified: Secondary | ICD-10-CM | POA: Diagnosis not present

## 2011-12-24 DIAGNOSIS — E785 Hyperlipidemia, unspecified: Secondary | ICD-10-CM | POA: Diagnosis present

## 2011-12-24 DIAGNOSIS — I421 Obstructive hypertrophic cardiomyopathy: Secondary | ICD-10-CM | POA: Diagnosis not present

## 2011-12-24 DIAGNOSIS — Z01812 Encounter for preprocedural laboratory examination: Secondary | ICD-10-CM

## 2011-12-24 DIAGNOSIS — N289 Disorder of kidney and ureter, unspecified: Secondary | ICD-10-CM | POA: Diagnosis not present

## 2011-12-24 DIAGNOSIS — M129 Arthropathy, unspecified: Secondary | ICD-10-CM | POA: Diagnosis present

## 2011-12-24 DIAGNOSIS — Z01818 Encounter for other preprocedural examination: Secondary | ICD-10-CM

## 2011-12-24 HISTORY — DX: Low back pain, unspecified: M54.50

## 2011-12-24 HISTORY — DX: Pure hypercholesterolemia, unspecified: E78.00

## 2011-12-24 HISTORY — DX: Other chronic pain: G89.29

## 2011-12-24 HISTORY — DX: Low back pain: M54.5

## 2011-12-24 HISTORY — DX: Pneumonia, unspecified organism: J18.9

## 2011-12-24 HISTORY — DX: Migraine, unspecified, not intractable, without status migrainosus: G43.909

## 2011-12-24 MED ORDER — KCL IN DEXTROSE-NACL 20-5-0.45 MEQ/L-%-% IV SOLN
INTRAVENOUS | Status: DC
  Administered 2011-12-24 – 2011-12-25 (×2): via INTRAVENOUS
  Administered 2011-12-25: 125 mL via INTRAVENOUS
  Filled 2011-12-24 (×7): qty 1000

## 2011-12-24 MED ORDER — DEXTROSE 5 % IV SOLN
2.0000 g | INTRAVENOUS | Status: AC
  Administered 2011-12-25: 2 g via INTRAVENOUS
  Filled 2011-12-24: qty 2

## 2011-12-24 MED ORDER — ERYTHROMYCIN BASE 250 MG PO TABS
1000.0000 mg | ORAL_TABLET | ORAL | Status: AC
  Administered 2011-12-24 – 2011-12-25 (×3): 1000 mg via ORAL
  Filled 2011-12-24 (×3): qty 4

## 2011-12-24 MED ORDER — HEPARIN SODIUM (PORCINE) 5000 UNIT/ML IJ SOLN
5000.0000 [IU] | Freq: Once | INTRAMUSCULAR | Status: AC
  Administered 2011-12-25: 5000 [IU] via SUBCUTANEOUS
  Filled 2011-12-24: qty 1

## 2011-12-24 MED ORDER — CHLORHEXIDINE GLUCONATE 4 % EX LIQD
1.0000 "application " | Freq: Once | CUTANEOUS | Status: AC
  Administered 2011-12-24: 1 via TOPICAL
  Filled 2011-12-24: qty 15

## 2011-12-24 MED ORDER — CHLORHEXIDINE GLUCONATE 4 % EX LIQD
1.0000 "application " | Freq: Once | CUTANEOUS | Status: AC
  Administered 2011-12-25: 1 via TOPICAL
  Filled 2011-12-24 (×2): qty 15

## 2011-12-24 MED ORDER — NEOMYCIN SULFATE 500 MG PO TABS
1000.0000 mg | ORAL_TABLET | ORAL | Status: AC
  Administered 2011-12-24 (×3): 1000 mg via ORAL
  Filled 2011-12-24 (×3): qty 2

## 2011-12-24 MED ORDER — ALVIMOPAN 12 MG PO CAPS
12.0000 mg | ORAL_CAPSULE | Freq: Once | ORAL | Status: AC
  Administered 2011-12-25: 12 mg via ORAL
  Filled 2011-12-24: qty 1

## 2011-12-24 NOTE — Progress Notes (Signed)
When I arrived, pt's son was bedside. Pt talked about why he is here. Pt and son interacted throughout visit. Pt and son participated in visit. Son shared several stories as pt listened - although pt participated in conversations. Pt asked for prayer when I offered to him any further services. Pt and son were very thankful for the prayer.   Marjory Lies Chaplain

## 2011-12-24 NOTE — Progress Notes (Signed)
Pt finished drinking Go Lytely at 4pm. Will wait 45 minutes before starting PO antibiotics per Dr. Ezzard Standing.

## 2011-12-24 NOTE — Progress Notes (Signed)
Pt started drinking Go Lytely as instructed by Dr. Ezzard Standing at approx 11:30am. Pt's instructions from home say to drink entire drink before noon, so pt has been encouraged to drink entire bottle. Will continue to monitor his progress. Dr. Allene Pyo office has been contacted for admission orders. Will await further instructions. Pt is alert and oriented. VSS. Oriented pt to room. Safety video viewed. Call bell within reach.

## 2011-12-24 NOTE — H&P (Signed)
Re: [name]  DOB: 05-11-1940  MRN: [id number]   ASSESSMENT AND PLAN:  1. Right colon mass.   Seen by Dr. Greggory Brandy on colonoscopy on 11/11/2011. Bxes revealed tubo-villous adenoma.   I reviewed with the patient the findings and need for colon surgery. I discussed both surgical and non surgical options. I discussed the role of laparoscopic and open surgery in colon surgery. I reviewed the risks of surgery, including, but not limited to, infection, bleeding, nerve injury, anastomotic leaks, and possibility of colostomy.    He is admitted the night before surgery for hydration and cardiology involvement.  Admitted to [facility] [place].  He has completed his bowel prep.  He has his antibiotic prep ordered.  I will hydrate him tonight.  I went over again the plan with the patient.  For lap assisted right hemicolectomy in the AM.  2. Strep Bovis endocarditis. Treated   Followed by Dr. Daiva Eves  3. HYPERTROPHIC OBSTRUCTIVE CARDIOMYOPATHY   Sees Dr. Jeanella Cara.   I let Dr. Jacinto Halim know of his admission.   4. HTN (hypertension)  5. BPH (benign prostatic hyperplasia)  6. Subaortic stenosis  7. Back pain  8. Hyperlipidemia  9. On Plavix, which he is holding. Will stop ASA 5 days ahead.  10. Bilateral inguinal hernias. Right mildly symptomatic.   We will address these at a later date.  HISTORY OF PRESENT ILLNESS:  [name] is a 71 y.o. (DOB: 1940/07/30) white male who is a patient of [name],RICHARD, MD and is admitted for a right colon resection.   I saw him in the hospital during a hospitalization from 11/06/2011 to 11/13/2011 for bacterial endocarditis. He has a history of 2 prior episodes of strep bacteremia and endocarditis.   Abdominal/pelvic CT scan of 11/10/11, showed intraluminal cecal mass (4.2 x 3.6 x 2.8 cm) is suspicious for colonic neoplasm, but no evidence of lymphadenopathy or metastatic disease. Dr. Dorena Cookey, performed colonoscopy on 11/11/2011 which showed 3-4 cm cecal mass,  nonobstructing.  The colonoscopy biopsies were benign on biopsy by Dr. Madilyn Fireman and the colonic mass is thought to probably be related to his recurrent cardiac infections.  One of his cardiac infections was Strep. Bovis, associated with colonic polyps/tumors.  He now comes for removal of the right colon.  Current Outpatient Prescriptions   Medication  Sig  Dispense  Refill   .  Ascorbic Acid (VITAMIN C) 500 MG tablet  Take 500 mg by mouth 2 (two) times daily.     .  cholecalciferol (VITAMIN D) 1000 UNITS tablet  Take 2,000 Units by mouth daily.     .  Coenzyme Q10 (EQL COQ10) 300 MG CAPS  Take 1 capsule by mouth daily.     .  Ginkgo Biloba 60 MG CAPS  Take 1 capsule by mouth daily.     Marland Kitchen  glucosamine-chondroitin 500-400 MG tablet  Take 2 tablets by mouth 2 (two) times daily.     Marland Kitchen  HAWTHORN BERRY PO  Take 1 capsule by mouth daily.     .  Horse Chestnut 300 MG CAPS  Take 1 capsule by mouth daily.     .  metoprolol succinate (TOPROL-XL) 25 MG 24 hr tablet  Take 25 mg by mouth daily.     .  Multiple Vitamin (MULTIVITAMIN WITH MINERALS) TABS  Take 1 tablet by mouth daily.     Marland Kitchen  omeprazole (PRILOSEC) 10 MG capsule  Take 10 mg by mouth daily as needed. For  upset stomach     .  OVER THE COUNTER MEDICATION  Take 1,000 mg by mouth daily. BILLBERRY CAPSULE 1000MG      .  pravastatin (PRAVACHOL) 40 MG tablet  Take 40 mg by mouth daily.     .  saw palmetto 160 MG capsule  Take 160 mg by mouth.     .  vitamin E 400 UNIT capsule  Take 400 Units by mouth 2 (two) times daily.      ROS: Cardiac:  TEE was done on 11/06/11, by Dr Yates Decamp, cardiology, and showed no vegetations. The right ventricular systolic pressure was increased consistent with mild pulmonary hypertension   PHYSICAL EXAM:  BP 112/69  Pulse 81  Temp 97.4 F (36.3 C) (Oral)  Resp 18  Ht 6' (1.829 m)  Wt 188 lb (85.276 kg)  BMI 25.50 kg/m2  SpO2 99%  General: WN WM who is alert and generally healthy appearing.  Lungs:  Clear Heart:   RRR Abdomen: Soft. No mass. No tenderness. No hernia. Normal bowel sounds. No abdominal scars.   DATA REVIEWED:  Labs and notes.   [name], MD, FACS  Office: [phone]

## 2011-12-24 NOTE — Progress Notes (Signed)
Clinical Social Work Department BRIEF PSYCHOSOCIAL ASSESSMENT 12/24/2011  Patient:  [name], [name]     Account Number:  [id number]     Admit date:  12/24/2011  Clinical Social Worker:  Dennison Bulla  Date/Time:  12/24/2011 03:15 PM  Referred by:  Physician  Date Referred:  12/24/2011 Referred for  Advanced Directives   Other Referral:   Interview type:  Patient Other interview type:   Son present    PSYCHOSOCIAL DATA Living Status:  FAMILY Admitted from facility:   Level of care:   Primary support name:  [place] [employer] Primary support relationship to patient:  SPOUSE Degree of support available:   Adequate    CURRENT CONCERNS Current Concerns  Other - See comment   Other Concerns:   Advanced directives    SOCIAL WORK ASSESSMENT / PLAN CSW received referral for advanced directives. CSW reviewed chart and met with patient at bedside. Son present. Patient agreeable to son being involved in assessment.    CSW introduced myself and explained role. CSW provided patient with packet and explained directions. Patient reports he wants to speak with family before making any decisions. CSW reminded patient that packet has to be notarized and informed him that CSW could notarize. If patient does not wish to complete packet in hospital, CSW informed patient of places that notarize documents.    Patient has CSW contact information if interested in completing packet during hospitalization. CSW is signing off but available if needed.   Assessment/plan status:  No Further Intervention Required Other assessment/ plan:   Information/referral to community resources:   Advanced directives    PATIENT'S/FAMILY'S RESPONSE TO PLAN OF CARE: Patient was alert and oriented. Patient engaged throughout assessment.

## 2011-12-25 ENCOUNTER — Encounter (HOSPITAL_COMMUNITY)
Admission: RE | Disposition: A | Discharge status: Home / Self Care | Payer: Self-pay | Source: Ambulatory Visit | Attending: Surgery

## 2011-12-25 ENCOUNTER — Encounter (HOSPITAL_COMMUNITY): Payer: Self-pay | Admitting: Anesthesiology

## 2011-12-25 ENCOUNTER — Inpatient Hospital Stay (HOSPITAL_COMMUNITY): Payer: Medicare Other | Admitting: Anesthesiology

## 2011-12-25 DIAGNOSIS — D378 Neoplasm of uncertain behavior of other specified digestive organs: Secondary | ICD-10-CM

## 2011-12-25 DIAGNOSIS — D375 Neoplasm of uncertain behavior of rectum: Secondary | ICD-10-CM

## 2011-12-25 DIAGNOSIS — D371 Neoplasm of uncertain behavior of stomach: Secondary | ICD-10-CM

## 2011-12-25 HISTORY — PX: COLON RESECTION: SHX5231

## 2011-12-25 SURGERY — COLON RESECTION LAPAROSCOPIC
Anesthesia: General | Site: Abdomen | Laterality: Right | Wound class: Clean Contaminated

## 2011-12-25 MED ORDER — ALVIMOPAN 12 MG PO CAPS
12.0000 mg | ORAL_CAPSULE | Freq: Two times a day (BID) | ORAL | Status: DC
  Administered 2011-12-26 – 2011-12-28 (×6): 12 mg via ORAL
  Filled 2011-12-25 (×9): qty 1

## 2011-12-25 MED ORDER — FENTANYL CITRATE 0.05 MG/ML IJ SOLN
INTRAMUSCULAR | Status: DC | PRN
  Administered 2011-12-25 (×3): 50 ug via INTRAVENOUS

## 2011-12-25 MED ORDER — PHENYLEPHRINE HCL 10 MG/ML IJ SOLN
INTRAMUSCULAR | Status: DC | PRN
  Administered 2011-12-25: 80 ug via INTRAVENOUS
  Administered 2011-12-25 (×2): 40 ug via INTRAVENOUS
  Administered 2011-12-25: 80 ug via INTRAVENOUS

## 2011-12-25 MED ORDER — NALOXONE HCL 0.4 MG/ML IJ SOLN: 0.4000 mg | INTRAMUSCULAR | Status: DC | PRN

## 2011-12-25 MED ORDER — HYDROMORPHONE HCL PF 1 MG/ML IJ SOLN
0.2500 mg | INTRAMUSCULAR | Status: DC | PRN
  Administered 2011-12-25 (×5): 0.5 mg via INTRAVENOUS

## 2011-12-25 MED ORDER — HEPARIN SODIUM (PORCINE) 5000 UNIT/ML IJ SOLN
5000.0000 [IU] | Freq: Three times a day (TID) | INTRAMUSCULAR | Status: DC
  Administered 2011-12-25 – 2012-01-01 (×20): 5000 [IU] via SUBCUTANEOUS
  Filled 2011-12-25 (×25): qty 1

## 2011-12-25 MED ORDER — DEXTROSE-NACL 5-0.9 % IV SOLN
INTRAVENOUS | Status: DC
  Administered 2011-12-25 – 2011-12-27 (×6): via INTRAVENOUS

## 2011-12-25 MED ORDER — KCL IN DEXTROSE-NACL 20-5-0.45 MEQ/L-%-% IV SOLN
INTRAVENOUS | Status: DC
  Filled 2011-12-25 (×3): qty 1000

## 2011-12-25 MED ORDER — SODIUM CHLORIDE 0.9 % IR SOLN
Status: DC | PRN
  Administered 2011-12-25: 1

## 2011-12-25 MED ORDER — ALBUMIN HUMAN 5 % IV SOLN
INTRAVENOUS | Status: DC | PRN
  Administered 2011-12-25: 10:00:00 via INTRAVENOUS

## 2011-12-25 MED ORDER — DIPHENHYDRAMINE HCL 12.5 MG/5ML PO ELIX
12.5000 mg | ORAL_SOLUTION | Freq: Four times a day (QID) | ORAL | Status: DC | PRN
  Filled 2011-12-25: qty 5

## 2011-12-25 MED ORDER — NEOSTIGMINE METHYLSULFATE 1 MG/ML IJ SOLN
INTRAMUSCULAR | Status: DC | PRN
  Administered 2011-12-25: 5 mg via INTRAVENOUS

## 2011-12-25 MED ORDER — ROCURONIUM BROMIDE 100 MG/10ML IV SOLN
INTRAVENOUS | Status: DC | PRN
  Administered 2011-12-25: 30 mg via INTRAVENOUS
  Administered 2011-12-25: 50 mg via INTRAVENOUS

## 2011-12-25 MED ORDER — PROPRANOLOL HCL 1 MG/ML IV SOLN
INTRAVENOUS | Status: DC | PRN
  Administered 2011-12-25: .2 mg via INTRAVENOUS
  Administered 2011-12-25: .1 mg via INTRAVENOUS

## 2011-12-25 MED ORDER — LACTATED RINGERS IV SOLN
INTRAVENOUS | Status: DC | PRN
  Administered 2011-12-25 (×4): via INTRAVENOUS

## 2011-12-25 MED ORDER — ONDANSETRON HCL 4 MG/2ML IJ SOLN
INTRAMUSCULAR | Status: DC | PRN
  Administered 2011-12-25: 4 mg via INTRAVENOUS

## 2011-12-25 MED ORDER — BUPIVACAINE HCL (PF) 0.25 % IJ SOLN
INTRAMUSCULAR | Status: DC | PRN
  Administered 2011-12-25: 30 mL

## 2011-12-25 MED ORDER — DEXTROSE 5 % IV SOLN
1.0000 g | Freq: Three times a day (TID) | INTRAVENOUS | Status: AC
  Administered 2011-12-25 – 2011-12-26 (×2): 1 g via INTRAVENOUS
  Filled 2011-12-25 (×3): qty 1

## 2011-12-25 MED ORDER — SODIUM CHLORIDE 0.9 % IJ SOLN: 9.0000 mL | INTRAMUSCULAR | Status: DC | PRN

## 2011-12-25 MED ORDER — METOPROLOL TARTRATE 1 MG/ML IV SOLN
5.0000 mg | Freq: Four times a day (QID) | INTRAVENOUS | Status: DC
  Filled 2011-12-25 (×4): qty 5

## 2011-12-25 MED ORDER — DEXTROSE 5 % AND 0.9 % NACL IV BOLUS
500.0000 mL | Freq: Once | INTRAVENOUS | Status: AC
  Administered 2011-12-25: 500 mL via INTRAVENOUS

## 2011-12-25 MED ORDER — ONDANSETRON HCL 4 MG/2ML IJ SOLN: 4.0000 mg | Freq: Four times a day (QID) | INTRAMUSCULAR | Status: DC | PRN

## 2011-12-25 MED ORDER — LACTATED RINGERS IV SOLN
INTRAVENOUS | Status: DC
  Administered 2011-12-25: 09:00:00 via INTRAVENOUS

## 2011-12-25 MED ORDER — 0.9 % SODIUM CHLORIDE (POUR BTL) OPTIME
TOPICAL | Status: DC | PRN
  Administered 2011-12-25 (×3): 2000 mL

## 2011-12-25 MED ORDER — PROPOFOL 10 MG/ML IV BOLUS
INTRAVENOUS | Status: DC | PRN
  Administered 2011-12-25: 160 mg via INTRAVENOUS

## 2011-12-25 MED ORDER — DEXTROSE 5 % IV SOLN
INTRAVENOUS | Status: DC | PRN
  Administered 2011-12-25: 10:00:00 via INTRAVENOUS

## 2011-12-25 MED ORDER — DIPHENHYDRAMINE HCL 50 MG/ML IJ SOLN: 12.5000 mg | Freq: Four times a day (QID) | INTRAMUSCULAR | Status: DC | PRN

## 2011-12-25 MED ORDER — ONDANSETRON HCL 4 MG/2ML IJ SOLN: 4.0000 mg | Freq: Once | INTRAMUSCULAR | Status: DC | PRN

## 2011-12-25 MED ORDER — GLYCOPYRROLATE 0.2 MG/ML IJ SOLN
INTRAMUSCULAR | Status: DC | PRN
  Administered 2011-12-25: .8 mg via INTRAVENOUS

## 2011-12-25 MED ORDER — LIDOCAINE HCL (CARDIAC) 20 MG/ML IV SOLN
INTRAVENOUS | Status: DC | PRN
  Administered 2011-12-25: 100 mg via INTRAVENOUS

## 2011-12-25 MED ORDER — ONDANSETRON HCL 4 MG PO TABS: 4.0000 mg | ORAL_TABLET | Freq: Four times a day (QID) | ORAL | Status: DC | PRN

## 2011-12-25 MED ORDER — MORPHINE SULFATE (PF) 1 MG/ML IV SOLN
INTRAVENOUS | Status: DC
  Administered 2011-12-25: 20:00:00 via INTRAVENOUS
  Administered 2011-12-26: 9 mg via INTRAVENOUS
  Administered 2011-12-26: 06:00:00 via INTRAVENOUS
  Administered 2011-12-26: 3 mg via INTRAVENOUS
  Administered 2011-12-26: 6 mg via INTRAVENOUS
  Administered 2011-12-27: 25 mL via INTRAVENOUS
  Administered 2011-12-27: 3 mg via INTRAVENOUS
  Administered 2011-12-27: 4.5 mg via INTRAVENOUS
  Administered 2011-12-27: 1.5 mg via INTRAVENOUS
  Administered 2011-12-27: 10.5 mg via INTRAVENOUS
  Administered 2011-12-28: 2.4 mg via INTRAVENOUS
  Administered 2011-12-28: 4.5 mg via INTRAVENOUS
  Administered 2011-12-28: 10:00:00 via INTRAVENOUS
  Administered 2011-12-28 – 2011-12-29 (×3): 3 mg via INTRAVENOUS
  Administered 2011-12-29 (×2): 1 mg via INTRAVENOUS
  Administered 2011-12-29: 19:00:00 via INTRAVENOUS
  Administered 2011-12-29: 1.2 mg via INTRAVENOUS
  Administered 2011-12-29: 3 mg via INTRAVENOUS
  Administered 2011-12-29: 1.5 mg via INTRAVENOUS
  Administered 2011-12-30 (×2): 3 mg via INTRAVENOUS
  Administered 2011-12-30: 1.5 mg via INTRAVENOUS
  Administered 2011-12-30: 4.5 mg via INTRAVENOUS
  Filled 2011-12-25 (×4): qty 25

## 2011-12-25 SURGICAL SUPPLY — 75 items
APPLIER CLIP ROT 10 11.4 M/L (STAPLE)
APR CLP MED LRG 11.4X10 (STAPLE)
BLADE SURG 10 STRL SS (BLADE) ×2 IMPLANT
BLADE SURG ROTATE 9660 (MISCELLANEOUS) ×1 IMPLANT
CANISTER SUCTION 2500CC (MISCELLANEOUS) ×2 IMPLANT
CELLS DAT CNTRL 66122 CELL SVR (MISCELLANEOUS) ×1 IMPLANT
CHLORAPREP W/TINT 26ML (MISCELLANEOUS) ×2 IMPLANT
CLIP APPLIE ROT 10 11.4 M/L (STAPLE) IMPLANT
CLOTH BEACON ORANGE TIMEOUT ST (SAFETY) ×2 IMPLANT
COVER SURGICAL LIGHT HANDLE (MISCELLANEOUS) ×2 IMPLANT
DRAPE PROXIMA HALF (DRAPES) IMPLANT
DRAPE UTILITY 15X26 W/TAPE STR (DRAPE) ×4 IMPLANT
DRAPE WARM FLUID 44X44 (DRAPE) ×2 IMPLANT
ELECT CAUTERY BLADE 6.4 (BLADE) ×2 IMPLANT
ELECT REM PT RETURN 9FT ADLT (ELECTROSURGICAL) ×2
ELECTRODE REM PT RTRN 9FT ADLT (ELECTROSURGICAL) ×1 IMPLANT
GEL ULTRASOUND 20GR AQUASONIC (MISCELLANEOUS) IMPLANT
GLOVE BIOGEL PI IND STRL 7.0 (GLOVE) IMPLANT
GLOVE BIOGEL PI INDICATOR 7.0 (GLOVE) ×1
GLOVE ECLIPSE 6.5 STRL STRAW (GLOVE) ×1 IMPLANT
GLOVE SURG ORTHO 8.0 STRL STRW (GLOVE) ×2 IMPLANT
GLOVE SURG SIGNA 7.5 PF LTX (GLOVE) ×5 IMPLANT
GLOVE SURG SS PI 7.0 STRL IVOR (GLOVE) ×1 IMPLANT
GOWN STRL NON-REIN LRG LVL3 (GOWN DISPOSABLE) ×4 IMPLANT
GOWN STRL REIN XL XLG (GOWN DISPOSABLE) ×5 IMPLANT
KIT BASIN OR (CUSTOM PROCEDURE TRAY) ×2 IMPLANT
KIT ROOM TURNOVER OR (KITS) ×2 IMPLANT
LEGGING LITHOTOMY PAIR STRL (DRAPES) IMPLANT
LIGASURE IMPACT 36 18CM CVD LR (INSTRUMENTS) IMPLANT
NS IRRIG 1000ML POUR BTL (IV SOLUTION) ×8 IMPLANT
PAD ARMBOARD 7.5X6 YLW CONV (MISCELLANEOUS) ×4 IMPLANT
PENCIL BUTTON HOLSTER BLD 10FT (ELECTRODE) ×2 IMPLANT
RELOAD PROXIMATE 75MM BLUE (ENDOMECHANICALS) ×6 IMPLANT
RELOAD STAPLE 75 3.8 BLU REG (ENDOMECHANICALS) IMPLANT
RETRACTOR WND ALEXIS 18 MED (MISCELLANEOUS) IMPLANT
RTRCTR WOUND ALEXIS 18CM MED (MISCELLANEOUS) ×2
SCALPEL HARMONIC ACE (MISCELLANEOUS) ×1 IMPLANT
SCISSORS LAP 5X35 DISP (ENDOMECHANICALS) IMPLANT
SET IRRIG TUBING LAPAROSCOPIC (IRRIGATION / IRRIGATOR) ×2 IMPLANT
SLEEVE ENDOPATH XCEL 5M (ENDOMECHANICALS) ×5 IMPLANT
SPECIMEN JAR LARGE (MISCELLANEOUS) ×1 IMPLANT
SPONGE GAUZE 4X4 12PLY (GAUZE/BANDAGES/DRESSINGS) ×2 IMPLANT
SPONGE LAP 18X18 X RAY DECT (DISPOSABLE) ×2 IMPLANT
STAPLER PROXIMATE 75MM BLUE (STAPLE) ×1 IMPLANT
STAPLER VISISTAT 35W (STAPLE) ×2 IMPLANT
SURGILUBE 2OZ TUBE FLIPTOP (MISCELLANEOUS) IMPLANT
SUT PDS AB 1 CT  36 (SUTURE)
SUT PDS AB 1 CT 36 (SUTURE) IMPLANT
SUT PDS AB 1 CTX 36 (SUTURE) ×1 IMPLANT
SUT PROLENE 2 0 CT2 30 (SUTURE) IMPLANT
SUT PROLENE 2 0 KS (SUTURE) IMPLANT
SUT SILK 2 0 (SUTURE) ×2
SUT SILK 2 0 SH CR/8 (SUTURE) ×3 IMPLANT
SUT SILK 2-0 18XBRD TIE 12 (SUTURE) ×1 IMPLANT
SUT SILK 3 0 (SUTURE) ×2
SUT SILK 3 0 SH CR/8 (SUTURE) ×3 IMPLANT
SUT SILK 3-0 18XBRD TIE 12 (SUTURE) ×1 IMPLANT
SUT VIC AB 2-0 SH 27 (SUTURE) ×2
SUT VIC AB 2-0 SH 27XBRD (SUTURE) IMPLANT
SUT VICRYL AB 2 0 TIES (SUTURE) ×1 IMPLANT
SYS LAPSCP GELPORT 120MM (MISCELLANEOUS)
SYSTEM LAPSCP GELPORT 120MM (MISCELLANEOUS) IMPLANT
TAPE CLOTH SURG 4X10 WHT LF (GAUZE/BANDAGES/DRESSINGS) ×2 IMPLANT
TOWEL OR 17X24 6PK STRL BLUE (TOWEL DISPOSABLE) ×2 IMPLANT
TOWEL OR 17X26 10 PK STRL BLUE (TOWEL DISPOSABLE) ×2 IMPLANT
TRAY FOLEY CATH 14FRSI W/METER (CATHETERS) ×2 IMPLANT
TRAY LAPAROSCOPIC (CUSTOM PROCEDURE TRAY) ×2 IMPLANT
TRAY PROCTOSCOPIC FIBER OPTIC (SET/KITS/TRAYS/PACK) IMPLANT
TROCAR XCEL BLUNT TIP 100MML (ENDOMECHANICALS) IMPLANT
TROCAR XCEL NON-BLD 11X100MML (ENDOMECHANICALS) IMPLANT
TROCAR XCEL NON-BLD 5MMX100MML (ENDOMECHANICALS) ×2 IMPLANT
TUBE CONNECTING 12X1/4 (SUCTIONS) ×2 IMPLANT
TUBING FILTER THERMOFLATOR (ELECTROSURGICAL) ×2 IMPLANT
WATER STERILE IRR 1000ML POUR (IV SOLUTION) IMPLANT
YANKAUER SUCT BULB TIP NO VENT (SUCTIONS) ×2 IMPLANT

## 2011-12-25 NOTE — Consult Note (Addendum)
CARDIOLOGY CONSULT NOTE  Patient ID: [name] MRN: [id number] DOB/AGE: [birth year] 71 y.o.  Admit date: 12/24/2011 Referring Physician Laural Golden, MD Primary Physician: Juline Patch, MD Reason for Consultation Manage valvular heart disease  HPI:  [name] in [place] is a 71 year old Caucasian male with history of known coronary artery disease.  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  He also has mild leg stenosis and aortic regurgitation.  He has had a proximal LAD stent (4.0 x 26 mm non-drug-eluting integrity stent 11/11/2010.  He had been doing well until recently he has had sepsis, with pyrexia  of unknown origin which when she led to the diagnosis of a colonic mass when he was growing Streptococcus bovis.  He is admitted to the hospital and underwent elective partial colectomy.  Due to his complex and cardiovascular issues, I have been consulted to on the management postoperatively. Patient is postop and is doing well and except for abdominal discomfort no other complaints.  He had good urinary output for nursing.  He denies any chest pain, shortness of breath.   Past Medical History  Diagnosis Date  . Heart murmur   . Coronary artery disease   . Endocarditis   . Arthritis   . Hypertension   . GERD (gastroesophageal reflux disease)   . Myocardial infarction     POSSIBLE 2012 ELEVATED ENZYMES  . High cholesterol   . Pneumonia ~ 2011  . Migraines     "none in the last couple years" (12/24/2011)  . Chronic lower back pain     "last 3 months; usually when I bend" (12/24/2011)     Past Surgical History  Procedure Date  . Tee without cardioversion 11/06/2011    Procedure: TRANSESOPHAGEAL ECHOCARDIOGRAM (TEE);  Surgeon: [name], MD;  Location: [facility] ENDOSCOPY;  Service: Cardiovascular;  Laterality: N/A;  . Colonoscopy 11/11/2011    Procedure:  COLONOSCOPY;  Surgeon: Barrie Folk, MD;  Location: [facility] ENDOSCOPY;  Service: Endoscopy;  Laterality: N/A;  . Coronary angioplasty with stent placement 11/11/2010    "1"  . Cystectomy ~ 1980    right eyelid     History reviewed. No pertinent family history.  Social History: History   Social History  . Marital Status: Married    Spouse Name: N/A    Number of Children: N/A  . Years of Education: N/A   Occupational History  . Not on file.   Social History Main Topics  . Smoking status: Never Smoker   . Smokeless tobacco: Never Used   Comment: 12/24/2011 "used to puff cigarettes; never inhaled"  . Alcohol Use: 0.6 oz/week    1 Cans of beer per week  . Drug Use: No  . Sexually Active: No   Other Topics Concern  . Not on file   Social History Narrative  . No narrative on file     Prescriptions prior to admission  Medication Sig Dispense Refill  . Arginine 500 MG CAPS Take 500 mg by mouth 2 (two) times daily.      . Ascorbic Acid (VITAMIN C) 500 MG tablet Take 500 mg by mouth 2 (two) times daily.        Marland Kitchen aspirin 81 MG tablet Take 81 mg by mouth daily.      . cholecalciferol (VITAMIN D) 1000 UNITS tablet Take 2,000 Units by mouth daily.       Marland Kitchen CINNAMON PO Take  500 mg by mouth 2 (two) times daily.      . Coenzyme Q10 (EQL COQ10) 300 MG CAPS Take 1 capsule by mouth daily.        . Ginkgo Biloba 60 MG CAPS Take 1 capsule by mouth daily.        Marland Kitchen glucosamine-chondroitin 500-400 MG tablet Take 2 tablets by mouth 2 (two) times daily.      Marland Kitchen HAWTHORN BERRY PO Take 1 capsule by mouth daily.       . Horse Chestnut 300 MG CAPS Take 1 capsule by mouth daily.        Marland Kitchen Hyaluronic Acid-Vitamin C (HYALURONIC ACID PO) Take 40 mg by mouth daily.      Boris Lown Oil 300 MG CAPS Take 300 mg by mouth daily.      . metoprolol succinate (TOPROL-XL) 25 MG 24 hr tablet Take 25 mg by mouth daily.      . Multiple Vitamin (MULTIVITAMIN WITH MINERALS) TABS Take 1 tablet by mouth daily.      Marland Kitchen omeprazole  (PRILOSEC) 10 MG capsule Take 10 mg by mouth daily as needed. For upset stomach      . OVER THE COUNTER MEDICATION Take 1,000 mg by mouth daily. BILLBERRY CAPSULE 1000MG       . pravastatin (PRAVACHOL) 40 MG tablet Take 40 mg by mouth daily.      . Probiotic Product (PROBIOTIC DAILY PO) Take by mouth. Digestive care      . saw palmetto 160 MG capsule Take 160 mg by mouth.      . vitamin A 8000 UNIT capsule Take 8,000 Units by mouth daily.      . vitamin B-12 (CYANOCOBALAMIN) 1000 MCG tablet Take 1,000 mcg by mouth daily.      . vitamin E 400 UNIT capsule Take 400 Units by mouth 2 (two) times daily.      Marland Kitchen VITAMIN K PO Take 45 mcg by mouth daily. Vit k2        ROS: General: no fevers/chills/night sweats Eyes: no blurry vision, diplopia, or amaurosis ENT: no sore throat or hearing loss Resp: no cough, wheezing, or hemoptysis CV: no edema or palpitations GI: Post op abdominal pain present. GU: no dysuria, frequency, or hematuria Skin: no rash Neuro: no headache, numbness, tingling, or weakness of extremities Musculoskeletal: no joint pain or swelling Heme: no bleeding, DVT, or easy bruising Endo: no polydipsia or polyuria    Physical Exam: Blood pressure 92/54, pulse 91, temperature 97.7 F (36.5 C), temperature source Oral, resp. rate 14, height 6' (1.829 m), weight 85.276 kg (188 lb), SpO2 95.00%.   Well built and normal bodily habitus, in no acute distress.   Appears stated age.   Alert Ox3 and in no acute distress.     There is no cyanosis.   HEENT: normal limits.   Pectus excavatum present.     CARDIAC EXAM: S1 S2 normal, no gallop, III/VI SEM noted in apex and right sternal border.   Murmur decreases in intensity with sqatting.     CHEST EXAM: No tenderness of chest wall.   Pectus excavatum noted.   LUNGS: Clear to percuss and auscultate.   No rales or ronchi.     ABDOMEN Post op dressing noted, mildly distended, bowel sounds absent    EXTREMITY: No edema.   Normal pulses.    MUSCULOSKELETAL EXAM: Intact with full range of motion in all 4 extremities.     NEUROLOGIC EXAM: Grossly intact without any  focal deficits.   Alert O x 3.     VASCULAR EXAM: No skin breakdown.   Carotids faint conducted murmur present as bruit.   Extremities: Femoral pulse normal.   Otherwise no bruit, all pulses intact.   No prominent pulse felt in the abdomen.   No varicose veins.  Labs:   Lab Results  Component Value Date   WBC 4.6 12/17/2011   HGB 11.3* 12/17/2011   HCT 34.3* 12/17/2011   MCV 88.9 12/17/2011   PLT 132* 12/17/2011   Lipid Panel     Component Value Date/Time   CHOL  Value: 136        ATP III CLASSIFICATION:  <200     mg/dL   Desirable  045-409  mg/dL   Borderline High  >=811    mg/dL   High        12/19/7827 1342   TRIG 44 04/15/2010 1342   HDL 37* 04/15/2010 1342   CHOLHDL 3.7 04/15/2010 1342   VLDL 9 04/15/2010 1342   LDLCALC  Value: 90        Total Cholesterol/HDL:CHD Risk Coronary Heart Disease Risk Table                     Men   Women  1/2 Average Risk   3.4   3.3  Average Risk       5.0   4.4  2 X Average Risk   9.6   7.1  3 X Average Risk  23.4   11.0        Use the calculated Patient Ratio above and the CHD Risk Table to determine the patient's CHD Risk.        ATP III CLASSIFICATION (LDL):  <100     mg/dL   Optimal  562-130  mg/dL   Near or Above                    Optimal  130-159  mg/dL   Borderline  865-784  mg/dL   High  >696     mg/dL   Very High 2/95/2841 3244     EKG:12/17/2011: NSR @ 80/min. LAD, LAFB, LVH with repolarization abnormality, cannot r/o lateral ischemia.   ASSESSMENT AND PLAN:  1. 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 2. Mild aortic stenosis and regurgitation 3. CAD: H/O  proximal LAD stent (4.0 x 26 mm non-drug-eluting integrity stent 11/11/2010.  4. Chronic thrombocytopenia, mild and asymptomatic.  Recommendation: At this point the most important aspect for therapy would be aggressive hydration to avoid dehydration.  He has  hyperdynamic left ventricle by echocardiogram.  His EKG has remained unchanged from prior EKGs in the office and in the hospital.  I have recommended changing D5.45 saline IV fluid to D5 0.9 normal saline IV fluid at 150CC an hour and to bolus the patient if the blood pressure were to fall below 90 mmHg systolic.  Patient chronically runs low blood pressure at around 90 mmHg.otherwise he is on appropriate therapy.  Until he resumes oral intake, recommend IV fluid at 150 cc for now. Consider switching him to linger lactate if electrolyte abnormality.  [name], MD 12/25/2011, 9:28 PM

## 2011-12-25 NOTE — Op Note (Signed)
NAMECLAUDIO, MONDRY NO.:  [id number]  MEDICAL RECORD NO.:  [id number]  LOCATION:  6N07C                        FACILITY:  [facility]  PHYSICIAN:  Sandria Bales. Ezzard Standing, M.D.  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  POSTOPERATIVE DIAGNOSES:  Right colon tumor, bilateral inguinal hernias (right direct inguinal hernia, left indirect and indirect inguinal hernia).  PROCEDURE:  Laparoscopic-assisted right hemicolectomy.  SURGEON:  Sandria Bales. Ezzard Standing, MD  FIRST ASSISTANT:  Velora Heckler, MD  ANESTHESIA:  General.  ESTIMATED BLOOD LOSS:  100 mL.  Drains left in were none, 30 mL of local.  INDICATION FOR PROCEDURE:  Mr. Bunte is a 71 year old white male who sees Dr. Juline Patch as his primary care doctor.  He has presented with bacterial endocarditis and which at least one of his cultures showed Streptococcal bovis.  Because of this, he underwent a colonic evaluation, was found to have a right colon mass by Dr. Dorena Cookey, not amenable to colonic resection.  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  I discussed with the patient about proceeding with right hemicolectomy. I discussed the indications, potential complications of surgery.  Potential complications include, but are not limited to, bleeding, infection, bowel leak, nerve injury, as this is malignant, further treatment.  Because the patient's underlying hypertrophic cardiomyopathy, admitted the night before for IV hydration and asked Dr. Jacinto Halim to see in consultation.  OPERATIVE NOTE:  The patient was taken to room #1 with general anesthesia, supervised by Dr. Diamantina Monks.  He had 2 g of cefoxitin initially during the procedure.  He had PAS stockings and Foley catheter in place OG.  His abdomen was prepped with ChloraPrep and sterilely draped.  A time-out was held and  surgical checklist run.  I started with a laparoscopic examination.  I accessed the abdominal cavity with a 5 mm Optiview trocar in the left upper quadrant.  I then placed 3 additional 5 mm trocars: one in left mid abdomen, one in the left lower quadrant and one in the right upper quadrant.  I think it was freely mobile though he had tethered small bowel over the pelvic brim that I had to mobilize to get up.  Then once I got this mobilized, I started at the peritoneal reflection along the pelvic brim and got in the avascular plane between the cecum anteriorly and the retroperitoneum posterior.  The right ureter was left behind the dissection.  I took the dissection up to the splenic flexure.  I identified the duodenum Medially, underneath the right colon.  I then switched my dissection towards the right upper quadrant.  He did have omental adhesions up over his gallbladder and the right lobe of the liver.  I took these down with a Harmonic Scalpel.  I was able to mobilize the right colon down.  I identified the duodenum medially.  At this point I thought I had adequate mobilization of the right colon.  I have made a 7 cm incision midline in the supraumbilical area.  I was able to eviscerate  the right colon.  I divided the distal small bowel with a 75 mm GIA stapler (Ethicon) about 10 cm of the terminal ileum because some of the mesentery of the terminal ileum was beat up.  He had a palpable tumor which was maybe 3-4 cm in size in the cecum and I divided the right colon at the hepatic flexure with a  75 mm GIA stapler (Ethicon). I used a Harmonic scalpel for some of the mesentery, but the main right ileocolic vessel, I double ligated with a 2-0 silk suture.  The colon was then sent for pathology.  I then closed the mesentery defect with a running 2-0 Vicryl suture.  I then did a side-to-side stapled anastomosis between the terminal ileum and the right transverse colon with a 75 GIA stapler.  This made at  least a 4 cm anastomosis.  I closed the enterotomy with interrupted 3-0 silk sutures.  I reinforced the suture line with 3-0 silk sutures and then dropped this back into the abdominal cavity.  I then irrigated his abdomen with 4-5 L of saline.  I reinspected the right colonic gutter and saw no bleeding.  I looked at the omentum to be taken down.  I saw no bleeding and again irrigated out the belly.  I then closed the midline incision with 2 running #1 PDS sutures.  I then laparoscopically re-examined the peritoneal cavity.  I infiltrated the midline incision with 30 mL of 0.25% Marcaine.  I irrigated the subcutaneous tissues with saline and then closed the skin with staples.  I removed the trocars and turned and closed those with staples.    Before closing, I did a laparoscopic exploration.  Right and left lobes of liver were unremarkable with no evidence of any cancer or metastatic disease.  Anterior wall of the stomach was unremarkable.  He does have bilateral inguinal hernias that I diagnosed preoperatively on physical exam.  It appears that the left inguinal hernias both direct and indirect component, the right hernia is just a direct component only.  Photos were taken and then this was placed in the chart.  There was no other intra-abdominal abnormality.  The patient was transferred to the recovery room in good condition.  The sponge and needle count were correct. Because of his known cardiac disease, I am going to plan to admit him to the step-down unit for at least overnight and go from there.   Sandria Bales. Ezzard Standing, M.D., FACS   DHN/MEDQ  D:  12/25/2011  T:  12/25/2011  Job:  [id number]  cc:   [name], MD Everardo All Madilyn Fireman, M.D. Juline Patch, M.D. Acey Lav, MD

## 2011-12-25 NOTE — Anesthesia Preprocedure Evaluation (Addendum)
Anesthesia Evaluation  Patient identified by MRN, date of birth, ID band Patient awake    Reviewed: Allergy & Precautions, H&P , NPO status , Patient's Chart, lab work & pertinent test results, reviewed documented beta blocker date and time   Airway Mallampati: I TM Distance: >3 FB Neck ROM: full    Dental  (+) Teeth Intact, Caps and Dental Advisory Given   Pulmonary shortness of breath,          Cardiovascular hypertension, Pt. on medications and Pt. on home beta blockers + CAD, + Past MI and + Cardiac Stents + Valvular Problems/Murmurs AS Rhythm:regular Rate:Normal  Hx subaortic stenosis, cardiomyopathy, endocarditis, etc.   Neuro/Psych  Headaches,    GI/Hepatic GERD-  Controlled and Medicated,  Endo/Other    Renal/GU Renal InsufficiencyRenal diseaseHx of...     Musculoskeletal   Abdominal   Peds  Hematology   Anesthesia Other Findings Expensive bridgework across top front...  Reproductive/Obstetrics                         Anesthesia Physical Anesthesia Plan  ASA: III  Anesthesia Plan: General   Post-op Pain Management:    Induction: Intravenous  Airway Management Planned: Oral ETT  Additional Equipment:   Intra-op Plan:   Post-operative Plan: Extubation in OR  Informed Consent: I have reviewed the patients History and Physical, chart, labs and discussed the procedure including the risks, benefits and alternatives for the proposed anesthesia with the patient or authorized representative who has indicated his/her understanding and acceptance.     Plan Discussed with: CRNA, Anesthesiologist and Surgeon  Anesthesia Plan Comments:         Anesthesia Quick Evaluation

## 2011-12-25 NOTE — Progress Notes (Signed)
NOS  Patient in 2900.  He's been here about one hour, but the PCA machine has not arrived yet.  I am not sure that I understand this.  Anyway, he looks good.    VSS. Dressing is dry.  Nursing needs to get IS at bedside.  Labs ordered in the AM.  [name], MD, [facility] Surgery Pager: [phone] Office phone:  [phone]

## 2011-12-25 NOTE — Brief Op Note (Signed)
12/24/2011 - 12/25/2011  12:24 PM  PATIENT:  [name], 71 y.o., male, MRN: [id number]  PREOP DIAGNOSIS:  colon mass  POSTOP DIAGNOSIS:   Right colon tumor, bilateral inguinal hernias (right direct, left indirect and direct)  PROCEDURE:   Procedure(s): Right COLON RESECTION, LAPAROSCOPIC assisted  SURGEON:   [name], M.D.  ASSISTANTNat Christen, M.D.  ANESTHESIA:   general  Marni Griffon, CRNA - CRNA Rivka Barbara, MD - Anesthesiologist  General  EBL:  100  ml  BLOOD ADMINISTERED: none  DRAINS: none   LOCAL MEDICATIONS USED:   30 cc 1/4% marcaine.  SPECIMEN:   Right colon  COUNTS CORRECT:  YES  INDICATIONS FOR PROCEDURE:  ALTON BOUKNIGHT is a 71 y.o. (DOB: [birth year]) white male whose primary care physician is PANG,RICHARD, MD and comes for right colectomy for right colon tumor.   The indications and risks of the surgery were explained to the patient.  The risks include, but are not limited to, infection, bleeding, and nerve injury.  Note dictated to:   #[id number]

## 2011-12-25 NOTE — Preoperative (Signed)
Beta Blockers   Reason not to administer Beta Blockers: Skipped Toprol times two days due to bowel prep;  Will address intraop.

## 2011-12-25 NOTE — Transfer of Care (Signed)
Immediate Anesthesia Transfer of Care Note  Patient: [name]  Procedure(s) Performed: Procedure(s) (LRB) with comments: COLON RESECTION LAPAROSCOPIC (Right) - laparoscopic assisted right colon resection  Patient Location: PACU  Anesthesia Type: General  Level of Consciousness: awake, alert , oriented and patient cooperative  Airway & Oxygen Therapy: Patient Spontanous Breathing and Patient connected to nasal cannula oxygen  Post-op Assessment: Report given to PACU RN, Post -op Vital signs reviewed and stable and Patient moving all extremities  Post vital signs: Reviewed and stable  Complications: No apparent anesthesia complications

## 2011-12-26 LAB — BASIC METABOLIC PANEL
BUN: 7 mg/dL (ref 6–23)
Chloride: 107 mEq/L (ref 96–112)
Glucose, Bld: 141 mg/dL — ABNORMAL HIGH (ref 70–99)
Potassium: 4.2 mEq/L (ref 3.5–5.1)

## 2011-12-26 LAB — CBC
HCT: 29.6 % — ABNORMAL LOW (ref 39.0–52.0)
Hemoglobin: 9.6 g/dL — ABNORMAL LOW (ref 13.0–17.0)
WBC: 7.9 10*3/uL (ref 4.0–10.5)

## 2011-12-26 MED ORDER — METOPROLOL TARTRATE 1 MG/ML IV SOLN
2.5000 mg | INTRAVENOUS | Status: DC
  Administered 2011-12-26: 21:00:00 via INTRAVENOUS
  Administered 2011-12-26 – 2011-12-28 (×10): 2.5 mg via INTRAVENOUS
  Filled 2011-12-26 (×12): qty 5

## 2011-12-26 MED ORDER — LACTATED RINGERS IV BOLUS (SEPSIS)
500.0000 mL | Freq: Once | INTRAVENOUS | Status: AC
  Administered 2011-12-26: 500 mL via INTRAVENOUS

## 2011-12-26 NOTE — Progress Notes (Signed)
Subjective:  Patient feels well except for abdominal discomfort. He has been doing well and has been sitting up on the chair for greater than 6-8 hours today. He has had excellent urine output through the night. His blood pressures have been soft but otherwise patient is asymptomatic. No chest pain or dyspnea. No fever spike.  Objective:  Vital Signs in the last 24 hours: Temp:  [97.3 F (36.3 C)-98.6 F (37 C)] 98.3 F (36.8 C) (09/21 1617) Resp:  [9-20] 17  (09/21 1815) BP: (68-110)/(45-63) 95/51 mmHg (09/21 1815) SpO2:  [93 %-100 %] 98 % (09/21 1617)  Intake/Output from previous day: 09/20 0701 - 09/21 0700 In: 6095 [I.V.:5845; IV Piggyback:250] Out: 820 [Urine:730; Blood:90]  Physical Exam:  Well built and normal bodily habitus, in no acute distress. Appears stated age. Alert Ox3 and in no acute distress.  There is no cyanosis. HEENT: normal limits. Pectus excavatum present.  CARDIAC EXAM: S1 S2 normal, no gallop, III/VI SEM noted in apex and right sternal border. Murmur decreases in intensity with sqatting.  CHEST EXAM: No tenderness of chest wall. Pectus excavatum noted. LUNGS: Clear to percuss and auscultate. No rales or ronchi.  ABDOMEN Post op dressing noted, mildly distended, bowel sounds absent  EXTREMITY: No edema. Normal pulses. MUSCULOSKELETAL EXAM: Intact with full range of motion in all 4 extremities.  NEUROLOGIC EXAM: Grossly intact without any focal deficits. Alert O x 3.  VASCULAR EXAM: No skin breakdown. Carotids faint conducted murmur present as bruit. Extremities: Femoral pulse normal. Otherwise no bruit, all pulses intact. No prominent pulse felt in the abdomen. No varicose veins.    Lab Results:  Basename 12/26/11 0500  WBC 7.9  HGB 9.6*  PLT 120*    Basename 12/26/11 0500  NA 139  K 4.2  CL 107  CO2 24  GLUCOSE 141*  BUN 7  CREATININE 0.89    Assessment/Plan:  1. 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  2. Mild aortic stenosis  and regurgitation  3. CAD: H/O proximal LAD stent (4.0 x 26 mm non-drug-eluting integrity stent 11/11/2010.  4. Chronic thrombocytopenia, mild and asymptomatic. 5. Post operative ilius. Patient has not passed gas and BS are absent.  Recommendation: Although he has been given by mouth medications, I would prefer him to be on low-dose of beta blocker on a more frequent basis to reduce his heart rate. His blood pressure baseline is between 85-90 mm systolic. He has no clinical evidence of congestive heart failure in spite of aggressive fluid resuscitation. He remains asymptomatic with good urinary output. I will continue the same. Unless issues, I have discussed with the nurse, I will see the patient on Monday. Until then we will continue IV beta blocker therapy.   [name], M.D. 12/26/2011, 6:38 PM

## 2011-12-26 NOTE — Progress Notes (Signed)
1 Day Post-Op  Subjective: Pain controlled with PCA. Had some hypotension overnight intermittently.    Objective: Vital signs in last 24 hours: Temp:  [97.2 F (36.2 C)-98.6 F (37 C)] 98.3 F (36.8 C) (09/21 1103) Pulse Rate:  [62-91] 91  (09/20 1707) Resp:  [9-20] 20  (09/21 1105) BP: (77-114)/(50-63) 94/63 mmHg (09/21 1105) SpO2:  [93 %-100 %] 95 % (09/21 0700) Last BM Date: 12/24/11  Intake/Output from previous day: 09/20 0701 - 09/21 0700 In: 6095 [I.V.:5845; IV Piggyback:250] Out: 820 [Urine:730; Blood:90] Intake/Output this shift:    General appearance: alert, cooperative and no distress Resp: no respiratory distress GI:  Abd soft, approp tender.  No staining on dressing.  Non distended.   Ext:  Warm.  No edema.   Lab Results:   Basename 12/26/11 0500  WBC 7.9  HGB 9.6*  HCT 29.6*  PLT 120*   BMET  Basename 12/26/11 0500  NA 139  K 4.2  CL 107  CO2 24  GLUCOSE 141*  BUN 7  CREATININE 0.89  CALCIUM 8.2*   PT/INR No results found for this basename: LABPROT:2,INR:2 in the last 72 hours ABG No results found for this basename: PHART:2,PCO2:2,PO2:2,HCO3:2 in the last 72 hours  Studies/Results: No results found.  Anti-infectives: Anti-infectives     Start     Dose/Rate Route Frequency Ordered Stop   12/25/11 1815   cefOXitin (MEFOXIN) 1 g in dextrose 5 % 50 mL IVPB        1 g 100 mL/hr over 30 Minutes Intravenous 3 times per day 12/25/11 1738 12/26/11 0252   12/24/11 1500   neomycin (MYCIFRADIN) tablet 1,000 mg        1,000 mg Oral Every  3 hours 12/24/11 1137 12/24/11 2308   12/24/11 1219   cefOXitin (MEFOXIN) 2 g in dextrose 5 % 50 mL IVPB        2 g 100 mL/hr over 30 Minutes Intravenous 60 min pre-op 12/24/11 1220 12/25/11 0955   12/24/11 1200   erythromycin (E-MYCIN) tablet 1,000 mg        1,000 mg Oral Every  3 hours 12/24/11 1132 12/25/11 0000          Assessment/Plan: s/p Procedure(s) (LRB) with comments: COLON RESECTION  LAPAROSCOPIC (Right) - laparoscopic assisted right colon resection Await return of bowel function.   Aortic stenosis - very preload dependent.  May need additional bolus for hypotension.  Cards following.    LOS: 2 days    [facility] 12/26/2011

## 2011-12-27 LAB — CBC
HCT: 29.2 % — ABNORMAL LOW (ref 39.0–52.0)
MCH: 29.5 pg (ref 26.0–34.0)
MCV: 89.8 fL (ref 78.0–100.0)
Platelets: 99 10*3/uL — ABNORMAL LOW (ref 150–400)
RDW: 14.3 % (ref 11.5–15.5)
WBC: 7.7 10*3/uL (ref 4.0–10.5)

## 2011-12-27 LAB — BASIC METABOLIC PANEL
BUN: 5 mg/dL — ABNORMAL LOW (ref 6–23)
Calcium: 8.2 mg/dL — ABNORMAL LOW (ref 8.4–10.5)
Chloride: 104 mEq/L (ref 96–112)
Creatinine, Ser: 0.89 mg/dL (ref 0.50–1.35)
GFR calc Af Amer: >90 mL/min (ref >90)

## 2011-12-27 MED ORDER — POTASSIUM CHLORIDE 10 MEQ/100ML IV SOLN
10.0000 meq | INTRAVENOUS | Status: AC
  Administered 2011-12-27 (×2): 10 meq via INTRAVENOUS
  Filled 2011-12-27: qty 200

## 2011-12-27 NOTE — Progress Notes (Signed)
2 Days Post-Op  Subjective: NO CP OR SOB.  NO FLATUS OR BM NO VOMITING OR NAUSEA  Objective: Vital signs in last 24 hours: Temp:  [98.2 F (36.8 C)-99.5 F (37.5 C)] 99.5 F (37.5 C) (09/22 0700) Pulse Rate:  [90-91] 91  (09/22 0415) Resp:  [9-22] 16  (09/22 0415) BP: (68-112)/(45-63) 101/59 mmHg (09/22 0415) SpO2:  [93 %-98 %] 93 % (09/22 0400) FiO2 (%):  [100 %] 100 % (09/22 0400) Last BM Date: 12/24/11  Intake/Output from previous day: 09/21 0701 - 09/22 0700 In: 3450 [I.V.:3450] Out: 1500 [Urine:1500] Intake/Output this shift:    Incision/Wound:SOFT MIN DISTENSION QUIET  DRESSINGS DRY  Lab Results:   Basename 12/27/11 0550 12/26/11 0500  WBC 7.7 7.9  HGB 9.6* 9.6*  HCT 29.2* 29.6*  PLT 99* 120*   BMET  Basename 12/27/11 0550 12/26/11 0500  NA 137 139  K 3.3* 4.2  CL 104 107  CO2 27 24  GLUCOSE 133* 141*  BUN 5* 7  CREATININE 0.89 0.89  CALCIUM 8.2* 8.2*   PT/INR No results found for this basename: LABPROT:2,INR:2 in the last 72 hours ABG No results found for this basename: PHART:2,PCO2:2,PO2:2,HCO3:2 in the last 72 hours  Studies/Results: No results found.  Anti-infectives: Anti-infectives     Start     Dose/Rate Route Frequency Ordered Stop   12/25/11 1815   cefOXitin (MEFOXIN) 1 g in dextrose 5 % 50 mL IVPB        1 g 100 mL/hr over 30 Minutes Intravenous 3 times per day 12/25/11 1738 12/26/11 0252   12/24/11 1500   neomycin (MYCIFRADIN) tablet 1,000 mg        1,000 mg Oral Every  3 hours 12/24/11 1137 12/24/11 2308   12/24/11 1219   cefOXitin (MEFOXIN) 2 g in dextrose 5 % 50 mL IVPB        2 g 100 mL/hr over 30 Minutes Intravenous 60 min pre-op 12/24/11 1220 12/25/11 0955   12/24/11 1200   erythromycin (E-MYCIN) tablet 1,000 mg        1,000 mg Oral Every  3 hours 12/24/11 1132 12/25/11 0000          Assessment/Plan: s/p Procedure(s) (LRB) with comments: COLON RESECTION LAPAROSCOPIC (Right) - laparoscopic assisted right colon  resection Await return of bowel function  Try clears No signs of heart failure at this point.  Volume dependent hypotension but better today.  Try to reduce IVF.  KEEP FOLEY FOR STRICT I AND O MONITORING OOB AMBULATE  CONT PCA RRPLACE K  LOS: 3 days    [name] A. 12/27/2011

## 2011-12-28 LAB — CBC
HCT: 29.6 % — ABNORMAL LOW (ref 39.0–52.0)
MCHC: 33.4 g/dL (ref 30.0–36.0)
MCV: 87.8 fL (ref 78.0–100.0)
Platelets: 102 10*3/uL — ABNORMAL LOW (ref 150–400)
RDW: 14 % (ref 11.5–15.5)

## 2011-12-28 LAB — BASIC METABOLIC PANEL
BUN: 4 mg/dL — ABNORMAL LOW (ref 6–23)
Creatinine, Ser: 0.82 mg/dL (ref 0.50–1.35)
GFR calc Af Amer: >90 mL/min (ref >90)
GFR calc non Af Amer: 88 mL/min — ABNORMAL LOW (ref >90)
Potassium: 3.2 mEq/L — ABNORMAL LOW (ref 3.5–5.1)

## 2011-12-28 MED ORDER — METOPROLOL TARTRATE 12.5 MG HALF TABLET: 12.5000 mg | ORAL_TABLET | Freq: Four times a day (QID) | ORAL | Status: DC

## 2011-12-28 MED ORDER — POTASSIUM CHLORIDE 10 MEQ/100ML IV SOLN
10.0000 meq | INTRAVENOUS | Status: AC
  Administered 2011-12-28 (×3): 10 meq via INTRAVENOUS
  Filled 2011-12-28 (×3): qty 100

## 2011-12-28 MED ORDER — KCL IN DEXTROSE-NACL 20-5-0.45 MEQ/L-%-% IV SOLN
INTRAVENOUS | Status: DC
  Administered 2011-12-28: 100 mL/h via INTRAVENOUS
  Administered 2011-12-28 – 2011-12-29 (×2): via INTRAVENOUS
  Administered 2011-12-29: 1000 mL via INTRAVENOUS
  Administered 2011-12-30 (×2): via INTRAVENOUS
  Filled 2011-12-28 (×8): qty 1000

## 2011-12-28 MED ORDER — METOPROLOL TARTRATE 1 MG/ML IV SOLN
2.5000 mg | Freq: Four times a day (QID) | INTRAVENOUS | Status: DC
  Administered 2011-12-28 – 2011-12-30 (×5): 2.5 mg via INTRAVENOUS
  Filled 2011-12-28 (×12): qty 5

## 2011-12-28 NOTE — Anesthesia Postprocedure Evaluation (Signed)
  Anesthesia Post-op Note  Patient: [name]  Procedure(s) Performed: Procedure(s) (LRB) with comments: COLON RESECTION LAPAROSCOPIC (Right) - laparoscopic assisted right colon resection  Patient Location: ICU  Anesthesia Type: General  Level of Consciousness: awake, alert , oriented and patient cooperative  Airway and Oxygen Therapy: Patient Spontanous Breathing and Patient connected to nasal cannula oxygen  Post-op Pain: 2 /10, mild  Post-op Assessment: Post-op Vital signs reviewed, Patient's Cardiovascular Status Stable, Respiratory Function Stable, Patent Airway and No signs of Nausea or vomiting  Post-op Vital Signs: Reviewed and stable  Complications: No apparent anesthesia complications

## 2011-12-28 NOTE — Progress Notes (Signed)
General Surgery Note  LOS: 4 days  POD# 3  Assessment/Plan:  1.  COLON RESECTION LAPAROSCOPIC - D. Tyarra Nolton - 12/25/2011  Still with ileus.  To allow sips, but nothing else for now.  Continue to encourage ambulation.   2. Strep Bovis endocarditis. Treated   Followed by Dr. Daiva Eves  3. HYPERTROPHIC OBSTRUCTIVE CARDIOMYOPATHY   Followed by Dr. Jacinto Halim in the hospital. 4. HTN (hypertension)  5. BPH (benign prostatic hyperplasia)  6. Subaortic stenosis  7. Back pain  8. Hyperlipidemia  9. On Plavix, on hold. 10. Bilateral inguinal hernias. Right mildly symptomatic.   We will address these at a later date. 12.  Hypokalemia - to replace 13.  Foley - will try this out  Subjective:  Doing well. A little less pain.  No flatus or BM. Objective:   Filed Vitals:   12/28/11 0600  BP: 122/55  Pulse:   Temp:   Resp: 16     Intake/Output from previous day:  09/22 0701 - 09/23 0700 In: 2400 [I.V.:2400] Out: 3850 [Urine:3850]  Intake/Output this shift:      Physical Exam:   General: WN WM who is alert and oriented.    HEENT: Normal. Pupils equal. .   Lungs: clear   Abdomen: Soft, with rare BS.   Wound: Clean.   Neurologic:  Grossly intact to motor and sensory function.   Psychiatric: Has normal mood and affect.   Lab Results:    Basename 12/28/11 0040 12/27/11 0550  WBC 6.7 7.7  HGB 9.9* 9.6*  HCT 29.6* 29.2*  PLT 102* 99*    BMET   Basename 12/28/11 0040 12/27/11 0550  NA 136 137  K 3.2* 3.3*  CL 101 104  CO2 27 27  GLUCOSE 116* 133*  BUN 4* 5*  CREATININE 0.82 0.89  CALCIUM 8.6 8.2*    PT/INR  No results found for this basename: LABPROT:2,INR:2 in the last 72 hours  ABG  No results found for this basename: PHART:2,PCO2:2,PO2:2,HCO3:2 in the last 72 hours   Studies/Results:  No results found.   Anti-infectives:   Anti-infectives     Start     Dose/Rate Route Frequency Ordered Stop   12/25/11 1815   cefOXitin (MEFOXIN) 1 g in dextrose 5 % 50 mL IVPB        1 g 100 mL/hr over 30 Minutes Intravenous 3 times per day 12/25/11 1738 12/26/11 0252   12/24/11 1500   neomycin (MYCIFRADIN) tablet 1,000 mg        1,000 mg Oral Every  3 hours 12/24/11 1137 12/24/11 2308   12/24/11 1219   cefOXitin (MEFOXIN) 2 g in dextrose 5 % 50 mL IVPB        2 g 100 mL/hr over 30 Minutes Intravenous 60 min pre-op 12/24/11 1220 12/25/11 0955   12/24/11 1200   erythromycin (E-MYCIN) tablet 1,000 mg        1,000 mg Oral Every  3 hours 12/24/11 1132 12/25/11 0000          [name], MD, FACS Pager: [phone],   [facility] [place] [facility] Office: [phone] 12/28/2011

## 2011-12-28 NOTE — Care Management Note (Signed)
    Page 1 of 1   12/28/2011     11:14:39 AM   CARE MANAGEMENT NOTE 12/28/2011  Patient:  [name], [name]   Account Number:  [id number]  Date Initiated:  12/28/2011  Documentation initiated by:  Junius Creamer  Subjective/Objective Assessment:   adm for colectomy, hx cardiac dis     Action/Plan:   lives w wife, pcp dr Gerlene Burdock pang   Anticipated DC Date:     Anticipated DC Plan:        DC Planning Services  CM consult      Choice offered to / List presented to:             Status of service:   Medicare Important Message given?   (If response is "NO", the following Medicare IM given date fields will be blank) Date Medicare IM given:   Date Additional Medicare IM given:    Discharge Disposition:    Per UR Regulation:  Reviewed for med. necessity/level of care/duration of stay  If discussed at Long Length of Stay Meetings, dates discussed:    Comments:  9/23 11:10am debbie Davon Folta rn,bsn [phone]

## 2011-12-28 NOTE — Progress Notes (Addendum)
Subjective:  Patient feels well except for abdominal discomfort. He has been doing well and has been sitting up on the chair for greater than 6-8 hours today. He has had excellent urine output.  His blood pressures have been stable and patient is asymptomatic, has not passed flatus.  No chest pain or dyspnea. No fever spike.  Objective:  Vital Signs in the last 24 hours: Temp:  [97.9 F (36.6 C)-98.7 F (37.1 C)] 97.9 F (36.6 C) (09/23 2117) Pulse Rate:  [100] 100  (09/23 2117) Resp:  [15-20] 15  (09/23 2117) BP: (101-140)/(55-90) 137/71 mmHg (09/23 2117) SpO2:  [95 %-100 %] 97 % (09/23 2117)  Intake/Output from previous day: 09/22 0701 - 09/23 0700 In: 2500 [I.V.:2500] Out: 3850 [Urine:3850]  Physical Exam:  Well built and normal bodily habitus, in no acute distress. Appears stated age. Alert Ox3 and in no acute distress.  There is no cyanosis. HEENT: normal limits. Pectus excavatum present.  CARDIAC EXAM: S1 S2 normal, no gallop, III/VI SEM noted in apex and right sternal border. Murmur decreases in intensity with sqatting.  CHEST EXAM: No tenderness of chest wall. Pectus excavatum noted. LUNGS: Clear to percuss and auscultate. No rales or ronchi.  ABDOMEN Post op dressing noted, mildly distended, bowel sounds are now heard, but sluggish and faint. EXTREMITY: No edema. Normal pulses. MUSCULOSKELETAL EXAM: Intact with full range of motion in all 4 extremities.  NEUROLOGIC EXAM: Grossly intact without any focal deficits. Alert O x 3.  VASCULAR EXAM: No skin breakdown. Carotids faint conducted murmur present as bruit. Extremities: Femoral pulse normal. Otherwise no bruit, all pulses intact. No prominent pulse felt in the abdomen. No varicose veins.    Lab Results:  Basename 12/28/11 0040 12/27/11 0550  WBC 6.7 7.7  HGB 9.9* 9.6*  PLT 102* 99*    Basename 12/28/11 0040 12/27/11 0550  NA 136 137  K 3.2* 3.3*  CL 101 104  CO2 27 27  GLUCOSE 116* 133*  BUN 4* 5*  CREATININE  0.82 0.89    Assessment/Plan:  1. 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  2. Mild aortic stenosis and regurgitation  3. CAD: H/O proximal LAD stent (4.0 x 26 mm non-drug-eluting integrity stent 11/11/2010.  4. Chronic thrombocytopenia, mild and asymptomatic and stable. 5. Post operative ilius. Patient has not passed gas and BS are now present but sluggish. 6. Mild hypokalemia.   Recommendation: Although he has been given by mouth medications, I would prefer him to be on low-dose of beta blocker on a more frequent basis to reduce his heart rate. Tolerating low dose beta blockers without problems. He has no clinical evidence of congestive heart failure in spite of aggressive fluid resuscitation. He remains asymptomatic with good urinary output. I will continue the same. If K low, may need to change IVF to Ringer's Lactate solution.    [name], M.D. 12/28/2011, 10:37 PM   Patient has been started on oral intake. Would also start the patient on ASA 81 mg po q daily if ok with Dr. Wendie Agreste. Otherwise discharge on Lopressor 12.5 mg po QID from  Cardiac stand point. Please call me if I can  Be of assistance. My cell [phone] Pager: [phone] Office [phone]

## 2011-12-29 ENCOUNTER — Encounter (HOSPITAL_COMMUNITY): Payer: Self-pay | Admitting: Surgery

## 2011-12-29 LAB — MAGNESIUM: Magnesium: 1.6 mg/dL (ref 1.5–2.5)

## 2011-12-29 LAB — BASIC METABOLIC PANEL
BUN: 5 mg/dL — ABNORMAL LOW (ref 6–23)
GFR calc Af Amer: >90 mL/min (ref >90)
GFR calc non Af Amer: 84 mL/min — ABNORMAL LOW (ref >90)
Potassium: 3.5 mEq/L (ref 3.5–5.1)
Sodium: 138 mEq/L (ref 135–145)

## 2011-12-29 NOTE — Progress Notes (Addendum)
General Surgery Note  LOS: 5 days  POD# 4 Room - 6 [facility] 08  Assessment/Plan:  1.  COLON RESECTION LAPAROSCOPIC - D. 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  Moved out of unit to floor yesterday  Still no BM.  Continue with sips only.  Continue to encourage ambulation.   2. Strep Bovis endocarditis. Treated   Followed by Dr. Daiva Eves  3. HYPERTROPHIC OBSTRUCTIVE CARDIOMYOPATHY   Followed by Dr. Jacinto Halim in the hospital.  Dr. Nadara Eaton had started po beta blocker yesterday, but will hold this until bowel function returns. 4. HTN (hypertension)  5. BPH (benign prostatic hyperplasia)   6. Subaortic stenosis  7. Back pain  8. Hyperlipidemia  9. On Plavix, on hold. 10. Bilateral inguinal hernias. Right mildly symptomatic.   We will address these at a later date.  12.  Hypokalemia - barely normal 13.  Foley out  Voiding well. 14.  DVT proph - SQ Heparin  Subjective:  Doing well. Still no flatus or BM. Objective:   Filed Vitals:   12/29/11 0532  BP: 146/74  Pulse: 103  Temp: 97.9 F (36.6 C)  Resp: 14     Intake/Output from previous day:  09/23 0701 - 09/24 0700 In: 1903.3 [I.V.:1903.3] Out: 2700 [Urine:2700]  Intake/Output this shift:      Physical Exam:   General: WN WM who is alert and oriented.    HEENT: Normal. Pupils equal. .   Lungs: clear   Abdomen: Soft, with good BS.   Wound: Clean.   Neurologic:  Grossly intact to motor and sensory function.   Psychiatric: Has normal mood and affect.   Lab Results:     Basename 12/28/11 0040 12/27/11 0550  WBC 6.7 7.7  HGB 9.9* 9.6*  HCT 29.6* 29.2*  PLT 102* 99*    BMET    Basename 12/29/11 0448 12/28/11 0040  NA 138 136  K 3.5 3.2*  CL 103 101  CO2 29 27  GLUCOSE 115* 116*  BUN 5* 4*  CREATININE 0.91 0.82  CALCIUM 8.9 8.6    PT/INR  No results found for this basename: LABPROT:2,INR:2 in the last 72 hours  ABG  No results found for this basename: PHART:2,PCO2:2,PO2:2,HCO3:2  in the last 72 hours   Studies/Results:  No results found.   Anti-infectives:   Anti-infectives     Start     Dose/Rate Route Frequency Ordered Stop   12/25/11 1815   cefOXitin (MEFOXIN) 1 g in dextrose 5 % 50 mL IVPB        1 g 100 mL/hr over 30 Minutes Intravenous 3 times per day 12/25/11 1738 12/26/11 0252   12/24/11 1500   neomycin (MYCIFRADIN) tablet 1,000 mg        1,000 mg Oral Every  3 hours 12/24/11 1137 12/24/11 2308   12/24/11 1219   cefOXitin (MEFOXIN) 2 g in dextrose 5 % 50 mL IVPB        2 g 100 mL/hr over 30 Minutes Intravenous 60 min pre-op 12/24/11 1220 12/25/11 0955   12/24/11 1200   erythromycin (E-MYCIN) tablet 1,000 mg        1,000 mg Oral Every  3 hours 12/24/11 1132 12/25/11 0000          [name], MD, FACS Pager: [phone],   [facility] [place] [facility] Office: [phone] 12/29/2011

## 2011-12-30 MED ORDER — METOPROLOL TARTRATE 12.5 MG HALF TABLET
12.5000 mg | ORAL_TABLET | Freq: Four times a day (QID) | ORAL | Status: DC
  Administered 2011-12-30 – 2012-01-01 (×6): 12.5 mg via ORAL
  Filled 2011-12-30 (×12): qty 1

## 2011-12-30 MED ORDER — HYDROCODONE-ACETAMINOPHEN 5-325 MG PO TABS
1.0000 | ORAL_TABLET | ORAL | Status: DC | PRN
  Administered 2011-12-30 – 2012-01-01 (×8): 2 via ORAL
  Filled 2011-12-30 (×7): qty 2
  Filled 2011-12-30: qty 1
  Filled 2011-12-30: qty 2

## 2011-12-30 NOTE — Progress Notes (Addendum)
General Surgery Note  LOS: 6 days  POD# 5 Room - 6 [facility] 08  Assessment/Plan:  1.  COLON RESECTION LAPAROSCOPIC - D. 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  Had BM yesterday, started on clr liquids.  Some abdominal soreness.  Will go slow with advancing diet.  To advance to full liquids   2. Strep Bovis endocarditis. Treated   Followed by Dr. Daiva Eves  3. HYPERTROPHIC OBSTRUCTIVE CARDIOMYOPATHY   Followed by Dr. Jacinto Halim in the hospital.  Start oral lopressor 4. HTN (hypertension)  5. BPH (benign prostatic hyperplasia)   6. Subaortic stenosis  7. Back pain  8. Hyperlipidemia  9. On Plavix, on hold. 10. Bilateral inguinal hernias. Right mildly symptomatic.   We will address these at a later date.  11.  Hypokalemia - barely normal 12.  DVT proph - SQ Heparin  Subjective:  Doing well. Has had BM and flatus over last 24 hours.  A little more sore in right mid abdomen.  But tolerated liquids. Objective:   Filed Vitals:   12/30/11 0507  BP: 118/72  Pulse: 90  Temp: 97.8 F (36.6 C)  Resp: 13     Intake/Output from previous day:  09/24 0701 - 09/25 0700 In: 2534.3 [P.O.:360; I.V.:2174.3] Out: 3076 [Urine:3075; Stool:1]  Intake/Output this shift:      Physical Exam:   General: WN WM who is alert and oriented.    HEENT: Normal. Pupils equal. .   Lungs: clear   Abdomen: Soft, with good BS.   Wound: Clean.   Neurologic:  Grossly intact to motor and sensory function.   Psychiatric: Has normal mood and affect.   Lab Results:     [facility] 12/28/11 0040  WBC 6.7  HGB 9.9*  HCT 29.6*  PLT 102*    BMET    Basename 12/29/11 0448 12/28/11 0040  NA 138 136  K 3.5 3.2*  CL 103 101  CO2 29 27  GLUCOSE 115* 116*  BUN 5* 4*  CREATININE 0.91 0.82  CALCIUM 8.9 8.6    PT/INR  No results found for this basename: LABPROT:2,INR:2 in the last 72 hours  ABG  No results found for this basename: PHART:2,PCO2:2,PO2:2,HCO3:2 in the last 72  hours   Studies/Results:  No results found.   Anti-infectives:   Anti-infectives     Start     Dose/Rate Route Frequency Ordered Stop   12/25/11 1815   cefOXitin (MEFOXIN) 1 g in dextrose 5 % 50 mL IVPB        1 g 100 mL/hr over 30 Minutes Intravenous 3 times per day 12/25/11 1738 12/26/11 0252   12/24/11 1500   neomycin (MYCIFRADIN) tablet 1,000 mg        1,000 mg Oral Every  3 hours 12/24/11 1137 12/24/11 2308   12/24/11 1219   cefOXitin (MEFOXIN) 2 g in dextrose 5 % 50 mL IVPB        2 g 100 mL/hr over 30 Minutes Intravenous 60 min pre-op 12/24/11 1220 12/25/11 0955   12/24/11 1200   erythromycin (E-MYCIN) tablet 1,000 mg        1,000 mg Oral Every  3 hours 12/24/11 1132 12/25/11 0000          [name], MD, FACS Pager: [phone],   [facility] [place] [facility] Office: [phone] 12/30/2011

## 2011-12-31 MED ORDER — ASPIRIN 81 MG PO CHEW
81.0000 mg | CHEWABLE_TABLET | Freq: Every day | ORAL | Status: DC
  Administered 2011-12-31 – 2012-01-01 (×2): 81 mg via ORAL
  Filled 2011-12-31 (×2): qty 1

## 2011-12-31 NOTE — Progress Notes (Signed)
General Surgery Note  LOS: 7 days  POD# 6 Room - 6 [facility] 08  Assessment/Plan:  1.  COLON RESECTION LAPAROSCOPIC - D. 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  Tolerated full liquids, to advance to reg diet.  Will stop IVF.  Possibly home tomorrow.   2. Strep Bovis endocarditis. Treated   Followed by Dr. Daiva Eves  3. HYPERTROPHIC OBSTRUCTIVE CARDIOMYOPATHY   Followed by Dr. Jacinto Halim in the hospital.  Start oral lopressor.  Lopressor held a couple of times for sys BP < 100. 4. HTN (hypertension)  5. BPH (benign prostatic hyperplasia)   6. Subaortic stenosis  7. Back pain  8. Hyperlipidemia  9. On Plavix, on hold. 10. Bilateral inguinal hernias. Right mildly symptomatic.   We will address these at a later date. 11.  Foreskin edema - should resolve 12.  DVT proph - SQ Heparin  Subjective:  Doing well. Tolerated full liquids. Will advance to reg diet.  Encourage ambulation. Objective:   Filed Vitals:   12/31/11 0548  BP: 92/53  Pulse: 96  Temp: 97.7 F (36.5 C)  Resp: 14     Intake/Output from previous day:  09/25 0701 - 09/26 0700 In: 2415 [P.O.:1080; I.V.:1335] Out: 2000 [Urine:2000]  Intake/Output this shift:      Physical Exam:   General: WN WM who is alert and oriented.    HEENT: Normal. Pupils equal. .   Lungs: Clear   Abdomen: Soft, with good BS.   Wound: Clean.   Neurologic:  Grossly intact to motor and sensory function.   Psychiatric: Has normal mood and affect.   Lab Results:    No results found for this basename: WBC:2,HGB:2,HCT:2,PLT:2 in the last 72 hours  BMET    [facility] 12/29/11 0448  NA 138  K 3.5  CL 103  CO2 29  GLUCOSE 115*  BUN 5*  CREATININE 0.91  CALCIUM 8.9    PT/INR  No results found for this basename: LABPROT:2,INR:2 in the last 72 hours  ABG  No results found for this basename: PHART:2,PCO2:2,PO2:2,HCO3:2 in the last 72 hours   Studies/Results:  No results  found.   Anti-infectives:   Anti-infectives     Start     Dose/Rate Route Frequency Ordered Stop   12/25/11 1815   cefOXitin (MEFOXIN) 1 g in dextrose 5 % 50 mL IVPB        1 g 100 mL/hr over 30 Minutes Intravenous 3 times per day 12/25/11 1738 12/26/11 0252   12/24/11 1500   neomycin (MYCIFRADIN) tablet 1,000 mg        1,000 mg Oral Every  3 hours 12/24/11 1137 12/24/11 2308   12/24/11 1219   cefOXitin (MEFOXIN) 2 g in dextrose 5 % 50 mL IVPB        2 g 100 mL/hr over 30 Minutes Intravenous 60 min pre-op 12/24/11 1220 12/25/11 0955   12/24/11 1200   erythromycin (E-MYCIN) tablet 1,000 mg        1,000 mg Oral Every  3 hours 12/24/11 1132 12/25/11 0000          [name], MD, FACS Pager: [phone],   [facility] [place] [facility] Office: [phone] 12/31/2011

## 2012-01-01 NOTE — Progress Notes (Signed)
General Surgery Note  LOS: 8 days  POD# 7 Room - 6 [facility] 08  Assessment/Plan:  1.  COLON RESECTION LAPAROSCOPIC - D. 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  On reg diet  Home today.  Staples out.  D/C home.   2. Strep Bovis endocarditis. Treated   Followed by Dr. Daiva Eves  3. HYPERTROPHIC OBSTRUCTIVE CARDIOMYOPATHY   Followed by Dr. Jacinto Halim in the hospital.  Start oral lopressor.  Lopressor held a couple of times for sys BP < 100. 4. HTN (hypertension)  5. BPH (benign prostatic hyperplasia)   6. Subaortic stenosis  7. Back pain  8. Hyperlipidemia  9. On Plavix, on hold. 10. Bilateral inguinal hernias. Right mildly symptomatic.   We will address these at a later date. 11.  Foreskin edema - should resolve 12.  DVT proph - SQ Heparin  Subjective:  Doing well. Tolerated reg diet.  To go home today. Objective:   Filed Vitals:   01/01/12 0548  BP: 107/60  Pulse: 73  Temp: 97.9 F (36.6 C)  Resp: 18     Intake/Output from previous day:  09/26 0701 - 09/27 0700 In: 920 [P.O.:920] Out: 1200 [Urine:1200]  Intake/Output this shift:      Physical Exam:   General: WN WM who is alert and oriented.    HEENT: Normal. Pupils equal. .   Lungs: Clear   Abdomen: Soft, with good BS.   Wound: Clean.  Will remove staples prior to discharge.   Neurologic:  Grossly intact to motor and sensory function.   Psychiatric: Has normal mood and affect.   Lab Results:    No results found for this basename: WBC:2,HGB:2,HCT:2,PLT:2 in the last 72 hours  BMET   No results found for this basename: NA:2,K:2,CL:2,CO2:2,GLUCOSE:2,BUN:2,CREATININE:2,CALCIUM:2 in the last 72 hours  PT/INR  No results found for this basename: LABPROT:2,INR:2 in the last 72 hours  ABG  No results found for this basename: PHART:2,PCO2:2,PO2:2,HCO3:2 in the last 72 hours   Studies/Results:  No results found.   Anti-infectives:   Anti-infectives     Start     Dose/Rate Route  Frequency Ordered Stop   12/25/11 1815   cefOXitin (MEFOXIN) 1 g in dextrose 5 % 50 mL IVPB        1 g 100 mL/hr over 30 Minutes Intravenous 3 times per day 12/25/11 1738 12/26/11 0252   12/24/11 1500   neomycin (MYCIFRADIN) tablet 1,000 mg        1,000 mg Oral Every  3 hours 12/24/11 1137 12/24/11 2308   12/24/11 1219   cefOXitin (MEFOXIN) 2 g in dextrose 5 % 50 mL IVPB        2 g 100 mL/hr over 30 Minutes Intravenous 60 min pre-op 12/24/11 1220 12/25/11 0955   12/24/11 1200   erythromycin (E-MYCIN) tablet 1,000 mg        1,000 mg Oral Every  3 hours 12/24/11 1132 12/25/11 0000          [name], MD, FACS Pager: [phone],   [facility] [place] [facility] Office: [phone] 01/01/2012

## 2012-01-01 NOTE — Progress Notes (Signed)
Patient discharged to home with instructions. 

## 2012-01-01 NOTE — Discharge Summary (Signed)
[name], [name]            ACCOUNT NO.:  [id number]  MEDICAL RECORD NO.:  [id number]  LOCATION:  6N08C                        FACILITY:  [facility]  PHYSICIAN:  Sandria Bales. Ezzard Standing, M.D.  DATE OF BIRTH:  1940-08-06  DATE OF ADMISSION:  12/24/2011 DATE OF DISCHARGE:  01/01/2012                              DISCHARGE SUMMARY   DISCHARGE DIAGNOSES: 1. Tubulovillous adenoma of cecum.  No evidence of cancer. 2. History of Streptococcus bovis endocarditis. 3. Hypertrophic obstructive cardiomyopathy. 4. Hypertension. 5. Benign prostatic hypertrophy. 6. Subaortic stenosis. 7. History of back pain. 8. Hyperlipidemia. 9. History on Plavix, but he held this for surgery. 10.Bilateral inguinal hernias with right being mildly symptomatic.     This will be addressed at a later date. 11.Mild foreskin edema.  OPERATIONS PERFORMED:  The patient underwent a laparoscopic-assisted right hemicolectomy on December 25, 2011.  HISTORY OF PRESENT ILLNESS:  [name] is a 71 year old white male who sees Dr. Juline [name] as his primary medical doctor.  He has had recent hospitalizations for bacterial endocarditis.  On his most recent hospitalization, he was identified as having streptococcal bovis as one of the agents of his endocarditis.  He then underwent a colonoscopy by Dr. Dorena Cookey, which showed a right colon/cecal tumor.  CT scan done on November 09, 2011, showed an intraluminal cecal mass consistent with the findings from Dr. Madilyn Fireman.  He had no lymph adenopathy or evidence of metastatic disease.  He did have bilateral inguinal hernias noted and had bilateral stable adrenal gland nodules.  Discussion was carried out with the patient about proceeding with right hemicolectomy.  I spoke with Dr. Jeanella Cara regarding his heart and Dr. Jacinto Halim thought he will be best served by being admitted the night before for hydration purposes.  Mr. Tondre other comorbid problems include: 1. Hypertrophic obstructive  cardiomyopathy followed by Dr. Jacinto Halim. 2. Hypertension. 3. Benign prostatic hypertrophy, though he really does not have much     in the way urologic symptoms. 4. History of back pain. 5. Hyperlipidemia. 6. History of being on Plavix, which will be held.  HOSPITAL COURSE:  The patient was admitted to the hospital on December 24, 2011.  He underwent a mechanical antibiotic bowel prep and then on December 25, 2011, he was taken to the operating room where he underwent a laparoscopic-assisted right hemicolectomy.  Postoperatively, I put the patient in step-down at 2900, because of his history of heart disease.  He was placed on subcu heparin for DVT prophylaxis.  He had a Foley left in place for 2 days.  He did have some mild hypokalemia early after surgery, which was corrected.  He was started on sips of water about the 3rd or 4th day when his NG tube was removed.  He had a bowel movement on the 4th day and was advanced to slowly to a regular diet.   He is now 7 days postop tolerating regular food.  He has had bowel movements.  He has had no evidence of infection and has done well.  His final path report showed a tubulovillous adenoma of the right colon. He had 15 lymph nodes, which were negative, and I reviewed this pathology with him.  He is now ready for discharge.   He will resume his home medications.   He was given Vicodin for pain for going home with.   He can drive in 3-4 days if he is doing well.  I have told not to do heavy lifting for a total of 3 more weeks.  He has already been in the shower, can shower at home. His diet is as tolerated.  I have encouraged him not to use any laxative if he was constipated, but just increase his fluid intake.   He is going to see me back in 2 weeks for followup.   His staples will be removed before discharge.  DISCHARGE CONDITION:  Good.   Sandria Bales. Ezzard Standing, M.D., FACS   DHN/MEDQ  D:  01/01/2012  T:  01/01/2012  Job:  [id number]  cc:    Everardo All. Madilyn Fireman, M.D. Acey Lav, MD [name], MD [name], M.D.

## 2012-01-05 ENCOUNTER — Telehealth (INDEPENDENT_AMBULATORY_CARE_PROVIDER_SITE_OTHER): Payer: Self-pay

## 2012-01-05 NOTE — Telephone Encounter (Signed)
Pt called today to report a small blister at the edge of his steri strip.  No excessive redness at incision site.  I recommended he cover the area with a bandaid, keep it dry, and if it was worse tomorrow or he was still concerned to call us to be seen in the urgent office clinic.  He understood and agreed.

## 2012-01-07 ENCOUNTER — Telehealth (INDEPENDENT_AMBULATORY_CARE_PROVIDER_SITE_OTHER): Payer: Self-pay

## 2012-01-07 NOTE — Telephone Encounter (Signed)
Pt aware of post op ov  For 01/28/12@12n 

## 2012-01-12 DIAGNOSIS — I38 Endocarditis, valve unspecified: Secondary | ICD-10-CM | POA: Diagnosis not present

## 2012-01-12 DIAGNOSIS — I2510 Atherosclerotic heart disease of native coronary artery without angina pectoris: Secondary | ICD-10-CM | POA: Diagnosis not present

## 2012-01-12 DIAGNOSIS — B379 Candidiasis, unspecified: Secondary | ICD-10-CM | POA: Diagnosis not present

## 2012-01-12 DIAGNOSIS — Z9889 Other specified postprocedural states: Secondary | ICD-10-CM | POA: Diagnosis not present

## 2012-01-28 ENCOUNTER — Encounter (INDEPENDENT_AMBULATORY_CARE_PROVIDER_SITE_OTHER): Payer: Self-pay | Admitting: Surgery

## 2012-01-28 ENCOUNTER — Ambulatory Visit (INDEPENDENT_AMBULATORY_CARE_PROVIDER_SITE_OTHER): Payer: Medicare Other | Admitting: Surgery

## 2012-01-28 VITALS — BP 110/70 | HR 90 | Temp 97.5°F | Resp 12 | Ht 72.0 in | Wt 179.0 lb

## 2012-01-28 DIAGNOSIS — K6389 Other specified diseases of intestine: Secondary | ICD-10-CM

## 2012-01-28 NOTE — Progress Notes (Signed)
[facility]  [name], MD,  FACS [address] [place]  [address] [name], [place]    [address] Phone:  [phone] FAX:  [phone]   Re:   [name] DOB:   10/21/1940 MRN:   [id number]  ASSESSMENT AND PLAN: 1.  Right colon mass.  Tubovillous adenoma.  Dr. Greggory Brandy found on colonoscopy on 11/11/2011.  Bxes revealed tubo-villous adenoma.  Right hemi colectomy - 12/25/2011.  Final path tubo villous adenoma.  Doing well.  Recommend follow up colonoscopy in 1 to 2 years.  Follow up PRN.  2.  Strep Bovis endocarditis.  Treated  Followed by Dr. Daiva Eves  3.  HYPERTROPHIC OBSTRUCTIVE CARDIOMYOPATHY   Sees Dr. Jeanella Cara.   4.  HTN (hypertension)  5.  BPH (benign prostatic hyperplasia)  6.  Subaortic stenosis   7.  Back pain  8.  Hyperlipidemia  9.  On Plavix. 10.  Bilateral inguinal hernias.    Right mildly symptomatic.  We will do nothing to these right now.  HISTORY OF PRESENT ILLNESS: Chief Complaint  Patient presents with  . Routine Post Op   [name] is a 71 y.o. (DOB: 07/27/1940)  white male who is a patient of PANG,RICHARD, MD and comes to me today for post op for colon resection.  Has done well post colon surgery.  He still has a limited appetite.  This should improve.  He still has some loose stools.  This should improve.  I reviewed his path with him.  Current Outpatient Prescriptions  Medication Sig Dispense Refill  . Arginine 500 MG CAPS Take 500 mg by mouth 2 (two) times daily.      . Ascorbic Acid (VITAMIN C) 500 MG tablet Take 500 mg by mouth 2 (two) times daily.        Marland Kitchen aspirin 81 MG tablet Take 81 mg by mouth daily.      . cholecalciferol (VITAMIN D) 1000 UNITS tablet Take 2,000 Units by mouth daily.       Marland Kitchen CINNAMON PO Take 500 mg by mouth 2 (two) times daily.      . clopidogrel (PLAVIX) 75 MG tablet Take 75 mg by mouth daily.      . Coenzyme Q10 (EQL COQ10) 300 MG CAPS Take 1 capsule by mouth daily.        . Ginkgo  Biloba 60 MG CAPS Take 1 capsule by mouth daily.        Marland Kitchen glucosamine-chondroitin 500-400 MG tablet Take 2 tablets by mouth 2 (two) times daily.      Marland Kitchen HAWTHORN BERRY PO Take 1 capsule by mouth daily.       . Horse Chestnut 300 MG CAPS Take 1 capsule by mouth daily.        Marland Kitchen Hyaluronic Acid-Vitamin C (HYALURONIC ACID PO) Take 40 mg by mouth daily.      Boris Lown Oil 300 MG CAPS Take 300 mg by mouth daily.      . metoprolol succinate (TOPROL-XL) 25 MG 24 hr tablet Take 25 mg by mouth daily.      . Multiple Vitamin (MULTIVITAMIN WITH MINERALS) TABS Take 1 tablet by mouth daily.      Marland Kitchen omeprazole (PRILOSEC) 10 MG capsule Take 10 mg by mouth daily as needed. For upset stomach      . OVER THE COUNTER MEDICATION Take 1,000 mg by mouth daily. BILLBERRY CAPSULE 1000MG       . pravastatin (PRAVACHOL) 40 MG tablet  Take 40 mg by mouth daily.      . Probiotic Product (PROBIOTIC DAILY PO) Take by mouth. Digestive care      . saw palmetto 160 MG capsule Take 160 mg by mouth.      . vitamin A 8000 UNIT capsule Take 8,000 Units by mouth daily.      . vitamin B-12 (CYANOCOBALAMIN) 1000 MCG tablet Take 1,000 mcg by mouth daily.      . vitamin E 400 UNIT capsule Take 400 Units by mouth 2 (two) times daily.      Marland Kitchen VITAMIN K PO Take 45 mcg by mouth daily. Vit k2         PHYSICAL EXAM: BP 110/70  Pulse 90  Temp 97.5 F (36.4 C) (Oral)  Resp 12  Ht 6' (1.829 m)  Wt 179 lb (81.194 kg)  BMI 24.28 kg/m2  General: WN WM who is alert and generally healthy appearing.  Abdomen: Soft. No mass. No tenderness. Normal bowel sounds. Incisions look good. Some soreness.  DATA REVIEWED: Path report to patient.  [name], MD, FACS Office:  [phone]

## 2012-02-17 DIAGNOSIS — I38 Endocarditis, valve unspecified: Secondary | ICD-10-CM | POA: Diagnosis not present

## 2012-02-17 DIAGNOSIS — E7800 Pure hypercholesterolemia, unspecified: Secondary | ICD-10-CM | POA: Diagnosis not present

## 2012-02-17 DIAGNOSIS — I2510 Atherosclerotic heart disease of native coronary artery without angina pectoris: Secondary | ICD-10-CM | POA: Diagnosis not present

## 2012-02-23 DIAGNOSIS — I10 Essential (primary) hypertension: Secondary | ICD-10-CM | POA: Diagnosis not present

## 2012-02-23 DIAGNOSIS — E785 Hyperlipidemia, unspecified: Secondary | ICD-10-CM | POA: Diagnosis not present

## 2012-02-23 DIAGNOSIS — D509 Iron deficiency anemia, unspecified: Secondary | ICD-10-CM | POA: Diagnosis not present

## 2012-02-23 DIAGNOSIS — I2510 Atherosclerotic heart disease of native coronary artery without angina pectoris: Secondary | ICD-10-CM | POA: Diagnosis not present

## 2012-04-21 DIAGNOSIS — I359 Nonrheumatic aortic valve disorder, unspecified: Secondary | ICD-10-CM | POA: Diagnosis not present

## 2012-04-21 DIAGNOSIS — I2510 Atherosclerotic heart disease of native coronary artery without angina pectoris: Secondary | ICD-10-CM | POA: Diagnosis not present

## 2012-04-21 DIAGNOSIS — R0602 Shortness of breath: Secondary | ICD-10-CM | POA: Diagnosis not present

## 2012-04-21 DIAGNOSIS — Z9861 Coronary angioplasty status: Secondary | ICD-10-CM | POA: Diagnosis not present

## 2012-04-22 DIAGNOSIS — E7800 Pure hypercholesterolemia, unspecified: Secondary | ICD-10-CM | POA: Diagnosis not present

## 2012-04-22 DIAGNOSIS — I2510 Atherosclerotic heart disease of native coronary artery without angina pectoris: Secondary | ICD-10-CM | POA: Diagnosis not present

## 2012-04-27 DIAGNOSIS — Z0000 Encounter for general adult medical examination without abnormal findings: Secondary | ICD-10-CM | POA: Diagnosis not present

## 2012-04-27 DIAGNOSIS — Z1331 Encounter for screening for depression: Secondary | ICD-10-CM | POA: Diagnosis not present

## 2012-04-27 DIAGNOSIS — I428 Other cardiomyopathies: Secondary | ICD-10-CM | POA: Diagnosis not present

## 2012-05-21 ENCOUNTER — Other Ambulatory Visit: Payer: Self-pay

## 2012-07-26 DIAGNOSIS — I428 Other cardiomyopathies: Secondary | ICD-10-CM | POA: Diagnosis not present

## 2012-07-26 DIAGNOSIS — E7800 Pure hypercholesterolemia, unspecified: Secondary | ICD-10-CM | POA: Diagnosis not present

## 2012-08-16 DIAGNOSIS — Q244 Congenital subaortic stenosis: Secondary | ICD-10-CM | POA: Diagnosis not present

## 2012-08-16 DIAGNOSIS — R0602 Shortness of breath: Secondary | ICD-10-CM | POA: Diagnosis not present

## 2012-08-16 DIAGNOSIS — I2510 Atherosclerotic heart disease of native coronary artery without angina pectoris: Secondary | ICD-10-CM | POA: Diagnosis not present

## 2012-08-16 DIAGNOSIS — Z9861 Coronary angioplasty status: Secondary | ICD-10-CM | POA: Diagnosis not present

## 2012-08-22 DIAGNOSIS — R300 Dysuria: Secondary | ICD-10-CM | POA: Diagnosis not present

## 2012-08-22 DIAGNOSIS — N419 Inflammatory disease of prostate, unspecified: Secondary | ICD-10-CM | POA: Diagnosis not present

## 2012-08-22 DIAGNOSIS — N390 Urinary tract infection, site not specified: Secondary | ICD-10-CM | POA: Diagnosis not present

## 2012-09-12 DIAGNOSIS — I2510 Atherosclerotic heart disease of native coronary artery without angina pectoris: Secondary | ICD-10-CM | POA: Diagnosis not present

## 2012-09-12 DIAGNOSIS — I10 Essential (primary) hypertension: Secondary | ICD-10-CM | POA: Diagnosis not present

## 2012-09-12 DIAGNOSIS — Z7902 Long term (current) use of antithrombotics/antiplatelets: Secondary | ICD-10-CM | POA: Diagnosis not present

## 2012-09-12 DIAGNOSIS — Z006 Encounter for examination for normal comparison and control in clinical research program: Secondary | ICD-10-CM | POA: Diagnosis not present

## 2012-09-12 DIAGNOSIS — I421 Obstructive hypertrophic cardiomyopathy: Secondary | ICD-10-CM | POA: Diagnosis not present

## 2012-11-09 ENCOUNTER — Other Ambulatory Visit: Payer: Self-pay

## 2012-11-11 ENCOUNTER — Emergency Department (HOSPITAL_COMMUNITY)
Admission: EM | Admit: 2012-11-11 | Discharge: 2012-11-11 | Disposition: A | Discharge status: Home / Self Care | Payer: Medicare Other | Attending: Emergency Medicine | Admitting: Emergency Medicine

## 2012-11-11 ENCOUNTER — Encounter (HOSPITAL_COMMUNITY): Payer: Self-pay | Admitting: Emergency Medicine

## 2012-11-11 DIAGNOSIS — M129 Arthropathy, unspecified: Secondary | ICD-10-CM | POA: Insufficient documentation

## 2012-11-11 DIAGNOSIS — W57XXXA Bitten or stung by nonvenomous insect and other nonvenomous arthropods, initial encounter: Secondary | ICD-10-CM | POA: Diagnosis not present

## 2012-11-11 DIAGNOSIS — Z8701 Personal history of pneumonia (recurrent): Secondary | ICD-10-CM | POA: Insufficient documentation

## 2012-11-11 DIAGNOSIS — R011 Cardiac murmur, unspecified: Secondary | ICD-10-CM | POA: Diagnosis not present

## 2012-11-11 DIAGNOSIS — E7800 Pure hypercholesterolemia, unspecified: Secondary | ICD-10-CM | POA: Diagnosis not present

## 2012-11-11 DIAGNOSIS — K219 Gastro-esophageal reflux disease without esophagitis: Secondary | ICD-10-CM | POA: Insufficient documentation

## 2012-11-11 DIAGNOSIS — Z8679 Personal history of other diseases of the circulatory system: Secondary | ICD-10-CM | POA: Diagnosis not present

## 2012-11-11 DIAGNOSIS — I10 Essential (primary) hypertension: Secondary | ICD-10-CM | POA: Insufficient documentation

## 2012-11-11 DIAGNOSIS — Y9289 Other specified places as the place of occurrence of the external cause: Secondary | ICD-10-CM | POA: Insufficient documentation

## 2012-11-11 DIAGNOSIS — M545 Low back pain, unspecified: Secondary | ICD-10-CM | POA: Insufficient documentation

## 2012-11-11 DIAGNOSIS — G8929 Other chronic pain: Secondary | ICD-10-CM | POA: Insufficient documentation

## 2012-11-11 DIAGNOSIS — Z7982 Long term (current) use of aspirin: Secondary | ICD-10-CM | POA: Diagnosis not present

## 2012-11-11 DIAGNOSIS — Y9389 Activity, other specified: Secondary | ICD-10-CM | POA: Insufficient documentation

## 2012-11-11 DIAGNOSIS — Z7902 Long term (current) use of antithrombotics/antiplatelets: Secondary | ICD-10-CM | POA: Diagnosis not present

## 2012-11-11 DIAGNOSIS — I2510 Atherosclerotic heart disease of native coronary artery without angina pectoris: Secondary | ICD-10-CM | POA: Insufficient documentation

## 2012-11-11 DIAGNOSIS — S90569A Insect bite (nonvenomous), unspecified ankle, initial encounter: Secondary | ICD-10-CM | POA: Diagnosis not present

## 2012-11-11 DIAGNOSIS — Z880 Allergy status to penicillin: Secondary | ICD-10-CM | POA: Insufficient documentation

## 2012-11-11 DIAGNOSIS — Z79899 Other long term (current) drug therapy: Secondary | ICD-10-CM | POA: Diagnosis not present

## 2012-11-11 DIAGNOSIS — I252 Old myocardial infarction: Secondary | ICD-10-CM | POA: Insufficient documentation

## 2012-11-11 DIAGNOSIS — S70361A Insect bite (nonvenomous), right thigh, initial encounter: Secondary | ICD-10-CM

## 2012-11-11 MED ORDER — SULFAMETHOXAZOLE-TRIMETHOPRIM 800-160 MG PO TABS: 1.0000 | ORAL_TABLET | Freq: Two times a day (BID) | ORAL | Status: DC

## 2012-11-11 NOTE — ED Provider Notes (Signed)
CSN: [id number]     Arrival date & time 11/11/12  2048 History    This chart was scribed for Junius Finner, PA working with Toy Baker, MD by Quintella Reichert, ED Scribe. This patient was seen in room WTR8/WTR8 and the patient's care was started at 9:25 PM.     Chief Complaint  Patient presents with  . Rash    The history is provided by the patient. No language interpreter was used.    HPI Comments: ZYKEE AVAKIAN is a 72 y.o. male who presents to the Emergency Department complaining of a possible insect bite to his inner right thigh that he first noticed 3 days ago and began spreading today.  Pt states that he first noticed an itchy red area the size of a nickel that looked and felt like a "mosquito bite or chigger bite."  He denies pain or tactile warmth to the area.  He notes that he was doing yardwork earlier that day but did not observe any insect.  He has applied hydrocortisone cream and used Bendaryl with some temporary relief of itching, but the area has grown larger throughout the day today. He denies fever, nausea, emesis or any other associated symptoms.  He denies any environmental allergies to his knowledge.  Pt is allergic to penicillin.  He denies sulfa allergies.  PCP is Dr. Ricki Miller   Past Medical History  Diagnosis Date  . Heart murmur   . Coronary artery disease   . Endocarditis   . Arthritis   . Hypertension   . GERD (gastroesophageal reflux disease)   . Myocardial infarction     POSSIBLE 2012 ELEVATED ENZYMES  . High cholesterol   . Pneumonia ~ 2011  . Migraines     "none in the last couple years" (12/24/2011)  . Chronic lower back pain     "last 3 months; usually when I bend" (12/24/2011)    Past Surgical History  Procedure Laterality Date  . Tee without cardioversion  11/06/2011    Procedure: TRANSESOPHAGEAL ECHOCARDIOGRAM (TEE);  Surgeon: [name], MD;  Location: [facility] ENDOSCOPY;  Service: Cardiovascular;  Laterality: N/A;  . Colonoscopy   11/11/2011    Procedure: COLONOSCOPY;  Surgeon: Barrie Folk, MD;  Location: [facility] ENDOSCOPY;  Service: Endoscopy;  Laterality: N/A;  . Coronary angioplasty with stent placement  11/11/2010    "1"  . Cystectomy  ~ 1980    right eyelid  . Colon resection  12/25/2011    Procedure: COLON RESECTION LAPAROSCOPIC;  Surgeon: Kandis Cocking, MD;  Location: [facility] OR;  Service: General;  Laterality: Right;  laparoscopic assisted right colon resection    History reviewed. No pertinent family history.   History  Substance Use Topics  . Smoking status: Never Smoker   . Smokeless tobacco: Never Used     Comment: 12/24/2011 "used to puff cigarettes; never inhaled"  . Alcohol Use: 0.6 oz/week    1 Cans of beer per week     Review of Systems  Constitutional: Negative for fever.  Gastrointestinal: Negative for nausea and vomiting.  Skin:       Insect bite  All other systems reviewed and are negative.      Allergies  Penicillins  Home Medications   Current Outpatient Rx  Name  Route  Sig  Dispense  Refill  . Arginine 500 MG CAPS   Oral   Take 500 mg by mouth 2 (two) times daily.         Marland Kitchen  Ascorbic Acid (VITAMIN C) 500 MG tablet   Oral   Take 500 mg by mouth 2 (two) times daily.           Marland Kitchen aspirin 81 MG tablet   Oral   Take 81 mg by mouth every morning.          . cholecalciferol (VITAMIN D) 1000 UNITS tablet   Oral   Take 2,000 Units by mouth every morning.          Marland Kitchen CINNAMON PO   Oral   Take 500 mg by mouth 2 (two) times daily.         . clopidogrel (PLAVIX) 75 MG tablet   Oral   Take 75 mg by mouth every morning.          . Coenzyme Q10 (EQL COQ10) 300 MG CAPS   Oral   Take 1 capsule by mouth every morning.          . diltiazem (CARDIZEM CD) 180 MG 24 hr capsule   Oral   Take 180 mg by mouth 2 (two) times daily.         . Ginkgo Biloba 60 MG CAPS   Oral   Take 1 capsule by mouth every morning.          Marland Kitchen glucosamine-chondroitin 500-400 MG tablet    Oral   Take 2 tablets by mouth 2 (two) times daily.         Marland Kitchen HAWTHORN BERRY PO   Oral   Take 1 capsule by mouth every morning.          . Horse Chestnut 300 MG CAPS   Oral   Take 1 capsule by mouth every morning.          Marland Kitchen Hyaluronic Acid-Vitamin C (HYALURONIC ACID PO)   Oral   Take 40 mg by mouth every morning.          Boris Lown Oil 300 MG CAPS   Oral   Take 300 mg by mouth every morning.          . metoprolol succinate (TOPROL-XL) 25 MG 24 hr tablet   Oral   Take 25 mg by mouth every morning.          . Multiple Vitamin (MULTIVITAMIN WITH MINERALS) TABS   Oral   Take 1 tablet by mouth every morning.          Marland Kitchen omeprazole (PRILOSEC) 10 MG capsule   Oral   Take 10 mg by mouth daily as needed (heartburn). For upset stomach         . OVER THE COUNTER MEDICATION   Oral   Take 1,000 mg by mouth every morning. BILLBERRY CAPSULE 1000MG          . pravastatin (PRAVACHOL) 40 MG tablet   Oral   Take 40 mg by mouth every morning.          . Probiotic Product (PROBIOTIC DAILY PO)   Oral   Take 1 capsule by mouth every morning. Digestive care         . saw palmetto 160 MG capsule   Oral   Take 160 mg by mouth every morning.          . vitamin A 8000 UNIT capsule   Oral   Take 8,000 Units by mouth every morning.          . vitamin B-12 (CYANOCOBALAMIN) 1000 MCG tablet   Oral   Take 1,000 mcg  by mouth every morning.          . vitamin E 400 UNIT capsule   Oral   Take 400 Units by mouth 2 (two) times daily.         Marland Kitchen VITAMIN K PO   Oral   Take 45 mcg by mouth every morning. Vit k2         . sulfamethoxazole-trimethoprim (SEPTRA DS) 800-160 MG per tablet   Oral   Take 1 tablet by mouth 2 (two) times daily.   10 tablet   0    BP 117/68  Pulse 81  Temp(Src) 98.8 F (37.1 C) (Oral)  Resp 20  Wt 195 lb (88.451 kg)  BMI 26.44 kg/m2  SpO2 97%  Physical Exam  Nursing note and vitals reviewed. Constitutional: He is oriented to  person, place, and time. He appears well-developed and well-nourished.  HENT:  Head: Normocephalic and atraumatic.  Eyes: EOM are normal.  Neck: Normal range of motion.  Cardiovascular: Normal rate.   Pulmonary/Chest: Effort normal.  Musculoskeletal: Normal range of motion.  Neurological: He is alert and oriented to person, place, and time.  Skin: Skin is warm and dry.  6cm area of erythema with 2-cm centralized induration.  No edema, warmth, or red streaking.  Not tender to palpation, not actively draining.  Psychiatric: He has a normal mood and affect. His behavior is normal.    ED Course  Procedures (including critical care time)  DIAGNOSTIC STUDIES: Oxygen Saturation is 97% on room air, normal by my interpretation.    COORDINATION OF CARE: 9:38 PM-Discussed treatment plan which includes antibiotics and f/u with PCP with pt at bedside and pt agreed to plan. Advised return precautions.   Labs Reviewed - No data to display  No results found.  1. Insect bite of right thigh, initial encounter      MDM  Rash does not appear to be "target shaped" and pt is addiment he has not been bit or seen any ticks.  Small area of induration and increased redness concerning for early cellulitis.  No drainable abscess present at this time. Will tx with bactrim and have pt f/u with PCP if area not improving.  Return sooner if worsening symptoms. Pt verbalized understanding and agreement with tx plan.   I personally performed the services described in this documentation, which was scribed in my presence. The recorded information has been reviewed and is accurate.    Junius Finner, PA-C 11/13/12 2025

## 2012-11-11 NOTE — ED Notes (Signed)
Patient states that he has a bug bite to his right thigh x 2 -3 days. The patient reports that it is itching and spreading today

## 2012-11-17 NOTE — ED Provider Notes (Signed)
Medical screening examination/treatment/procedure(s) were performed by non-physician practitioner and as supervising physician I was immediately available for consultation/collaboration.  Gregroy Dombkowski T Marley Pakula, MD 11/17/12 1202 

## 2012-11-21 DIAGNOSIS — I428 Other cardiomyopathies: Secondary | ICD-10-CM | POA: Diagnosis not present

## 2012-11-21 DIAGNOSIS — R5381 Other malaise: Secondary | ICD-10-CM | POA: Diagnosis not present

## 2012-11-21 DIAGNOSIS — I2510 Atherosclerotic heart disease of native coronary artery without angina pectoris: Secondary | ICD-10-CM | POA: Diagnosis not present

## 2012-11-24 DIAGNOSIS — E785 Hyperlipidemia, unspecified: Secondary | ICD-10-CM | POA: Diagnosis not present

## 2012-11-24 DIAGNOSIS — I2510 Atherosclerotic heart disease of native coronary artery without angina pectoris: Secondary | ICD-10-CM | POA: Diagnosis not present

## 2012-11-24 DIAGNOSIS — I10 Essential (primary) hypertension: Secondary | ICD-10-CM | POA: Diagnosis not present

## 2013-02-09 ENCOUNTER — Other Ambulatory Visit: Payer: Self-pay

## 2013-02-20 DIAGNOSIS — E785 Hyperlipidemia, unspecified: Secondary | ICD-10-CM | POA: Diagnosis not present

## 2013-02-20 DIAGNOSIS — I2510 Atherosclerotic heart disease of native coronary artery without angina pectoris: Secondary | ICD-10-CM | POA: Diagnosis not present

## 2013-02-20 DIAGNOSIS — Q244 Congenital subaortic stenosis: Secondary | ICD-10-CM | POA: Diagnosis not present

## 2013-02-20 DIAGNOSIS — I10 Essential (primary) hypertension: Secondary | ICD-10-CM | POA: Diagnosis not present

## 2013-02-20 DIAGNOSIS — I259 Chronic ischemic heart disease, unspecified: Secondary | ICD-10-CM | POA: Diagnosis not present

## 2013-02-20 DIAGNOSIS — R5381 Other malaise: Secondary | ICD-10-CM | POA: Diagnosis not present

## 2013-02-22 DIAGNOSIS — E785 Hyperlipidemia, unspecified: Secondary | ICD-10-CM | POA: Diagnosis not present

## 2013-02-22 DIAGNOSIS — I2510 Atherosclerotic heart disease of native coronary artery without angina pectoris: Secondary | ICD-10-CM | POA: Diagnosis not present

## 2013-02-22 DIAGNOSIS — I428 Other cardiomyopathies: Secondary | ICD-10-CM | POA: Diagnosis not present

## 2013-02-22 DIAGNOSIS — E559 Vitamin D deficiency, unspecified: Secondary | ICD-10-CM | POA: Diagnosis not present

## 2013-09-14 DIAGNOSIS — E559 Vitamin D deficiency, unspecified: Secondary | ICD-10-CM | POA: Diagnosis not present

## 2013-09-14 DIAGNOSIS — I428 Other cardiomyopathies: Secondary | ICD-10-CM | POA: Diagnosis not present

## 2013-09-14 DIAGNOSIS — E785 Hyperlipidemia, unspecified: Secondary | ICD-10-CM | POA: Diagnosis not present

## 2013-09-15 DIAGNOSIS — E785 Hyperlipidemia, unspecified: Secondary | ICD-10-CM | POA: Diagnosis not present

## 2013-09-15 DIAGNOSIS — Q244 Congenital subaortic stenosis: Secondary | ICD-10-CM | POA: Diagnosis not present

## 2013-09-15 DIAGNOSIS — I2510 Atherosclerotic heart disease of native coronary artery without angina pectoris: Secondary | ICD-10-CM | POA: Diagnosis not present

## 2013-09-15 DIAGNOSIS — D693 Immune thrombocytopenic purpura: Secondary | ICD-10-CM | POA: Diagnosis not present

## 2013-09-28 DIAGNOSIS — I2510 Atherosclerotic heart disease of native coronary artery without angina pectoris: Secondary | ICD-10-CM | POA: Diagnosis not present

## 2013-09-28 DIAGNOSIS — I428 Other cardiomyopathies: Secondary | ICD-10-CM | POA: Diagnosis not present

## 2013-09-28 DIAGNOSIS — Z0000 Encounter for general adult medical examination without abnormal findings: Secondary | ICD-10-CM | POA: Diagnosis not present

## 2013-09-28 DIAGNOSIS — Z1331 Encounter for screening for depression: Secondary | ICD-10-CM | POA: Diagnosis not present

## 2014-02-16 DIAGNOSIS — H2513 Age-related nuclear cataract, bilateral: Secondary | ICD-10-CM | POA: Diagnosis not present

## 2014-03-14 DIAGNOSIS — E785 Hyperlipidemia, unspecified: Secondary | ICD-10-CM | POA: Diagnosis not present

## 2014-03-14 DIAGNOSIS — I429 Cardiomyopathy, unspecified: Secondary | ICD-10-CM | POA: Diagnosis not present

## 2014-03-14 DIAGNOSIS — E559 Vitamin D deficiency, unspecified: Secondary | ICD-10-CM | POA: Diagnosis not present

## 2014-03-20 DIAGNOSIS — I2510 Atherosclerotic heart disease of native coronary artery without angina pectoris: Secondary | ICD-10-CM | POA: Diagnosis not present

## 2014-03-20 DIAGNOSIS — Z0001 Encounter for general adult medical examination with abnormal findings: Secondary | ICD-10-CM | POA: Diagnosis not present

## 2014-03-20 DIAGNOSIS — K219 Gastro-esophageal reflux disease without esophagitis: Secondary | ICD-10-CM | POA: Diagnosis not present

## 2014-03-20 DIAGNOSIS — I10 Essential (primary) hypertension: Secondary | ICD-10-CM | POA: Diagnosis not present

## 2014-03-20 DIAGNOSIS — I429 Cardiomyopathy, unspecified: Secondary | ICD-10-CM | POA: Diagnosis not present

## 2014-09-20 DIAGNOSIS — E780 Pure hypercholesterolemia: Secondary | ICD-10-CM | POA: Diagnosis not present

## 2014-09-20 DIAGNOSIS — D693 Immune thrombocytopenic purpura: Secondary | ICD-10-CM | POA: Diagnosis not present

## 2014-09-20 DIAGNOSIS — I10 Essential (primary) hypertension: Secondary | ICD-10-CM | POA: Diagnosis not present

## 2014-09-20 DIAGNOSIS — I2510 Atherosclerotic heart disease of native coronary artery without angina pectoris: Secondary | ICD-10-CM | POA: Diagnosis not present

## 2014-09-24 DIAGNOSIS — E785 Hyperlipidemia, unspecified: Secondary | ICD-10-CM | POA: Diagnosis not present

## 2014-09-24 DIAGNOSIS — I10 Essential (primary) hypertension: Secondary | ICD-10-CM | POA: Diagnosis not present

## 2014-09-24 DIAGNOSIS — Z0000 Encounter for general adult medical examination without abnormal findings: Secondary | ICD-10-CM | POA: Diagnosis not present

## 2014-09-24 DIAGNOSIS — I2510 Atherosclerotic heart disease of native coronary artery without angina pectoris: Secondary | ICD-10-CM | POA: Diagnosis not present

## 2014-10-01 ENCOUNTER — Other Ambulatory Visit: Payer: Self-pay

## 2014-10-18 DIAGNOSIS — Q244 Congenital subaortic stenosis: Secondary | ICD-10-CM | POA: Diagnosis not present

## 2014-10-22 DIAGNOSIS — Z0000 Encounter for general adult medical examination without abnormal findings: Secondary | ICD-10-CM | POA: Diagnosis not present

## 2014-10-22 DIAGNOSIS — I10 Essential (primary) hypertension: Secondary | ICD-10-CM | POA: Diagnosis not present

## 2014-10-22 DIAGNOSIS — E785 Hyperlipidemia, unspecified: Secondary | ICD-10-CM | POA: Diagnosis not present

## 2014-10-22 DIAGNOSIS — I25119 Atherosclerotic heart disease of native coronary artery with unspecified angina pectoris: Secondary | ICD-10-CM | POA: Diagnosis not present

## 2014-10-23 DIAGNOSIS — R0989 Other specified symptoms and signs involving the circulatory and respiratory systems: Secondary | ICD-10-CM | POA: Diagnosis not present

## 2014-10-25 DIAGNOSIS — E559 Vitamin D deficiency, unspecified: Secondary | ICD-10-CM | POA: Diagnosis not present

## 2014-10-25 DIAGNOSIS — I2510 Atherosclerotic heart disease of native coronary artery without angina pectoris: Secondary | ICD-10-CM | POA: Diagnosis not present

## 2014-10-25 DIAGNOSIS — D649 Anemia, unspecified: Secondary | ICD-10-CM | POA: Diagnosis not present

## 2014-10-25 DIAGNOSIS — F4310 Post-traumatic stress disorder, unspecified: Secondary | ICD-10-CM | POA: Diagnosis not present

## 2014-11-22 DIAGNOSIS — Z79899 Other long term (current) drug therapy: Secondary | ICD-10-CM | POA: Diagnosis not present

## 2014-11-22 DIAGNOSIS — D649 Anemia, unspecified: Secondary | ICD-10-CM | POA: Diagnosis not present

## 2014-11-29 DIAGNOSIS — I10 Essential (primary) hypertension: Secondary | ICD-10-CM | POA: Diagnosis not present

## 2014-11-29 DIAGNOSIS — I2510 Atherosclerotic heart disease of native coronary artery without angina pectoris: Secondary | ICD-10-CM | POA: Diagnosis not present

## 2014-11-29 DIAGNOSIS — E785 Hyperlipidemia, unspecified: Secondary | ICD-10-CM | POA: Diagnosis not present

## 2014-11-29 DIAGNOSIS — D649 Anemia, unspecified: Secondary | ICD-10-CM | POA: Diagnosis not present

## 2014-12-03 ENCOUNTER — Encounter (HOSPITAL_COMMUNITY): Payer: Self-pay | Admitting: Emergency Medicine

## 2014-12-03 ENCOUNTER — Emergency Department (INDEPENDENT_AMBULATORY_CARE_PROVIDER_SITE_OTHER)
Admission: EM | Admit: 2014-12-03 | Discharge: 2014-12-03 | Disposition: A | Discharge status: Home / Self Care | Payer: Medicare Other | Source: Home / Self Care

## 2014-12-03 DIAGNOSIS — N4889 Other specified disorders of penis: Secondary | ICD-10-CM | POA: Diagnosis not present

## 2014-12-03 DIAGNOSIS — N489 Disorder of penis, unspecified: Secondary | ICD-10-CM

## 2014-12-03 MED ORDER — MUPIROCIN 2 % EX OINT: 1.0000 "application " | TOPICAL_OINTMENT | Freq: Two times a day (BID) | CUTANEOUS | Status: DC

## 2014-12-03 MED ORDER — CLINDAMYCIN HCL 300 MG PO CAPS: 300.0000 mg | ORAL_CAPSULE | Freq: Three times a day (TID) | ORAL | Status: DC

## 2014-12-03 NOTE — ED Provider Notes (Signed)
CSN: [id number]     Arrival date & time 12/03/14  1631 History   None    Chief Complaint  Patient presents with  . Abscess   (Consider location/radiation/quality/duration/timing/severity/associated sxs/prior Treatment) HPI   3 days of penil irritation started as redness. Bleeding intermittently. Triple ABX ointment w/ some improvent initially. Skin sloughing today. Not painful, but "uncomfortable." Non-itchy. No new soaps lotions or detergents. Problem is constant, nonradiating, and getting worse. Nice fevers, penile discharge, testicular tenderness or swelling, coronary adenopathy, lower abdominal pain, nausea, vomiting, diarrhea, constipation, chest pain, shortness of breath, rash, fevers, neck stiffness, headache, unintentional weight loss, night sweats. Patient has not been sexually active and almost a year. Past Medical History  Diagnosis Date  . Heart murmur   . Coronary artery disease   . Endocarditis   . Arthritis   . Hypertension   . GERD (gastroesophageal reflux disease)   . Myocardial infarction     POSSIBLE 2012 ELEVATED ENZYMES  . High cholesterol   . Pneumonia ~ 2011  . Migraines     "none in the last couple years" (12/24/2011)  . Chronic lower back pain     "last 3 months; usually when I bend" (12/24/2011)   Past Surgical History  Procedure Laterality Date  . Tee without cardioversion  11/06/2011    Procedure: TRANSESOPHAGEAL ECHOCARDIOGRAM (TEE);  Surgeon: Laverda Page, MD;  Location: [place];  Service: Cardiovascular;  Laterality: N/A;  . Colonoscopy  11/11/2011    Procedure: COLONOSCOPY;  Surgeon: Missy Sabins, MD;  Location: [place];  Service: Endoscopy;  Laterality: N/A;  . Coronary angioplasty with stent placement  11/11/2010    "1"  . Cystectomy  ~ 1980    right eyelid  . Colon resection  12/25/2011    Procedure: COLON RESECTION LAPAROSCOPIC;  Surgeon: Shann Medal, MD;  Location: [place];  Service: General;  Laterality: Right;  laparoscopic  assisted right colon resection   Family History  Problem Relation Age of Onset  . Cancer Neg Hx   . Diabetes Neg Hx   . Heart failure Neg Hx   . Hyperlipidemia Neg Hx   . Hypertension Neg Hx    Social History  Substance Use Topics  . Smoking status: Never Smoker   . Smokeless tobacco: Never Used     Comment: 12/24/2011 "used to puff cigarettes; never inhaled"  . Alcohol Use: 0.6 oz/week    1 Cans of beer per week    Review of Systems Per HPI with all other pertinent systems negative.   Allergies  Penicillins  Home Medications   Prior to Admission medications   Medication Sig Start Date End Date Taking? Authorizing Provider  Arginine 500 MG CAPS Take 500 mg by mouth 2 (two) times daily.    Historical Provider, MD  Ascorbic Acid (VITAMIN C) 500 MG tablet Take 500 mg by mouth 2 (two) times daily.      Historical Provider, MD  aspirin 81 MG tablet Take 81 mg by mouth every morning.     Historical Provider, MD  cholecalciferol (VITAMIN D) 1000 UNITS tablet Take 2,000 Units by mouth every morning.     Historical Provider, MD  CINNAMON PO Take 500 mg by mouth 2 (two) times daily.    Historical Provider, MD  clindamycin (CLEOCIN) 300 MG capsule Take 1 capsule (300 mg total) by mouth 3 (three) times daily. 12/03/14   Waldemar Dickens, MD  clopidogrel (PLAVIX) 75 MG tablet Take 75 mg by mouth  every morning.     Historical Provider, MD  Coenzyme Q10 (EQL COQ10) 300 MG CAPS Take 1 capsule by mouth every morning.     Historical Provider, MD  diltiazem (CARDIZEM CD) 180 MG 24 hr capsule Take 180 mg by mouth 2 (two) times daily.    Historical Provider, MD  Ginkgo Biloba 60 MG CAPS Take 1 capsule by mouth every morning.     Historical Provider, MD  glucosamine-chondroitin 500-400 MG tablet Take 2 tablets by mouth 2 (two) times daily.    Historical Provider, MD  HAWTHORN BERRY PO Take 1 capsule by mouth every morning.     Historical Provider, MD  Horse Chestnut 300 MG CAPS Take 1 capsule by  mouth every morning.     Historical Provider, MD  Hyaluronic Acid-Vitamin C (HYALURONIC ACID PO) Take 40 mg by mouth every morning.     Historical Provider, MD  Javier Docker Oil 300 MG CAPS Take 300 mg by mouth every morning.     Historical Provider, MD  metoprolol succinate (TOPROL-XL) 25 MG 24 hr tablet Take 25 mg by mouth every morning.     Historical Provider, MD  Multiple Vitamin (MULTIVITAMIN WITH MINERALS) TABS Take 1 tablet by mouth every morning.     Historical Provider, MD  mupirocin ointment (BACTROBAN) 2 % Apply 1 application topically 2 (two) times daily. 12/03/14   Waldemar Dickens, MD  omeprazole (PRILOSEC) 10 MG capsule Take 10 mg by mouth daily as needed (heartburn). For upset stomach    Historical Provider, MD  OVER THE COUNTER MEDICATION Take 1,000 mg by mouth every morning. BILLBERRY CAPSULE 1000MG     Historical Provider, MD  pravastatin (PRAVACHOL) 40 MG tablet Take 40 mg by mouth every morning.     Historical Provider, MD  Probiotic Product (PROBIOTIC DAILY PO) Take 1 capsule by mouth every morning. Digestive care    Historical Provider, MD  saw palmetto 160 MG capsule Take 160 mg by mouth every morning.     Historical Provider, MD  sulfamethoxazole-trimethoprim (SEPTRA DS) 800-160 MG per tablet Take 1 tablet by mouth 2 (two) times daily. 11/11/12   Noland Fordyce, PA-C  vitamin A 8000 UNIT capsule Take 8,000 Units by mouth every morning.     Historical Provider, MD  vitamin B-12 (CYANOCOBALAMIN) 1000 MCG tablet Take 1,000 mcg by mouth every morning.     Historical Provider, MD  vitamin E 400 UNIT capsule Take 400 Units by mouth 2 (two) times daily.    Historical Provider, MD  VITAMIN K PO Take 45 mcg by mouth every morning. Vit k2    Historical Provider, MD   Meds Ordered and Administered this Visit  Medications - No data to display  BP 139/69 mmHg  Pulse 66  Temp(Src) 98.2 F (36.8 C) (Oral)  Resp 16  SpO2 97% No data found.   Physical Exam Physical Exam  Constitutional:  oriented to person, place, and time. appears well-developed and well-nourished. No distress.  HENT:  Head: Normocephalic and atraumatic.  Eyes: EOMI. PERRL.  Neck: Normal range of motion.  Cardiovascular: RRR, no m/r/g, 2+ distal pulses,  Pulmonary/Chest: Effort normal and breath sounds normal. No respiratory distress.  Abdominal: Soft. Bowel sounds are normal. NonTTP, no distension.  Musculoskeletal: Normal range of motion. Non ttp, no effusion.  Neurological: alert and oriented to person, place, and time.  Skin: Skin is warm. No rash noted. non diaphoretic.  Psychiatric: normal mood and affect. behavior is normal. Judgment and thought content normal.  GU: Penile shaft with solitary lesion/ulceration on the ventral surface near the base of the head. Overlying skin easily sloughs off with use of Q-tip. ED Course  Procedures (including critical care time)  Labs Review Labs Reviewed - No data to display  Imaging Review No results found.   Visual Acuity Review  Right Eye Distance:   Left Eye Distance:   Bilateral Distance:    Right Eye Near:   Left Eye Near:    Bilateral Near:         MDM   1. Penile lesion    Etiology not immediately clear but likely due to bacterial infection versus HSV. HSV culture sent. Start on clindamycin. Anabolic ointment and sterile bandage applied. Wound care instructions discussed.    Waldemar Dickens, MD 12/03/14 [phone]

## 2014-12-03 NOTE — Discharge Instructions (Signed)
The cause of your sore is not immediately clear but is likely due to a mild bacterial infection. Please use the antibiotics as prescribed. Those do not seem to be working entirely can also use the anabolic ointment. We'll send a culture and let you know if there is need for additional medications because of a more serious infection.

## 2014-12-03 NOTE — ED Notes (Signed)
C/o abscess on groin area since Saturday States area is swollen and red Used ointment as tx

## 2014-12-04 NOTE — ED Notes (Addendum)
Lab called w procedural collection issues. Called Dr Barbaraann Faster. New lab orders placed by this writer, called and discussed w Maudie Mercury in lab

## 2014-12-06 LAB — HERPES SIMPLEX VIRUS CULTURE
CULTURE: NOT DETECTED
SPECIAL REQUESTS: NORMAL

## 2014-12-18 NOTE — ED Notes (Signed)
Final report of herpes testing negative, no further action required

## 2015-01-13 ENCOUNTER — Emergency Department (HOSPITAL_COMMUNITY): Payer: Medicare Other

## 2015-01-13 ENCOUNTER — Emergency Department (HOSPITAL_COMMUNITY)
Admission: EM | Admit: 2015-01-13 | Discharge: 2015-01-13 | Disposition: A | Discharge status: Home / Self Care | Payer: Medicare Other | Attending: Emergency Medicine | Admitting: Emergency Medicine

## 2015-01-13 ENCOUNTER — Encounter (HOSPITAL_COMMUNITY): Payer: Self-pay

## 2015-01-13 DIAGNOSIS — R011 Cardiac murmur, unspecified: Secondary | ICD-10-CM | POA: Diagnosis not present

## 2015-01-13 DIAGNOSIS — E782 Mixed hyperlipidemia: Secondary | ICD-10-CM | POA: Diagnosis not present

## 2015-01-13 DIAGNOSIS — M1990 Unspecified osteoarthritis, unspecified site: Secondary | ICD-10-CM | POA: Diagnosis not present

## 2015-01-13 DIAGNOSIS — R531 Weakness: Secondary | ICD-10-CM | POA: Diagnosis not present

## 2015-01-13 DIAGNOSIS — K219 Gastro-esophageal reflux disease without esophagitis: Secondary | ICD-10-CM | POA: Insufficient documentation

## 2015-01-13 DIAGNOSIS — Z880 Allergy status to penicillin: Secondary | ICD-10-CM | POA: Insufficient documentation

## 2015-01-13 DIAGNOSIS — I252 Old myocardial infarction: Secondary | ICD-10-CM | POA: Diagnosis not present

## 2015-01-13 DIAGNOSIS — N179 Acute kidney failure, unspecified: Secondary | ICD-10-CM | POA: Diagnosis not present

## 2015-01-13 DIAGNOSIS — Z79899 Other long term (current) drug therapy: Secondary | ICD-10-CM | POA: Diagnosis not present

## 2015-01-13 DIAGNOSIS — I2510 Atherosclerotic heart disease of native coronary artery without angina pectoris: Secondary | ICD-10-CM | POA: Insufficient documentation

## 2015-01-13 DIAGNOSIS — Z8701 Personal history of pneumonia (recurrent): Secondary | ICD-10-CM | POA: Insufficient documentation

## 2015-01-13 DIAGNOSIS — R0602 Shortness of breath: Secondary | ICD-10-CM | POA: Diagnosis not present

## 2015-01-13 DIAGNOSIS — R030 Elevated blood-pressure reading, without diagnosis of hypertension: Secondary | ICD-10-CM | POA: Diagnosis not present

## 2015-01-13 DIAGNOSIS — G8929 Other chronic pain: Secondary | ICD-10-CM | POA: Diagnosis not present

## 2015-01-13 DIAGNOSIS — I10 Essential (primary) hypertension: Secondary | ICD-10-CM | POA: Diagnosis not present

## 2015-01-13 DIAGNOSIS — R5383 Other fatigue: Secondary | ICD-10-CM | POA: Diagnosis present

## 2015-01-13 LAB — CBC WITH DIFFERENTIAL/PLATELET
BASOS ABS: 0 10*3/uL (ref 0.0–0.1)
BASOS PCT: 0 %
Eosinophils Absolute: 0 10*3/uL (ref 0.0–0.7)
Eosinophils Relative: 1 %
HEMATOCRIT: 38.4 % — AB (ref 39.0–52.0)
HEMOGLOBIN: 12.9 g/dL — AB (ref 13.0–17.0)
LYMPHS PCT: 27 %
Lymphs Abs: 1.1 10*3/uL (ref 0.7–4.0)
MCH: 31.2 pg (ref 26.0–34.0)
MCHC: 33.6 g/dL (ref 30.0–36.0)
MCV: 93 fL (ref 78.0–100.0)
MONO ABS: 0.4 10*3/uL (ref 0.1–1.0)
Monocytes Relative: 9 %
NEUTROS ABS: 2.6 10*3/uL (ref 1.7–7.7)
NEUTROS PCT: 63 %
Platelets: 138 10*3/uL — ABNORMAL LOW (ref 150–400)
RBC: 4.13 MIL/uL — AB (ref 4.22–5.81)
RDW: 13.5 % (ref 11.5–15.5)
WBC: 4.2 10*3/uL (ref 4.0–10.5)

## 2015-01-13 LAB — COMPREHENSIVE METABOLIC PANEL
ALBUMIN: 4.1 g/dL (ref 3.5–5.0)
ALT: 19 U/L (ref 17–63)
ANION GAP: 8 (ref 5–15)
AST: 29 U/L (ref 15–41)
Alkaline Phosphatase: 74 U/L (ref 38–126)
BILIRUBIN TOTAL: 0.9 mg/dL (ref 0.3–1.2)
BUN: 18 mg/dL (ref 6–20)
CALCIUM: 9 mg/dL (ref 8.9–10.3)
CO2: 27 mmol/L (ref 22–32)
Chloride: 100 mmol/L — ABNORMAL LOW (ref 101–111)
Creatinine, Ser: 1.27 mg/dL — ABNORMAL HIGH (ref 0.61–1.24)
GFR calc non Af Amer: 55 mL/min — ABNORMAL LOW (ref >60)
GLUCOSE: 107 mg/dL — AB (ref 65–99)
POTASSIUM: 4.5 mmol/L (ref 3.5–5.1)
SODIUM: 135 mmol/L (ref 135–145)
TOTAL PROTEIN: 6.8 g/dL (ref 6.5–8.1)

## 2015-01-13 LAB — URINALYSIS, ROUTINE W REFLEX MICROSCOPIC
BILIRUBIN URINE: NEGATIVE
Glucose, UA: NEGATIVE mg/dL
Hgb urine dipstick: NEGATIVE
Ketones, ur: NEGATIVE mg/dL
LEUKOCYTES UA: NEGATIVE
NITRITE: NEGATIVE
PH: 7.5 (ref 5.0–8.0)
Protein, ur: NEGATIVE mg/dL
SPECIFIC GRAVITY, URINE: 1.01 (ref 1.005–1.030)
UROBILINOGEN UA: 0.2 mg/dL (ref 0.0–1.0)

## 2015-01-13 LAB — BRAIN NATRIURETIC PEPTIDE: B NATRIURETIC PEPTIDE 5: 205 pg/mL — AB (ref 0.0–100.0)

## 2015-01-13 LAB — I-STAT TROPONIN, ED: TROPONIN I, POC: 0.01 ng/mL (ref 0.00–0.08)

## 2015-01-13 MED ORDER — SODIUM CHLORIDE 0.9 % IV BOLUS (SEPSIS)
1000.0000 mL | Freq: Once | INTRAVENOUS | Status: AC
  Administered 2015-01-13: 1000 mL via INTRAVENOUS

## 2015-01-13 NOTE — Discharge Instructions (Signed)
Acute Kidney Injury °Acute kidney injury is any condition in which there is sudden (acute) damage to the kidneys. Acute kidney injury was previously known as acute kidney failure or acute renal failure. The kidneys are two organs that lie on either side of the spine between the middle of the back and the front of the abdomen. The kidneys: °· Remove wastes and extra water from the blood.   °· Produce important hormones. These help keep bones strong, regulate blood pressure, and help create red blood cells.   °· Balance the fluids and chemicals in the blood and tissues. °A small amount of kidney damage may not cause problems, but a large amount of damage may make it difficult or impossible for the kidneys to work the way they should. Acute kidney injury may develop into long-lasting (chronic) kidney disease. It may also develop into a life-threatening disease called end-stage kidney disease. Acute kidney injury can get worse very quickly, so it should be treated right away. Early treatment may prevent other kidney diseases from developing. °CAUSES  °· A problem with blood flow to the kidneys. This may be caused by:   °¨ Blood loss.   °¨ Heart disease.   °¨ Severe burns.   °¨ Liver disease. °· Direct damage to the kidneys. This may be caused by: °¨ Some medicines.   °¨ A kidney infection.   °¨ Poisoning or consuming toxic substances.   °¨ A surgical wound.   °¨ A blow to the kidney area.   °· A problem with urine flow. This may be caused by:   °¨ Cancer.   °¨ Kidney stones.   °¨ An enlarged prostate. °SIGNS AND SYMPTOMS  °· Swelling (edema) of the legs, ankles, or feet.   °· Tiredness (lethargy).   °· Nausea or vomiting.   °· Confusion.   °· Problems with urination, such as:   °¨ Painful or burning feeling during urination.   °¨ Decreased urine production.   °¨ Frequent accidents in children who are potty trained.   °¨ Bloody urine.   °· Muscle twitches and cramps.   °· Shortness of breath.   °· Seizures.   °· Chest  pain or pressure. °Sometimes, no symptoms are present.  °DIAGNOSIS °Acute kidney injury may be detected and diagnosed by tests, including blood, urine, imaging, or kidney biopsy tests.  °TREATMENT °Treatment of acute kidney injury varies depending on the cause and severity of the kidney damage. In mild cases, no treatment may be needed. The kidneys may heal on their own. If acute kidney injury is more severe, your health care provider will treat the cause of the kidney damage, help the kidneys heal, and prevent complications from occurring. Severe cases may require a procedure to remove toxic wastes from the body (dialysis) or surgery to repair kidney damage. Surgery may involve:  °· Repair of a torn kidney.   °· Removal of an obstruction. °HOME CARE INSTRUCTIONS °· Follow your prescribed diet. °· Take medicines only as directed by your health care provider.  °· Do not take any new medicines (prescription, over-the-counter, or nutritional supplements) unless approved by your health care provider. Many medicines can worsen your kidney damage or may need to have the dose adjusted.   °· Keep all follow-up visits as directed by your health care provider. This is important. °· Observe your condition to make sure you are healing as expected. °SEEK IMMEDIATE MEDICAL CARE IF: °· You are feeling ill or have severe pain in the back or side.   °· Your symptoms return or you have new symptoms. °· You have any symptoms of end-stage kidney disease. These include:   °¨ Persistent itchiness.   °¨   Loss of appetite.   °¨ Headaches.   °¨ Abnormally dark or light skin. °¨ Numbness in the hands or feet.   °¨ Easy bruising.   °¨ Frequent hiccups.   °¨ Menstruation stops.   °· You have a fever. °· You have increased urine production. °· You have pain or bleeding when urinating. °MAKE SURE YOU:  °· Understand these instructions. °· Will watch your condition. °· Will get help right away if you are not doing well or get worse. °  °This  information is not intended to replace advice given to you by your health care provider. Make sure you discuss any questions you have with your health care provider. °  °Document Released: 10/06/2010 Document Revised: 04/13/2014 Document Reviewed: 11/20/2011 °Elsevier Interactive Patient Education ©2016 [employer] ° °

## 2015-01-13 NOTE — ED Notes (Signed)
MD at bedside. 

## 2015-01-13 NOTE — ED Notes (Signed)
Per EMS: Pt began feeling weak ~ 1 week ago. Has experienced increased fatigue and feeling of a "racing heart" upon exertion. EMS walked patient to stretcher and noticed run of V-tach. Otherwise, NSR at rest. Hx HTN and stent placement in 2012. Pt denies any pain/discomfort/SOB. Just fatigue. BP 144/64, 70bpm, 98% RA.

## 2015-01-13 NOTE — ED Provider Notes (Signed)
CSN: [id number]     Arrival date & time 01/13/15  1545 History   First MD Initiated Contact with Patient 01/13/15 1601     Chief Complaint  Patient presents with  . Fatigue     (Consider location/radiation/quality/duration/timing/severity/associated sxs/prior Treatment) HPI Comments: 74 year old male with past medical history including CAD status post MI, hypertension, hyperlipidemia, GERD who presents with weakness. The patient states that he has had generalized weakness for the past one week. He has had several episodes where he felt like his heart was racing during exertion. He states that recently he has been having some sensations of his heart skipping a beat before returning to its normal rate. He feels slightly weak and lightheaded with walking but denies any syncope, vertigo, chest pain, nausea, vomiting, or diaphoresis. He thinks that he may be mildly short of breath during the heart racing sensation. He otherwise has been well recently with no cough/cold or abd pain.  The history is provided by the patient.    Past Medical History  Diagnosis Date  . Heart murmur   . Coronary artery disease   . Endocarditis   . Arthritis   . Hypertension   . GERD (gastroesophageal reflux disease)   . Myocardial infarction [facility])     POSSIBLE 2012 ELEVATED ENZYMES  . High cholesterol   . Pneumonia ~ 2011  . Migraines     "none in the last couple years" (12/24/2011)  . Chronic lower back pain     "last 3 months; usually when I bend" (12/24/2011)   Past Surgical History  Procedure Laterality Date  . Tee without cardioversion  11/06/2011    Procedure: TRANSESOPHAGEAL ECHOCARDIOGRAM (TEE);  Surgeon: Laverda Page, MD;  Location: [place];  Service: Cardiovascular;  Laterality: N/A;  . Colonoscopy  11/11/2011    Procedure: COLONOSCOPY;  Surgeon: Missy Sabins, MD;  Location: [place];  Service: Endoscopy;  Laterality: N/A;  . Coronary angioplasty with stent placement  11/11/2010    "1"  .  Cystectomy  ~ 1980    right eyelid  . Colon resection  12/25/2011    Procedure: COLON RESECTION LAPAROSCOPIC;  Surgeon: Shann Medal, MD;  Location: [place];  Service: General;  Laterality: Right;  laparoscopic assisted right colon resection   Family History  Problem Relation Age of Onset  . Cancer Neg Hx   . Diabetes Neg Hx   . Heart failure Neg Hx   . Hyperlipidemia Neg Hx   . Hypertension Neg Hx    Social History  Substance Use Topics  . Smoking status: Never Smoker   . Smokeless tobacco: Never Used     Comment: 12/24/2011 "used to puff cigarettes; never inhaled"  . Alcohol Use: 0.6 oz/week    1 Cans of beer per week    Review of Systems  10 Systems reviewed and are negative for acute change except as noted in the HPI.   Allergies  Brilinta and Penicillins  Home Medications   Prior to Admission medications   Medication Sig Start Date End Date Taking? Authorizing Provider  Arginine 500 MG CAPS Take 500 mg by mouth 2 (two) times daily.   Yes Historical Provider, MD  Ascorbic Acid (VITAMIN C) 500 MG tablet Take 500 mg by mouth 2 (two) times daily.     Yes Historical Provider, MD  b complex vitamins tablet Take 1 tablet by mouth daily.   Yes Historical Provider, MD  Coenzyme Q10 (EQL COQ10) 300 MG CAPS Take 1 capsule  by mouth every morning.    Yes Historical Provider, MD  diltiazem (CARDIZEM CD) 180 MG 24 hr capsule Take 180 mg by mouth 2 (two) times daily.   Yes Historical Provider, MD  Ginkgo Biloba 60 MG CAPS Take 30 mg by mouth 2 (two) times daily.    Yes Historical Provider, MD  glucosamine-chondroitin 500-400 MG tablet Take 1 tablet by mouth 2 (two) times daily.    Yes Historical Provider, MD  HAWTHORN BERRY PO Take 1 capsule by mouth every morning.    Yes Historical Provider, MD  Horse Chestnut 300 MG CAPS Take 1 capsule by mouth every morning.    Yes Historical Provider, MD  metoprolol succinate (TOPROL-XL) 25 MG 24 hr tablet Take 25 mg by mouth every morning.    Yes  Historical Provider, MD  Multiple Vitamin (MULTIVITAMIN WITH MINERALS) TABS Take 1 tablet by mouth every morning.    Yes Historical Provider, MD  Omega-3 Fatty Acids (FISH OIL) 500 MG CAPS Take 500 mg by mouth daily.   Yes Historical Provider, MD  OVER THE COUNTER MEDICATION Take 1,000 mg by mouth every morning. BILLBERRY CAPSULE 1000MG    Yes Historical Provider, MD  pravastatin (PRAVACHOL) 40 MG tablet Take 40 mg by mouth at bedtime.    Yes Historical Provider, MD  Probiotic Product (PROBIOTIC DAILY PO) Take 1 capsule by mouth every morning. Digestive care   Yes Historical Provider, MD  ranitidine (ZANTAC) 75 MG tablet Take 75 mg by mouth 2 (two) times daily.   Yes Historical Provider, MD  saw palmetto 160 MG capsule Take 160 mg by mouth every morning.    Yes Historical Provider, MD  vitamin E 400 UNIT capsule Take 400 Units by mouth daily.    Yes Historical Provider, MD  clindamycin (CLEOCIN) 300 MG capsule Take 1 capsule (300 mg total) by mouth 3 (three) times daily. 12/03/14   Waldemar Dickens, MD  mupirocin ointment (BACTROBAN) 2 % Apply 1 application topically 2 (two) times daily. 12/03/14   Waldemar Dickens, MD  sulfamethoxazole-trimethoprim (SEPTRA DS) 800-160 MG per tablet Take 1 tablet by mouth 2 (two) times daily. 11/11/12   Noland Fordyce, PA-C   BP 138/66 mmHg  Pulse 76  Resp 12  SpO2 98% Physical Exam  Constitutional: He is oriented to person, place, and time. He appears well-developed and well-nourished. No distress.  HENT:  Head: Normocephalic and atraumatic.  Moist mucous membranes  Eyes: Conjunctivae are normal. Pupils are equal, round, and reactive to light.  Neck: Neck supple.  Cardiovascular: Normal rate, regular rhythm and intact distal pulses.   4/6 systolic murmur  Pulmonary/Chest: Effort normal and breath sounds normal. No respiratory distress. He has no wheezes.  Abdominal: Soft. Bowel sounds are normal. He exhibits no distension. There is no tenderness.   Musculoskeletal: He exhibits no edema.  Neurological: He is alert and oriented to person, place, and time.  Fluent speech  Skin: Skin is warm and dry.  Psychiatric: He has a normal mood and affect. Judgment normal.  Nursing note and vitals reviewed.   ED Course  Procedures (including critical care time) Labs Review Labs Reviewed  COMPREHENSIVE METABOLIC PANEL - Abnormal; Notable for the following:    Chloride 100 (*)    Glucose, Bld 107 (*)    Creatinine, Ser 1.27 (*)    GFR calc non Af Amer 55 (*)    All other components within normal limits  CBC WITH DIFFERENTIAL/PLATELET - Abnormal; Notable for the following:  RBC 4.13 (*)    Hemoglobin 12.9 (*)    HCT 38.4 (*)    Platelets 138 (*)    All other components within normal limits  BRAIN NATRIURETIC PEPTIDE - Abnormal; Notable for the following:    B Natriuretic Peptide 205.0 (*)    All other components within normal limits  URINALYSIS, ROUTINE W REFLEX MICROSCOPIC (NOT AT [facility])  Randolm Idol, ED    Imaging Review Dg Chest 2 View  01/13/2015   CLINICAL DATA:  Shortness of breath.  EXAM: CHEST  2 VIEW  COMPARISON:  Chest x-rays dated 12/17/2011 and 11/06/2011  FINDINGS: The heart is at the upper limits normal. Pulmonary vascularity is normal and the lungs are clear. No effusions. No osseous abnormality.  IMPRESSION: No active cardiopulmonary disease.   Electronically Signed   By: Lorriane Shire M.D.   On: 01/13/2015 16:45   I have personally reviewed and evaluated these lab results as part of my medical decision-making.   EKG Interpretation   Date/Time:  Sunday January 13 2015 15:53:16 EDT Ventricular Rate:  70 PR Interval:  233 QRS Duration: 140 QT Interval:  428 QTC Calculation: 462 R Axis:   0 Text Interpretation:  Sinus rhythm Prolonged PR interval Left bundle  branch block No significant change since last tracing Confirmed by Tabitha Riggins  MD, Rocket Gunderson [phone]) on 01/13/2015 4:01:14 PM     Medications  sodium  chloride 0.9 % bolus 1,000 mL (1,000 mLs Intravenous New Bag/Given 01/13/15 2057)    MDM   Final diagnoses:  Weakness  AKI (acute kidney injury) ([place])  h/o CAD    74 year old male with history of CAD who presents with 1 week of generalized weakness as well as several episodes where he felt like his heart was skipping a beat and racing during exertion. On arrival, patient was awake, alert, comfortable and in no acute distress. Idol signs unremarkable. EKG shows sinus rhythm with no significant changes from previous. No evidence of AV block. Obtained above labs including troponin and BNP as well as chest x-ray.  Chest x-ray unremarkable. Labwork was notable only for mild AK I with creatinine of 1.27. Gave the patient an IV fluid bolus. Orthostatic vital signs prior to fluid bolus were negative. After receiving fluids, patient was ambulated in the emergency department while on pulse ox. His heart rate never rapidly increased and he never became symptomatic during ambulation. The patient has a follow-up appointment with his cardiologist scheduled for tomorrow. Based on his well appearance and reassuring workup, I feel that he is safe for discharge with close follow-up. I've informed him of his AK I need for continued hydration and recheck of his creatinine. Patient has voiced understanding of return precautions. Patient discharged in satisfactory condition.  Sharlett Iles, MD 01/13/15 [phone]

## 2015-01-14 ENCOUNTER — Encounter: Payer: Self-pay | Admitting: Internal Medicine

## 2015-01-14 DIAGNOSIS — I10 Essential (primary) hypertension: Secondary | ICD-10-CM | POA: Diagnosis not present

## 2015-01-14 DIAGNOSIS — R002 Palpitations: Secondary | ICD-10-CM | POA: Diagnosis not present

## 2015-01-14 DIAGNOSIS — I2510 Atherosclerotic heart disease of native coronary artery without angina pectoris: Secondary | ICD-10-CM | POA: Diagnosis not present

## 2015-01-14 DIAGNOSIS — I447 Left bundle-branch block, unspecified: Secondary | ICD-10-CM | POA: Diagnosis not present

## 2015-01-25 DIAGNOSIS — R002 Palpitations: Secondary | ICD-10-CM | POA: Diagnosis not present

## 2015-01-25 DIAGNOSIS — I2510 Atherosclerotic heart disease of native coronary artery without angina pectoris: Secondary | ICD-10-CM | POA: Diagnosis not present

## 2015-01-25 DIAGNOSIS — I447 Left bundle-branch block, unspecified: Secondary | ICD-10-CM | POA: Diagnosis not present

## 2015-01-25 DIAGNOSIS — R0602 Shortness of breath: Secondary | ICD-10-CM | POA: Diagnosis not present

## 2015-01-28 DIAGNOSIS — I2510 Atherosclerotic heart disease of native coronary artery without angina pectoris: Secondary | ICD-10-CM | POA: Diagnosis not present

## 2015-01-28 DIAGNOSIS — I447 Left bundle-branch block, unspecified: Secondary | ICD-10-CM | POA: Diagnosis not present

## 2015-01-28 DIAGNOSIS — I471 Supraventricular tachycardia: Secondary | ICD-10-CM | POA: Diagnosis not present

## 2015-01-28 DIAGNOSIS — R002 Palpitations: Secondary | ICD-10-CM | POA: Diagnosis not present

## 2015-01-29 ENCOUNTER — Encounter: Payer: Self-pay | Admitting: *Deleted

## 2015-01-31 ENCOUNTER — Encounter: Payer: Self-pay | Admitting: Internal Medicine

## 2015-01-31 ENCOUNTER — Ambulatory Visit (INDEPENDENT_AMBULATORY_CARE_PROVIDER_SITE_OTHER): Payer: Medicare Other | Admitting: Internal Medicine

## 2015-01-31 VITALS — BP 112/70 | HR 107 | Ht 72.0 in | Wt 208.2 lb

## 2015-01-31 DIAGNOSIS — I471 Supraventricular tachycardia, unspecified: Secondary | ICD-10-CM | POA: Insufficient documentation

## 2015-01-31 DIAGNOSIS — R55 Syncope and collapse: Secondary | ICD-10-CM | POA: Diagnosis not present

## 2015-01-31 NOTE — Progress Notes (Signed)
HPI [name] is referred by Dr. Einar [name] for evaluation of a wide QRS tachycardia. He is a 74 yo man with CAD, HTN, LVH wit LV outflow tract obstruction, and dyslipidemia. He has had an increased HR and ECG shows a LBBB tachycardia at 110/min. He has not had syncope. No chest pain but he has noted some dyspnea with exertion. 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 He has worn a cardiac monitor but we do not have results. Allergies  Allergen Reactions  . Brilinta [Ticagrelor] Itching and Rash  . Penicillins Rash     Current Outpatient Prescriptions  Medication Sig Dispense Refill  . Arginine 500 MG CAPS Take 500 mg by mouth 2 (two) times daily.    . Ascorbic Acid (VITAMIN C) 500 MG tablet Take 500 mg by mouth 2 (two) times daily.      Marland Kitchen b complex vitamins tablet Take 1 tablet by mouth daily.    . Coenzyme Q10 (EQL COQ10) 300 MG CAPS Take 1 capsule by mouth every morning.     . diltiazem (CARDIZEM CD) 180 MG 24 hr capsule Take 180 mg by mouth 2 (two) times daily.    . Ginkgo Biloba 60 MG TABS Take 1 tablet by mouth 2 (two) times daily.    Marland Kitchen glucosamine-chondroitin 500-400 MG tablet Take 1 tablet by mouth 2 (two) times daily.     Marland Kitchen HAWTHORN BERRY PO Take 1 capsule by mouth every morning.     . Horse Chestnut 300 MG CAPS Take 1 capsule by mouth every morning.     . metoprolol succinate (TOPROL-XL) 25 MG 24 hr tablet Take 50 mg by mouth daily.     . Multiple Vitamin (MULTIVITAMIN WITH MINERALS) TABS Take 1 tablet by mouth every morning.     . Omega-3 Fatty Acids (FISH OIL) 500 MG CAPS Take 500 mg by mouth daily.    Marland Kitchen OVER THE COUNTER MEDICATION Take 1,000 mg by mouth every morning. BILLBERRY CAPSULE 1000MG     . pravastatin (PRAVACHOL) 40 MG tablet Take 40 mg by mouth at bedtime.     . Probiotic Product (PROBIOTIC DAILY PO) Take 1 capsule by mouth every morning. Digestive care    . ranitidine (ZANTAC) 75 MG tablet Take 75 mg by mouth 2 (two) times daily.    . saw palmetto  160 MG capsule Take 160 mg by mouth every morning.     . vitamin E 400 UNIT capsule Take 400 Units by mouth daily.      No current facility-administered medications for this visit.     Past Medical History  Diagnosis Date  . Heart murmur   . Coronary artery disease   . Endocarditis   . Arthritis   . Hypertension   . GERD (gastroesophageal reflux disease)   . Myocardial infarction [facility])     POSSIBLE 2012 ELEVATED ENZYMES  . High cholesterol   . Pneumonia ~ 2011  . Migraines     "none in the last couple years" (12/24/2011)  . Chronic lower back pain     "last 3 months; usually when I bend" (12/24/2011)    ROS:   All systems reviewed and negative except as noted in the HPI.   Past Surgical History  Procedure Laterality Date  . Tee without cardioversion  11/06/2011    Procedure: TRANSESOPHAGEAL ECHOCARDIOGRAM (TEE);  Surgeon: Laverda Page, MD;  Location: [place];  Service: Cardiovascular;  Laterality: N/A;  . Colonoscopy  11/11/2011  Procedure: COLONOSCOPY;  Surgeon: Missy Sabins, MD;  Location: [place];  Service: Endoscopy;  Laterality: N/A;  . Coronary angioplasty with stent placement  11/11/2010    "1"  . Cystectomy  ~ 1980    right eyelid  . Colon resection  12/25/2011    Procedure: COLON RESECTION LAPAROSCOPIC;  Surgeon: Shann Medal, MD;  Location: [place];  Service: General;  Laterality: Right;  laparoscopic assisted right colon resection     Family History  Problem Relation Age of Onset  . Cancer Neg Hx   . Diabetes Neg Hx   . Heart failure Neg Hx   . Hyperlipidemia Neg Hx   . Sudden death Neg Hx   . Stroke Neg Hx   . Heart attack Neg Hx   . Hypertension Mother      Social History   Social History  . Marital Status: Married    Spouse Name: N/A  . Number of Children: N/A  . Years of Education: N/A   Occupational History  . Not on file.   Social History Main Topics  . Smoking status: Never Smoker   . Smokeless tobacco: Never Used      Comment: 12/24/2011 "used to puff cigarettes; never inhaled"  . Alcohol Use: 0.6 oz/week    1 Cans of beer per week  . Drug Use: No  . Sexual Activity: No   Other Topics Concern  . Not on file   Social History Narrative     BP 112/70 mmHg  Pulse 107  Ht 6' (1.829 m)  Wt 208 lb 3.2 oz (94.439 kg)  BMI 28.23 kg/m2  Physical Exam:  Well appearing NAD HEENT: Unremarkable Neck:  No JVD, no thyromegally Lymphatics:  No adenopathy Back:  No CVA tenderness Lungs:  Clear HEART:  Regular tachy with a prominent S4 gallop, no murmurs, no rubs, no clicks Abd:  soft, positive bowel sounds, no organomegally, no rebound, no guarding Ext:  2 plus pulses, no edema, no cyanosis, no clubbing Skin:  No rashes no nodules Neuro:  CN II through XII intact, motor grossly intact  EKG - probable atrial tachy at 108/min with LBBB.   Assess/Plan:

## 2015-01-31 NOTE — Assessment & Plan Note (Signed)
No recurrent episodes. Will follow. Previously his EF was normal but recently by stress testing appeared to be down. Will confirm.

## 2015-01-31 NOTE — Patient Instructions (Addendum)
Medication Instructions:  Your physician recommends that you continue on your current medications as directed. Please refer to the Current Medication list given to you today.  Labwork: None ordered  Testing/Procedures: None ordered  Follow-Up: Your physician recommends that you schedule a nurse visit appointment in: EKG in 1 week.  We will review Zio patch monitor results (once we receive them from Dr. Einar Gip) and determine follow up if needed.  Any Other Special Instructions Will Be Listed Below (If Applicable).  If you need a refill on your cardiac medications before your next appointment, please call your pharmacy.  Thank you for choosing [facility]!!

## 2015-01-31 NOTE — Assessment & Plan Note (Signed)
He appears to have a long RP wide QRS tachycardia which I think is atrial tachy with LBBB. The p wave morphology suggests a right atrial tachycardia. We did vagal maneuvers at the bedside while his ECG was on and this had no effect on the rhythm, neither did CSM.  I will review the cardiac monitor which Dr. Einar Gip has ordered and will ask him to undergo repeat ECG in a week. If he remains in tachycardia, then we will consider cathter ablation which would require CARTO.

## 2015-02-01 ENCOUNTER — Encounter: Payer: Self-pay | Admitting: Internal Medicine

## 2015-02-04 DIAGNOSIS — R002 Palpitations: Secondary | ICD-10-CM | POA: Diagnosis not present

## 2015-02-06 NOTE — Addendum Note (Signed)
Addended by: Freada Bergeron on: 02/06/2015 05:49 PM   Modules accepted: Orders

## 2015-02-07 ENCOUNTER — Ambulatory Visit (INDEPENDENT_AMBULATORY_CARE_PROVIDER_SITE_OTHER): Payer: Medicare Other | Admitting: *Deleted

## 2015-02-07 VITALS — BP 128/72 | HR 67 | Resp 16

## 2015-02-07 DIAGNOSIS — I471 Supraventricular tachycardia: Secondary | ICD-10-CM | POA: Diagnosis not present

## 2015-02-07 NOTE — Progress Notes (Signed)
1.) Reason for visit: EKG  2.) Name of MD requesting visit: Dr. [name]  3.) H&P: patient was seen 01/31/15 by Dr. [name] (referred by Dr. Einar Gip for eval of wide QRS tachycardia); rec repeat EKG today and if remains in tachycardia consider ablation.  4.) ROS related to problem: EKG done today reviewed by Dr. Curt Bears, HR is 67, sinus rhythm, 1st degree AV block, nonspecific intraventricular block.   Pt reports thatyesterday he picked up Eliquis at Dr. Irven Shelling office (5mg  BID), started last night. I asked if they mentioned his rhythm or the event monitor; he states "they said something about a flutter".  5.) Assessment and plan per MD: EKG reviewed by Dr. Curt Bears as above; will send this nurse visit to Dr. [name] for review/recommendation.

## 2015-02-11 ENCOUNTER — Telehealth: Payer: Self-pay | Admitting: Internal Medicine

## 2015-02-11 NOTE — Telephone Encounter (Signed)
New Message   Pt calling for his 4 week appt

## 2015-02-18 DIAGNOSIS — I4892 Unspecified atrial flutter: Secondary | ICD-10-CM | POA: Diagnosis not present

## 2015-02-18 DIAGNOSIS — I2510 Atherosclerotic heart disease of native coronary artery without angina pectoris: Secondary | ICD-10-CM | POA: Diagnosis not present

## 2015-02-18 DIAGNOSIS — I447 Left bundle-branch block, unspecified: Secondary | ICD-10-CM | POA: Diagnosis not present

## 2015-02-18 DIAGNOSIS — R002 Palpitations: Secondary | ICD-10-CM | POA: Diagnosis not present

## 2015-03-05 ENCOUNTER — Ambulatory Visit (INDEPENDENT_AMBULATORY_CARE_PROVIDER_SITE_OTHER): Payer: Medicare Other | Admitting: Internal Medicine

## 2015-03-05 ENCOUNTER — Encounter: Payer: Self-pay | Admitting: Internal Medicine

## 2015-03-05 VITALS — BP 100/60 | HR 69 | Ht 72.0 in | Wt 206.8 lb

## 2015-03-05 DIAGNOSIS — I421 Obstructive hypertrophic cardiomyopathy: Secondary | ICD-10-CM

## 2015-03-05 DIAGNOSIS — I10 Essential (primary) hypertension: Secondary | ICD-10-CM | POA: Diagnosis not present

## 2015-03-05 DIAGNOSIS — I471 Supraventricular tachycardia: Secondary | ICD-10-CM | POA: Diagnosis not present

## 2015-03-05 DIAGNOSIS — R55 Syncope and collapse: Secondary | ICD-10-CM | POA: Diagnosis not present

## 2015-03-05 NOTE — Assessment & Plan Note (Signed)
His symptoms are much improved. He will continue his current meds, avoid caffeine, and undergo watchful waiting.

## 2015-03-05 NOTE — Assessment & Plan Note (Signed)
His blood pressure is well controlled. No change in meds.  

## 2015-03-05 NOTE — Progress Notes (Signed)
HPI [name] returns today for followup. He is a 74 yo man with CAD, HTN, LVH with LV outflow tract obstruction, and dyslipidemia.  He has not had syncope. No chest pain but he has noted some dyspnea with exertion. 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 He has worn a cardiac monitor which shows a wide QRS tachy as well as daytime pauses. Since he cut back on his caffeine consumption, his heart racing and sob have improved. No syncope.  Allergies  Allergen Reactions  . Brilinta [Ticagrelor] Itching and Rash  . Penicillins Rash     Current Outpatient Prescriptions  Medication Sig Dispense Refill  . apixaban (ELIQUIS) 5 MG TABS tablet Take 5 mg by mouth 2 (two) times daily.    . Arginine 500 MG CAPS Take 500 mg by mouth 2 (two) times daily.    . Ascorbic Acid (VITAMIN C) 500 MG tablet Take 500 mg by mouth 2 (two) times daily.      Marland Kitchen b complex vitamins tablet Take 1 tablet by mouth daily.    . Coenzyme Q10 (EQL COQ10) 300 MG CAPS Take 1 capsule by mouth every morning.     . diltiazem (CARDIZEM CD) 180 MG 24 hr capsule Take 180 mg by mouth 2 (two) times daily.    . Ginkgo Biloba 60 MG TABS Take 1 tablet by mouth 2 (two) times daily.    Marland Kitchen glucosamine-chondroitin 500-400 MG tablet Take 1 tablet by mouth 2 (two) times daily.     Marland Kitchen HAWTHORN BERRY PO Take 1 capsule by mouth every morning.     . Horse Chestnut 300 MG CAPS Take 1 capsule by mouth every morning.     . metoprolol succinate (TOPROL-XL) 50 MG 24 hr tablet Take 1 tablet by mouth daily.    . Multiple Vitamin (MULTIVITAMIN WITH MINERALS) TABS Take 1 tablet by mouth every morning.     . Omega-3 Fatty Acids (FISH OIL) 500 MG CAPS Take 500 mg by mouth daily.    Marland Kitchen OVER THE COUNTER MEDICATION Take 1,000 mg by mouth every morning. BILLBERRY CAPSULE 1000MG     . pravastatin (PRAVACHOL) 40 MG tablet Take 40 mg by mouth at bedtime.     . Probiotic Product (PROBIOTIC DAILY PO) Take 1 capsule by mouth every morning. Digestive  care    . ranitidine (ZANTAC) 75 MG tablet Take 75 mg by mouth 2 (two) times daily.    . saw palmetto 160 MG capsule Take 160 mg by mouth 2 (two) times daily.     . vitamin E 400 UNIT capsule Take 400 Units by mouth daily.     Marland Kitchen VITAMIN K PO Take 1 capsule by mouth daily.     No current facility-administered medications for this visit.     Past Medical History  Diagnosis Date  . Heart murmur   . Coronary artery disease   . Endocarditis   . Arthritis   . Hypertension   . GERD (gastroesophageal reflux disease)   . Myocardial infarction [facility])     POSSIBLE 2012 ELEVATED ENZYMES  . High cholesterol   . Pneumonia ~ 2011  . Migraines     "none in the last couple years" (12/24/2011)  . Chronic lower back pain     "last 3 months; usually when I bend" (12/24/2011)    ROS:   All systems reviewed and negative except as noted in the HPI.   Past Surgical History  Procedure Laterality Date  .  Tee without cardioversion  11/06/2011    Procedure: TRANSESOPHAGEAL ECHOCARDIOGRAM (TEE);  Surgeon: Laverda Page, MD;  Location: [place];  Service: Cardiovascular;  Laterality: N/A;  . Colonoscopy  11/11/2011    Procedure: COLONOSCOPY;  Surgeon: Missy Sabins, MD;  Location: [place];  Service: Endoscopy;  Laterality: N/A;  . Coronary angioplasty with stent placement  11/11/2010    "1"  . Cystectomy  ~ 1980    right eyelid  . Colon resection  12/25/2011    Procedure: COLON RESECTION LAPAROSCOPIC;  Surgeon: Shann Medal, MD;  Location: [place];  Service: General;  Laterality: Right;  laparoscopic assisted right colon resection     Family History  Problem Relation Age of Onset  . Cancer Neg Hx   . Diabetes Neg Hx   . Heart failure Neg Hx   . Hyperlipidemia Neg Hx   . Sudden death Neg Hx   . Stroke Neg Hx   . Heart attack Neg Hx   . Hypertension Mother      Social History   Social History  . Marital Status: Married    Spouse Name: N/A  . Number of Children: N/A  . Years of  Education: N/A   Occupational History  . Not on file.   Social History Main Topics  . Smoking status: Never Smoker   . Smokeless tobacco: Never Used     Comment: 12/24/2011 "used to puff cigarettes; never inhaled"  . Alcohol Use: 0.6 oz/week    1 Cans of beer per week  . Drug Use: No  . Sexual Activity: No   Other Topics Concern  . Not on file   Social History Narrative     BP 100/60 mmHg  Pulse 69  Ht 6' (1.829 m)  Wt 206 lb 12.8 oz (93.804 kg)  BMI 28.04 kg/m2  Physical Exam:  Well appearing 74 yo man, NAD HEENT: Unremarkable Neck:  No JVD, no thyromegally Lymphatics:  No adenopathy Back:  No CVA tenderness Lungs:  Clear with no wheezes HEART:  Regular tachy with a prominent S4 gallop, no murmurs, no rubs, no clicks Abd:  soft, positive bowel sounds, no organomegally, no rebound, no guarding Ext:  2 plus pulses, no edema, no cyanosis, no clubbing Skin:  No rashes no nodules Neuro:  CN II through XII intact, motor grossly intact  EKG - NSR with LVH   Assess/Plan:

## 2015-03-05 NOTE — Patient Instructions (Signed)
Medication Instructions:  Your physician recommends that you continue on your current medications as directed. Please refer to the Current Medication list given to you today.   Labwork: None ordered   Testing/Procedures: Echo at Dr Irven Shelling office   Follow-Up: Your physician recommends that you schedule a follow-up appointment in: February with Dr [name]   Any Other Special Instructions Will Be Listed Below (If Applicable).     If you need a refill on your cardiac medications before your next appointment, please call your pharmacy.

## 2015-03-05 NOTE — Assessment & Plan Note (Signed)
He has had no recurrent symptoms. Will follow.

## 2015-03-05 NOTE — Assessment & Plan Note (Signed)
He previously had normal LV function but on stress test a month ago in the setting of his SVT demonstrated reduction of his LV function. I have recommended her undergo repeat echo in January. I will see him back in February and if his EF remains reduced, he will undergo ICD implant. If it is improved, he will undergo watchful waiting.

## 2015-03-15 ENCOUNTER — Telehealth: Payer: Self-pay | Admitting: Internal Medicine

## 2015-03-15 NOTE — Telephone Encounter (Signed)
New problem   Pt stated his pulse rate is 107 to 111 and need to speak to nurse.

## 2015-03-15 NOTE — Telephone Encounter (Signed)
Spoke with patient and let him know after reviewing with Dr [name] he should take an extra Metoprolol 50mg  now.  If this continues he can take an extra Metoprolol again tomorrow.  He says the last time he had this he took extra and it stopped after several days.  I let him know I would touch base with him again on Monday

## 2015-03-18 NOTE — Telephone Encounter (Signed)
Discussed with Dr [name] and patient.  Patient is not to kin on doing an ablation at this time.  I have asked him to come in on Friday 12/16 at 11:00 to discuss his options with Dr [name].  He will do that and continue to take the extra Metoprolol daily

## 2015-03-18 NOTE — Telephone Encounter (Signed)
F/u    Pt calling stating that he took the extra Metoprolol on Friday, Saturday, and Sunday and his pulse is still about the same. Pt wants to know if he needs to keep doing that or if he needs to change something. Please call back and advise.

## 2015-03-20 ENCOUNTER — Telehealth: Payer: Self-pay | Admitting: Internal Medicine

## 2015-03-20 ENCOUNTER — Encounter: Payer: Self-pay | Admitting: Internal Medicine

## 2015-03-20 DIAGNOSIS — I471 Supraventricular tachycardia: Secondary | ICD-10-CM | POA: Diagnosis not present

## 2015-03-20 NOTE — Telephone Encounter (Signed)
New Message (general)   Dr. Einar Gip has placed the patient on a 2 week heart monitor. So he canceled his appt for Friday. Pt request a call back to discuss if the data from the heart monitor is needed before the office visit.

## 2015-03-20 NOTE — Telephone Encounter (Signed)
Spoke with patient and after speaking with him Mon, he called Dr Irven Shelling office for advice.  He has had a heart monitor placed for 2 weeks and ws told he could take an additional 180mg  of Cardizem for his increased heart rates and not to let his HR be > 120.  He has a follow up with Dr Einar Gip on 12/27 and is to mail the monitor back on 12/28.  I let him know to follow with Dr Irven Shelling plan and call me after he sees him on 12/27.  I will cancel his appointment for Fri with Dr [name] as Dr Einar Gip has made recommendations for which he is doing.  He will call me after his follow up

## 2015-03-22 ENCOUNTER — Ambulatory Visit: Payer: Self-pay | Admitting: Internal Medicine

## 2015-04-02 DIAGNOSIS — R0602 Shortness of breath: Secondary | ICD-10-CM | POA: Diagnosis not present

## 2015-04-02 DIAGNOSIS — I2510 Atherosclerotic heart disease of native coronary artery without angina pectoris: Secondary | ICD-10-CM | POA: Diagnosis not present

## 2015-04-02 DIAGNOSIS — I471 Supraventricular tachycardia: Secondary | ICD-10-CM | POA: Diagnosis not present

## 2015-04-02 DIAGNOSIS — R002 Palpitations: Secondary | ICD-10-CM | POA: Diagnosis not present

## 2015-04-02 DIAGNOSIS — I4892 Unspecified atrial flutter: Secondary | ICD-10-CM | POA: Diagnosis not present

## 2015-04-16 ENCOUNTER — Encounter: Payer: Self-pay | Admitting: Internal Medicine

## 2015-04-16 ENCOUNTER — Ambulatory Visit (INDEPENDENT_AMBULATORY_CARE_PROVIDER_SITE_OTHER): Payer: Medicare Other | Admitting: Internal Medicine

## 2015-04-16 VITALS — BP 96/74 | HR 62 | Ht 72.0 in | Wt 205.2 lb

## 2015-04-16 DIAGNOSIS — I471 Supraventricular tachycardia: Secondary | ICD-10-CM | POA: Diagnosis not present

## 2015-04-16 DIAGNOSIS — I10 Essential (primary) hypertension: Secondary | ICD-10-CM | POA: Diagnosis not present

## 2015-04-16 NOTE — Assessment & Plan Note (Signed)
He has a long RP tachy and may also have atrial flutter. I have discussed catheter ablation and recommended this be done. He will call us. We would use CARTO.

## 2015-04-16 NOTE — Patient Instructions (Addendum)
Medication Instructions:  Your physician recommends that you continue on your current medications as directed. Please refer to the Current Medication list given to you today.   Labwork: None ordered   Testing/Procedures: Your physician has recommended that you have an ablation. Catheter ablation is a medical procedure used to treat some cardiac arrhythmias (irregular heartbeats). During catheter ablation, a long, thin, flexible tube is put into a blood vessel in your groin (upper thigh), or neck. This tube is called an ablation catheter. It is then guided to your heart through the blood vessel. Radio frequency waves destroy small areas of heart tissue where abnormal heartbeats may cause an arrhythmia to start. Please see the instruction sheet given to you today.(CARTO and Hold Eliqus 2 days)  Dates to do procedure: 1/30, 2/8,2/15, 2/20, 2/28, 3/8, 3/16, 3/24, 3/27  Call Janan Halter, RN if you decide to proceed  Follow-Up: Will be scheduled after ablation  Any Other Special Instructions Will Be Listed Below (If Applicable).     If you need a refill on your cardiac medications before your next appointment, please call your pharmacy.

## 2015-04-16 NOTE — Assessment & Plan Note (Signed)
His blood pressure is on the low side. Will follow.

## 2015-04-16 NOTE — Progress Notes (Signed)
HPI [name] returns today for followup. He is a 75 yo man with CAD, HTN, LVH with LV outflow tract obstruction, and dyslipidemia.  He has not had syncope. No chest pain but he has noted some dyspnea with exertion. 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 He has worn a cardiac monitor which shows a wide QRS tachy as well as daytime pauses. His heart racing had improved but he had 3 weeks beginning in late December, and back to NSR a week ago. He feels better. When he was out of rhythm, his HR was 110.   Allergies  Allergen Reactions  . Brilinta [Ticagrelor] Itching and Rash  . Penicillins Rash     Current Outpatient Prescriptions  Medication Sig Dispense Refill  . apixaban (ELIQUIS) 5 MG TABS tablet Take 5 mg by mouth 2 (two) times daily.    . Arginine 500 MG CAPS Take 500 mg by mouth 2 (two) times daily.    . Ascorbic Acid (VITAMIN C) 500 MG tablet Take 500 mg by mouth 2 (two) times daily.      Marland Kitchen b complex vitamins tablet Take 1 tablet by mouth daily.    . Coenzyme Q10 (EQL COQ10) 300 MG CAPS Take 1 capsule by mouth every morning.     . diltiazem (CARDIZEM CD) 180 MG 24 hr capsule Take 180 mg by mouth 2 (two) times daily.    . Ginkgo Biloba 60 MG TABS Take 1 tablet by mouth 2 (two) times daily.    Marland Kitchen glucosamine-chondroitin 500-400 MG tablet Take 1 tablet by mouth 2 (two) times daily.     Marland Kitchen HAWTHORN BERRY PO Take 1 capsule by mouth every morning.     . Horse Chestnut 300 MG CAPS Take 1 capsule by mouth every morning.     . metoprolol succinate (TOPROL-XL) 50 MG 24 hr tablet Take 1 tablet by mouth daily.    . Multiple Vitamin (MULTIVITAMIN WITH MINERALS) TABS Take 1 tablet by mouth every morning.     Marland Kitchen OVER THE COUNTER MEDICATION Take 1,000 mg by mouth every morning. BILLBERRY CAPSULE 1000MG     . pravastatin (PRAVACHOL) 40 MG tablet Take 40 mg by mouth at bedtime.     . Probiotic Product (PROBIOTIC DAILY PO) Take 1 capsule by mouth every morning. Digestive care    .  ranitidine (ZANTAC) 75 MG tablet Take 75 mg by mouth 2 (two) times daily.    . saw palmetto 160 MG capsule Take 160 mg by mouth 2 (two) times daily.     . vitamin E 400 UNIT capsule Take 400 Units by mouth daily.     Marland Kitchen VITAMIN K PO Take 1 capsule by mouth daily.     No current facility-administered medications for this visit.     Past Medical History  Diagnosis Date  . Heart murmur   . Coronary artery disease   . Endocarditis   . Arthritis   . Hypertension   . GERD (gastroesophageal reflux disease)   . Myocardial infarction [facility])     POSSIBLE 2012 ELEVATED ENZYMES  . High cholesterol   . Pneumonia ~ 2011  . Migraines     "none in the last couple years" (12/24/2011)  . Chronic lower back pain     "last 3 months; usually when I bend" (12/24/2011)    ROS:   All systems reviewed and negative except as noted in the HPI.   Past Surgical History  Procedure Laterality Date  .  Tee without cardioversion  11/06/2011    Procedure: TRANSESOPHAGEAL ECHOCARDIOGRAM (TEE);  Surgeon: Laverda Page, MD;  Location: [place];  Service: Cardiovascular;  Laterality: N/A;  . Colonoscopy  11/11/2011    Procedure: COLONOSCOPY;  Surgeon: Missy Sabins, MD;  Location: [place];  Service: Endoscopy;  Laterality: N/A;  . Coronary angioplasty with stent placement  11/11/2010    "1"  . Cystectomy  ~ 1980    right eyelid  . Colon resection  12/25/2011    Procedure: COLON RESECTION LAPAROSCOPIC;  Surgeon: Shann Medal, MD;  Location: [place];  Service: General;  Laterality: Right;  laparoscopic assisted right colon resection     Family History  Problem Relation Age of Onset  . Cancer Neg Hx   . Diabetes Neg Hx   . Heart failure Neg Hx   . Hyperlipidemia Neg Hx   . Sudden death Neg Hx   . Stroke Neg Hx   . Heart attack Neg Hx   . Hypertension Mother      Social History   Social History  . Marital Status: Married    Spouse Name: N/A  . Number of Children: N/A  . Years of Education:  N/A   Occupational History  . Not on file.   Social History Main Topics  . Smoking status: Never Smoker   . Smokeless tobacco: Never Used     Comment: 12/24/2011 "used to puff cigarettes; never inhaled"  . Alcohol Use: 0.6 oz/week    1 Cans of beer per week  . Drug Use: No  . Sexual Activity: No   Other Topics Concern  . Not on file   Social History Narrative     BP 96/74 mmHg  Pulse 62  Ht 6' (1.829 m)  Wt 205 lb 3.2 oz (93.078 kg)  BMI 27.82 kg/m2  Physical Exam:  Well appearing 75 yo man, NAD HEENT: Unremarkable Neck:  No JVD, no thyromegally Lymphatics:  No adenopathy Back:  No CVA tenderness Lungs:  Clear with no wheezes HEART:  Regular tachy with a prominent S4 gallop, no murmurs, no rubs, no clicks Abd:  soft, positive bowel sounds, no organomegally, no rebound, no guarding Ext:  2 plus pulses, no edema, no cyanosis, no clubbing Skin:  No rashes no nodules Neuro:  CN II through XII intact, motor grossly intact  EKG - NSR with LVH   Assess/Plan:

## 2015-05-23 ENCOUNTER — Ambulatory Visit: Payer: Self-pay | Admitting: Internal Medicine

## 2015-05-30 DIAGNOSIS — I10 Essential (primary) hypertension: Secondary | ICD-10-CM | POA: Diagnosis not present

## 2015-05-30 DIAGNOSIS — Z125 Encounter for screening for malignant neoplasm of prostate: Secondary | ICD-10-CM | POA: Diagnosis not present

## 2015-06-03 DIAGNOSIS — Q244 Congenital subaortic stenosis: Secondary | ICD-10-CM | POA: Diagnosis not present

## 2015-06-03 DIAGNOSIS — R0602 Shortness of breath: Secondary | ICD-10-CM | POA: Diagnosis not present

## 2015-06-03 DIAGNOSIS — R002 Palpitations: Secondary | ICD-10-CM | POA: Diagnosis not present

## 2015-06-03 DIAGNOSIS — I4892 Unspecified atrial flutter: Secondary | ICD-10-CM | POA: Diagnosis not present

## 2015-06-06 DIAGNOSIS — H6120 Impacted cerumen, unspecified ear: Secondary | ICD-10-CM | POA: Diagnosis not present

## 2015-06-06 DIAGNOSIS — I2510 Atherosclerotic heart disease of native coronary artery without angina pectoris: Secondary | ICD-10-CM | POA: Diagnosis not present

## 2015-06-06 DIAGNOSIS — E785 Hyperlipidemia, unspecified: Secondary | ICD-10-CM | POA: Diagnosis not present

## 2015-09-09 DIAGNOSIS — I2510 Atherosclerotic heart disease of native coronary artery without angina pectoris: Secondary | ICD-10-CM | POA: Diagnosis not present

## 2015-09-09 DIAGNOSIS — I422 Other hypertrophic cardiomyopathy: Secondary | ICD-10-CM | POA: Diagnosis not present

## 2015-09-09 DIAGNOSIS — Q244 Congenital subaortic stenosis: Secondary | ICD-10-CM | POA: Diagnosis not present

## 2015-09-09 DIAGNOSIS — I4892 Unspecified atrial flutter: Secondary | ICD-10-CM | POA: Diagnosis not present

## 2015-10-26 ENCOUNTER — Emergency Department (HOSPITAL_COMMUNITY)
Admission: EM | Admit: 2015-10-26 | Discharge: 2015-10-26 | Disposition: A | Discharge status: Home / Self Care | Payer: Medicare Other | Attending: Emergency Medicine | Admitting: Emergency Medicine

## 2015-10-26 ENCOUNTER — Emergency Department (HOSPITAL_COMMUNITY): Payer: Medicare Other

## 2015-10-26 ENCOUNTER — Encounter (HOSPITAL_COMMUNITY): Payer: Self-pay

## 2015-10-26 DIAGNOSIS — Z79899 Other long term (current) drug therapy: Secondary | ICD-10-CM | POA: Diagnosis not present

## 2015-10-26 DIAGNOSIS — Z955 Presence of coronary angioplasty implant and graft: Secondary | ICD-10-CM | POA: Diagnosis not present

## 2015-10-26 DIAGNOSIS — Z7901 Long term (current) use of anticoagulants: Secondary | ICD-10-CM | POA: Insufficient documentation

## 2015-10-26 DIAGNOSIS — I4891 Unspecified atrial fibrillation: Secondary | ICD-10-CM | POA: Diagnosis not present

## 2015-10-26 DIAGNOSIS — R404 Transient alteration of awareness: Secondary | ICD-10-CM | POA: Diagnosis not present

## 2015-10-26 DIAGNOSIS — I10 Essential (primary) hypertension: Secondary | ICD-10-CM | POA: Insufficient documentation

## 2015-10-26 DIAGNOSIS — I2510 Atherosclerotic heart disease of native coronary artery without angina pectoris: Secondary | ICD-10-CM | POA: Diagnosis not present

## 2015-10-26 DIAGNOSIS — I252 Old myocardial infarction: Secondary | ICD-10-CM | POA: Diagnosis not present

## 2015-10-26 DIAGNOSIS — R42 Dizziness and giddiness: Secondary | ICD-10-CM | POA: Diagnosis not present

## 2015-10-26 DIAGNOSIS — R0602 Shortness of breath: Secondary | ICD-10-CM | POA: Diagnosis not present

## 2015-10-26 LAB — CBC
HEMATOCRIT: 32.3 % — AB (ref 39.0–52.0)
Hemoglobin: 10.5 g/dL — ABNORMAL LOW (ref 13.0–17.0)
MCH: 29.8 pg (ref 26.0–34.0)
MCHC: 32.5 g/dL (ref 30.0–36.0)
MCV: 91.8 fL (ref 78.0–100.0)
PLATELETS: 129 10*3/uL — AB (ref 150–400)
RBC: 3.52 MIL/uL — ABNORMAL LOW (ref 4.22–5.81)
RDW: 15.5 % (ref 11.5–15.5)
WBC: 4.6 10*3/uL (ref 4.0–10.5)

## 2015-10-26 LAB — BASIC METABOLIC PANEL
Anion gap: 4 — ABNORMAL LOW (ref 5–15)
BUN: 18 mg/dL (ref 6–20)
CHLORIDE: 107 mmol/L (ref 101–111)
CO2: 28 mmol/L (ref 22–32)
CREATININE: 1.23 mg/dL (ref 0.61–1.24)
Calcium: 9.1 mg/dL (ref 8.9–10.3)
GFR calc Af Amer: >60 mL/min (ref >60)
GFR calc non Af Amer: 56 mL/min — ABNORMAL LOW (ref >60)
Glucose, Bld: 114 mg/dL — ABNORMAL HIGH (ref 65–99)
POTASSIUM: 4.6 mmol/L (ref 3.5–5.1)
Sodium: 139 mmol/L (ref 135–145)

## 2015-10-26 LAB — I-STAT TROPONIN, ED: TROPONIN I, POC: 0 ng/mL (ref 0.00–0.08)

## 2015-10-26 NOTE — ED Notes (Signed)
Per [facility], pt from home for dizziness feeling. Pt was outside all last week working on his lawnmower. Was also outside yesterday cleaning up. Has a hx of afib and felt more dizzy when he stands up and BP drops some. 20g to [place].

## 2015-10-26 NOTE — Discharge Instructions (Signed)
Please read and follow all provided instructions.  Your diagnoses today include:  1. Atrial fibrillation, new onset ([place])    Tests performed today include:  An EKG of your heart  A chest x-ray  Cardiac enzymes - a blood test for heart muscle damage  Blood counts and electrolytes  Vital signs. See below for your results today.   Medications prescribed:   Take any prescribed medications only as directed.  Follow-up instructions: Please follow-up with your Cardiologist (Dr. Einar Gip) as soon as you can for further evaluation of your symptoms. Call and make an appointment. Likely will see you on Tuesday.   Return instructions:  SEEK IMMEDIATE MEDICAL ATTENTION IF:  You have severe chest pain, especially if the pain is crushing or pressure-like and spreads to the arms, back, neck, or jaw, or if you have sweating, nausea (feeling sick to your stomach), or shortness of breath. THIS IS AN EMERGENCY. Don't wait to see if the pain will go away. Get medical help at once. Call 911 or [phone] (operator). DO NOT drive yourself to the hospital.   Your chest pain gets worse and does not go away with rest.   You have an attack of chest pain lasting longer than usual, despite rest and treatment with the medications your caregiver has prescribed.   You wake from sleep with chest pain or shortness of breath.  You feel dizzy or faint.  You have chest pain not typical of your usual pain for which you originally saw your caregiver.   You have any other emergent concerns regarding your health.  Additional Information: Chest pain comes from many different causes. Your caregiver has diagnosed you as having chest pain that is not specific for one problem, but does not require admission.  You are at low risk for an acute heart condition or other serious illness.   Your vital signs today were: BP 120/60 mmHg   Pulse 115   Temp(Src) 98.1 F (36.7 C) (Oral)   Resp 16   Ht 6' (1.829 m)   Wt 90.719 kg   BMI  27.12 kg/m2   SpO2 98% If your blood pressure (BP) was elevated above 135/85 this visit, please have this repeated by your doctor within one month. --------------

## 2015-10-26 NOTE — ED Provider Notes (Signed)
CSN: [id number]     Arrival date & time 10/26/15  1318 History   First MD Initiated Contact with Patient 10/26/15 1329     Chief Complaint  Patient presents with  . Atrial Fibrillation  . Dizziness   (Consider location/radiation/quality/duration/timing/severity/associated sxs/prior Treatment) HPI 75 y.o. male with a hx of CAD, HTN, LVH with LV outflow tract obstruction, dyslipidemia, and endocarditis, presents to the Emergency Department today complaining of dizziness this AM while sitting down for breakfast. Pt did not stand and become dizzy. States that he has been dizzy x 1 hour ago and brought in by EMS. Mild SOB. NO dyspnea upon exertion. No CP/ABD pain. No N/V/D. No headaches. No vision changes. Pt has been working outside for the past week mowing the lawn. Fluid intake minimal. No other symptoms noted.   Past Medical History  Diagnosis Date  . Heart murmur   . Coronary artery disease   . Endocarditis   . Arthritis   . Hypertension   . GERD (gastroesophageal reflux disease)   . Myocardial infarction [facility])     POSSIBLE 2012 ELEVATED ENZYMES  . High cholesterol   . Pneumonia ~ 2011  . Migraines     "none in the last couple years" (12/24/2011)  . Chronic lower back pain     "last 3 months; usually when I bend" (12/24/2011)   Past Surgical History  Procedure Laterality Date  . Tee without cardioversion  11/06/2011    Procedure: TRANSESOPHAGEAL ECHOCARDIOGRAM (TEE);  Surgeon: Laverda Page, MD;  Location: [place];  Service: Cardiovascular;  Laterality: N/A;  . Colonoscopy  11/11/2011    Procedure: COLONOSCOPY;  Surgeon: Missy Sabins, MD;  Location: [place];  Service: Endoscopy;  Laterality: N/A;  . Coronary angioplasty with stent placement  11/11/2010    "1"  . Cystectomy  ~ 1980    right eyelid  . Colon resection  12/25/2011    Procedure: COLON RESECTION LAPAROSCOPIC;  Surgeon: Shann Medal, MD;  Location: [place];  Service: General;  Laterality: Right;  laparoscopic  assisted right colon resection   Family History  Problem Relation Age of Onset  . Cancer Neg Hx   . Diabetes Neg Hx   . Heart failure Neg Hx   . Hyperlipidemia Neg Hx   . Sudden death Neg Hx   . Stroke Neg Hx   . Heart attack Neg Hx   . Hypertension Mother    Social History  Substance Use Topics  . Smoking status: Never Smoker   . Smokeless tobacco: Never Used     Comment: 12/24/2011 "used to puff cigarettes; never inhaled"  . Alcohol Use: 0.6 oz/week    1 Cans of beer per week    Review of Systems ROS reviewed and all are negative for acute change except as noted in the HPI.  Allergies  Brilinta and Penicillins  Home Medications   Prior to Admission medications   Medication Sig Start Date End Date Taking? Authorizing Provider  apixaban (ELIQUIS) 5 MG TABS tablet Take 5 mg by mouth 2 (two) times daily.    Historical Provider, MD  Arginine 500 MG CAPS Take 500 mg by mouth 2 (two) times daily.    Historical Provider, MD  Ascorbic Acid (VITAMIN C) 500 MG tablet Take 500 mg by mouth 2 (two) times daily.      Historical Provider, MD  b complex vitamins tablet Take 1 tablet by mouth daily.    Historical Provider, MD  Coenzyme Q10 (EQL  COQ10) 300 MG CAPS Take 1 capsule by mouth every morning.     Historical Provider, MD  diltiazem (CARDIZEM CD) 180 MG 24 hr capsule Take 180 mg by mouth 2 (two) times daily.    Historical Provider, MD  Ginkgo Biloba 60 MG TABS Take 1 tablet by mouth 2 (two) times daily.    Historical Provider, MD  glucosamine-chondroitin 500-400 MG tablet Take 1 tablet by mouth 2 (two) times daily.     Historical Provider, MD  HAWTHORN BERRY PO Take 1 capsule by mouth every morning.     Historical Provider, MD  Horse Chestnut 300 MG CAPS Take 1 capsule by mouth every morning.     Historical Provider, MD  metoprolol succinate (TOPROL-XL) 50 MG 24 hr tablet Take 1 tablet by mouth daily. 02/06/15   Historical Provider, MD  Multiple Vitamin (MULTIVITAMIN WITH MINERALS)  TABS Take 1 tablet by mouth every morning.     Historical Provider, MD  OVER THE COUNTER MEDICATION Take 1,000 mg by mouth every morning. BILLBERRY CAPSULE 1000MG     Historical Provider, MD  pravastatin (PRAVACHOL) 40 MG tablet Take 40 mg by mouth at bedtime.     Historical Provider, MD  Probiotic Product (PROBIOTIC DAILY PO) Take 1 capsule by mouth every morning. Digestive care    Historical Provider, MD  ranitidine (ZANTAC) 75 MG tablet Take 75 mg by mouth 2 (two) times daily.    Historical Provider, MD  saw palmetto 160 MG capsule Take 160 mg by mouth 2 (two) times daily.     Historical Provider, MD  vitamin E 400 UNIT capsule Take 400 Units by mouth daily.     Historical Provider, MD  VITAMIN K PO Take 1 capsule by mouth daily.    Historical Provider, MD   BP 120/60 mmHg  Pulse 115  Temp(Src) 98.1 F (36.7 C) (Oral)  Resp 16  Ht 6' (1.829 m)  Wt 90.719 kg  BMI 27.12 kg/m2  SpO2 98%   Physical Exam  Constitutional: He is oriented to person, place, and time. He appears well-developed and well-nourished.  HENT:  Head: Normocephalic and atraumatic.  Eyes: EOM are normal. Pupils are equal, round, and reactive to light.  Neck: Normal range of motion.  Cardiovascular: Normal rate, intact distal pulses and normal pulses.  An irregularly irregular rhythm present.  No murmur heard. Pulmonary/Chest: Effort normal and breath sounds normal. No respiratory distress. He has no wheezes. He has no rales. He exhibits no tenderness.  Abdominal: Soft.  Musculoskeletal: Normal range of motion.  Neurological: He is alert and oriented to person, place, and time. He has normal strength. No cranial nerve deficit or sensory deficit.  Cranial Nerves:  II: Pupils equal, round, reactive to light III,IV, VI: ptosis not present, extra-ocular motions intact bilaterally  V,VII: smile symmetric, facial light touch sensation equal VIII: hearing grossly normal bilaterally  IX,X: midline uvula rise  XI:  bilateral shoulder shrug equal and strong XII: midline tongue extension  Skin: Skin is warm and dry.  Psychiatric: He has a normal mood and affect. His behavior is normal. Thought content normal.  Nursing note and vitals reviewed.  ED Course  Procedures (including critical care time) Labs Review Labs Reviewed  CBC - Abnormal; Notable for the following:    RBC 3.52 (*)    Hemoglobin 10.5 (*)    HCT 32.3 (*)    Platelets 129 (*)    All other components within normal limits  BASIC METABOLIC PANEL - Abnormal; Notable  for the following:    Glucose, Bld 114 (*)    GFR calc non Af Amer 56 (*)    Anion gap 4 (*)    All other components within normal limits  I-STAT TROPOININ, ED   Imaging Review No results found. I have personally reviewed and evaluated these images and lab results as part of my medical decision-making.   EKG Interpretation   Date/Time:  Saturday October 26 2015 13:23:30 EDT Ventricular Rate:  81 PR Interval:    QRS Duration: 133 QT Interval:  414 QTC Calculation: 481 R Axis:   7 Text Interpretation:  Age not entered, assumed to be  75 years old for  purpose of ECG interpretation Atrial fibrillation Left bundle branch block  No significant change since last tracing Confirmed by [facility] MD, Corene Cornea  [phone]) on 10/26/2015 2:15:10 PM      MDM  I have reviewed and evaluated the relevant laboratory valuesI have reviewed and evaluated the relevant imaging studies. I have interpreted the relevant EKG.I have reviewed the relevant previous healthcare records.I have reviewed EMS Documentation.I obtained HPI from historian. Patient discussed with supervising physician  ED Course:  Assessment: Pt is a 31yM with hx CAD, HTN, LVH with LV outflow tract obstruction, dyslipidemia, and endocarditis who presents with dizziness since today while sitting at breakfast. Mild SOB. No dyspnea on exertion. On exam, pt in NAD. Nontoxic/nonseptic appearing. VSS. Afebrile. Lungs CTA. iStat Trop  negative CBC/BMP unremarkable. CXR unremarkable. EKG shows atrial fibrillation which is new onset. PT already on diltiazem and metoprolol for SVT as well as Eliquis. Consulted with Cardiology (Dr. Einar Gip) will see in the office next week. Plan is to DC home with follow up to Cardiology for possible outpatient cardioversion. Pt stable here at this time. At time of discharge, Patient is in no acute distress. Vital Signs are stable. Patient is able to ambulate. Patient able to tolerate PO.    Disposition/Plan:  DC Home Additional Verbal discharge instructions given and discussed with patient.  Pt Instructed to f/u with PCP in the next week for evaluation and treatment of symptoms. Return precautions given Pt acknowledges and agrees with plan  Supervising Physician Merrily Pew, MD   Final diagnoses:  Atrial fibrillation, new onset [facility])     Shary Decamp, PA-C 10/26/15 1628   Merrily Pew, MD 10/27/15 AN:[id number]

## 2015-10-26 NOTE — ED Provider Notes (Signed)
Medical screening examination/treatment/procedure(s) were conducted as a shared visit with non-physician practitioner(s) and myself.  I personally evaluated the patient during the encounter.  75 year old male history of hypertrophic obstructive cardiomyopathy, SVT on diltiazem and eliquis, and coronary artery disease presents immersed department today with episode of dizziness. Has improved on my exam. Did not have any chest pain, short of breath, nausea, vomiting or other symptoms. On exam patient has a heart rate from 60s to 80s and irregular. Lungs are relatively clear. And neuro exam is intact. Symptoms may be related to new onset atrial fibrillation however he is rate controlled at this time. He is also started on anticoagulation so we'll plan discussed with cardiology to get close follow-up for evaluation to see if patient needs to be cardioverted or not.      EKG Interpretation   Date/Time:  Saturday October 26 2015 13:23:30 EDT Ventricular Rate:  81 PR Interval:    QRS Duration: 133 QT Interval:  414 QTC Calculation: 481 R Axis:   7 Text Interpretation:  Age not entered, assumed to be  75 years old for  purpose of ECG interpretation Atrial fibrillation Left bundle branch block  No significant change since last tracing Confirmed by [facility] MD, Shanigua Gibb  [phone]) on 10/26/2015 2:15:10 PM        Merrily Pew, MD 10/27/15 AN:[id number]

## 2015-10-28 NOTE — H&P (Addendum)
OFFICE VISIT NOTES COPIED TO EPIC FOR DOCUMENTATION   History of Present Illness [name] K. Vyas MD; 09/09/2015 10:21 PM) Patient words: Last O/V 06/03/15; 3 mnth F/U for Parox. Atrial Flutter and CAD, swelling in ankles and feet.  The patient is a 75 year old male who presents with cardiomyopathy. and CAD. 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  Patient also has paroxysmal atrial flutter, he was evaluated by Dr. Crissie Sickles and was advised ablation. Due to social issues, wife being ill, patient did not schedule it and did not follow up. Fortunately he has not had any symptoms related to atrial flutter.   Patient denies any complaints of chest pain, tightness or pressure. No shortness of breath on walking on level ground. Patient feels short of breath on walking uphill or mowing the yard with a push mower etc. No orthopnea or PND. No complaints of palpitation, sudden fast or irregular heartbeat since the last visit. No complaints of dizziness, near-syncope or syncope. Swelling on the legs have slightly increased from before.  No history of hematuria, GI bleed or any other bleeding. No excessive bruising.  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 No abdominal discomfort, no bloody stools.  Problem List/Past Medical Anderson Malta Sergeant; 09/09/2015 4:12 PM) Atherosclerosis of native coronary artery of native heart without angina pectoris (I25.10)  Heart cath/PCI 11/11/10: Proximal and mid LAD stent 4.0x26 mm Baremetal integrity stent. Bare metal stent was used due to thrombocytopenia. Relatively normal circumflex and RCA. Lexiscan myoview stress test 01/25/2015: 1. The resting electrocardiogram demonstrated accelerated junctional rhythm @ 112bpm  and LBBB and secondary ST-T changes. Stress EKG is non-diagnostic for ischemia as it a pharmacologic stress using Lexiscan. Stress symptoms included dyspnea, dizziness.The stress test was terminated because of the end of the pharmacological testing. 2. The overall quality of the study is good. Left ventricular cavity is noted to be enlarged (140m) on the rest and stress studies. SPECT images demonstrate homogeneous tracer distribution throughout the myocardium. The left ventricular ejection fraction was calculated to be 21% with global hypokinesis. There is no reversible or fixed defect to suggest ischemia or scar. This represents a low risk study. Clinical correlation reommended. No significant change from Exercise sestamibi 09/18/11. Shortness of breath at rest (R06.02)  Lexiscan myoview stress test 01/25/2015: 1. The resting electrocardiogram demonstrated accelerated junctional rhythm @ 112bpm and LBBB and secondary ST-T changes. Stress EKG is non-diagnostic for ischemia as it a pharmacologic stress using Lexiscan. Stress symptoms included dyspnea, dizziness.The stress test was terminated because of the end of the pharmacological testing. 2. The overall quality of the study is good. Left ventricular cavity is noted to be enlarged (1667m on the rest and stress studies. SPECT images demonstrate homogeneous tracer distribution throughout the myocardium. The left ventricular ejection fraction was calculated to be 21% with global hypokinesis. There is no reversible or fixed defect to suggest ischemia or scar. This represents a low risk study. Clinical correlation reommended. 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 Left ventricle cavity is normal  in size. Severe concentric hypertrophy of the left ventricle. Normal global wall motion. Doppler evidence of grade II (pseudonormal) diastolic dysfunction. Calculated EF 56%. Findings consistent with hypertrophic cardiomyopathy. 2. Left atrial cavity is severely dilated. A small secundum atrial septal defect probably due to stretched out septum is probably present and has a left-to-right shunt. 3. aortic valve not well visualized and is probably trileaflet with mild regurgitation. Mild calcification of the aortic valve annulus. Mild aortic valve leaflet thickening with mild calcification. Mildly restricted aortic valve leaflets. Mild aortic valve stenosis. Most gradient appears to come from subaortic stenosis. Peak PG of 112 mm Hg and mean of 63 mm Hg. Although AVA calculated to be 0.56cm2, it appears to open well. 4. Mild calcification of the mitral valve annulus. Systolic anterior motion of the anterior mitral leaflet with posteriorly directed Moderate mitral regurgitation. 5. Mild to moderate tricuspid regurgitation. Moderate pulmonary hypertension, PASP 40 mm Hg. 6. Compared to the study done on 11/05/2011, no significant change, ASD was probably noted previously also. LBBB (left bundle branch block) (I44.7) 01/14/2015 Atrial flutter, paroxysmal (I48.92) 02/06/2015 Event Monitor 2 weeks 03/20/2015: A. Flutter with variable ventricular conduction. Max V- rate of 148/min. No symptoms reported. FollowsDr. Crissie Sickles and plans ablation. Episodic transmission Event Monitor 03/31/2015: Paroxysmal A. Flutter with RVR (HR 148/min). Zio Patch event monitor 2 weeks 01/14/2015: Paroxysmal A. Flutter. 3.5 second V- standstill 3 episodes 9am, asymptomatic. 2 episodes of 3 and 4 beat NSVT. CHA2DS2-VASc Score is 2 with yearly risk of stroke of 2.2 %.  Allergies Anderson Malta Sergeant; 10/04/15  4:12 PM) Brilinta *HEMATOLOGICAL AGENTS - MISC.*  Rash. Penicillin V Potassium *PENICILLINS*  Rash.  Family History Anderson Malta Sergeant; 04-Oct-2015 4:12 PM) Mother  Deceased. at age 30 from Old Age Father  Deceased. at age 29 from CHF Sister 3  Older; 1-Deceased from [place] Brother 1  Deceased. at age 58 from a Heart Conditions  Social History Anderson Malta Sergeant; 2015/10/04 4:12 PM) Current tobacco use  Never smoker. Non Drinker/No Alcohol Use  Marital status  Married. Number of Children  2. step-children Living Situation  Lives with spouse.  Past Surgical History Anderson Malta Sergeant; 10-04-15 4:12 PM) 11/04/10: Left and right heart cath: LVOT obstruction with 93 mm Hg PG. Mid LAD 85-90% stenosis. Hyperdynamic LVEF. D/W A. Jacelyn Grip, MD at [facility] Proceed with PCI LAD and evaluate the symptoms later with regards to LVOT obstruction.  Removed a Mass from Colon and removed 20% of Colon 12/25/2011  Medication History Anderson Malta Sergeant; October 04, 2015 4:19 PM) Cardizem CD (180MG Capsule ER 24HR, 1 (one) Capsu Capsule ER 24H Oral two times daily, Taken starting 08/10/2015) Active. Pravastatin Sodium (40MG Tablet, 1 (one) Tablet Oral daily in the evening, Taken starting 07/21/2015) Active. Toprol XL (25MG Tablet ER 24HR, 1 Tablet Oral daily after dinner, Taken starting 07/07/2015) Active. Eliquis (5MG Tablet, 1 (one) Tablet Oral two times daily, Taken starting 06/03/2015) Active. Aspirin (81MG Tablet, 2 Tablet Tablet Tablet Tablet Oral daily, Taken starting 02/20/2013) Active. Multiple Vitamin (1 (one) Oral daily) Active. Vitamin E (400UNIT Tablet, 1 Oral daily) Active. Vitamin C (500MG Tablet, 1 Oral two times daily) Active. Co Q10 (100MG Capsule, 1 Oral daily) Active. Saw Palmetto (1 Oral two times daily) Active. Potassium (99MG Tablet, 1 Oral daily) Active. Magnesium (250MG Tablet, 1 Oral two times daily) Active. Hawthorne Berry (450MG Capsule, 1 Oral two times daily)  Active. Horse Chestnut (300MG Capsule, 1 Oral daily) Active. Milk Thistle (250MG Capsule, 1 Oral two times daily) Active. Bilberry (150MG Capsule, 1  Oral daily) Active. Ranitidine Acid Reducer (180m 1 Oral two times daily) Specific dose unknown - Active. B Complex 50 (1 Oral daily) Specific dose unknown - Active. Vitamin K (Phytonadione) (1 Oral daily) Specific dose unknown - Active. Fanugreek (2 daily) Active. Medications Reconciled (Verbally with patient)  Diagnostic Studies History (Anderson MaltaSergeant; 09/09/2015 4:12 PM) Nuclear stress test in 1995 (normal).  Echo 11/05/11: Severe LVH with severe LVOT obstruction. Mod TR, Pul HTN. Mild to mod MR. No vegetation  Exercise sestamibi 09/18/11: LV dilatation with stress. EF 29%. No change from 10/03/10 (pre PTCA).  ECG 2/21/3: S. rhythm, 1st degree AVB. LVH with repol abnormality, CRO lat ischemia. No change from  Heart cath/PCI 11/11/10: Proximal and mid LAD stent 4.0x26 mm Baremetal integrity stent.  TEE 06/30/10: Calcified right coronary cusp. NO aortic stenosis. No vegetation. No mention of HOCM  Labwork  03/14/2014: HB 12.6/HCT 37.1. Platelet count 118, Total cholesterol 144, triglycerides 38, HDL 55, LDL 81. TSH normal, Serum glucose 101 mg, BUN 25, serum creatinine 1.4, eGFR 52 mL, CMP otherwise normal. TSH normal. 09/17/2011 CBC, CMP normal. Total cholesterol 148, triglycerides 47, HDL 60, LDL 79 Sleep Study 2011 Neg for Sleep Apnea Colonoscopy 2013 Found a Mass that was later surgically removed. Not Cancerous Echocardiogram 10/18/2014 1. Left ventricle cavity is normal in size. Severe concentric hypertrophy of the left ventricle. Normal global wall motion. Doppler evidence of grade II (pseudonormal) diastolic dysfunction. Calculated EF 56%. Findings consistent with hypertrophic cardiomyopathy. 2. Left atrial cavity is severely dilated. A small secundum atrial septal defect probably due to stretched out septum is probably present  and has a left-to-right shunt. 3. aortic valve not well visualized and is probably trileaflet with mild regurgitation. Mild calcification of the aortic valve annulus. Mild aortic valve leaflet thickening with mild calcification. Mildly restricted aortic valve leaflets. Mild aortic valve stenosis. Most gradient appears to come from subaortic stenosis. Peak PG of 112 mm Hg and mean of 63 mm Hg. Although AVA calculated to be 0.56cm2, it appears to open well. 4. Mild calcification of the mitral valve annulus. Systolic anterior motion of the anterior mitral leaflet with posteriorly directed Moderate mitral regurgitation. 5. Mild to moderate tricuspid regurgitation. Moderate pulmonary hypertension, PASP 40 mm Hg. 6. Compared to the study done on 11/05/2011, no significant change, ASD was probably noted previously also. Carotid Doppler 10/23/2014 Minimal soft plaque bilateral carotid arteries. No significant stenosis. Zio Patch Event Monitor 01/2015 2 weeks: Paroxysmal A. Flutter. 3.5 second V- standstill 3 episodes 9am, asymptomatic. 2 episodes of 3 and 4 beat NSVT, Nuclear stress test 01/25/2015 1. The resting electrocardiogram demonstrated accelerated junctional rhythm @ 112bpm and LBBB and secondary ST-T changes. Stress EKG is non-diagnostic for ischemia as it a pharmacologic stress using Lexiscan. Stress symptoms included dyspnea, dizziness.The stress test was terminated because of the end of the pharmacological testing. 2. The overall quality of the study is good. Left ventricular cavity is noted to be enlarged (1659m on the rest and stress studies. SPECT images demonstrate homogeneous tracer distribution throughout the myocardium. The left ventricular ejection fraction was calculated to be 21% with global hypokinesis. There is no reversible or fixed defect to suggest ischemia or scar. This represents a low risk study. Clinical correlation reommended.  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  TEE subaortic obstruction and has appointment to see S.Olin Piaor EP and Dr. VaLucianne Lei  Dam for ID. Dental evaluation OP by Dr. Elvina Mattes recommended by Dr. Enrique Sack.   Health Maintenance History Anderson Malta Sergeant; 09/09/2015 4:12 PM) Was in the hospital at [facility] for blood stream infection 2013  Review of Systems Despina Hick, MD; 09/09/2015 10:27 PM) General Present- Fatigue. Not Present- Fever and Night Sweats. Skin Not Present- Itching and Rash. HEENT Not Present- Headache. Respiratory Present- Decreased Exercise Tolerance and Difficulty Breathing on Exertion (improving). Cardiovascular Present- Edema and Murmur. Not Present- Claudications, Fainting, Orthopnea and Palpitations. Gastrointestinal Not Present- Abdominal Pain, Constipation, Diarrhea, Nausea and Vomiting. Musculoskeletal Not Present- Joint Swelling. Neurological Not Present- Headaches. Hematology Not Present- Blood Clots, Easy Bruising and Nose Bleed.  Vitals Anderson Malta Sergeant; 09/09/2015 4:22 PM) 09/09/2015 4:12 PM Weight: 204.31 lb Height: 72in Body Surface Area: 2.15 m Body Mass Index: 27.71 kg/m  Pulse: 71 (Regular)  P.OX: 98% (Room air) BP: 110/60 (Sitting, Left Arm, Standard)  Physical Exam [name] K. Vyas MD; 09/09/2015 10:27 PM) General Mental Status-Alert. General Appearance-Cooperative, Appears stated age, Not in acute distress. Orientation-Oriented X3. Build & Nutrition-Well built(overweight).  Head and Neck Thyroid Gland Characteristics - no palpable nodules, no palpable enlargement.  Chest and Lung Exam Palpation Tender - No chest wall tenderness. Auscultation Breath sounds - Clear.  Cardiovascular Inspection Jugular vein - Right - No Distention. Auscultation Rhythm - Regular. Heart Sounds - S1 WNL, S2 WNL and No gallop present. Murmurs &  Other Heart Sounds: Murmur - Location - Aortic Area and Apex. Timing - Mid-systolic. Grade - III/VI. Modifiers - Increased w/ standing and Decreased w/ squatting. Radiation - Carotids.  Abdomen Palpation/Percussion Normal exam - Non Tender and No hepatosplenomegaly. Auscultation Normal exam - Bowel sounds normal.  Peripheral Vascular Lower Extremity Inspection - Left - Varicose veins, No Pigmentation. Right - Varicose veins, No Pigmentation. Palpation - Edema - Bilateral - 1+ Pitting edema. Femoral pulse - Left - Normal. Right - Normal. Popliteal pulse - Left - Normal. Right - Normal. Dorsalis pedis pulse - Left - Normal. Right - Normal. Posterior tibial pulse - Left - Normal. Right - Normal. Carotid arteries - Bilateral-Harsh Bruit. Abdomen-No prominent abdominal aortic pulsation, No epigastric bruit.  Neurologic Motor-Grossly intact without any focal deficits.  Musculoskeletal Global Assessment Left Lower Extremity - normal range of motion without pain. Right Lower Extremity - normal range of motion without pain.    Assessment & Plan [name] K. Vyas MD; 09/09/2015 10:31 PM) Atrial flutter, paroxysmal (I48.92) Story: Event Monitor 2 weeks 03/20/2015: A. Flutter with variable ventricular conduction. Max V- rate of 148/min. No symptoms reported. FollowsDr. Crissie Sickles and plans ablation.  Episodic transmission Event Monitor 03/31/2015: Paroxysmal A. Flutter with RVR (HR 148/min).  Zio Patch event monitor 2 weeks 01/14/2015: Paroxysmal A. Flutter. 3.5 second V- standstill 3 episodes 9am, asymptomatic. 2 episodes of 3 and 4 beat NSVT.  CHA2DS2-VASc Score is 2 with yearly risk of stroke of 2.2 %. Impression: EKG /27/2016: SVT probable underlying rhythm atrial flutter with 3:1 conduction, with a ventricular rate of 103 bpm, left bundle branch block. No further analysis due to LBBB. No significant change from 01/28/2015.  EKG- 09/09/2015- NSR, PR at ULN, LA abnormality, LBBB Current  Plans Complete electrocardiogram (93000) Atherosclerosis of native coronary artery of native heart without angina pectoris (I25.10) Story: Heart cath/PCI 11/11/10: Proximal and mid LAD stent 4.0x26 mm Baremetal integrity stent. Bare metal stent was used due to thrombocytopenia. Relatively normal circumflex and RCA.  Lexiscan myoview stress test 01/25/2015: 1. The resting electrocardiogram demonstrated accelerated  junctional rhythm @ 112bpm and LBBB and secondary ST-T changes. Stress EKG is non-diagnostic for ischemia as it a pharmacologic stress using Lexiscan. Stress symptoms included dyspnea, dizziness.The stress test was terminated because of the end of the pharmacological testing. 2. The overall quality of the study is good. Left ventricular cavity is noted to be enlarged (167m) on the rest and stress studies. SPECT images demonstrate homogeneous tracer distribution throughout the myocardium. The left ventricular ejection fraction was calculated to be 21% with global hypokinesis. There is no reversible or fixed defect to suggest ischemia or scar. This represents a low risk study. Clinical correlation reommended. No significant change from Exercise sestamibi 09/18/11. 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 Left ventricle cavity is normal in size. Severe concentric hypertrophy of the left ventricle. Normal global wall motion. Doppler evidence of grade II (pseudonormal) diastolic dysfunction. Calculated EF 56%. Findings consistent with hypertrophic cardiomyopathy. 2. Left atrial cavity is severely dilated. A small secundum atrial septal defect probably due to stretched out septum is probably present and has a left-to-right shunt. 3. aortic valve not well visualized and is probably trileaflet with mild regurgitation. Mild calcification of the aortic valve annulus. Mild aortic valve leaflet thickening with mild calcification. Mildly restricted aortic valve leaflets. Mild  aortic valve stenosis. Most gradient appears to come from subaortic stenosis. Peak PG of 112 mm Hg and mean of 63 mm Hg. Although AVA calculated to be 0.56cm2, it appears to open well. 4. Mild calcification of the mitral valve annulus. Systolic anterior motion of the anterior mitral leaflet with posteriorly directed Moderate mitral regurgitation. 5. Mild to moderate tricuspid regurgitation. Moderate pulmonary hypertension, PASP 40 mm Hg. 6. Compared to the study done on 11/05/2011, no significant change, ASD was probably noted previously also. Hypertrophic cardiomyopathy (I42.2)  Note:EKG- NSR, PR at ULN, LA abnormality, LBBB  Plan:  Patient remains stable symptomatically. He does not have any angina. No symptoms related to atrial flutter since the last visit. He is currently in sinus rhythm. His blood pressure is well controlled. Patient has mild swelling on the legs and feet, it appears to be predominantly due to varicosities, may be partly due to side effects of Cardizem therapy.  He was advised to continue all the present medications. The patient was advised to raise the legs while lying down and wear compression stockings. I have not advised diuretics because of severe hypertrophic cardiomyopathy with HOCM like physiology.  Patient was again advised to have follow-up with Dr. TLovena Leto consider ablation for paroxysmal atrial flutter.  Secondary prevention was discussed. He was advised to follow low-salt, low-cholesterol diet and walk regularly as tolerated.  He will return for follow-up with Dr. GEinar Gipafter 3 months but call uKoreaearlier if there are any cardiac problems.  CC: Dr. JJani Gravel  Signed electronically by CDespina Hick MD (09/09/2015 10:34 PM)  Addendum: Patient presented to the ED on 10/28/15 with generalized weakness and dyspnea and found to be in A. Fib with controlled ventricular response.  Patient has severe reviewed the obstruction, severe diastolic dysfunction,needs  atrial kick, hence we discussed over the telephone that he will need Direct current cardioversion To improve his symptoms and to maintain sinus rhythm.  He is also encouraged to meet and discuss with Dr. GCrissie Sickles now he may need  Evaluation for atrial fibrillation ablation. Patient is agreeable to proceed with direct current cardioversion, is aware of less than 1% risk of aspiration pneumonia, need for CPR, Traumatic intubation  but  not limited to these.  Patient is on long-term anticoagulation.  I have seen the patient this morning pre procedure, discussed the plans and no change in symptoms, continues to report fatigue. Will proceed with DCCV.  Adrian Prows, MD 10/28/2015, 5:07 PM [facility] [place] Pager: [phone] Office: [phone] If no answer: Cell:  [phone]

## 2015-10-29 ENCOUNTER — Encounter (HOSPITAL_COMMUNITY)
Admission: RE | Disposition: A | Discharge status: Home / Self Care | Payer: Self-pay | Source: Ambulatory Visit | Attending: Cardiology

## 2015-10-29 ENCOUNTER — Encounter (HOSPITAL_COMMUNITY): Payer: Self-pay

## 2015-10-29 ENCOUNTER — Ambulatory Visit (HOSPITAL_COMMUNITY): Payer: Medicare Other | Admitting: Certified Registered Nurse Anesthetist

## 2015-10-29 ENCOUNTER — Ambulatory Visit (HOSPITAL_COMMUNITY)
Admission: RE | Admit: 2015-10-29 | Discharge: 2015-10-29 | Disposition: A | Discharge status: Home / Self Care | Payer: Medicare Other | Source: Ambulatory Visit | Attending: Cardiology | Admitting: Cardiology

## 2015-10-29 DIAGNOSIS — M1990 Unspecified osteoarthritis, unspecified site: Secondary | ICD-10-CM | POA: Diagnosis not present

## 2015-10-29 DIAGNOSIS — I10 Essential (primary) hypertension: Secondary | ICD-10-CM | POA: Insufficient documentation

## 2015-10-29 DIAGNOSIS — I2510 Atherosclerotic heart disease of native coronary artery without angina pectoris: Secondary | ICD-10-CM | POA: Diagnosis not present

## 2015-10-29 DIAGNOSIS — Z8249 Family history of ischemic heart disease and other diseases of the circulatory system: Secondary | ICD-10-CM | POA: Diagnosis not present

## 2015-10-29 DIAGNOSIS — I080 Rheumatic disorders of both mitral and aortic valves: Secondary | ICD-10-CM | POA: Insufficient documentation

## 2015-10-29 DIAGNOSIS — I4892 Unspecified atrial flutter: Secondary | ICD-10-CM | POA: Insufficient documentation

## 2015-10-29 DIAGNOSIS — I252 Old myocardial infarction: Secondary | ICD-10-CM | POA: Insufficient documentation

## 2015-10-29 DIAGNOSIS — Z7901 Long term (current) use of anticoagulants: Secondary | ICD-10-CM | POA: Diagnosis not present

## 2015-10-29 DIAGNOSIS — I129 Hypertensive chronic kidney disease with stage 1 through stage 4 chronic kidney disease, or unspecified chronic kidney disease: Secondary | ICD-10-CM | POA: Diagnosis not present

## 2015-10-29 DIAGNOSIS — I481 Persistent atrial fibrillation: Secondary | ICD-10-CM | POA: Diagnosis not present

## 2015-10-29 DIAGNOSIS — N189 Chronic kidney disease, unspecified: Secondary | ICD-10-CM | POA: Diagnosis not present

## 2015-10-29 DIAGNOSIS — I4891 Unspecified atrial fibrillation: Secondary | ICD-10-CM | POA: Diagnosis not present

## 2015-10-29 HISTORY — PX: CARDIOVERSION: SHX1299

## 2015-10-29 SURGERY — CARDIOVERSION
Anesthesia: Monitor Anesthesia Care

## 2015-10-29 MED ORDER — PHENYLEPHRINE HCL 10 MG/ML IJ SOLN
INTRAMUSCULAR | Status: DC | PRN
  Administered 2015-10-29: 120 ug via INTRAVENOUS

## 2015-10-29 MED ORDER — PROPOFOL 10 MG/ML IV BOLUS
INTRAVENOUS | Status: DC | PRN
  Administered 2015-10-29: 40 mg via INTRAVENOUS

## 2015-10-29 MED ORDER — SODIUM CHLORIDE 0.9 % IV SOLN
INTRAVENOUS | Status: DC
  Administered 2015-10-29: 500 mL via INTRAVENOUS
  Administered 2015-10-29: 09:00:00 via INTRAVENOUS

## 2015-10-29 NOTE — Transfer of Care (Addendum)
Immediate Anesthesia Transfer of Care Note  Patient: [name]  Procedure(s) Performed: Procedure(s): CARDIOVERSION (N/A)  Patient Location: endoscopy unit  Anesthesia Type:General  Level of Consciousness: oriented and patient cooperative, drowsy  Airway & Oxygen Therapy: Patient Spontanous Breathing  Post-op Assessment: Report given to RN and Post -op Vital signs reviewed and stable  Post vital signs: Reviewed and stable  Last Vitals:  Vitals:   10/29/15 0746  BP: (!) 116/50  Pulse: 90  Resp: (!) 9    Last Pain:  Vitals:   10/29/15 0746  TempSrc: Oral         Complications: No apparent anesthesia complications

## 2015-10-29 NOTE — Consult Note (Signed)
ELECTROPHYSIOLOGY CONSULT NOTE    Patient ID: [name] MRN: [id number], DOB/AGE: 73-Jul-1942 73 y.o.  Admit date: 10/29/2015 Date of Consult: 10/29/2015  Primary Physician: [name], MD Primary Cardiologist: Einar Gip Electrophysiologist: [name]  Reason for Consultation: atrial arrhythmias  HPI:  [name] is a 75 y.o. male with a past medical history significant for hypertrophic cardiomyopathy, CAD, hypertension, hyperlipidemia, arthritis, atrial flutter, long RP tachycardia, and now atrial fibrillation. He was evaluated by Dr [name] in 2016 for possible flutter and SVT ablation but his wife was having health problems at that time and he did not want to proceed with procedure. He has done well since then but developed atrial fibrillation on Saturday with associated dizziness and went to the ER for evaluation. He was discharged with planned cardioversion today.  EP has been asked to evaluate for AAD treatment options.  Last echo 02/2015 demonstrated normal LV function, severe LVH, LA 58.   He currently denies chest pain, shortness of breath, LE edema, recent fevers, chills, nausea or vomiting.    Past Medical History:  Diagnosis Date  . Arthritis   . Chronic lower back pain    "last 3 months; usually when I bend" (12/24/2011)  . Coronary artery disease   . Endocarditis   . GERD (gastroesophageal reflux disease)   . Heart murmur   . High cholesterol   . Hypertension   . Migraines    "none in the last couple years" (12/24/2011)  . Myocardial infarction [facility])    POSSIBLE 2012 ELEVATED ENZYMES  . Pneumonia ~ 2011     Surgical History:  Past Surgical History:  Procedure Laterality Date  . COLON RESECTION  12/25/2011   Procedure: COLON RESECTION LAPAROSCOPIC;  Surgeon: Shann Medal, MD;  Location: [place];  Service: General;  Laterality: Right;  laparoscopic assisted right colon resection  . COLONOSCOPY  11/11/2011   Procedure: COLONOSCOPY;  Surgeon: Missy Sabins,  MD;  Location: [place];  Service: Endoscopy;  Laterality: N/A;  . CORONARY ANGIOPLASTY WITH STENT PLACEMENT  11/11/2010   "1"  . CYSTECTOMY  ~ 1980   right eyelid  . TEE WITHOUT CARDIOVERSION  11/06/2011   Procedure: TRANSESOPHAGEAL ECHOCARDIOGRAM (TEE);  Surgeon: Laverda Page, MD;  Location: [place];  Service: Cardiovascular;  Laterality: N/A;     Prescriptions Prior to Admission  Medication Sig Dispense Refill Last Dose  . apixaban (ELIQUIS) 5 MG TABS tablet Take 5 mg by mouth 2 (two) times daily.   10/29/2015 at Unknown time  . Arginine 500 MG CAPS Take 500 mg by mouth 2 (two) times daily.   10/28/2015 at Unknown time  . Ascorbic Acid (VITAMIN C) 500 MG tablet Take 500 mg by mouth 2 (two) times daily.     10/28/2015 at Unknown time  . aspirin 81 MG tablet Take 81 mg by mouth daily.   10/29/2015 at Unknown time  . b complex vitamins tablet Take 1 tablet by mouth daily.   10/28/2015 at Unknown time  . Cholecalciferol (VITAMIN D3 PO) Take 1 capsule by mouth daily.   10/28/2015 at Unknown time  . Coenzyme Q10 (EQL COQ10) 300 MG CAPS Take 1 capsule by mouth every morning.    10/28/2015 at Unknown time  . diltiazem (CARDIZEM CD) 180 MG 24 hr capsule Take 180 mg by mouth 2 (two) times daily.   10/29/2015 at Unknown time  . FENUGREEK PO Take 2 capsules by mouth daily.   10/28/2015 at Unknown time  .  Ginkgo Biloba 60 MG TABS Take 1 tablet by mouth 2 (two) times daily.   10/28/2015 at Unknown time  . glucosamine-chondroitin 500-400 MG tablet Take 1 tablet by mouth 2 (two) times daily.    10/28/2015 at Unknown time  . HAWTHORN BERRY PO Take 1 capsule by mouth every morning.    10/28/2015 at Unknown time  . Horse Chestnut 300 MG CAPS Take 1 capsule by mouth every morning.    10/28/2015 at Unknown time  . Magnesium 250 MG TABS Take by mouth daily.   10/28/2015 at Unknown time  . metoprolol succinate (TOPROL-XL) 50 MG 24 hr tablet Take 1 tablet by mouth daily.   10/28/2015 at Unknown time  . Multiple  Vitamin (MULTIVITAMIN WITH MINERALS) TABS Take 1 tablet by mouth every morning.    10/28/2015 at Unknown time  . OVER THE COUNTER MEDICATION Take 1,000 mg by mouth every morning. BILLBERRY CAPSULE 1000MG    10/28/2015 at Unknown time  . Potassium (POTASSIMIN PO) Take 1 tablet by mouth daily.   10/28/2015 at Unknown time  . pravastatin (PRAVACHOL) 40 MG tablet Take 40 mg by mouth at bedtime.    10/28/2015 at Unknown time  . Probiotic Product (PROBIOTIC DAILY PO) Take 1 capsule by mouth every morning. Digestive care   10/28/2015 at Unknown time  . ranitidine (ZANTAC) 75 MG tablet Take 75 mg by mouth 2 (two) times daily.   10/28/2015 at Unknown time  . RESVERATROL PO Take 1 capsule by mouth daily.   10/28/2015 at Unknown time  . saw palmetto 160 MG capsule Take 160 mg by mouth 2 (two) times daily.    10/28/2015 at Unknown time  . vitamin E 400 UNIT capsule Take 400 Units by mouth daily.    10/28/2015 at Unknown time  . VITAMIN K PO Take 1 capsule by mouth daily.   10/28/2015 at Unknown time    Inpatient Medications:    Allergies:  Allergies  Allergen Reactions  . Brilinta [Ticagrelor] Itching and Rash  . Penicillins Rash    Has patient had a PCN reaction causing immediate rash, facial/tongue/throat swelling, SOB or lightheadedness with hypotension: Yes Has patient had a PCN reaction causing severe rash involving mucus membranes or skin necrosis: Yes Has patient had a PCN reaction that required hospitalization No Has patient had a PCN reaction occurring within the last 10 years: No If all of the above answers are "NO", then may proceed with Cephalosporin use.     Social History   Social History  . Marital status: Married    Spouse name: N/A  . Number of children: N/A  . Years of education: N/A   Occupational History  . Not on file.   Social History Main Topics  . Smoking status: Never Smoker  . Smokeless tobacco: Never Used     Comment: 12/24/2011 "used to puff cigarettes; never inhaled"  .  Alcohol use 0.6 oz/week    1 Cans of beer per week  . Drug use: No  . Sexual activity: No   Other Topics Concern  . Not on file   Social History Narrative  . No narrative on file     Family History  Problem Relation Age of Onset  . Hypertension Mother   . Cancer Neg Hx   . Diabetes Neg Hx   . Heart failure Neg Hx   . Hyperlipidemia Neg Hx   . Sudden death Neg Hx   . Stroke Neg Hx   . Heart attack Neg  Hx      Review of Systems: All other systems reviewed and are otherwise negative except as noted above.  Physical Exam: Vitals:   10/29/15 0746  BP: (!) 116/50  Pulse: 90  Resp: (!) 9  TempSrc: Oral  SpO2: 98%  Weight: 200 lb (90.7 kg)  Height: 6' (1.829 m)    GEN- The patient is elderly appearing, alert and oriented x 3 today.   HEENT: normocephalic, atraumatic; sclera clear, conjunctiva pink; hearing intact; oropharynx clear; neck supple  Lungs- Clear to ausculation bilaterally, normal work of breathing.  No wheezes, rales, rhonchi Heart- Regular rate and rhythm, 3/6 systolic murmur GI- soft, non-tender, non-distended, bowel sounds present  Extremities- no clubbing, cyanosis, or edema; DP/PT/radial pulses 2+ bilaterally MS- no significant deformity or atrophy Skin- warm and dry, no rash or lesion Psych- euthymic mood, full affect Neuro- strength and sensation are intact  Labs:   Lab Results  Component Value Date   WBC 4.6 10/26/2015   HGB 10.5 (L) 10/26/2015   HCT 32.3 (L) 10/26/2015   MCV 91.8 10/26/2015   PLT 129 (L) 10/26/2015    Recent Labs Lab 10/26/15 1414  NA 139  K 4.6  CL 107  CO2 28  BUN 18  CREATININE 1.23  CALCIUM 9.1  GLUCOSE 114*      Radiology/Studies: Dg Chest 2 View Result Date: 10/26/2015 CLINICAL DATA:  Patient with shortness of breath. Irregular heartbeat. EXAM: CHEST  2 VIEW COMPARISON:  Chest radiograph 01/13/2015. FINDINGS: Stable enlarged cardiac and mediastinal contours. No consolidative pulmonary opacities. No pleural  effusion or pneumothorax. Regional skeleton is unremarkable. IMPRESSION: No active cardiopulmonary disease. Electronically Signed   By: Lovey Newcomer M.D.   On: 10/26/2015 15:26    [id number] fibrillation, LBBB, QRS 150, QTc 481 Prior EKG in SR with LBBB, 1st degree AV block, QTc 446   Assessment/Plan: 1.  Paroxysmal atrial fibrillation/atrial flutter/long RP tachycardia He has multiple atrial arrhythmias with a severely enlarged LA I worry about our ability to maintain SR long term With HCM, he will not do well in atrial fibrillation long term  His QTc when accounting for LBBB is acceptable for Tikosyn.  He does have baseline conduction system disease and so amiodarone may not be best option, but with LA size, would use if he fails Tikosyn His wife is recovering from cataract surgery and he is not able to do [place] admission today Continue Eliquis for CHADS2VASC of 3  2.  Hypertrophic cardiomyopathy Last echo 02/2015 in EPIC Consider updating once in SR (potentially during upcoming [place] admission)  3.  HTN Stable No change required today  Dr [name] to see later today  Chanetta Marshall, NP 10/29/2015 9:22 AM  EP Attending  Patient seen and examined. Agree with the findings as noted above with minimal modification. The patient has significant conduction system disease, HCM, and now atrial fib (previously atrial flutter and a long RP tachycardia). Agree that keeping him in NSR will be difficult. His LA size makes catheter ablation low likelihood for success. Exam reveals a well appearing man NAD, Lungs are clear, CV reveals a RRR with 3/6 systolic murmur. Ext without edema. Neuro is non-focal though the patient a little sleepy after sedation. ECG is correctly interpreted above.  A/P 1. Atrial fib in setting of multiple other SVT's and LA size of 58. He Is s/p DCCV and is currently maintaining NSR. His QTC is on long side but acceptable risk for Tikosyn initiation, in light of alternative  for maintenance of NSR, specifically amio which would increase likelihood for need for PPM. He will be brought back in for initiation of Tikosyn in a few weeks. 2. HTN - he will continue his current meds. 3. HCM - he has a significant gradient at rest, worse with exertion. Continue his current meds.  Mikle Bosworth.D.

## 2015-10-29 NOTE — CV Procedure (Signed)
Direct current cardioversion:  Indication symptomatic A. Fibrillation.  Procedure: Using 40 mg of IV Propofol and  IV phenylephrine 120 mcg due to underlyuing LVOT obstruction and lborderline low BP, achieved  deep sedation, synchronized direct current cardioversion performed. Patient was delivered with 100 then 50 Joules of electricity X 1 with success to NSR. Patient initially went into what appears to be atrial tachycardia @ 110/min, hence second shock administered at 50 J. Patient tolerated the procedure well. No immediate complication noted.

## 2015-10-29 NOTE — Interval H&P Note (Signed)
History and Physical Interval Note:  10/29/2015 9:17 AM  [name]  has presented today for surgery, with the diagnosis of A fib  The various methods of treatment have been discussed with the patient and family. After consideration of risks, benefits and other options for treatment, the patient has consented to  Procedure(s): CARDIOVERSION (N/A) as a surgical intervention .  The patient's history has been reviewed, patient examined, no change in status, stable for surgery.  I have reviewed the patient's chart and labs.  Questions were answered to the patient's satisfaction.     [name]

## 2015-10-30 ENCOUNTER — Encounter (HOSPITAL_COMMUNITY): Payer: Self-pay

## 2015-10-30 ENCOUNTER — Emergency Department (HOSPITAL_COMMUNITY)
Admission: EM | Admit: 2015-10-30 | Discharge: 2015-10-31 | Disposition: A | Discharge status: Home / Self Care | Payer: Medicare Other | Attending: Emergency Medicine | Admitting: Emergency Medicine

## 2015-10-30 DIAGNOSIS — I471 Supraventricular tachycardia: Secondary | ICD-10-CM

## 2015-10-30 DIAGNOSIS — Z7901 Long term (current) use of anticoagulants: Secondary | ICD-10-CM | POA: Insufficient documentation

## 2015-10-30 DIAGNOSIS — I4892 Unspecified atrial flutter: Secondary | ICD-10-CM | POA: Diagnosis not present

## 2015-10-30 DIAGNOSIS — I2510 Atherosclerotic heart disease of native coronary artery without angina pectoris: Secondary | ICD-10-CM | POA: Diagnosis not present

## 2015-10-30 DIAGNOSIS — Z79899 Other long term (current) drug therapy: Secondary | ICD-10-CM | POA: Insufficient documentation

## 2015-10-30 DIAGNOSIS — I10 Essential (primary) hypertension: Secondary | ICD-10-CM | POA: Diagnosis not present

## 2015-10-30 DIAGNOSIS — I252 Old myocardial infarction: Secondary | ICD-10-CM | POA: Diagnosis not present

## 2015-10-30 DIAGNOSIS — R000 Tachycardia, unspecified: Secondary | ICD-10-CM | POA: Diagnosis present

## 2015-10-30 DIAGNOSIS — Z7982 Long term (current) use of aspirin: Secondary | ICD-10-CM | POA: Insufficient documentation

## 2015-10-30 DIAGNOSIS — I499 Cardiac arrhythmia, unspecified: Secondary | ICD-10-CM | POA: Diagnosis not present

## 2015-10-30 NOTE — ED Provider Notes (Signed)
[facility] DEPT Provider Note   CSN: [id number] Arrival date & time: 10/30/15  2330  First Provider Contact:  11:57 PM   By signing my name below, I, [name], attest that this documentation has been prepared under the direction and in the presence of Merryl Hacker, MD. Electronically Signed: Reola [name], ED Scribe. 10/31/15. 12:25 AM.  History   Chief Complaint Chief Complaint  Patient presents with  . Tachycardia   The history is provided by the patient and the EMS personnel. No language interpreter was used.    HPI Comments: [name] is a 75 y.o. male BIB EMS with a PMHx significant of  HTN, HLD Afib, heart murmur, MI, and GERD, who presents to the Emergency Department complaining of gradual onset, resolved, burning left sided chest pain onset PTA. His wife notes that he also was complaining of chest tightness at the onset of his symptoms. He denies any pain/tightness in the ED. He notes that he was eating dinner when he felt like he was having heart burn, and used his pulse oximeter that showed his heart rate was 116. Additionally, he was experiencing mild SOB, which has now resolved. EMS notes that he took ASA and his daily anticoagulant prior to pick-up, with minimal relief of his symptoms. He additionally reports taking his daily doses of Eliquis, Cardizem, and Metoprolol ~3 hours prior to coming into the ED. Pt reports that he had a cardioversion for his hx of Afib 4 days PTA, and it was his first. Pt has had a heart stent placement in the past. He is currently followed by a Cardiologist. No recent illness. Denies abdominal pain, or any other symptoms.   Past Medical History:  Diagnosis Date  . Arthritis   . Chronic lower back pain    "last 3 months; usually when I bend" (12/24/2011)  . Coronary artery disease   . Endocarditis   . GERD (gastroesophageal reflux disease)   . Heart murmur   . High cholesterol   . Hypertension   . Migraines      "none in the last couple years" (12/24/2011)  . Myocardial infarction [facility])    POSSIBLE 2012 ELEVATED ENZYMES  . Pneumonia ~ 2011    Patient Active Problem List   Diagnosis Date Noted  . SVT (supraventricular tachycardia) ([place]) 01/31/2015  . Colonic mass, right colon. 11/10/2011  . Acute bacterial endocarditis 11/06/2011  . HTN (hypertension) 11/06/2011  . BPH (benign prostatic hyperplasia) 11/06/2011  . 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 Back pain 11/06/2011  . Hyperlipidemia 11/06/2011  . Acute renal insufficiency 08/25/2010  . Viridans streptococci infection 08/25/2010  . Streptococcus infection 08/25/2010  . Tooth infection 08/25/2010  . Status post PICC central line placement 08/25/2010  . Arm pain, right 08/25/2010  . ENDOCARDITIS, BACTERIAL, SUBACUTE 05/07/2010  . ANEMIA, IRON DEFICIENCY 05/06/2010  . Hypertrophic obstructive cardiomyopathy ([place]) 05/06/2010  . SYNCOPE 05/06/2010  . DYSPNEA ON EXERTION 05/06/2010  . ECHOCARDIOGRAM, ABNORMAL 05/06/2010  . HEART MURMUR, HX OF 05/06/2010    Past Surgical History:  Procedure Laterality Date  . CARDIOVERSION N/A 10/29/2015   Procedure: CARDIOVERSION;  Surgeon: Adrian Prows, MD;  Location: [facility];  Service: Cardiovascular;  Laterality: N/A;  . COLON RESECTION  12/25/2011   Procedure: COLON RESECTION LAPAROSCOPIC;  Surgeon: Shann Medal, MD;  Location: [facility];  Service: General;  Laterality: Right;  laparoscopic assisted right colon resection  . COLONOSCOPY  11/11/2011   Procedure: COLONOSCOPY;  Surgeon: Missy Sabins,  MD;  Location: [facility];  Service: Endoscopy;  Laterality: N/A;  . CORONARY ANGIOPLASTY WITH STENT PLACEMENT  11/11/2010   "1"  . CYSTECTOMY  ~ 1980   right eyelid  . TEE WITHOUT CARDIOVERSION  11/06/2011   Procedure: TRANSESOPHAGEAL ECHOCARDIOGRAM (TEE);  Surgeon: Laverda Page, MD;  Location: [facility];  Service: Cardiovascular;  Laterality: N/A;     Home Medications    Prior to Admission  medications   Medication Sig Start Date End Date Taking? Authorizing Provider  apixaban (ELIQUIS) 5 MG TABS tablet Take 5 mg by mouth 2 (two) times daily.   Yes Historical Provider, MD  Ascorbic Acid (VITAMIN C) 500 MG tablet Take 500 mg by mouth 2 (two) times daily.     Yes Historical Provider, MD  aspirin 81 MG tablet Take 81 mg by mouth daily.   Yes Historical Provider, MD  b complex vitamins tablet Take 1 tablet by mouth daily.   Yes Historical Provider, MD  Cholecalciferol (VITAMIN D3 PO) Take 1 capsule by mouth daily.   Yes Historical Provider, MD  Coenzyme Q10 (EQL COQ10) 300 MG CAPS Take 1 capsule by mouth every morning.    Yes Historical Provider, MD  diltiazem (CARDIZEM CD) 180 MG 24 hr capsule Take 180 mg by mouth 2 (two) times daily.   Yes Historical Provider, MD  glucosamine-chondroitin 500-400 MG tablet Take 1 tablet by mouth 2 (two) times daily.    Yes Historical Provider, MD  HAWTHORN BERRY PO Take 1 capsule by mouth every morning.    Yes Historical Provider, MD  Magnesium 250 MG TABS Take 250 mg by mouth daily.    Yes Historical Provider, MD  Multiple Vitamin (MULTIVITAMIN WITH MINERALS) TABS Take 1 tablet by mouth every morning.    Yes Historical Provider, MD  OVER THE COUNTER MEDICATION Take 1,000 mg by mouth every morning. BILLBERRY CAPSULE 1000MG    Yes Historical Provider, MD  Potassium (POTASSIMIN PO) Take 1 tablet by mouth daily.   Yes Historical Provider, MD  pravastatin (PRAVACHOL) 40 MG tablet Take 40 mg by mouth at bedtime.    Yes Historical Provider, MD  ranitidine (ZANTAC) 75 MG tablet Take 75 mg by mouth 2 (two) times daily.   Yes Historical Provider, MD  saw palmetto 160 MG capsule Take 160 mg by mouth 2 (two) times daily.    Yes Historical Provider, MD  vitamin E 400 UNIT capsule Take 400 Units by mouth daily.    Yes Historical Provider, MD  VITAMIN K PO Take 1 capsule by mouth daily.   Yes Historical Provider, MD  metoprolol succinate (TOPROL-XL) 50 MG 24 hr  tablet Take 1 tablet (50 mg total) by mouth daily. 10/31/15   Merryl Hacker, MD    Family History Family History  Problem Relation Age of Onset  . Hypertension Mother   . Cancer Neg Hx   . Diabetes Neg Hx   . Heart failure Neg Hx   . Hyperlipidemia Neg Hx   . Sudden death Neg Hx   . Stroke Neg Hx   . Heart attack Neg Hx     Social History Social History  Substance Use Topics  . Smoking status: Never Smoker  . Smokeless tobacco: Never Used     Comment: 12/24/2011 "used to puff cigarettes; never inhaled"  . Alcohol use 0.6 oz/week    1 Cans of beer per week    Allergies   Brilinta [ticagrelor] and Penicillins   Review of Systems Review of  Systems  Constitutional: Negative for fever.  Respiratory: Positive for chest tightness and shortness of breath.   Cardiovascular: Positive for chest pain (burning, left sided) and palpitations. Negative for leg swelling.  Gastrointestinal: Negative for abdominal pain.  All other systems reviewed and are negative.  Physical Exam Updated Vital Signs BP 98/63   Pulse 62   Temp 98.1 F (36.7 C) (Oral)   Resp 13   Wt 200 lb (90.7 kg)   SpO2 100%   BMI 27.12 kg/m   Physical Exam  Constitutional: He is oriented to person, place, and time. He appears well-developed and well-nourished. No distress.  elderly  HENT:  Head: Normocephalic and atraumatic.  Eyes: Pupils are equal, round, and reactive to light.  Neck: Neck supple.  Cardiovascular: Regular rhythm and normal heart sounds.   No murmur heard. Tachycardia  Pulmonary/Chest: Effort normal and breath sounds normal. No respiratory distress. He has no wheezes.  Abdominal: Soft. Bowel sounds are normal. There is no tenderness. There is no rebound.  Musculoskeletal: He exhibits no edema.  Lymphadenopathy:    He has no cervical adenopathy.  Neurological: He is alert and oriented to person, place, and time.  Skin: Skin is warm and dry.  Psychiatric: He has a normal mood and  affect.  Nursing note and vitals reviewed.    ED Treatments / Results  Labs (all labs ordered are listed, but only abnormal results are displayed) Labs Reviewed  CBC WITH DIFFERENTIAL/PLATELET - Abnormal; Notable for the following:       Result Value   RBC 3.58 (*)    Hemoglobin 10.6 (*)    HCT 32.8 (*)    RDW 15.6 (*)    Platelets 145 (*)    All other components within normal limits  BASIC METABOLIC PANEL - Abnormal; Notable for the following:    Glucose, Bld 101 (*)    BUN 24 (*)    GFR calc non Af Amer 59 (*)    All other components within normal limits  TROPONIN I - Abnormal; Notable for the following:    Troponin I 0.03 (*)    All other components within normal limits    EKG  EKG Interpretation  Date/Time:  Wednesday October 30 2015 23:35:56 EDT Ventricular Rate:  114 PR Interval:    QRS Duration: 144 QT Interval:  369 QTC Calculation: 509 R Axis:   -33 Text Interpretation:  Sinus tachycardia Left bundle branch block Since last tracing rate faster similar morphology to prior Confirmed by FLOYD MD, DANIEL [phone]) on 10/30/2015 11:46:30 PM       EKG Interpretation  Date/Time:  Thursday October 31 2015 03:17:43 EDT Ventricular Rate:  69 PR Interval:    QRS Duration: 133 QT Interval:  410 QTC Calculation: 440 R Axis:   -28 Text Interpretation:  Sinus rhythm Prolonged PR interval Confirmed by Dina Rich  MD, COURTNEY ([id number]) on 10/31/2015 6:55:14 AM        Radiology Dg Chest 2 View  Result Date: 10/31/2015 CLINICAL DATA:  Heart arrhythmia EXAM: CHEST  2 VIEW COMPARISON:  10/26/2015; 01/13/2015; 12/17/2011 FINDINGS: Grossly unchanged enlarged cardiac silhouette. Unchanged mediastinal contours with mild tortuosity of the thoracic aorta. Linear heterogeneous opacities within the left lower lung are unchanged and favored to represent atelectasis or scar. No focal airspace opacities. Punctate (approximately 4 mm) granuloma within the right costophrenic angle is unchanged  since the 12/2011 examination and thus of benign etiology. No pleural effusion or pneumothorax. No evidence of edema. No acute osseus  abnormalities. IMPRESSION: Cardiomegaly without acute cardiopulmonary disease. Specifically, no evidence of edema. Electronically Signed   By: Sandi Mariscal M.D.   On: 10/31/2015 00:27   Procedures .Cardioversion Date/Time: 10/31/2015 3:20 AM Performed by: Merryl Hacker Authorized by: Merryl Hacker   Consent:    Consent obtained:  Written   Consent given by:  Patient   Risks discussed:  Pain, induced arrhythmia and death Pre-procedure details:    Cardioversion basis:  Emergent   Rhythm:  Atrial flutter   Electrode placement:  Anterior-posterior Attempt one:    Cardioversion mode:  Synchronous   Waveform:  Biphasic   Shock (Joules):  100   Shock outcome:  Conversion to normal sinus rhythm Post-procedure details:    Patient status:  Awake   Patient tolerance of procedure:  Tolerated well, no immediate complications   (including critical care time)  Procedural sedation Performed by: Merryl Hacker Consent: Verbal consent obtained. Risks and benefits: risks, benefits and alternatives were discussed Required items: required blood products, implants, devices, and special equipment available Patient identity confirmed: arm band and provided demographic data Time out: Immediately prior to procedure a "time out" was called to verify the correct patient, procedure, equipment, support staff and site/side marked as required.  Sedation type: moderate (conscious) sedation NPO time confirmed and considedered  Sedatives: ETOMIDATE  Physician Time at Bedside: 15 min  Vitals: Vital signs were monitored during sedation. Cardiac Monitor, pulse oximeter Patient tolerance: Patient tolerated the procedure well with no immediate complications. Comments: Pt with uneventful recovered. Returned to pre-procedural sedation baseline   Medications Ordered  in ED Medications  sodium chloride 0.9 % bolus 1,000 mL (0 mLs Intravenous Stopped 10/31/15 0412)  metoprolol (LOPRESSOR) injection 5 mg (5 mg Intravenous Given 10/31/15 0255)  etomidate (AMIDATE) injection (12 mg Intravenous Given 10/31/15 0314)  sodium chloride 0.9 % bolus 500 mL (0 mLs Intravenous Stopped 10/31/15 0637)     Initial Impression / Assessment and Plan / ED Course  I have reviewed the triage vital signs and the nursing notes.  Pertinent labs & imaging results that were available during my care of the patient were reviewed by me and considered in my medical decision making (see chart for details).  Clinical Course   Patient presents with chest burning and palpitations prior to arrival. Recent cardioversion.  Currently asymptomatic. Patient was given a full dose aspirin. No evidence of ischemia on EKG. EKG is either sinus tachycardia or atrial flutter. Difficult to ascertain. His rate has stayed at 115. Troponin 0.03 which is just slightly abnormal. Patient has been pain-free while in the ER. Discussed the patient with Dr. Einar Gip. Dr. Einar Gip believes that this is likely flutter. He requests cardioversion. Patient reports compliance with Elliquis.  He was consented and cardioverted successfully.   Troponin leak likely rate related and no repeat troponin obtained. He is remained asymptomatic. Blood pressure was slightly low. Patient reports that he has "low blood pressure at times." He did take all of his medication prior to arrival and received 5 mg of metoprolol in the ER. He remained asymptomatic following cardioversion refill he is safe for discharge home. Increase metoprolol to 50mg  daily at bedtime. Follow-up with Dr. Einar Gip.  Final Clinical Impressions(s) / ED Diagnoses   Final diagnoses:  Atrial flutter, unspecified type [facility])    New Prescriptions Current Discharge Medication List     I personally performed the services described in this documentation, which was scribed in my  presence. The recorded information has been  reviewed and is accurate.     Merryl Hacker, MD 10/31/15 0700

## 2015-10-30 NOTE — ED Triage Notes (Signed)
Patient was seen here yesterday and cardioverted. Advised to stay for ablation but ended up going home.  Patient has no complaints of pain.  Took ASA and blood thinner before calling EMS.

## 2015-10-31 ENCOUNTER — Emergency Department (HOSPITAL_COMMUNITY): Payer: Medicare Other

## 2015-10-31 DIAGNOSIS — I4892 Unspecified atrial flutter: Secondary | ICD-10-CM | POA: Diagnosis not present

## 2015-10-31 DIAGNOSIS — I499 Cardiac arrhythmia, unspecified: Secondary | ICD-10-CM | POA: Diagnosis not present

## 2015-10-31 LAB — BASIC METABOLIC PANEL
ANION GAP: 7 (ref 5–15)
BUN: 24 mg/dL — AB (ref 6–20)
CHLORIDE: 107 mmol/L (ref 101–111)
CO2: 25 mmol/L (ref 22–32)
Calcium: 9.5 mg/dL (ref 8.9–10.3)
Creatinine, Ser: 1.19 mg/dL (ref 0.61–1.24)
GFR calc Af Amer: >60 mL/min (ref >60)
GFR calc non Af Amer: 59 mL/min — ABNORMAL LOW (ref >60)
GLUCOSE: 101 mg/dL — AB (ref 65–99)
POTASSIUM: 4.2 mmol/L (ref 3.5–5.1)
Sodium: 139 mmol/L (ref 135–145)

## 2015-10-31 LAB — CBC WITH DIFFERENTIAL/PLATELET
Basophils Absolute: 0 10*3/uL (ref 0.0–0.1)
Basophils Relative: 0 %
EOS PCT: 1 %
Eosinophils Absolute: 0.1 10*3/uL (ref 0.0–0.7)
HEMATOCRIT: 32.8 % — AB (ref 39.0–52.0)
HEMOGLOBIN: 10.6 g/dL — AB (ref 13.0–17.0)
LYMPHS ABS: 1.4 10*3/uL (ref 0.7–4.0)
LYMPHS PCT: 27 %
MCH: 29.6 pg (ref 26.0–34.0)
MCHC: 32.3 g/dL (ref 30.0–36.0)
MCV: 91.6 fL (ref 78.0–100.0)
Monocytes Absolute: 0.9 10*3/uL (ref 0.1–1.0)
Monocytes Relative: 18 %
NEUTROS PCT: 54 %
Neutro Abs: 2.9 10*3/uL (ref 1.7–7.7)
PLATELETS: 145 10*3/uL — AB (ref 150–400)
RBC: 3.58 MIL/uL — AB (ref 4.22–5.81)
RDW: 15.6 % — ABNORMAL HIGH (ref 11.5–15.5)
WBC: 5.3 10*3/uL (ref 4.0–10.5)

## 2015-10-31 LAB — TROPONIN I: Troponin I: 0.03 ng/mL (ref <0.03)

## 2015-10-31 MED ORDER — METOPROLOL SUCCINATE ER 50 MG PO TB24: 50.0000 mg | ORAL_TABLET | Freq: Every day | ORAL | 0 refills | Status: DC

## 2015-10-31 MED ORDER — ETOMIDATE 2 MG/ML IV SOLN
INTRAVENOUS | Status: AC
  Filled 2015-10-31: qty 10

## 2015-10-31 MED ORDER — SODIUM CHLORIDE 0.9 % IV BOLUS (SEPSIS)
1000.0000 mL | Freq: Once | INTRAVENOUS | Status: AC
  Administered 2015-10-31: 1000 mL via INTRAVENOUS

## 2015-10-31 MED ORDER — METOPROLOL SUCCINATE ER 100 MG PO TB24: 100.0000 mg | ORAL_TABLET | Freq: Every day | ORAL | 0 refills | Status: DC

## 2015-10-31 MED ORDER — SODIUM CHLORIDE 0.9 % IV BOLUS (SEPSIS)
500.0000 mL | Freq: Once | INTRAVENOUS | Status: AC
  Administered 2015-10-31: 500 mL via INTRAVENOUS

## 2015-10-31 MED ORDER — METOPROLOL TARTRATE 5 MG/5ML IV SOLN
5.0000 mg | Freq: Once | INTRAVENOUS | Status: AC
  Administered 2015-10-31: 5 mg via INTRAVENOUS
  Filled 2015-10-31: qty 5

## 2015-10-31 MED ORDER — ETOMIDATE 2 MG/ML IV SOLN
INTRAVENOUS | Status: AC | PRN
  Administered 2015-10-31: 12 mg via INTRAVENOUS

## 2015-10-31 MED ORDER — ASPIRIN 81 MG PO CHEW
324.0000 mg | CHEWABLE_TABLET | Freq: Once | ORAL | Status: DC
  Filled 2015-10-31: qty 4

## 2015-10-31 NOTE — ED Notes (Signed)
Patient endorses he has had fluttering in his chest.  Not feeling lightheaded or having chest pain

## 2015-10-31 NOTE — ED Notes (Signed)
Patient walked independently to the restroom

## 2015-10-31 NOTE — Discharge Instructions (Signed)
You were seen today and found to be in atrial flutter. You were cardioverted again. Increase her metoprolol to 50 mg daily at night. Follow-up with Dr. Einar Gip closely. If you have recurrent symptoms she should be reevaluated immediately.

## 2015-10-31 NOTE — ED Notes (Signed)
Patient alert and breathing at a regular rate.  NSR on the ekg.  Patient denies any pain at this point.  Will continue to monitor

## 2015-10-31 NOTE — Progress Notes (Signed)
RT NOTE:  RT @ bedside for Cardioversion. AMBU bag @ bedside. Pt wearing 2L [place]. Pt remained stable throughout procedure. No respiratory intervention needed.

## 2015-10-31 NOTE — Anesthesia Postprocedure Evaluation (Signed)
Anesthesia Post Note  Patient: [name]  Procedure(s) Performed: Procedure(s) (LRB): CARDIOVERSION (N/A)  Patient location during evaluation: Endoscopy Anesthesia Type: General Level of consciousness: awake Pain management: pain level controlled Vital Signs Assessment: post-procedure vital signs reviewed and stable Respiratory status: spontaneous breathing Cardiovascular status: stable Postop Assessment: no signs of nausea or vomiting Anesthetic complications: no    Last Vitals:  Vitals:   10/29/15 0940 10/29/15 0950  BP: (!) 128/55 (!) 129/57  Pulse: 65 64  Resp: 13 11  Temp:      Last Pain:  Vitals:   10/29/15 0937  TempSrc: Oral                 Eugine Bubb

## 2015-10-31 NOTE — Anesthesia Preprocedure Evaluation (Signed)
Anesthesia Evaluation  Patient identified by MRN, date of birth, ID band Patient awake    Reviewed: Allergy & Precautions, NPO status , Patient's Chart, lab work & pertinent test results  History of Anesthesia Complications Negative for: history of anesthetic complications  Airway Mallampati: II  TM Distance: >3 FB Neck ROM: Full    Dental  (+) Dental Advisory Given   Pulmonary shortness of breath,    breath sounds clear to auscultation       Cardiovascular hypertension, + CAD and + Past MI   Rhythm:Irregular     Neuro/Psych  Headaches, negative psych ROS   GI/Hepatic GERD  ,  Endo/Other    Renal/GU Renal InsufficiencyRenal disease     Musculoskeletal  (+) Arthritis ,   Abdominal   Peds  Hematology  (+) anemia ,   Anesthesia Other Findings   Reproductive/Obstetrics                             Anesthesia Physical Anesthesia Plan  ASA: III  Anesthesia Plan: General   Post-op Pain Management:    Induction: Intravenous  Airway Management Planned: Mask  Additional Equipment: None  Intra-op Plan:   Post-operative Plan:   Informed Consent: I have reviewed the patients History and Physical, chart, labs and discussed the procedure including the risks, benefits and alternatives for the proposed anesthesia with the patient or authorized representative who has indicated his/her understanding and acceptance.   Dental advisory given  Plan Discussed with: CRNA and Surgeon  Anesthesia Plan Comments:         Anesthesia Quick Evaluation

## 2015-10-31 NOTE — ED Notes (Signed)
Dr. Horton at bedside at this time.  

## 2015-11-06 DIAGNOSIS — I2510 Atherosclerotic heart disease of native coronary artery without angina pectoris: Secondary | ICD-10-CM | POA: Diagnosis not present

## 2015-11-06 DIAGNOSIS — Q244 Congenital subaortic stenosis: Secondary | ICD-10-CM | POA: Diagnosis not present

## 2015-11-06 DIAGNOSIS — I422 Other hypertrophic cardiomyopathy: Secondary | ICD-10-CM | POA: Diagnosis not present

## 2015-11-06 DIAGNOSIS — I480 Paroxysmal atrial fibrillation: Secondary | ICD-10-CM | POA: Diagnosis not present

## 2015-11-12 DIAGNOSIS — I340 Nonrheumatic mitral (valve) insufficiency: Secondary | ICD-10-CM | POA: Diagnosis not present

## 2015-11-12 DIAGNOSIS — I422 Other hypertrophic cardiomyopathy: Secondary | ICD-10-CM | POA: Diagnosis not present

## 2015-11-15 ENCOUNTER — Encounter (HOSPITAL_COMMUNITY): Payer: Self-pay | Admitting: Emergency Medicine

## 2015-11-15 ENCOUNTER — Emergency Department (HOSPITAL_COMMUNITY): Payer: Medicare Other

## 2015-11-15 ENCOUNTER — Telehealth: Payer: Self-pay | Admitting: Internal Medicine

## 2015-11-15 ENCOUNTER — Emergency Department (HOSPITAL_COMMUNITY)
Admission: EM | Admit: 2015-11-15 | Discharge: 2015-11-15 | Disposition: A | Discharge status: Home / Self Care | Payer: Medicare Other | Attending: Emergency Medicine | Admitting: Emergency Medicine

## 2015-11-15 DIAGNOSIS — I10 Essential (primary) hypertension: Secondary | ICD-10-CM | POA: Diagnosis not present

## 2015-11-15 DIAGNOSIS — Z7982 Long term (current) use of aspirin: Secondary | ICD-10-CM | POA: Insufficient documentation

## 2015-11-15 DIAGNOSIS — I2510 Atherosclerotic heart disease of native coronary artery without angina pectoris: Secondary | ICD-10-CM | POA: Diagnosis not present

## 2015-11-15 DIAGNOSIS — R0789 Other chest pain: Secondary | ICD-10-CM | POA: Diagnosis not present

## 2015-11-15 DIAGNOSIS — D649 Anemia, unspecified: Secondary | ICD-10-CM | POA: Diagnosis not present

## 2015-11-15 DIAGNOSIS — R739 Hyperglycemia, unspecified: Secondary | ICD-10-CM | POA: Diagnosis not present

## 2015-11-15 DIAGNOSIS — Z955 Presence of coronary angioplasty implant and graft: Secondary | ICD-10-CM | POA: Diagnosis not present

## 2015-11-15 DIAGNOSIS — I252 Old myocardial infarction: Secondary | ICD-10-CM | POA: Diagnosis not present

## 2015-11-15 DIAGNOSIS — R079 Chest pain, unspecified: Secondary | ICD-10-CM

## 2015-11-15 DIAGNOSIS — Z7901 Long term (current) use of anticoagulants: Secondary | ICD-10-CM | POA: Diagnosis not present

## 2015-11-15 DIAGNOSIS — R0602 Shortness of breath: Secondary | ICD-10-CM | POA: Diagnosis not present

## 2015-11-15 LAB — BASIC METABOLIC PANEL
Anion gap: 9 (ref 5–15)
BUN: 23 mg/dL — AB (ref 6–20)
CHLORIDE: 104 mmol/L (ref 101–111)
CO2: 26 mmol/L (ref 22–32)
CREATININE: 1.12 mg/dL (ref 0.61–1.24)
Calcium: 8.9 mg/dL (ref 8.9–10.3)
GFR calc Af Amer: >60 mL/min (ref >60)
GFR calc non Af Amer: >60 mL/min (ref >60)
GLUCOSE: 119 mg/dL — AB (ref 65–99)
POTASSIUM: 3.8 mmol/L (ref 3.5–5.1)
SODIUM: 139 mmol/L (ref 135–145)

## 2015-11-15 LAB — CBC WITH DIFFERENTIAL/PLATELET
Basophils Absolute: 0 10*3/uL (ref 0.0–0.1)
Basophils Relative: 0 %
EOS ABS: 0.1 10*3/uL (ref 0.0–0.7)
EOS PCT: 2 %
HCT: 32.8 % — ABNORMAL LOW (ref 39.0–52.0)
Hemoglobin: 10.3 g/dL — ABNORMAL LOW (ref 13.0–17.0)
LYMPHS ABS: 1.5 10*3/uL (ref 0.7–4.0)
LYMPHS PCT: 36 %
MCH: 28.6 pg (ref 26.0–34.0)
MCHC: 31.4 g/dL (ref 30.0–36.0)
MCV: 91.1 fL (ref 78.0–100.0)
MONO ABS: 0.6 10*3/uL (ref 0.1–1.0)
MONOS PCT: 16 %
Neutro Abs: 1.9 10*3/uL (ref 1.7–7.7)
Neutrophils Relative %: 46 %
PLATELETS: 133 10*3/uL — AB (ref 150–400)
RBC: 3.6 MIL/uL — AB (ref 4.22–5.81)
RDW: 15.8 % — ABNORMAL HIGH (ref 11.5–15.5)
WBC: 4.1 10*3/uL (ref 4.0–10.5)

## 2015-11-15 LAB — I-STAT TROPONIN, ED
TROPONIN I, POC: 0.01 ng/mL (ref 0.00–0.08)
Troponin i, poc: 0.01 ng/mL (ref 0.00–0.08)

## 2015-11-15 NOTE — ED Notes (Signed)
Pt ambulatory w/ steady gait in hallway.

## 2015-11-15 NOTE — Telephone Encounter (Signed)
Patient was in ED early this am because he was out of rhythm and symptomatic.  He was dizzy and SOB, believed he was in atrial fibrillation.  He was in NSR.   Calling now because he is out of rhythm again.  Taking Eliquis.  Called Dr. Einar Gip to ask about starting Tikosyn and was instructed to call Dr. Tanna Furry office.    He wants to start taking Tikosyn and would like to know how to proceed.  He is aware that I am forwarding to Dr. [name] and his nurse and also to Pharmacy for further review/recommendations.

## 2015-11-15 NOTE — ED Notes (Signed)
Pt ambulatory w/ steady gait to restroom. 

## 2015-11-15 NOTE — ED Provider Notes (Signed)
[facility] DEPT Provider Note   CSN: [id number] Arrival date & time: 11/15/15  [id number]  First Provider Contact:  First MD Initiated Contact with Patient 11/15/15 0346        History   Chief Complaint Chief Complaint  Patient presents with  . Shortness of Breath    HPI [name] is a 75 y.o. male.  [name] is a 75 y.o. male with history of HLD, anemia, HOCM, HTN, and recent dx of atrial fibrillation presents to ED with complaint of palpitations, chest pain, and shortness of breath. Patient states last night he was experiencing "fluttering" of his heart. He checked his pulse with his pulse oximeter and reports HR was jumping between 60-80bpm. He had associated shortness of breath and left sided, non-radiating chest pressure. He states symptoms lasted for a few minutes. He denies diaphoresis, nausea, dizziness, lightheadedness, LOC, fever, abdominal complaints, or urinary complaints. He states he took his medications at midnight. Review of records show patient has had two cardioversions for atrial fibrillation approximately two weeks ago. He is followed by Dr. Einar Gip of cardiology. He has been on eliquis since December 2016.      Past Medical History:  Diagnosis Date  . Arthritis   . Chronic lower back pain    "last 3 months; usually when I bend" (12/24/2011)  . Coronary artery disease   . Endocarditis   . GERD (gastroesophageal reflux disease)   . Heart murmur   . High cholesterol   . Hypertension   . Migraines    "none in the last couple years" (12/24/2011)  . Myocardial infarction [facility])    POSSIBLE 2012 ELEVATED ENZYMES  . Pneumonia ~ 2011    Patient Active Problem List   Diagnosis Date Noted  . SVT (supraventricular tachycardia) ([place]) 01/31/2015  . Colonic mass, right colon. 11/10/2011  . Acute bacterial endocarditis 11/06/2011  . HTN (hypertension) 11/06/2011  . BPH (benign prostatic hyperplasia) 11/06/2011  . 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  Back pain 11/06/2011  . Hyperlipidemia 11/06/2011  . Acute renal insufficiency 08/25/2010  . Viridans streptococci infection 08/25/2010  . Streptococcus infection 08/25/2010  . Tooth infection 08/25/2010  . Status post PICC central line placement 08/25/2010  . Arm pain, right 08/25/2010  . ENDOCARDITIS, BACTERIAL, SUBACUTE 05/07/2010  . ANEMIA, IRON DEFICIENCY 05/06/2010  . Hypertrophic obstructive cardiomyopathy ([place]) 05/06/2010  . SYNCOPE 05/06/2010  . DYSPNEA ON EXERTION 05/06/2010  . ECHOCARDIOGRAM, ABNORMAL 05/06/2010  . HEART MURMUR, HX OF 05/06/2010    Past Surgical History:  Procedure Laterality Date  . CARDIOVERSION N/A 10/29/2015   Procedure: CARDIOVERSION;  Surgeon: Adrian Prows, MD;  Location: [place];  Service: Cardiovascular;  Laterality: N/A;  . COLON RESECTION  12/25/2011   Procedure: COLON RESECTION LAPAROSCOPIC;  Surgeon: Shann Medal, MD;  Location: [place];  Service: General;  Laterality: Right;  laparoscopic assisted right colon resection  . COLONOSCOPY  11/11/2011   Procedure: COLONOSCOPY;  Surgeon: Missy Sabins, MD;  Location: [place];  Service: Endoscopy;  Laterality: N/A;  . CORONARY ANGIOPLASTY WITH STENT PLACEMENT  11/11/2010   "1"  . CYSTECTOMY  ~ 1980   right eyelid  . TEE WITHOUT CARDIOVERSION  11/06/2011   Procedure: TRANSESOPHAGEAL ECHOCARDIOGRAM (TEE);  Surgeon: Laverda Page, MD;  Location: [place];  Service: Cardiovascular;  Laterality: N/A;       Home Medications    Prior to Admission medications   Medication Sig Start Date End Date Taking? Authorizing Provider  apixaban Arne [place])  5 MG TABS tablet Take 5 mg by mouth 2 (two) times daily.   Yes Historical Provider, MD  Ascorbic Acid (VITAMIN C) 500 MG tablet Take 500 mg by mouth 2 (two) times daily.     Yes Historical Provider, MD  aspirin 81 MG tablet Take 81 mg by mouth daily.   Yes Historical Provider, MD  b complex vitamins tablet Take 1 tablet by mouth daily.   Yes Historical  Provider, MD  Cholecalciferol (VITAMIN D3 PO) Take 1 capsule by mouth daily.   Yes Historical Provider, MD  Coenzyme Q10 (EQL COQ10) 300 MG CAPS Take 1 capsule by mouth every morning.    Yes Historical Provider, MD  diltiazem (CARDIZEM CD) 180 MG 24 hr capsule Take 180 mg by mouth 2 (two) times daily.   Yes Historical Provider, MD  glucosamine-chondroitin 500-400 MG tablet Take 1 tablet by mouth 2 (two) times daily.    Yes Historical Provider, MD  HAWTHORN BERRY PO Take 1 capsule by mouth every morning.    Yes Historical Provider, MD  Magnesium 250 MG TABS Take 250 mg by mouth daily.    Yes Historical Provider, MD  metoprolol succinate (TOPROL-XL) 50 MG 24 hr tablet Take 1 tablet (50 mg total) by mouth daily. 10/31/15  Yes Merryl Hacker, MD  Multiple Vitamin (MULTIVITAMIN WITH MINERALS) TABS Take 1 tablet by mouth every morning.    Yes Historical Provider, MD  nitroGLYCERIN (NITROSTAT) 0.4 MG SL tablet Place 0.4 mg under the tongue every 5 (five) minutes as needed for chest pain.  11/06/15  Yes Historical Provider, MD  OVER THE COUNTER MEDICATION Take 1,000 mg by mouth every morning. BILLBERRY CAPSULE 1000MG    Yes Historical Provider, MD  Potassium (POTASSIMIN PO) Take 1 tablet by mouth daily.   Yes Historical Provider, MD  pravastatin (PRAVACHOL) 40 MG tablet Take 40 mg by mouth at bedtime.    Yes Historical Provider, MD  ranitidine (ZANTAC) 75 MG tablet Take 75 mg by mouth 2 (two) times daily.   Yes Historical Provider, MD  saw palmetto 160 MG capsule Take 160 mg by mouth 2 (two) times daily.    Yes Historical Provider, MD  vitamin E 400 UNIT capsule Take 400 Units by mouth daily.    Yes Historical Provider, MD  VITAMIN K PO Take 1 capsule by mouth daily.   Yes Historical Provider, MD    Family History Family History  Problem Relation Age of Onset  . Hypertension Mother   . Cancer Neg Hx   . Diabetes Neg Hx   . Heart failure Neg Hx   . Hyperlipidemia Neg Hx   . Sudden death Neg Hx   .  Stroke Neg Hx   . Heart attack Neg Hx     Social History Social History  Substance Use Topics  . Smoking status: Never Smoker  . Smokeless tobacco: Never Used     Comment: 12/24/2011 "used to puff cigarettes; never inhaled"  . Alcohol use No     Allergies   Brilinta [ticagrelor] and Penicillins   Review of Systems Review of Systems  Respiratory: Positive for shortness of breath ( resolved).   Cardiovascular: Positive for chest pain ( resolved), palpitations ( resolved) and leg swelling ( b/l, since Dec 2016, wears compression stockings).  All other systems reviewed and are negative.    Physical Exam Updated Vital Signs BP 110/62   Pulse 61   Temp 98.5 F (36.9 C) (Oral)   Resp 12  Ht 6' (1.829 m)   Wt 90.7 kg   SpO2 97%   BMI 27.12 kg/m   Physical Exam  Constitutional: He appears well-developed and well-nourished. No distress.  HENT:  Head: Normocephalic and atraumatic.  Mouth/Throat: Oropharynx is clear and moist. No oropharyngeal exudate.  Eyes: Conjunctivae and EOM are normal. Pupils are equal, round, and reactive to light. Right eye exhibits no discharge. Left eye exhibits no discharge. No scleral icterus.  Neck: Normal range of motion. Neck supple.  Cardiovascular: Normal rate, regular rhythm and intact distal pulses.   Murmur heard.  Systolic murmur is present  Pulmonary/Chest: Effort normal and breath sounds normal. No respiratory distress.  Abdominal: Soft. Bowel sounds are normal. There is no tenderness. There is no rebound and no guarding.  Musculoskeletal: Normal range of motion. He exhibits edema ( 1+ b/l lower extremity). He exhibits no tenderness.  Lymphadenopathy:    He has no cervical adenopathy.  Neurological: He is alert. Coordination normal.  Skin: Skin is warm and dry. He is not diaphoretic.  Psychiatric: He has a normal mood and affect. His behavior is normal.     ED Treatments / Results  Labs (all labs ordered are listed, but only  abnormal results are displayed) Labs Reviewed  BASIC METABOLIC PANEL - Abnormal; Notable for the following:       Result Value   Glucose, Bld 119 (*)    BUN 23 (*)    All other components within normal limits  CBC WITH DIFFERENTIAL/PLATELET - Abnormal; Notable for the following:    RBC 3.60 (*)    Hemoglobin 10.3 (*)    HCT 32.8 (*)    RDW 15.8 (*)    Platelets 133 (*)    All other components within normal limits  I-STAT TROPOININ, ED  I-STAT TROPOININ, ED    EKG  EKG Interpretation None       Radiology Dg Chest 2 View  Result Date: 11/15/2015 CLINICAL DATA:  Acute onset of shortness of breath. Initial encounter. EXAM: CHEST  2 VIEW COMPARISON:  Chest radiograph from 10/31/2015 FINDINGS: The lungs are well-aerated. Mild vascular congestion is noted. There is no evidence of focal opacification, pleural effusion or pneumothorax. The heart is normal in size; the mediastinal contour is within normal limits. No acute osseous abnormalities are seen. IMPRESSION: Mild vascular congestion noted.  Lungs remain grossly clear. Electronically Signed   By: Garald Balding M.D.   On: 11/15/2015 05:57    Procedures Procedures (including critical care time)  Medications Ordered in ED Medications - No data to display   Initial Impression / Assessment and Plan / ED Course  I have reviewed the triage vital signs and the nursing notes.  Pertinent labs & imaging results that were available during my care of the patient were reviewed by me and considered in my medical decision making (see chart for details).  Clinical Course    Patient is afebrile and non-toxic appearing in NAD. He is reclining in bed comfortably. Patient is currently asx. Respirations are unlabored. O2sats >94%. No accessory muscle use. Lungs are clear. Loud systolic murmur  - h/o heart murmur. B/l 1+ lower extremity swelling, patient states unchanged since 03/2015. Distal pulses intact. Initial I-stat troponin negative. BMP  shows mild hyperglycemia without evidence of AG. CBC shows mild anemia; however, stable compared to previous. CXR suggestive of mild vascular congestion, no evidence of focal consolidation, effusion, or PTX, patient does not appear volume overloaded. EKG unchanged from previous. Patient discussed with Dr.  Wickline, who also evaluated patient. Dr. Christy Gentles spoke with Dr. Einar Gip, cardiology, pt has history of paroxysmal palpitations, recommend OP follow up.  Patient is able to ambulate with steady gait and without assistance. Will delta trop.  At shift change discussed patient with Margarita Mail, PA-C. Pending no change in troponin patient d/c with close cardiology follow up. Suspect sxs may be related to paroxsymal palpitations. Return precautions discussed with patient. Patient voiced understanding and is agreeable.    Final Clinical Impressions(s) / ED Diagnoses   Final diagnoses:  None    New Prescriptions New Prescriptions   No medications on file     Roxanna Mew, PA-C 11/15/15 [id number]    [name], MD 11/16/15 [phone]

## 2015-11-15 NOTE — ED Notes (Signed)
Patient transported to X-ray 

## 2015-11-15 NOTE — ED Triage Notes (Signed)
Per EMS, pt from home with c/o chest pain/pressure beginning last night about 2100. Pt recently diagnosed with afib, also c/o fluttering in the chest. 324 aspirin PTA. BP-130/80, HR-70s

## 2015-11-15 NOTE — Telephone Encounter (Signed)
New message    Pt wants to discuss getting tikosyn. Please call.

## 2015-11-15 NOTE — ED Provider Notes (Signed)
Pt with palpitations and sob last nigth. HR 60-80. Extensive cardiac hx.  ganji- says if delta trop negative may follow up with him this week.    A/p- second troponin negative- f/u with toxicology.  Patient 2nd troponin negative. Will be discharged with strong return precautions and cardiology f.u.   Margarita Mail, PA-C 11/15/15 [place], MD 11/16/15 [phone]

## 2015-11-15 NOTE — Discharge Instructions (Signed)
Read the information below.   Your EKG is unchanged. Your labs are re-assuring.  Continue to take your home medications as prescribed.  It is important that you call and schedule a follow up appointment ASAP with cardiology.  You may return to the Emergency Department at any time for worsening condition or any new symptoms that concern you. Return to ED/call EMS if your symptoms worsen or you develop new symptoms such as - fever, coughing up blood, persistent chest pain, shortness of breath, difficulty breathing on exertion, difficulty breathing if laying flat, lightheadedness, or loss of consciousness.

## 2015-11-15 NOTE — ED Provider Notes (Signed)
ED ECG REPORT   Date: 11/15/2015  Rate: 69  Rhythm: normal sinus rhythm  QRS Axis: normal  Intervals: normal  ST/T Wave abnormalities: nonspecific ST changes  Conduction Disutrbances:left bundle branch block  Narrative Interpretation:   Old EKG Reviewed: unchanged  I have personally reviewed the EKG tracing and agree with the computerized printout as noted.    [name], MD 11/15/15 [phone]

## 2015-11-18 NOTE — Telephone Encounter (Signed)
Dr. [name] will be the one to decide if he would like pt started on Tikosyn. If so, pt needs BMET and Mg as well as confirmation he has not missed any Eliquis doses in the past month. If Dr. [name] wants pt started on Tikosyn, please route a staff message to me to begin precert process and scheduling pt.

## 2015-11-19 ENCOUNTER — Encounter: Payer: Self-pay | Admitting: Internal Medicine

## 2015-11-19 ENCOUNTER — Ambulatory Visit (INDEPENDENT_AMBULATORY_CARE_PROVIDER_SITE_OTHER): Payer: Medicare Other | Admitting: Internal Medicine

## 2015-11-19 VITALS — BP 114/68 | HR 65 | Ht 72.0 in | Wt 192.0 lb

## 2015-11-19 DIAGNOSIS — I480 Paroxysmal atrial fibrillation: Secondary | ICD-10-CM

## 2015-11-19 MED ORDER — AMIODARONE HCL 200 MG PO TABS: 200.0000 mg | ORAL_TABLET | Freq: Two times a day (BID) | ORAL | 3 refills | Status: DC

## 2015-11-19 NOTE — Telephone Encounter (Signed)
Patient seen 11/19/15 by Dr [name] and started on Amiodarone

## 2015-11-19 NOTE — Patient Instructions (Addendum)
Medication Instructions:  Your physician has recommended you make the following change in your medication:  1) Stop Diltiazem 8/16 2) Stop Metoprolol 8/19 3) Start Amiodarone 200 mg twice daily    Labwork: None ordered   Testing/Procedures: None ordered   Follow-Up: Your physician recommends that you schedule a follow-up appointment in: 1 week for EKG and 5 weeks with Dr [name]   Any Other Special Instructions Will Be Listed Below (If Applicable).     If you need a refill on your cardiac medications before your next appointment, please call your pharmacy.

## 2015-11-19 NOTE — Progress Notes (Signed)
HPI Mr. Caperton returns today after an absence from our EP clinic. He is a pleasant 75 yo man with a h/o multiple arrhythmias including SVT, Atrial flutter and most recently atrial fib. He has undergone multiple cardioversions and has been on both calcium channel and beta blockers. 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 He continues to go in and out of rhythm. He feels very poorly when he is in atrial fib with sob and fatigue. He is limited by the need to care for his wife who is sick and he is her caregiver. No syncope. Allergies  Allergen Reactions  . Brilinta [Ticagrelor] Itching and Rash  . Penicillins Rash    Has patient had a PCN reaction causing immediate rash, facial/tongue/throat swelling, SOB or lightheadedness with hypotension: Yes Has patient had a PCN reaction causing severe rash involving mucus membranes or skin necrosis: Yes Has patient had a PCN reaction that required hospitalization No Has patient had a PCN reaction occurring within the last 10 years: No If all of the above answers are "NO", then may proceed with Cephalosporin use.      Current Outpatient Prescriptions  Medication Sig Dispense Refill  . apixaban (ELIQUIS) 5 MG TABS tablet Take 5 mg by mouth 2 (two) times daily.    . Ascorbic Acid (VITAMIN C) 500 MG tablet Take 500 mg by mouth 2 (two) times daily.      Marland Kitchen aspirin 81 MG tablet Take 81 mg by mouth daily.    Marland Kitchen b complex vitamins tablet Take 1 tablet by mouth daily.    . Cholecalciferol (VITAMIN D3 PO) Take 1 capsule by mouth daily.    . Coenzyme Q10 (EQL COQ10) 300 MG CAPS Take 1 capsule by mouth every morning.     . diltiazem (CARDIZEM CD) 180 MG 24 hr capsule Take 180 mg by mouth 2 (two) times daily.    Marland Kitchen glucosamine-chondroitin 500-400 MG tablet Take 1 tablet by mouth 2 (two) times daily.     Marland Kitchen HAWTHORN BERRY PO Take 1 capsule by mouth every morning.     . Magnesium 250 MG TABS Take 250 mg by mouth  daily.     . metoprolol succinate (TOPROL-XL) 50 MG 24 hr tablet Take 1 tablet (50 mg total) by mouth daily. 30 tablet 0  . Multiple Vitamin (MULTIVITAMIN WITH MINERALS) TABS Take 1 tablet by mouth every morning.     . nitroGLYCERIN (NITROSTAT) 0.4 MG SL tablet Place 0.4 mg under the tongue every 5 (five) minutes as needed for chest pain.     Marland Kitchen OVER THE COUNTER MEDICATION Take 1,000 mg by mouth every morning. BILLBERRY CAPSULE 1000MG     . Potassium (POTASSIMIN PO) Take 1 tablet by mouth daily.    . pravastatin (PRAVACHOL) 40 MG tablet Take 40 mg by mouth at bedtime.     . ranitidine (ZANTAC) 75 MG tablet Take 75 mg by mouth 2 (two) times daily.    . saw palmetto 160 MG capsule Take 160 mg by mouth 2 (two) times daily.     . vitamin E 400 UNIT capsule Take 400 Units by mouth daily.     Marland Kitchen VITAMIN K PO Take 1 capsule by mouth daily.     No current facility-administered medications for this visit.      Past Medical History:  Diagnosis Date  . Arthritis   . Chronic lower back pain    "last 3 months; usually  when I bend" (12/24/2011)  . Coronary artery disease   . Endocarditis   . GERD (gastroesophageal reflux disease)   . Heart murmur   . High cholesterol   . Hypertension   . Migraines    "none in the last couple years" (12/24/2011)  . Myocardial infarction [facility])    POSSIBLE 2012 ELEVATED ENZYMES  . Pneumonia ~ 2011    ROS:   All systems reviewed and negative except as noted in the HPI.   Past Surgical History:  Procedure Laterality Date  . CARDIOVERSION N/A 10/29/2015   Procedure: CARDIOVERSION;  Surgeon: Adrian Prows, MD;  Location: [place];  Service: Cardiovascular;  Laterality: N/A;  . COLON RESECTION  12/25/2011   Procedure: COLON RESECTION LAPAROSCOPIC;  Surgeon: Shann Medal, MD;  Location: [place];  Service: General;  Laterality: Right;  laparoscopic assisted right colon resection  . COLONOSCOPY  11/11/2011   Procedure: COLONOSCOPY;  Surgeon: Missy Sabins, MD;  Location:  [place];  Service: Endoscopy;  Laterality: N/A;  . CORONARY ANGIOPLASTY WITH STENT PLACEMENT  11/11/2010   "1"  . CYSTECTOMY  ~ 1980   right eyelid  . TEE WITHOUT CARDIOVERSION  11/06/2011   Procedure: TRANSESOPHAGEAL ECHOCARDIOGRAM (TEE);  Surgeon: Laverda Page, MD;  Location: [facility];  Service: Cardiovascular;  Laterality: N/A;     Family History  Problem Relation Age of Onset  . Hypertension Mother   . Cancer Neg Hx   . Diabetes Neg Hx   . Heart failure Neg Hx   . Hyperlipidemia Neg Hx   . Sudden death Neg Hx   . Stroke Neg Hx   . Heart attack Neg Hx      Social History   Social History  . Marital status: Married    Spouse name: N/A  . Number of children: N/A  . Years of education: N/A   Occupational History  . Not on file.   Social History Main Topics  . Smoking status: Never Smoker  . Smokeless tobacco: Never Used     Comment: 12/24/2011 "used to puff cigarettes; never inhaled"  . Alcohol use No  . Drug use: No  . Sexual activity: No   Other Topics Concern  . Not on file   Social History Narrative  . No narrative on file     BP 114/68   Pulse 65   Ht 6' (1.829 m)   Wt 192 lb (87.1 kg)   BMI 26.04 kg/m   Physical Exam:  Well appearing 75 yo man, NAD HEENT: Unremarkable Neck:  6 cm JVD, no thyromegally Lymphatics:  No adenopathy Back:  No CVA tenderness Lungs:  Clear with no wheezes HEART:  Regular rate rhythm, no murmurs, no rubs, no clicks Abd:  soft, positive bowel sounds, no organomegally, no rebound, no guarding Ext:  2 plus pulses, no edema, no cyanosis, no clubbing Skin:  No rashes no nodules Neuro:  CN II through XII intact, motor grossly intact  EKG - reviewed. Most recent on 8/11 reveals NSR   Assess/Plan: 1. PAF/SVT/Atrial flutter - he has diseased atria and the likelihood of a durable cure of all of the above with catheter ablation is low. In addition, his QT is long for Tikosyn and staying 3-4 days in the hospital an  issue with his social situation. Amiodarone could be started as an outpatient (we also considered multaq but expense likely prohibited). While there is the possibility of symptomatic bradycardia, my hope is that by stopping his calcium  channel blocker and beta blocker, we can avoid symptomatic bradycardia and the need for PPM. I have reviewed the possible side effects. We will ask him to return for an ecg in a week. I will see him back in 4 weeks.  2. CAD - he denies anginal symptoms. 3. HTN - he may require additional adjustments in his meds once the calcium channel and beta blocker are stopped 4. Coags - he will continue his Eliquis for now.  Mikle Bosworth.D.

## 2015-11-25 ENCOUNTER — Telehealth: Payer: Self-pay | Admitting: Internal Medicine

## 2015-11-25 NOTE — Telephone Encounter (Signed)
New Message  Patient c/o Palpitations:  High priority if patient c/o lightheadedness and shortness of breath.  1. How long have you been having palpitations? Yesterday and today  2. Are you currently experiencing lightheadedness and shortness of breath? Little bit of SOB  3. Have you checked your BP and heart rate? (document readings) Few minutes ago 96/61 HR 81, nothing prior  4. Are you experiencing any other symptoms? No, he can just tell is HR isn't steady and tiresome.  Pt voiced he's having palpitations and wants to know if he need to make changes or wait for medicine to build up into his system.  Please follow up with pt. Thanks!

## 2015-11-25 NOTE — Telephone Encounter (Signed)
Follow up   Pt verbalized that he is only calling to give his other number as a means of contact

## 2015-11-25 NOTE — Telephone Encounter (Signed)
Returned call to patient.  He says since stopping the Metoprolol his HR's have been high.  Some as high as 150 bpm.  Both his Cardizem and Metoprolol were stopped and Amiodarone was started on 11/20/15.  His HR at rest is running around 102.  He has an EKG scheduled for tomorrow morning. I have asked him to take 25 mg of Metoprolol now and will see what his HR is like in the morning and discuss with Dr [name].

## 2015-11-26 ENCOUNTER — Ambulatory Visit (INDEPENDENT_AMBULATORY_CARE_PROVIDER_SITE_OTHER): Payer: Medicare Other | Admitting: *Deleted

## 2015-11-26 DIAGNOSIS — I4892 Unspecified atrial flutter: Secondary | ICD-10-CM | POA: Diagnosis not present

## 2015-11-26 MED ORDER — METOPROLOL SUCCINATE ER 50 MG PO TB24: 50.0000 mg | ORAL_TABLET | Freq: Every day | ORAL | 3 refills | Status: DC

## 2015-11-26 NOTE — Patient Instructions (Signed)
Medication Instructions:  Your physician has recommended you make the following change in your medication:  1) restart Metoprolol at 50 mg daily    Labwork: None ordered    Testing/Procedures: None ordered   Follow-Up: Your physician recommends that you schedule a follow-up appointment as scheduled   Any Other Special Instructions Will Be Listed Below (If Applicable).     If you need a refill on your cardiac medications before your next appointment, please call your pharmacy.

## 2015-11-26 NOTE — Progress Notes (Signed)
Patient here for an EKG after starting Amiodarone.  His HR's yesterday wer in the 130-150 range with activity.  I advised patient to take 25 mg of Metoprolol last night to slow his rate down.  This morning his HR was 108.  Discussed with Dr [name] and advised he restart his Metoprolol at 50 mg qhs.  He is going to keep a watch on his HR and call if it drops below 60.  He feels much better when it is lower.  He will keep his follow up in 3 week

## 2015-11-27 NOTE — Telephone Encounter (Signed)
Dr [name] advises patient to restart Metoprolol 50mg  qhs.  Please see nurse room visit

## 2015-11-28 ENCOUNTER — Telehealth: Payer: Self-pay | Admitting: Internal Medicine

## 2015-11-28 NOTE — Telephone Encounter (Signed)
Spoke with patient and his HR is still staying around 105 at rest.  Let him know I would discuss with Dr [name] and call him tomorrow with a plan

## 2015-11-28 NOTE — Telephone Encounter (Signed)
New Message:    Pt says his heart rate is staying above 100. He wants to know if you think his medicine needs to be adjusted?

## 2015-11-29 NOTE — Telephone Encounter (Signed)
Spoke with Dr. [name] about patient's heart rate.  He advised patient continue current medications and come in for EKG/nurse visit early next week.  He states patient may cancel appointment if HR has normalized.  Spoke with patient and reviewed Dr. Tanna Furry advice.  He is scheduled for ekg/nurse visit on Tues. August 29.  He is aware to call back today or over the weekend to speak with the on-call provider with questions or concerns regarding heart rate.  He is aware to call back next week to cancel EKG if heart rate normalizes.  He asked if he should continue Toprol and I advised per Dr. [name], patient should continue unless HR slows to point where he feels poor (dizziness, light-headedness, fatigue).  He verbalized understanding and agreement with plan.

## 2015-12-03 ENCOUNTER — Ambulatory Visit (INDEPENDENT_AMBULATORY_CARE_PROVIDER_SITE_OTHER): Payer: Medicare Other | Admitting: *Deleted

## 2015-12-03 VITALS — BP 114/68 | HR 97 | Wt 193.0 lb

## 2015-12-03 DIAGNOSIS — I480 Paroxysmal atrial fibrillation: Secondary | ICD-10-CM

## 2015-12-03 NOTE — Progress Notes (Signed)
1.) Reason for visit: EKG.  Elevated heart rate  2.) Name of MD requesting visit: Taylor  3.) H&P: Pt here for EKG after calling with elevated heart rate.  4.) ROS related to problem: Pt reports heart rate was in 80's until this past Sunday when it went to 90's. Staying around 96-98.  He called office and EKG was scheduled for today. Toprol was recently increased to 50 mg daily. Pt reports he is feeling fine and able to do normal activities.  He has machine at home to check heart rate.    5.) Assessment and plan per MD:  EKG done and reviewed with Dr. [name].  Pt to continue same medications. If heart rate decreases to 70-80 on a consistent basis he should decrease Toprol to 25 daily. If heart rate stays below 70 he should contact office for instructions.  Pt aware of follow up appt with Dr. [name] on 12/24/15

## 2015-12-04 DIAGNOSIS — I2510 Atherosclerotic heart disease of native coronary artery without angina pectoris: Secondary | ICD-10-CM | POA: Diagnosis not present

## 2015-12-04 DIAGNOSIS — E785 Hyperlipidemia, unspecified: Secondary | ICD-10-CM | POA: Diagnosis not present

## 2015-12-06 ENCOUNTER — Telehealth: Payer: Self-pay | Admitting: Internal Medicine

## 2015-12-06 NOTE — Telephone Encounter (Signed)
New Message:   Pulse rate have been dropping in the low sixties,sometimes as low as 62. He wants to know what to do about his Metoprolol. Please call to advise.

## 2015-12-10 NOTE — Telephone Encounter (Signed)
He is now taking Metoprolol 25 mg daily and his HR is running 69.  I have advised him to stay on this dose and if he begins to feel fatigue and like his HR is not staying up then he can reduce his dose of Metoprolol to 12.5 mg daily.  He will call me back if needed

## 2015-12-16 DIAGNOSIS — E785 Hyperlipidemia, unspecified: Secondary | ICD-10-CM | POA: Diagnosis not present

## 2015-12-16 DIAGNOSIS — Z0000 Encounter for general adult medical examination without abnormal findings: Secondary | ICD-10-CM | POA: Diagnosis not present

## 2015-12-24 ENCOUNTER — Encounter: Payer: Self-pay | Admitting: Internal Medicine

## 2015-12-24 ENCOUNTER — Ambulatory Visit (INDEPENDENT_AMBULATORY_CARE_PROVIDER_SITE_OTHER): Payer: Medicare Other | Admitting: Internal Medicine

## 2015-12-24 VITALS — BP 114/74 | HR 76 | Ht 72.0 in | Wt 192.6 lb

## 2015-12-24 DIAGNOSIS — I480 Paroxysmal atrial fibrillation: Secondary | ICD-10-CM

## 2015-12-24 MED ORDER — AMIODARONE HCL 200 MG PO TABS: 200.0000 mg | ORAL_TABLET | Freq: Every day | ORAL | 3 refills | Status: DC

## 2015-12-24 NOTE — Patient Instructions (Addendum)
Medication Instructions:  Your physician has recommended you make the following change in your medication:  l1) START ON 02/05/16 ---Amiodarone 1 tablet daily.   Labwork: None Ordered   Testing/Procedures: None Ordered   Follow-Up: Your physician recommends that you schedule a follow-up appointment February 2018 with Dr. [name].  Any Other Special Instructions Will Be Listed Below (If Applicable).     If you need a refill on your cardiac medications before your next appointment, please call your pharmacy.

## 2015-12-24 NOTE — Progress Notes (Signed)
HPI Mr. Lepage returns today for ongoing followup of multiple atrial arrhythmias. He is a pleasant 75 yo man with PAF, flutter and Atrial tachycardia. He has been started on amiodarone and has returned to NSR. He feels well. Maybe a very mild tremor. No other complaints.   Allergies  Allergen Reactions  . Brilinta [Ticagrelor] Itching and Rash  . Penicillins Rash    Has patient had a PCN reaction causing immediate rash, facial/tongue/throat swelling, SOB or lightheadedness with hypotension: Yes Has patient had a PCN reaction causing severe rash involving mucus membranes or skin necrosis: Yes Has patient had a PCN reaction that required hospitalization No Has patient had a PCN reaction occurring within the last 10 years: No If all of the above answers are "NO", then may proceed with Cephalosporin use.      Current Outpatient Prescriptions  Medication Sig Dispense Refill  . amiodarone (PACERONE) 200 MG tablet Take 1 tablet (200 mg total) by mouth 2 (two) times daily. 180 tablet 3  . apixaban (ELIQUIS) 5 MG TABS tablet Take 5 mg by mouth 2 (two) times daily.    . Ascorbic Acid (VITAMIN C) 500 MG tablet Take 500 mg by mouth 2 (two) times daily.      Marland Kitchen aspirin 81 MG tablet Take 81 mg by mouth daily.    Marland Kitchen b complex vitamins tablet Take 1 tablet by mouth daily.    . Cholecalciferol (VITAMIN D3 PO) Take 1 capsule by mouth daily.    . Coenzyme Q10 (EQL COQ10) 300 MG CAPS Take 1 capsule by mouth every morning.     Marland Kitchen glucosamine-chondroitin 500-400 MG tablet Take 1 tablet by mouth 2 (two) times daily.     Marland Kitchen HAWTHORN BERRY PO Take 1 capsule by mouth every morning.     . Magnesium 250 MG TABS Take 250 mg by mouth 2 (two) times daily.     . metoprolol succinate (TOPROL-XL) 25 MG 24 hr tablet Take 12.5 mg by mouth daily.    . Multiple Vitamin (MULTIVITAMIN WITH MINERALS) TABS Take 1 tablet by mouth every morning.     . nitroGLYCERIN (NITROSTAT) 0.4 MG SL tablet Place 0.4 mg under the  tongue every 5 (five) minutes as needed for chest pain.     Marland Kitchen OVER THE COUNTER MEDICATION Take 1,000 mg by mouth every morning. BILLBERRY CAPSULE 1000MG     . Potassium (POTASSIMIN PO) Take 1 tablet by mouth daily.    . pravastatin (PRAVACHOL) 40 MG tablet Take 40 mg by mouth at bedtime.     . ranitidine (ZANTAC) 75 MG tablet Take 75 mg by mouth 2 (two) times daily.    . saw palmetto 160 MG capsule Take 160 mg by mouth 2 (two) times daily.     . vitamin E 400 UNIT capsule Take 400 Units by mouth daily.     Marland Kitchen VITAMIN K PO Take 1 capsule by mouth daily.     No current facility-administered medications for this visit.      Past Medical History:  Diagnosis Date  . Arthritis   . Chronic lower back pain    "last 3 months; usually when I bend" (12/24/2011)  . Coronary artery disease   . Endocarditis   . GERD (gastroesophageal reflux disease)   . Heart murmur   . High cholesterol   . Hypertension   . Migraines    "none in the last couple years" (12/24/2011)  . Myocardial infarction [facility])    POSSIBLE  2012 ELEVATED ENZYMES  . Pneumonia ~ 2011    ROS:   All systems reviewed and negative except as noted in the HPI.   Past Surgical History:  Procedure Laterality Date  . CARDIOVERSION N/A 10/29/2015   Procedure: CARDIOVERSION;  Surgeon: Adrian Prows, MD;  Location: [place];  Service: Cardiovascular;  Laterality: N/A;  . COLON RESECTION  12/25/2011   Procedure: COLON RESECTION LAPAROSCOPIC;  Surgeon: Shann Medal, MD;  Location: [place];  Service: General;  Laterality: Right;  laparoscopic assisted right colon resection  . COLONOSCOPY  11/11/2011   Procedure: COLONOSCOPY;  Surgeon: Missy Sabins, MD;  Location: [place];  Service: Endoscopy;  Laterality: N/A;  . CORONARY ANGIOPLASTY WITH STENT PLACEMENT  11/11/2010   "1"  . CYSTECTOMY  ~ 1980   right eyelid  . TEE WITHOUT CARDIOVERSION  11/06/2011   Procedure: TRANSESOPHAGEAL ECHOCARDIOGRAM (TEE);  Surgeon: Laverda Page, MD;   Location: [facility] ENDOSCOPY;  Service: Cardiovascular;  Laterality: N/A;     Family History  Problem Relation Age of Onset  . Hypertension Mother   . Cancer Neg Hx   . Diabetes Neg Hx   . Heart failure Neg Hx   . Hyperlipidemia Neg Hx   . Sudden death Neg Hx   . Stroke Neg Hx   . Heart attack Neg Hx      Social History   Social History  . Marital status: Married    Spouse name: N/A  . Number of children: N/A  . Years of education: N/A   Occupational History  . Not on file.   Social History Main Topics  . Smoking status: Never Smoker  . Smokeless tobacco: Never Used     Comment: 12/24/2011 "used to puff cigarettes; never inhaled"  . Alcohol use No  . Drug use: No  . Sexual activity: No   Other Topics Concern  . Not on file   Social History Narrative  . No narrative on file     BP 114/74   Pulse 76   Ht 6' (1.829 m)   Wt 192 lb 9.6 oz (87.4 kg)   BMI 26.12 kg/m   Physical Exam:  Well appearing 75 yo man, NAD HEENT: Unremarkable Neck:  6 cm JVD, no thyromegally Lymphatics:  No adenopathy Back:  No CVA tenderness Lungs:  Clear with no wheezes HEART:  Regular rate rhythm, no murmurs, no rubs, no clicks Abd:  soft, positive bowel sounds, no organomegally, no rebound, no guarding Ext:  2 plus pulses, no edema, no cyanosis, no clubbing Skin:  No rashes no nodules Neuro:  CN II through XII intact, motor grossly intact  EKG - NSR with LBBB   Assess/Plan: 1. PAF/SVT/Atrial flutter - he has returned to NSR. He feels well. He will continue amiodarone. I have asked him to reduce his dose to 200 mg daily in 6 weeks. 2. CAD - he denies anginal symptoms. 3. HTN - he may require additional adjustments in his meds once the calcium channel and beta blocker are stopped 4. Coags - he will continue his Eliquis for now.  Mikle Bosworth.D.

## 2016-01-06 DIAGNOSIS — Z5181 Encounter for therapeutic drug level monitoring: Secondary | ICD-10-CM | POA: Diagnosis not present

## 2016-01-06 DIAGNOSIS — Q244 Congenital subaortic stenosis: Secondary | ICD-10-CM | POA: Diagnosis not present

## 2016-01-06 DIAGNOSIS — I480 Paroxysmal atrial fibrillation: Secondary | ICD-10-CM | POA: Diagnosis not present

## 2016-01-06 DIAGNOSIS — I2510 Atherosclerotic heart disease of native coronary artery without angina pectoris: Secondary | ICD-10-CM | POA: Diagnosis not present

## 2016-03-04 ENCOUNTER — Telehealth: Payer: Self-pay | Admitting: Internal Medicine

## 2016-03-04 NOTE — Telephone Encounter (Signed)
Called pt. Informed Dr. [name] recommended pt to call back if symptoms return or get worse. No changes for now. Pt verbalized understanding and agreed with plan.

## 2016-03-04 NOTE — Telephone Encounter (Signed)
New message       Patient c/o Palpitations:  High priority if patient c/o lightheadedness and shortness of breath.  1. How long have you been having palpitations?  Last night first night  2. Are you currently experiencing lightheadedness and shortness of breath? no 3. Have you checked your BP and heart rate? (document readings)  140/71 HR 58 this am---last night HR was 120-130 for a while 4. Are you experiencing any other symptoms?  no

## 2016-03-04 NOTE — Telephone Encounter (Signed)
Called, spoke with [name]. [name] stated he had palpitations for 1 1/2 hrs last night after eating fast food. HR was 120s-130s. [name] stated heart felt "fluttery". [name] denied CP or SOB. Informed I will forward information to Dr. [name] to advise. Informed if symptoms return or get worse before I call back, to call our office. [name] verbalized understanding and agreed with plan.

## 2016-03-09 DIAGNOSIS — H18413 Arcus senilis, bilateral: Secondary | ICD-10-CM | POA: Diagnosis not present

## 2016-03-09 DIAGNOSIS — H40002 Preglaucoma, unspecified, left eye: Secondary | ICD-10-CM | POA: Diagnosis not present

## 2016-03-09 DIAGNOSIS — H11153 Pinguecula, bilateral: Secondary | ICD-10-CM | POA: Diagnosis not present

## 2016-03-09 DIAGNOSIS — H11423 Conjunctival edema, bilateral: Secondary | ICD-10-CM | POA: Diagnosis not present

## 2016-03-09 DIAGNOSIS — H40013 Open angle with borderline findings, low risk, bilateral: Secondary | ICD-10-CM | POA: Diagnosis not present

## 2016-03-09 DIAGNOSIS — H5213 Myopia, bilateral: Secondary | ICD-10-CM | POA: Diagnosis not present

## 2016-03-09 DIAGNOSIS — H35033 Hypertensive retinopathy, bilateral: Secondary | ICD-10-CM | POA: Diagnosis not present

## 2016-03-09 DIAGNOSIS — H43812 Vitreous degeneration, left eye: Secondary | ICD-10-CM | POA: Diagnosis not present

## 2016-03-09 DIAGNOSIS — H52223 Regular astigmatism, bilateral: Secondary | ICD-10-CM | POA: Diagnosis not present

## 2016-03-09 DIAGNOSIS — H25013 Cortical age-related cataract, bilateral: Secondary | ICD-10-CM | POA: Diagnosis not present

## 2016-03-09 DIAGNOSIS — Z79899 Other long term (current) drug therapy: Secondary | ICD-10-CM | POA: Diagnosis not present

## 2016-03-09 DIAGNOSIS — H2513 Age-related nuclear cataract, bilateral: Secondary | ICD-10-CM | POA: Diagnosis not present

## 2016-03-18 ENCOUNTER — Telehealth: Payer: Self-pay | Admitting: Internal Medicine

## 2016-03-18 DIAGNOSIS — R002 Palpitations: Secondary | ICD-10-CM

## 2016-03-18 NOTE — Telephone Encounter (Signed)
Called, spoke with pt. Pt stated HR 100-120s. Pt denies CP. Pt stated a little SOB. Dr. [name] recommended 48 hour holter monitor. Informed pt to call our office if symptoms get worse. Informed if pt has CP/SOB/dizziness report to emergency department. Pt verbalized understanding and agreed with plan.

## 2016-03-18 NOTE — Telephone Encounter (Signed)
New message   Patient c/o Palpitations:  High priority if patient c/o lightheadedness and shortness of breath.  1. How long have you been having palpitations? 03/18/16 7:30am  2. Are you currently experiencing lightheadedness and shortness of breath? no  3. Have you checked your BP and heart rate? (document readings) 116/81 112          HR 35 and 113 4. Are you experiencing any other symptoms?  no

## 2016-03-20 ENCOUNTER — Ambulatory Visit (INDEPENDENT_AMBULATORY_CARE_PROVIDER_SITE_OTHER): Payer: Medicare Other

## 2016-03-20 DIAGNOSIS — R002 Palpitations: Secondary | ICD-10-CM

## 2016-05-05 ENCOUNTER — Encounter (HOSPITAL_COMMUNITY): Payer: Self-pay

## 2016-05-05 DIAGNOSIS — R002 Palpitations: Secondary | ICD-10-CM | POA: Diagnosis not present

## 2016-05-05 DIAGNOSIS — I421 Obstructive hypertrophic cardiomyopathy: Secondary | ICD-10-CM | POA: Diagnosis present

## 2016-05-05 DIAGNOSIS — E860 Dehydration: Secondary | ICD-10-CM | POA: Diagnosis present

## 2016-05-05 DIAGNOSIS — I480 Paroxysmal atrial fibrillation: Secondary | ICD-10-CM | POA: Diagnosis present

## 2016-05-05 DIAGNOSIS — I2510 Atherosclerotic heart disease of native coronary artery without angina pectoris: Secondary | ICD-10-CM | POA: Diagnosis present

## 2016-05-05 DIAGNOSIS — I4892 Unspecified atrial flutter: Secondary | ICD-10-CM | POA: Diagnosis present

## 2016-05-05 DIAGNOSIS — I959 Hypotension, unspecified: Secondary | ICD-10-CM | POA: Diagnosis not present

## 2016-05-05 DIAGNOSIS — D509 Iron deficiency anemia, unspecified: Secondary | ICD-10-CM | POA: Diagnosis present

## 2016-05-05 DIAGNOSIS — N179 Acute kidney failure, unspecified: Secondary | ICD-10-CM | POA: Diagnosis present

## 2016-05-05 DIAGNOSIS — Z955 Presence of coronary angioplasty implant and graft: Secondary | ICD-10-CM

## 2016-05-05 DIAGNOSIS — I350 Nonrheumatic aortic (valve) stenosis: Secondary | ICD-10-CM | POA: Diagnosis present

## 2016-05-05 DIAGNOSIS — I252 Old myocardial infarction: Secondary | ICD-10-CM

## 2016-05-05 DIAGNOSIS — Z7901 Long term (current) use of anticoagulants: Secondary | ICD-10-CM

## 2016-05-05 DIAGNOSIS — R0602 Shortness of breath: Secondary | ICD-10-CM | POA: Diagnosis not present

## 2016-05-05 DIAGNOSIS — Z79899 Other long term (current) drug therapy: Secondary | ICD-10-CM

## 2016-05-05 DIAGNOSIS — K219 Gastro-esophageal reflux disease without esophagitis: Secondary | ICD-10-CM | POA: Diagnosis present

## 2016-05-05 DIAGNOSIS — I10 Essential (primary) hypertension: Secondary | ICD-10-CM | POA: Diagnosis present

## 2016-05-05 DIAGNOSIS — I9589 Other hypotension: Secondary | ICD-10-CM | POA: Diagnosis not present

## 2016-05-05 DIAGNOSIS — E785 Hyperlipidemia, unspecified: Secondary | ICD-10-CM | POA: Diagnosis present

## 2016-05-05 NOTE — ED Triage Notes (Signed)
Pt states he started feeling like his heart was racing at 4 pm this afternoon after eating; Pt a&ox 4 on arrival. Pt denies pain on arrival.pt states hx of Afib with meds.  Pt states he has mild SOB; pt speaking in full and complete sentences;

## 2016-05-06 ENCOUNTER — Emergency Department (HOSPITAL_COMMUNITY): Payer: Medicare Other

## 2016-05-06 ENCOUNTER — Encounter (HOSPITAL_COMMUNITY): Payer: Self-pay | Admitting: Family Medicine

## 2016-05-06 ENCOUNTER — Inpatient Hospital Stay (HOSPITAL_COMMUNITY)
Admission: EM | Admit: 2016-05-06 | Discharge: 2016-05-07 | DRG: 315 | Disposition: A | Discharge status: Home / Self Care | Payer: Medicare Other | Attending: Internal Medicine | Admitting: Internal Medicine

## 2016-05-06 DIAGNOSIS — I9589 Other hypotension: Secondary | ICD-10-CM

## 2016-05-06 DIAGNOSIS — Z79899 Other long term (current) drug therapy: Secondary | ICD-10-CM | POA: Diagnosis not present

## 2016-05-06 DIAGNOSIS — K219 Gastro-esophageal reflux disease without esophagitis: Secondary | ICD-10-CM | POA: Diagnosis present

## 2016-05-06 DIAGNOSIS — I10 Essential (primary) hypertension: Secondary | ICD-10-CM | POA: Diagnosis not present

## 2016-05-06 DIAGNOSIS — D509 Iron deficiency anemia, unspecified: Secondary | ICD-10-CM | POA: Diagnosis not present

## 2016-05-06 DIAGNOSIS — E860 Dehydration: Secondary | ICD-10-CM

## 2016-05-06 DIAGNOSIS — I4891 Unspecified atrial fibrillation: Secondary | ICD-10-CM | POA: Diagnosis not present

## 2016-05-06 DIAGNOSIS — I959 Hypotension, unspecified: Secondary | ICD-10-CM | POA: Diagnosis not present

## 2016-05-06 DIAGNOSIS — I4892 Unspecified atrial flutter: Secondary | ICD-10-CM | POA: Diagnosis not present

## 2016-05-06 DIAGNOSIS — N179 Acute kidney failure, unspecified: Secondary | ICD-10-CM | POA: Diagnosis present

## 2016-05-06 DIAGNOSIS — Z955 Presence of coronary angioplasty implant and graft: Secondary | ICD-10-CM | POA: Diagnosis not present

## 2016-05-06 DIAGNOSIS — E785 Hyperlipidemia, unspecified: Secondary | ICD-10-CM | POA: Diagnosis not present

## 2016-05-06 DIAGNOSIS — I2510 Atherosclerotic heart disease of native coronary artery without angina pectoris: Secondary | ICD-10-CM | POA: Diagnosis not present

## 2016-05-06 DIAGNOSIS — I350 Nonrheumatic aortic (valve) stenosis: Secondary | ICD-10-CM | POA: Diagnosis present

## 2016-05-06 DIAGNOSIS — I421 Obstructive hypertrophic cardiomyopathy: Secondary | ICD-10-CM | POA: Diagnosis present

## 2016-05-06 DIAGNOSIS — I252 Old myocardial infarction: Secondary | ICD-10-CM | POA: Diagnosis not present

## 2016-05-06 DIAGNOSIS — Z7901 Long term (current) use of anticoagulants: Secondary | ICD-10-CM | POA: Diagnosis not present

## 2016-05-06 DIAGNOSIS — R0602 Shortness of breath: Secondary | ICD-10-CM | POA: Diagnosis not present

## 2016-05-06 DIAGNOSIS — I480 Paroxysmal atrial fibrillation: Secondary | ICD-10-CM | POA: Diagnosis not present

## 2016-05-06 DIAGNOSIS — I422 Other hypertrophic cardiomyopathy: Secondary | ICD-10-CM | POA: Diagnosis not present

## 2016-05-06 DIAGNOSIS — R002 Palpitations: Secondary | ICD-10-CM | POA: Diagnosis not present

## 2016-05-06 LAB — CBC
HEMATOCRIT: 34.5 % — AB (ref 39.0–52.0)
Hemoglobin: 11.5 g/dL — ABNORMAL LOW (ref 13.0–17.0)
MCH: 30.6 pg (ref 26.0–34.0)
MCHC: 33.3 g/dL (ref 30.0–36.0)
MCV: 91.8 fL (ref 78.0–100.0)
Platelets: 155 10*3/uL (ref 150–400)
RBC: 3.76 MIL/uL — ABNORMAL LOW (ref 4.22–5.81)
RDW: 15.9 % — AB (ref 11.5–15.5)
WBC: 6 10*3/uL (ref 4.0–10.5)

## 2016-05-06 LAB — MAGNESIUM: Magnesium: 2.3 mg/dL (ref 1.7–2.4)

## 2016-05-06 LAB — BASIC METABOLIC PANEL
Anion gap: 14 (ref 5–15)
BUN: 21 mg/dL — AB (ref 6–20)
CALCIUM: 9.6 mg/dL (ref 8.9–10.3)
CO2: 25 mmol/L (ref 22–32)
Chloride: 100 mmol/L — ABNORMAL LOW (ref 101–111)
Creatinine, Ser: 1.51 mg/dL — ABNORMAL HIGH (ref 0.61–1.24)
GFR calc Af Amer: 51 mL/min — ABNORMAL LOW (ref >60)
GFR, EST NON AFRICAN AMERICAN: 44 mL/min — AB (ref >60)
Glucose, Bld: 111 mg/dL — ABNORMAL HIGH (ref 65–99)
Potassium: 4 mmol/L (ref 3.5–5.1)
Sodium: 139 mmol/L (ref 135–145)

## 2016-05-06 LAB — URINALYSIS, ROUTINE W REFLEX MICROSCOPIC
BILIRUBIN URINE: NEGATIVE
Glucose, UA: NEGATIVE mg/dL
HGB URINE DIPSTICK: NEGATIVE
KETONES UR: NEGATIVE mg/dL
Leukocytes, UA: NEGATIVE
NITRITE: NEGATIVE
Protein, ur: NEGATIVE mg/dL
Specific Gravity, Urine: 1.021 (ref 1.005–1.030)
pH: 5 (ref 5.0–8.0)

## 2016-05-06 LAB — TSH: TSH: 2.572 u[IU]/mL (ref 0.350–4.500)

## 2016-05-06 LAB — TROPONIN I: TROPONIN I: 0.05 ng/mL — AB (ref <0.03)

## 2016-05-06 MED ORDER — APIXABAN 5 MG PO TABS
5.0000 mg | ORAL_TABLET | Freq: Two times a day (BID) | ORAL | Status: DC
  Administered 2016-05-06 – 2016-05-07 (×3): 5 mg via ORAL
  Filled 2016-05-06 (×3): qty 1

## 2016-05-06 MED ORDER — FAMOTIDINE 20 MG PO TABS
20.0000 mg | ORAL_TABLET | Freq: Two times a day (BID) | ORAL | Status: DC
  Administered 2016-05-06 – 2016-05-07 (×2): 20 mg via ORAL
  Filled 2016-05-06 (×2): qty 1

## 2016-05-06 MED ORDER — AMIODARONE HCL 200 MG PO TABS
200.0000 mg | ORAL_TABLET | Freq: Every day | ORAL | Status: DC
  Administered 2016-05-06 – 2016-05-07 (×2): 200 mg via ORAL
  Filled 2016-05-06 (×2): qty 1

## 2016-05-06 MED ORDER — MAGNESIUM GLUCONATE 500 MG PO TABS
250.0000 mg | ORAL_TABLET | Freq: Every day | ORAL | Status: DC
  Administered 2016-05-06 – 2016-05-07 (×2): 250 mg via ORAL
  Filled 2016-05-06 (×3): qty 1

## 2016-05-06 MED ORDER — SODIUM CHLORIDE 0.9 % IV BOLUS (SEPSIS)
250.0000 mL | Freq: Once | INTRAVENOUS | Status: AC
  Administered 2016-05-06: 250 mL via INTRAVENOUS

## 2016-05-06 MED ORDER — MAGNESIUM 200 MG PO TABS
250.0000 mg | ORAL_TABLET | Freq: Two times a day (BID) | ORAL | Status: DC
  Filled 2016-05-06: qty 2

## 2016-05-06 MED ORDER — SODIUM CHLORIDE 0.9 % IV BOLUS (SEPSIS)
1000.0000 mL | Freq: Once | INTRAVENOUS | Status: AC
  Administered 2016-05-06: 1000 mL via INTRAVENOUS

## 2016-05-06 MED ORDER — METOPROLOL SUCCINATE ER 25 MG PO TB24
25.0000 mg | ORAL_TABLET | Freq: Every day | ORAL | Status: DC
  Administered 2016-05-06 – 2016-05-07 (×2): 25 mg via ORAL
  Filled 2016-05-06 (×2): qty 1

## 2016-05-06 MED ORDER — SODIUM CHLORIDE 0.9 % IV SOLN
INTRAVENOUS | Status: DC
  Administered 2016-05-06 – 2016-05-07 (×3): via INTRAVENOUS

## 2016-05-06 NOTE — ED Notes (Signed)
Dr. Eliseo Squires notified that troponin resulted at 0.05.

## 2016-05-06 NOTE — H&P (Signed)
History and Physical  Patient Name: [name]     [id number]    DOB: 04-12-1940    DOA: 05/06/2016 PCP: [name], MD   Patient coming from: Visiting wife in hospital  Chief Complaint: Palpitations  HPI: [name] is a 76 y.o. male with a past medical history significant for HOCM, Afib/Aflutter/atrial tach on apixaban, CAD s/p PCI x1 who presents with palpitations  The patient was visiting his wife this afternoon in the rehab wing when he had onset of palpitations, SOB and vague chest discomfort.  This felt similar to previous episodes of Afib, so he came down to the ER.  There was no exertional chest pain, no severe SOB.  There was no fever, cough, sputum production, dysuria, hematuria.  No recent vomiting, diarrhea, decreased PO intake.  ED course: -Afebrile, heart rate 133, respirations and pulse ox normal, BP 92/63 (his baseline is 100/70) -Na 139, K 4.0, Cr 1.51 (baseline 1.1), WBC 6K, Hgb 11 -CXR clear -ECG rate 129, wide complex tachcyardia, P waves not distinguishable -He was given 1L NS for dehydration, and his BP dropped to 80 and Q000111Q systolic -He was given a second liter NS and his BP stayed persistently in the 70s -He was not febrile, and felt near his baseline, somewhat better with eating -HR improved from initial 130s to 80s, and BP improved -TRH were asked to evaluate for hypotension     ROS: Review of Systems  Constitutional: Negative for chills and fever.  HENT: Negative for sore throat.   Respiratory: Positive for shortness of breath.   Cardiovascular: Positive for palpitations. Negative for chest pain.  Gastrointestinal: Negative for abdominal pain, diarrhea and vomiting.  Genitourinary: Negative for dysuria, frequency and urgency.  Musculoskeletal: Negative for myalgias.  Neurological: Negative for dizziness and loss of consciousness.  All other systems reviewed and are negative.         Past Medical History:  Diagnosis Date  .  Arthritis   . Chronic lower back pain    "last 3 months; usually when I bend" (12/24/2011)  . Coronary artery disease   . Endocarditis   . GERD (gastroesophageal reflux disease)   . Heart murmur   . High cholesterol   . Hypertension   . Migraines    "none in the last couple years" (12/24/2011)  . Myocardial infarction    POSSIBLE 2012 ELEVATED ENZYMES  . Pneumonia ~ 2011    Past Surgical History:  Procedure Laterality Date  . CARDIOVERSION N/A 10/29/2015   Procedure: CARDIOVERSION;  Surgeon: Adrian Prows, MD;  Location: [place];  Service: Cardiovascular;  Laterality: N/A;  . COLON RESECTION  12/25/2011   Procedure: COLON RESECTION LAPAROSCOPIC;  Surgeon: Shann Medal, MD;  Location: [place];  Service: General;  Laterality: Right;  laparoscopic assisted right colon resection  . COLONOSCOPY  11/11/2011   Procedure: COLONOSCOPY;  Surgeon: Missy Sabins, MD;  Location: [place];  Service: Endoscopy;  Laterality: N/A;  . CORONARY ANGIOPLASTY WITH STENT PLACEMENT  11/11/2010   "1"  . CYSTECTOMY  ~ 1980   right eyelid  . TEE WITHOUT CARDIOVERSION  11/06/2011   Procedure: TRANSESOPHAGEAL ECHOCARDIOGRAM (TEE);  Surgeon: Laverda Page, MD;  Location: [place];  Service: Cardiovascular;  Laterality: N/A;    Social History: Patient lives with his wife.  The patient walks unassisted.  He is formerly employed in [employer].  He is from near [place], has lived here in [place] for last 40 years.  He did not smoke.    Allergies  Allergen Reactions  . Brilinta [Ticagrelor] Itching and Rash  . Penicillins Rash    Has patient had a PCN reaction causing immediate rash, facial/tongue/throat swelling, SOB or lightheadedness with hypotension: Yes Has patient had a PCN reaction causing severe rash involving mucus membranes or skin necrosis: Yes Has patient had a PCN reaction that required hospitalization No Has patient had a PCN reaction occurring within the last 10 years: No If all of the  above answers are "NO", then may proceed with Cephalosporin use.     Family history: family history includes Heart failure in his father; Hypertension in his mother.  Prior to Admission medications   Medication Sig Start Date End Date Taking? Authorizing Provider  amiodarone (PACERONE) 200 MG tablet Take 1 tablet (200 mg total) by mouth daily. 02/05/16  Yes Evans Lance, MD  apixaban (ELIQUIS) 5 MG TABS tablet Take 5 mg by mouth 2 (two) times daily.   Yes Historical Provider, MD  Ascorbic Acid (VITAMIN C) 500 MG tablet Take 500 mg by mouth 2 (two) times daily.     Yes Historical Provider, MD  b complex vitamins tablet Take 1 tablet by mouth daily.   Yes Historical Provider, MD  Cholecalciferol (VITAMIN D3 PO) Take 1 capsule by mouth daily.   Yes Historical Provider, MD  Coenzyme Q10 (EQL COQ10) 300 MG CAPS Take 1 capsule by mouth every morning.    Yes Historical Provider, MD  glucosamine-chondroitin 500-400 MG tablet Take 1 tablet by mouth 2 (two) times daily.    Yes Historical Provider, MD  HAWTHORN BERRY PO Take 1 capsule by mouth every morning.    Yes Historical Provider, MD  Magnesium 250 MG TABS Take 250 mg by mouth 2 (two) times daily.    Yes Historical Provider, MD  metoprolol succinate (TOPROL-XL) 25 MG 24 hr tablet Take 12.5 mg by mouth daily.   Yes Historical Provider, MD  Multiple Vitamin (MULTIVITAMIN WITH MINERALS) TABS Take 1 tablet by mouth every morning.    Yes Historical Provider, MD  nitroGLYCERIN (NITROSTAT) 0.4 MG SL tablet Place 0.4 mg under the tongue every 5 (five) minutes as needed for chest pain.  11/06/15  Yes Historical Provider, MD  OVER THE COUNTER MEDICATION Take 1,000 mg by mouth every morning. BILLBERRY CAPSULE 1000MG    Yes Historical Provider, MD  Potassium (POTASSIMIN PO) Take 1 tablet by mouth daily.   Yes Historical Provider, MD  pravastatin (PRAVACHOL) 40 MG tablet Take 40 mg by mouth at bedtime.    Yes Historical Provider, MD  ranitidine (ZANTAC) 75 MG  tablet Take 75 mg by mouth 2 (two) times daily.   Yes Historical Provider, MD  saw palmetto 160 MG capsule Take 160 mg by mouth 2 (two) times daily.    Yes Historical Provider, MD  vitamin E 400 UNIT capsule Take 400 Units by mouth daily.    Yes Historical Provider, MD  VITAMIN K PO Take 1 capsule by mouth daily.   Yes Historical Provider, MD       Physical Exam: BP (!) 83/54   Pulse 82   Temp 98.3 F (36.8 C) (Oral)   Resp 15   SpO2 97%  General appearance: Well-developed, adult male, alert and in no acute distress.   Eyes: Anicteric, conjunctiva pink, lids and lashes normal. PERRL.    ENT: No nasal deformity, discharge, epistaxis.  Hearing normal. OP moist without lesions.   Neck: No neck masses.  Trachea  midline.  No thyromegaly/tenderness. Lymph: No cervical or supraclavicular lymphadenopathy. Skin: Warm and dry.  Pale.  No jaundice.  No suspicious rashes or lesions. Cardiac: Regular rate, irregular, nl Q000111Q, loud systolic murmur.  Capillary refill is brisk.  JVP not visible.  No LE edema.  Radial pulses 2+ and symmetric. Respiratory: Normal respiratory rate and rhythm.  CTAB without rales or wheezes. Abdomen: Abdomen soft.  No TTP. No ascites, distension, hepatosplenomegaly.   MSK: No deformities or effusions.  No cyanosis or clubbing. Neuro: Cranial nerves normal.  Sensation intact to light touch. Speech is fluent.  Muscle strength normal.    Psych: Sensorium intact and responding to questions, attention normal.  Behavior appropriate.  Affect normal.  Judgment and insight appear normal.     Labs on Admission:  I have personally reviewed following labs and imaging studies: CBC:  Recent Labs Lab 05/06/16 0001  WBC 6.0  HGB 11.5*  HCT 34.5*  MCV 91.8  PLT 99991111   Basic Metabolic Panel:  Recent Labs Lab 05/06/16 0001  NA 139  K 4.0  CL 100*  CO2 25  GLUCOSE 111*  BUN 21*  CREATININE 1.51*  CALCIUM 9.6   GFR: CrCl cannot be calculated (Unknown ideal  weight.).  Liver Function Tests: No results for input(s): AST, ALT, ALKPHOS, BILITOT, PROT, ALBUMIN in the last 168 hours. No results for input(s): LIPASE, AMYLASE in the last 168 hours. No results for input(s): AMMONIA in the last 168 hours. Coagulation Profile: No results for input(s): INR, PROTIME in the last 168 hours. Cardiac Enzymes: No results for input(s): CKTOTAL, CKMB, CKMBINDEX, TROPONINI in the last 168 hours. BNP (last 3 results) No results for input(s): PROBNP in the last 8760 hours. HbA1C: No results for input(s): HGBA1C in the last 72 hours. CBG: No results for input(s): GLUCAP in the last 168 hours. Lipid Profile: No results for input(s): CHOL, HDL, LDLCALC, TRIG, CHOLHDL, LDLDIRECT in the last 72 hours. Thyroid Function Tests: No results for input(s): TSH, T4TOTAL, FREET4, T3FREE, THYROIDAB in the last 72 hours. Anemia Panel: No results for input(s): VITAMINB12, FOLATE, FERRITIN, TIBC, IRON, RETICCTPCT in the last 72 hours. Sepsis Labs: Invalid input(s): PROCALCITONIN, LACTICIDVEN No results found for this or any previous visit (from the past 240 hour(s)).       Radiological Exams on Admission: Personally reviewed CXR clear: Dg Chest 2 View  Result Date: 05/06/2016 CLINICAL DATA:  Atrial fibrillation. Sensation of heart racing. Mild shortness of breath. EXAM: CHEST  2 VIEW COMPARISON:  11/15/2015 FINDINGS: Unchanged heart size and mediastinal contours, borderline cardiomegaly. Coronary stent is seen. No pulmonary edema, focal airspace disease, pleural effusion or pneumothorax. No acute osseous abnormalities are seen. IMPRESSION: Stable borderline cardiomegaly.  No acute abnormality. Electronically Signed   By: Jeb Levering M.D.   On: 05/06/2016 00:43    EKG: Independently reviewed. Rate 129, LBBB, tachycardia initially.  Repeat shows rate 118, wide complex irregular tachycardia.    Echocardiogram TEE 2013: EF 65% Severe  LVH HOCM    Assessment/Plan  1. Hypotension in setting of atrial fibrillation:  Baseline BP is in 100s, but his BP here has been substantially low, presumably from preload dependence in setting of rapid rates. NO evidence of sepsis.  BP improving now with improved HR.   CHADS2Vasc 2 for age HTN. -Check orthostatics  -Check mag, TSH -Consult to Cardiology, appreciate cares -Monitor on tele -Continue apixaban -Continue amiodarone -Will defer metoprolol until evaluated by Cardiology   2. Anemia:  Stable  3.  Acute kidney injury:  This is mild. -Fluids and repeat BMP tomorrow  4. Other medications:  -Continue statin, aspirin      DVT prophylaxis: N/A on apixaban  Code Status: FULL  Family Communication: None present  Disposition Plan: Anticipate continuing fluids today, check electrolytes.  Cardiology eval today.  If all testing normal and BP maintains normal and HR stays <100, d/c this evening or tomorrow. Consults called: Cardiology Admission status: OBS At the point of initial evaluation, it is my clinical opinion that admission for OBSERVATION is reasonable and necessary because the patient's presenting complaints in the context of their chronic conditions represent sufficient risk of deterioration or significant morbidity to constitute reasonable grounds for close observation in the hospital setting, but that the patient may be medically stable for discharge from the hospital within 24 to 48 hours.    Medical decision making: Patient seen at 7:00 AM on 05/06/2016.  The patient was discussed with Dr. Christy Gentles.  What exists of the patient's chart was reviewed in depth and summarized above.  Clinical condition: BP improving, mentation normal.        Edwin Dada [facility] Pager [phone]

## 2016-05-06 NOTE — ED Provider Notes (Signed)
[facility] DEPT Provider Note   CSN: [id number] Arrival date & time: 05/05/16  2341   By signing my name below, I, [name], attest that this documentation has been prepared under the direction and in the presence of [name], MD  Electronically Signed: [name], ED Scribe. 05/06/16. 1:40 AM.   History   Chief Complaint Chief Complaint  Patient presents with  . Palpitations   The history is provided by the patient. No language interpreter was used.  Palpitations   This is a new problem. The current episode started 6 to 12 hours ago. The problem has not changed since onset.The problem is associated with an unknown factor. Associated symptoms include irregular heartbeat and shortness of breath. Pertinent negatives include no fever, no numbness, no chest pain, no syncope, no abdominal pain, no nausea, no vomiting, no back pain and no lower extremity edema. He has tried nothing for the symptoms. His past medical history is significant for heart disease.   HPI Comments:  [name] is a 76 y.o. male, with a hx of HTN, CAD, MI and A-fib on amiodarone, who presents to the Emergency Department complaining of acute onset palpitations which began around 4 PM yesterday after eating. He also notes mild SOB. No alleviating factors noted. Pt denies fevers, vomiting, chest pain, back pain, abdominal pain, syncope, leg swelling, weakness/numbness in all extremities. Pt is on Eliquis and missed his dose tonight. He denies any problems with anesthesia. Pt is allergic to penicillin and Brilinta. No known food allergies noted.   Past Medical History:  Diagnosis Date  . Arthritis   . Chronic lower back pain    "last 3 months; usually when I bend" (12/24/2011)  . Coronary artery disease   . Endocarditis   . GERD (gastroesophageal reflux disease)   . Heart murmur   . High cholesterol   . Hypertension   . Migraines    "none in the last couple years" (12/24/2011)  . Myocardial  infarction    POSSIBLE 2012 ELEVATED ENZYMES  . Pneumonia ~ 2011    Patient Active Problem List   Diagnosis Date Noted  . SVT (supraventricular tachycardia) ([place]) 01/31/2015  . Colonic mass, right colon. 11/10/2011  . Acute bacterial endocarditis 11/06/2011  . HTN (hypertension) 11/06/2011  . BPH (benign prostatic hyperplasia) 11/06/2011  . 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 Back pain 11/06/2011  . Hyperlipidemia 11/06/2011  . Acute renal insufficiency 08/25/2010  . Viridans streptococci infection 08/25/2010  . Streptococcus infection 08/25/2010  . Tooth infection 08/25/2010  . Status post PICC central line placement 08/25/2010  . Arm pain, right 08/25/2010  . ENDOCARDITIS, BACTERIAL, SUBACUTE 05/07/2010  . ANEMIA, IRON DEFICIENCY 05/06/2010  . Hypertrophic obstructive cardiomyopathy ([place]) 05/06/2010  . SYNCOPE 05/06/2010  . DYSPNEA ON EXERTION 05/06/2010  . ECHOCARDIOGRAM, ABNORMAL 05/06/2010  . HEART MURMUR, HX OF 05/06/2010    Past Surgical History:  Procedure Laterality Date  . CARDIOVERSION N/A 10/29/2015   Procedure: CARDIOVERSION;  Surgeon: Adrian Prows, MD;  Location: [facility];  Service: Cardiovascular;  Laterality: N/A;  . COLON RESECTION  12/25/2011   Procedure: COLON RESECTION LAPAROSCOPIC;  Surgeon: Shann Medal, MD;  Location: [facility];  Service: General;  Laterality: Right;  laparoscopic assisted right colon resection  . COLONOSCOPY  11/11/2011   Procedure: COLONOSCOPY;  Surgeon: Missy Sabins, MD;  Location: [facility];  Service: Endoscopy;  Laterality: N/A;  . CORONARY ANGIOPLASTY WITH STENT PLACEMENT  11/11/2010   "1"  . CYSTECTOMY  ~ 1980  right eyelid  . TEE WITHOUT CARDIOVERSION  11/06/2011   Procedure: TRANSESOPHAGEAL ECHOCARDIOGRAM (TEE);  Surgeon: Laverda Page, MD;  Location: [facility];  Service: Cardiovascular;  Laterality: N/A;       Home Medications    Prior to Admission medications   Medication Sig Start Date End Date Taking? Authorizing  Provider  amiodarone (PACERONE) 200 MG tablet Take 1 tablet (200 mg total) by mouth daily. 02/05/16  Yes Evans Lance, MD  apixaban (ELIQUIS) 5 MG TABS tablet Take 5 mg by mouth 2 (two) times daily.   Yes Historical Provider, MD  Ascorbic Acid (VITAMIN C) 500 MG tablet Take 500 mg by mouth 2 (two) times daily.     Yes Historical Provider, MD  b complex vitamins tablet Take 1 tablet by mouth daily.   Yes Historical Provider, MD  Cholecalciferol (VITAMIN D3 PO) Take 1 capsule by mouth daily.   Yes Historical Provider, MD  Coenzyme Q10 (EQL COQ10) 300 MG CAPS Take 1 capsule by mouth every morning.    Yes Historical Provider, MD  glucosamine-chondroitin 500-400 MG tablet Take 1 tablet by mouth 2 (two) times daily.    Yes Historical Provider, MD  HAWTHORN BERRY PO Take 1 capsule by mouth every morning.    Yes Historical Provider, MD  Magnesium 250 MG TABS Take 250 mg by mouth 2 (two) times daily.    Yes Historical Provider, MD  metoprolol succinate (TOPROL-XL) 25 MG 24 hr tablet Take 12.5 mg by mouth daily.   Yes Historical Provider, MD  Multiple Vitamin (MULTIVITAMIN WITH MINERALS) TABS Take 1 tablet by mouth every morning.    Yes Historical Provider, MD  nitroGLYCERIN (NITROSTAT) 0.4 MG SL tablet Place 0.4 mg under the tongue every 5 (five) minutes as needed for chest pain.  11/06/15  Yes Historical Provider, MD  OVER THE COUNTER MEDICATION Take 1,000 mg by mouth every morning. BILLBERRY CAPSULE 1000MG    Yes Historical Provider, MD  Potassium (POTASSIMIN PO) Take 1 tablet by mouth daily.   Yes Historical Provider, MD  pravastatin (PRAVACHOL) 40 MG tablet Take 40 mg by mouth at bedtime.    Yes Historical Provider, MD  ranitidine (ZANTAC) 75 MG tablet Take 75 mg by mouth 2 (two) times daily.   Yes Historical Provider, MD  saw palmetto 160 MG capsule Take 160 mg by mouth 2 (two) times daily.    Yes Historical Provider, MD  vitamin E 400 UNIT capsule Take 400 Units by mouth daily.    Yes Historical  Provider, MD  VITAMIN K PO Take 1 capsule by mouth daily.   Yes Historical Provider, MD    Family History Family History  Problem Relation Age of Onset  . Hypertension Mother   . Cancer Neg Hx   . Diabetes Neg Hx   . Heart failure Neg Hx   . Hyperlipidemia Neg Hx   . Sudden death Neg Hx   . Stroke Neg Hx   . Heart attack Neg Hx     Social History Social History  Substance Use Topics  . Smoking status: Never Smoker  . Smokeless tobacco: Never Used     Comment: 12/24/2011 "used to puff cigarettes; never inhaled"  . Alcohol use No     Allergies   Brilinta [ticagrelor] and Penicillins   Review of Systems Review of Systems  Constitutional: Negative for fever.  Respiratory: Positive for shortness of breath.   Cardiovascular: Positive for palpitations. Negative for chest pain and syncope.  Gastrointestinal: Negative for  abdominal pain, nausea and vomiting.  Musculoskeletal: Negative for back pain.  Neurological: Negative for numbness.  All other systems reviewed and are negative.  Physical Exam Updated Vital Signs BP 100/83   Pulse (!) 133   Temp 98.3 F (36.8 C) (Oral)   Resp 17   SpO2 96%   Physical Exam CONSTITUTIONAL: Well developed/well nourished HEAD: Normocephalic/atraumatic EYES: EOMI/PERRL ENMT: Mucous membranes moist NECK: supple no meningeal signs SPINE/BACK:entire spine nontender CV: loud murmur noted, irregular  LUNGS: Lungs are clear to auscultation bilaterally, no apparent distress ABDOMEN: soft, nontender, no rebound or guarding, bowel sounds noted throughout abdomen GU:no cva tenderness NEURO: Pt is awake/alert/appropriate, moves all extremitiesx4.  No facial droop.   EXTREMITIES: pulses normal/equal, full ROM SKIN: warm, color normal PSYCH: no abnormalities of mood noted, alert and oriented to situation  ED Treatments / Results  DIAGNOSTIC STUDIES:  Oxygen Saturation is 96% on RA, normal by my interpretation.    COORDINATION OF  CARE:  1:39 AM Discussed treatment plan with pt at bedside and pt agreed to plan.  Labs (all labs ordered are listed, but only abnormal results are displayed) Labs Reviewed  BASIC METABOLIC PANEL - Abnormal; Notable for the following:       Result Value   Chloride 100 (*)    Glucose, Bld 111 (*)    BUN 21 (*)    Creatinine, Ser 1.51 (*)    GFR calc non Af Amer 44 (*)    GFR calc Af Amer 51 (*)    All other components within normal limits  CBC - Abnormal; Notable for the following:    RBC 3.76 (*)    Hemoglobin 11.5 (*)    HCT 34.5 (*)    RDW 15.9 (*)    All other components within normal limits  URINALYSIS, ROUTINE W REFLEX MICROSCOPIC    EKG  EKG Interpretation  Date/Time:  Tuesday May 05 2016 23:46:59 EST Ventricular Rate:  129 PR Interval:    QRS Duration: 164 QT Interval:  380 QTC Calculation: 556 R Axis:   -55 Text Interpretation:  Wide QRS tachycardia Left axis deviation Left bundle branch block Abnormal ECG Supraventricular tachycardia When compared with ECG of 11/15/2015, HEART RATE has increased Confirmed by [facility]  MD, DAVID ([id number]) on 05/06/2016 12:11:09 AM       EKG Interpretation  Date/Time:  Wednesday May 06 2016 01:35:18 EST Ventricular Rate:  118 PR Interval:    QRS Duration: 167 QT Interval:  410 QTC Calculation: 575 R Axis:   -103 Text Interpretation:  Sinus tachycardia IVCD, consider atypical LBBB Confirmed by Christy Gentles  MD, Aliyha Fornes ([id number]) on 05/06/2016 1:53:22 AM       Radiology Dg Chest 2 View  Result Date: 05/06/2016 CLINICAL DATA:  Atrial fibrillation. Sensation of heart racing. Mild shortness of breath. EXAM: CHEST  2 VIEW COMPARISON:  11/15/2015 FINDINGS: Unchanged heart size and mediastinal contours, borderline cardiomegaly. Coronary stent is seen. No pulmonary edema, focal airspace disease, pleural effusion or pneumothorax. No acute osseous abnormalities are seen. IMPRESSION: Stable borderline cardiomegaly.  No acute abnormality.  Electronically Signed   By: Jeb Levering M.D.   On: 05/06/2016 00:43    Procedures Procedures  CRITICAL CARE Performed by: Sharyon Cable Total critical care time: 31 minutes Critical care time was exclusive of separately billable procedures and treating other patients. Critical care was necessary to treat or prevent imminent or life-threatening deterioration. Critical care was time spent personally by me on the following activities: development of treatment  plan with patient and/or surrogate as well as nursing, discussions with consultants, evaluation of patient's response to treatment, examination of patient, obtaining history from patient or surrogate, ordering and performing treatments and interventions, ordering and review of laboratory studies, ordering and review of radiographic studies, pulse oximetry and re-evaluation of patient's condition. PATIENT WITH EPISODES OF HYPOTENSION (SBP 70S) THAT REQUIRED > 2 LITERS OF NORMAL SALINE ADMISSION   Medications Ordered in ED Medications  sodium chloride 0.9 % bolus 1,000 mL (0 mLs Intravenous Stopped 05/06/16 0322)  sodium chloride 0.9 % bolus 1,000 mL (0 mLs Intravenous Stopped 05/06/16 0459)  sodium chloride 0.9 % bolus 250 mL (250 mLs Intravenous New Bag/Given 05/06/16 0517)     Initial Impression / Assessment and Plan / ED Course  I have reviewed the triage vital signs and the nursing notes.  Pertinent labs & imaging results that were available during my care of the patient were reviewed by me and considered in my medical decision making (see chart for details).     6:10 AM PT IN THE ED FOR PALPITATIONS HE THOUGHT HE WAS BACK IN AFIB HOWEVER, HE APPEARS IN SINUS DURING HIS STAY, DOES NOT APPEAR CONSISTENTLY IN AFIB WHILE IN THE ROOM HE DOES HAVE EPISODES OF HYPOTENSION PARTICULARLY WHEN STANDING AND HE BECOMES LIGHTHEADED WHILE IN THE BED HE IS STABLE HE IS TAKING PO/WATCHING TV BUT HE IS STILL SYMPTOMATIC DESPITE FLUIDS I  ADVISED ADMISSION SUSPECT THIS MAY BE DUE TO MEDICATIONS (LOPRESSOR/AMIODARONE) HE IS NOT SEPTIC APPEARING HE IS NON TOXIC AT THIS TIME 6:44 AM Pt seen by dr danford with [facility] He will consult cardiology Pt currently stable at this time  Final Clinical Impressions(s) / ED Diagnoses   Final diagnoses:  Other specified hypotension  Dehydration    New Prescriptions New Prescriptions   No medications on file  I personally performed the services described in this documentation, which was scribed in my presence. The recorded information has been reviewed and is accurate.        [name], MD 05/06/16 [phone]

## 2016-05-06 NOTE — ED Notes (Signed)
Ordered breakfast tray  

## 2016-05-06 NOTE — Progress Notes (Signed)
CCMD advised pt is now in Sinus rhythm. EKG confirm sinus with 1st degree AV block. MD advised

## 2016-05-06 NOTE — ED Provider Notes (Signed)
ED ECG REPORT   Date: 05/06/2016 [id number]  Rate: 66  Rhythm: normal sinus rhythm  QRS Axis: left  Intervals: normal  ST/T Wave abnormalities: nonspecific ST changes  Conduction Disutrbances:left bundle branch block  Narrative Interpretation:   Old EKG Reviewed: unchanged  I have personally reviewed the EKG tracing and agree with the computerized printout as noted.    [name], MD 05/06/16 [phone]

## 2016-05-06 NOTE — Progress Notes (Signed)
Patient admitted after midnight, please see H&P.  BP responding minimally to IVF-- seen by cardiology Dr. Einar Gip.  Patient is symptomatic when he stands up.  Wife is in 4E at [facility].  He was here in the ER with her yesterday.   PT eval for tomm AM ordered  Eulogio Bear DO

## 2016-05-06 NOTE — ED Notes (Signed)
Pt given food and drink at this time.

## 2016-05-06 NOTE — Plan of Care (Signed)
Problem: Activity: Goal: Ability to tolerate increased activity will improve Outcome: Progressing IVF for hydration to improve BP and reduce dizziness.

## 2016-05-06 NOTE — Consult Note (Signed)
Cardiology Consult    [name] ID: [name] MRN: [id number], DOB/AGE: [birth year]   Admit date: 05/06/2016 Date of Consult: 05/06/2016  Primary Physician: [name], MD Reason for Consult: Afib, hypotension  Primary Cardiologist: Dr. [name] Requesting Provider: Dr. Loleta Books   [name] Profile    [name] is a 76 year old male with a past medical history of HTN, HLD, and PAF (on Eliquis), and CAD. He presented with palpitations and SOB.   History of Present Illness    [name] wife is admitted currently to [facility], he was visiting her yesterday when he developed palpitations and SOB. He denies chest pain. He was taken to the ED. His EKG showed Afib with LBBB with a rate of 129.   He was also hypotensive with BP's in the [id number] to [id number] systolic. He was given 2L of NS and currently his BP is 85/65. His normal BP is in the 110's/70's. He denies dizziness, lightheadedness. He denies chest pain. His rate is much better controlled now. HR is in the 80's. He was not given any medications other than the IV fluid.   He recently wore a holter monitor in Dec. 2017 that showed no atrial or ventricular tachycardia.   Past Medical History   Past Medical History:  Diagnosis Date  . Arthritis   . Chronic lower back pain    "last 3 months; usually when I bend" (12/24/2011)  . Coronary artery disease   . Endocarditis   . GERD (gastroesophageal reflux disease)   . Heart murmur   . High cholesterol   . Hypertension   . Migraines    "none in the last couple years" (12/24/2011)  . Myocardial infarction    POSSIBLE 2012 ELEVATED ENZYMES  . Pneumonia ~ 2011    Past Surgical History:  Procedure Laterality Date  . CARDIOVERSION N/A 10/29/2015   Procedure: CARDIOVERSION;  Surgeon: Adrian Prows, MD;  Location: [facility];  Service: Cardiovascular;  Laterality: N/A;  . COLON RESECTION  12/25/2011   Procedure: COLON RESECTION LAPAROSCOPIC;  Surgeon: Shann Medal, MD;  Location: [facility];  Service: General;  Laterality: Right;  laparoscopic assisted right colon resection  . COLONOSCOPY  11/11/2011   Procedure: COLONOSCOPY;  Surgeon: Missy Sabins, MD;  Location: [facility];  Service: Endoscopy;  Laterality: N/A;  . CORONARY ANGIOPLASTY WITH STENT PLACEMENT  11/11/2010   "1"  . CYSTECTOMY  ~ 1980   right eyelid  . TEE WITHOUT CARDIOVERSION  11/06/2011   Procedure: TRANSESOPHAGEAL ECHOCARDIOGRAM (TEE);  Surgeon: Laverda Page, MD;  Location: [facility];  Service: Cardiovascular;  Laterality: N/A;     Allergies  Allergies  Allergen Reactions  . Brilinta [Ticagrelor] Itching and Rash  . Penicillins Rash    Has [name] had a PCN reaction causing immediate rash, facial/tongue/throat swelling, SOB or lightheadedness with hypotension: Yes Has [name] had a PCN reaction causing severe rash involving mucus membranes or skin necrosis: Yes Has [name] had a PCN reaction that required hospitalization No Has [name] had a PCN reaction occurring within the last 10 years: No If all of the above answers are "NO", then may proceed with Cephalosporin use.     Inpatient Medications    . amiodarone  200 mg Oral Daily  . apixaban  5 mg Oral BID    Family History    Family History  Problem Relation Age of Onset  . Hypertension Mother   . Heart failure Father   . Cancer Neg  Hx   . Diabetes Neg Hx   . Hyperlipidemia Neg Hx   . Sudden death Neg Hx   . Stroke Neg Hx   . Heart attack Neg Hx     Social History    Social History   Social History  . Marital status: Married    Spouse name: N/A  . Number of children: N/A  . Years of education: N/A   Occupational History  . Not on file.   Social History Main Topics  . Smoking status: Never Smoker  . Smokeless tobacco: Never Used     Comment: 12/24/2011 "used to puff cigarettes; never inhaled"  . Alcohol use No  . Drug use: No  . Sexual activity: No   Other Topics Concern  . Not on file   Social History Narrative   . No narrative on file     Review of Systems    General:  No chills, fever, night sweats or weight changes.  Cardiovascular:  No chest pain, dyspnea on exertion, edema, orthopnea, palpitations, paroxysmal nocturnal dyspnea. Dermatological: No rash, lesions/masses Respiratory: No cough, dyspnea Urologic: No hematuria, dysuria Abdominal:   No nausea, vomiting, diarrhea, bright red blood per rectum, melena, or hematemesis Neurologic:  No visual changes, wkns, changes in mental status. All other systems reviewed and are otherwise negative except as noted above.  Physical Exam    Blood pressure (!) 85/65, pulse (!) 42, temperature 98.3 F (36.8 C), temperature source Oral, resp. rate 13, SpO2 97 %.  General: Pleasant, NAD Psych: Normal affect. Neuro: Alert and oriented X 3. Moves all extremities spontaneously. HEENT: Normal  Neck: Supple without bruits or JVD. Lungs:  Resp regular and unlabored, CTA. Heart: RRR no s3, s4, or murmurs. Abdomen: Soft, non-tender, non-distended, BS + x 4.  Extremities: No clubbing, cyanosis or edema. DP/PT/Radials 2+ and equal bilaterally.  Labs     Lab Results  Component Value Date   WBC 6.0 05/06/2016   HGB 11.5 (L) 05/06/2016   HCT 34.5 (L) 05/06/2016   MCV 91.8 05/06/2016   PLT 155 05/06/2016    Recent Labs Lab 05/06/16 0001  NA 139  K 4.0  CL 100*  CO2 25  BUN 21*  CREATININE 1.51*  CALCIUM 9.6  GLUCOSE 111*     Radiology Studies    Dg Chest 2 View  Result Date: 05/06/2016 CLINICAL DATA:  Atrial fibrillation. Sensation of heart racing. Mild shortness of breath. EXAM: CHEST  2 VIEW COMPARISON:  11/15/2015 FINDINGS: Unchanged heart size and mediastinal contours, borderline cardiomegaly. Coronary stent is seen. No pulmonary edema, focal airspace disease, pleural effusion or pneumothorax. No acute osseous abnormalities are seen. IMPRESSION: Stable borderline cardiomegaly.  No acute abnormality. Electronically Signed   By: Jeb Levering M.D.   On: 05/06/2016 00:43    EKG & Cardiac Imaging    EKG: LBBB, atrial flutter  Echocardiogram: pending.   Assessment & Plan    1. 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  Rate controlled now. On Amiodarone po at home, continue current dose.   William patients CHA2DS2-VASc Score and unadjusted Ischemic Stroke Rate (% per year) is equal to 2.2 % stroke rate/year from a score of 2 Above score calculated as 1 point each if present [CHF, HTN, DM, Vascular=MI/PAD/Aortic Plaque, Age if 65-74, or Male], 2 points each if present [Age > 75, or Stroke/TIA/TE]  Continue Eliquis for anticoagulation. Manika Hast consult EP for consideration of ablation.   2.  Hypotension: [name] is hypotensive after 2L bolus. MAP is 64. Asymptomatic.   3. History of CAD: s/p DES to proximal LAD in 2012.    Signed, Arbutus Leas, NP 05/06/2016, 8:13 AM Pager: [phone]  I have seen and examined [name] with Jettie Booze.  Agree with above, note added to reflect my findings.  On exam, irregular rhythm, no murmurs, lungs clear. Is in atrial bigeminy. It appears that his rhythm is sinus with bigeminy, but it could possibly be atrial flutter with variable block. He has a history of multiple atrial arrhythmias.  His HR is in the 80s, and his BP is in the 120s. Would restart his beta blocker tonight and see if that improves his ectopy. He has a history of multiple atrial arrhythmias and thus would prefer to avoid ablation at [facility] time.    Zanayah Shadowens M. Nikia Mangino MD 05/06/2016 4:25 PM

## 2016-05-06 NOTE — ED Notes (Signed)
EDP WIckline made aware of current blood pressures.  Advised another liter bolus.  Pt states does not feel symptomatic in any way except "winded" when walking to the bathroom earlier.

## 2016-05-06 NOTE — Consult Note (Signed)
CARDIOLOGY CONSULT NOTE  Patient ID: [name] MRN: [id number] DOB/AGE: [birth year] 74 y.o.  Admit date: 05/06/2016 Referring Physician  TRH, Dr. Eulogio Bear, DO Primary Physician:  [name], MD Reason for Consultation  Atrial arrhythmias and hypotension  HPI: [name]  is a 76 y.o. 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  Patient also has paroxysmal atrial flutter, he was evaluated by Dr. Crissie Sickles and was advised ablation. Due to social issues, wife being ill.  In the interim he had developed brief atrial fibrillation.  Hence Atrial Flutter ablation was considered to be Difficult to control due to complex arrhythmias, was recommended amiodarone.  He underwent cardio portion on 10/31/2015 and had done well until now when he was in the emergency room due to his wife being ill.  After he had lunch in the hospital, he developed rapid palpitations and hence he checked himself in through the emergency room for further evaluation.  He was found to be tachycardic, with a regular heart rhythm and hypotensive and I was asked to see the patient.  Otherwise he feels well.  Denies any chest pain or shortness of breath.  He has started to feel better since being in the hospital.  Past Medical History:  Diagnosis Date  . Arthritis   . Chronic lower back pain    "last 3 months; usually when I bend" (12/24/2011)  . Coronary artery disease   . Endocarditis   . GERD (gastroesophageal reflux disease)   . Heart murmur   . High cholesterol   . Hypertension   . Migraines    "none in the last couple years" (12/24/2011)  . Myocardial infarction    POSSIBLE 2012 ELEVATED ENZYMES  . Pneumonia ~ 2011     Past Surgical History:  Procedure Laterality Date  . CARDIOVERSION N/A 10/29/2015   Procedure: CARDIOVERSION;  Surgeon: Adrian Prows, MD;  Location: [place];  Service: Cardiovascular;  Laterality: N/A;  . COLON RESECTION  12/25/2011   Procedure: COLON RESECTION LAPAROSCOPIC;  Surgeon: Shann Medal, MD;  Location: [place];  Service: General;  Laterality: Right;  laparoscopic assisted right colon resection  . COLONOSCOPY  11/11/2011   Procedure: COLONOSCOPY;  Surgeon: Missy Sabins, MD;  Location: [place];  Service: Endoscopy;  Laterality: N/A;  . CORONARY ANGIOPLASTY WITH STENT PLACEMENT  11/11/2010   "1"  . CYSTECTOMY  ~ 1980   right eyelid  . TEE WITHOUT CARDIOVERSION  11/06/2011   Procedure: TRANSESOPHAGEAL ECHOCARDIOGRAM (TEE);  Surgeon: Laverda Page, MD;  Location: [facility] ENDOSCOPY;  Service: Cardiovascular;  Laterality: N/A;     Family History  Problem Relation Age of Onset  . Hypertension Mother   . Heart failure Father   . Cancer Neg Hx   . Diabetes Neg Hx   . Hyperlipidemia Neg Hx   . Sudden death Neg Hx   . Stroke Neg Hx   . Heart attack Neg Hx      Social History: Social History   Social History  . Marital status: Married    Spouse name: N/A  . Number of children: N/A  . Years of education: N/A   Occupational History  . Not on file.   Social History Main Topics  . Smoking status: Never Smoker  . Smokeless tobacco: Never Used  Comment: 12/24/2011 "used to puff cigarettes; never inhaled"  . Alcohol use No  . Drug use: No  . Sexual activity: No   Other Topics Concern  . Not on file   Social History Narrative  . No narrative on file     Prescriptions Prior to Admission  Medication Sig Dispense Refill Last Dose  . amiodarone (PACERONE) 200 MG tablet Take 1 tablet (200 mg total) by mouth daily. 180 tablet 3 05/05/2016 at Unknown time  . apixaban (ELIQUIS) 5 MG TABS tablet Take 5 mg by mouth 2 (two) times daily.   05/05/2016 at 1200  . Ascorbic Acid (VITAMIN C) 500 MG tablet Take 500 mg by mouth 2 (two) times daily.     05/05/2016 at Unknown  time  . b complex vitamins tablet Take 1 tablet by mouth daily.   05/05/2016 at Unknown time  . Cholecalciferol (VITAMIN D3 PO) Take 1 capsule by mouth daily.   05/05/2016 at Unknown time  . Coenzyme Q10 (EQL COQ10) 300 MG CAPS Take 1 capsule by mouth every morning.    05/05/2016 at Unknown time  . glucosamine-chondroitin 500-400 MG tablet Take 1 tablet by mouth 2 (two) times daily.    05/05/2016 at Unknown time  . HAWTHORN BERRY PO Take 1 capsule by mouth every morning.    05/05/2016 at Unknown time  . Magnesium 250 MG TABS Take 250 mg by mouth 2 (two) times daily.    05/05/2016 at Unknown time  . metoprolol succinate (TOPROL-XL) 25 MG 24 hr tablet Take 12.5 mg by mouth daily.   05/04/2016 at 2200  . Multiple Vitamin (MULTIVITAMIN WITH MINERALS) TABS Take 1 tablet by mouth every morning.    05/05/2016 at Unknown time  . nitroGLYCERIN (NITROSTAT) 0.4 MG SL tablet Place 0.4 mg under the tongue every 5 (five) minutes as needed for chest pain.    unknown  . OVER THE COUNTER MEDICATION Take 1,000 mg by mouth every morning. BILLBERRY CAPSULE 1000MG    05/05/2016 at Unknown time  . Potassium (POTASSIMIN PO) Take 1 tablet by mouth daily.   05/05/2016 at Unknown time  . pravastatin (PRAVACHOL) 40 MG tablet Take 40 mg by mouth at bedtime.    05/04/2016 at Unknown time  . ranitidine (ZANTAC) 75 MG tablet Take 75 mg by mouth 2 (two) times daily.   05/05/2016 at Unknown time  . saw palmetto 160 MG capsule Take 160 mg by mouth 2 (two) times daily.    05/05/2016 at Unknown time  . vitamin E 400 UNIT capsule Take 400 Units by mouth daily.    05/05/2016 at Unknown time  . VITAMIN K PO Take 1 capsule by mouth daily.   05/05/2016 at Unknown time     ROS: General: no fevers/chills/night sweats Eyes: no blurry vision, diplopia, or amaurosis ENT: no sore throat or hearing loss Resp: no cough, wheezing, or hemoptysis CV: palpitations present. no edemaor chest pain GI: no abdominal pain, nausea, vomiting, diarrhea, or  constipation GU: no dysuria, frequency, or hematuria Skin: no rash Neuro: no headache, numbness, tingling, or weakness of extremities Musculoskeletal: no joint pain or swelling Heme: no bleeding, DVT, or easy bruising Endo: no polydipsia or polyuria    Physical Exam: Blood pressure (!) 97/56, pulse 61, temperature 98.5 F (36.9 C), temperature source Oral, resp. rate 19, SpO2 97 %.   General appearance: alert, cooperative, appears stated age and no distress Lungs: clear to auscultation bilaterally Chest wall: no tenderness Heart: irregular rhythm, S1 is stable, S2  is normal, 3/6 crescendo decrescendo murmur heard in the left parasternal border  and apex.  No gallop. Extremities: extremities normal, atraumatic, no cyanosis or edema Pulses: 2+ and symmetric Neurologic: Grossly normal  Labs:   Lab Results  Component Value Date   WBC 6.0 05/06/2016   HGB 11.5 (L) 05/06/2016   HCT 34.5 (L) 05/06/2016   MCV 91.8 05/06/2016   PLT 155 05/06/2016    Recent Labs Lab 05/06/16 0001  NA 139  K 4.0  CL 100*  CO2 25  BUN 21*  CREATININE 1.51*  CALCIUM 9.6  GLUCOSE 111*    Lipid Panel     Component Value Date/Time   CHOL  04/15/2010 1342    136        ATP III CLASSIFICATION:  <200     mg/dL   Desirable  200-239  mg/dL   Borderline High  >=240    mg/dL   High          TRIG 44 04/15/2010 1342   HDL 37 (L) 04/15/2010 1342   CHOLHDL 3.7 04/15/2010 1342   VLDL 9 04/15/2010 1342   LDLCALC  04/15/2010 1342    90        Total Cholesterol/HDL:CHD Risk Coronary Heart Disease Risk Table                     Men   Women  1/2 Average Risk   3.4   3.3  Average Risk       5.0   4.4  2 X Average Risk   9.6   7.1  3 X Average Risk  23.4   11.0        Use the calculated Patient Ratio above and the CHD Risk Table to determine the patient's CHD Risk.        ATP III CLASSIFICATION (LDL):  <100     mg/dL   Optimal  100-129  mg/dL   Near or Above                    Optimal   130-159  mg/dL   Borderline  160-189  mg/dL   High  >190     mg/dL   Very High   Recent Labs  05/06/16 0835  TSH 2.572   EKG 05/06/2016: Probable slow atrial tachycardia with variable ventricular response at the rate of 87 bpm, interpolated PVCs the form of ventricular bigeminy, left bundle branch block.  No further analysis due to LBBB.  Out patient Echocardiogram 11/12/2015: 1. Left ventricle cavity is normal in size. Severe concentric hypertrophy of the left ventricle with LV wall measuring 2.0 cm. There is also focal basal septal hypertrophy with resultant severe LVOT obstruction. Peak PG across the LVOT 60 mm Hg (see AV findings). Normal global wall motion. Doppler evidence of grade III (restrictive) diastolic dysfunction. Diastolic dysfunction findings suggests elevated LA/LV end diastolic pressure. Calculated EF 74%. 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 2. Left atrial cavity is severely dilated at 5.7 cm. Right atrial cavity is moderately dilated. 3. Aortic valve is not well visualized. The non coronary cusp appears to have restricted motion. There is mild calcification noted. Mild aortic regurgitation. There is severe LVOT obstruction with resultant high gradients across the AV. Underlying at least mild to moderate AS cannot be excluded.   Aortic valve peak pressure gradient of  60  and mean gradient of  28  mmHg, calculated aortic valve area 0.89  cm.  4. Moderate (Grade II) mitral regurgitation. Trace mitral valve stenosis. E-wave dominant mitral inflow. 5. Moderate tricuspid regurgitation. Moderate to severe pulmonary hypertension. Pulmonary artery systolic pressure is estimated at 62 mm Hg. 6. Mild pulmonic regurgitation. 7. IVC is normal with blunted respiratory response. 8. Compared to 10/18/2014, Peak LVOT gradient of 112 mm Hg is now reduced to 60 mm Hg. Previously AV was well visualized and no stenosis.   Radiology: Dg Chest 2 View  Result  Date: 05/06/2016 CLINICAL DATA:  Atrial fibrillation. Sensation of heart racing. Mild shortness of breath. EXAM: CHEST  2 VIEW COMPARISON:  11/15/2015 FINDINGS: Unchanged heart size and mediastinal contours, borderline cardiomegaly. Coronary stent is seen. No pulmonary edema, focal airspace disease, pleural effusion or pneumothorax. No acute osseous abnormalities are seen. IMPRESSION: Stable borderline cardiomegaly.  No acute abnormality. Electronically Signed   By: Jeb Levering M.D.   On: 05/06/2016 00:43    Scheduled Meds: . amiodarone  200 mg Oral Daily  . apixaban  5 mg Oral BID  . famotidine  20 mg Oral BID  . magnesium gluconate  250 mg Oral Daily  . metoprolol succinate  25 mg Oral Daily   Continuous Infusions: . sodium chloride 100 mL/hr at 05/06/16 1758   PRN Meds:.  ASSESSMENT AND PLAN:  1.  Paroxysmal atrial fibrillation/atrial flutter, essentially controlled ventricular response. CHA2DS2-VASc Score is 2 with yearly risk of stroke of 2.2 %. 2. Coronary artery disease of the native vessel without angina pectoris, history of PTCA and stenting of the proximal and mid LAD on 11/11/2010 with 4.0 x 26 mm bare-metal stent. 3.  Subvalvular aortic stenosis with hokum physiology.  Recommendation: Extremely difficult situation, especially in view of severe LVOT obstruction with very severe pressure gradient across the aortic valve due to subaortic stenosis.  Any episodes of atrial arrhythmias with RVR leading to decompensation and low blood sugar.  I have been try to keep his heart rate low.  I'll obtain EP consultation to help need to see whether atrial flutter/atrial fibrillation ablation be more appropriate.  Presently with hydration with IV fluids and conservative management, he appears to be doing well.  We will at least observe him overnight prior to discharge.  Adrian Prows, MD 05/06/2016, 10:24 PM [facility]. [place] Pager: [phone] Office: [phone] If no answer  Cell [phone]

## 2016-05-07 ENCOUNTER — Inpatient Hospital Stay (HOSPITAL_COMMUNITY): Payer: Medicare Other

## 2016-05-07 DIAGNOSIS — I422 Other hypertrophic cardiomyopathy: Secondary | ICD-10-CM | POA: Diagnosis not present

## 2016-05-07 DIAGNOSIS — N179 Acute kidney failure, unspecified: Secondary | ICD-10-CM

## 2016-05-07 DIAGNOSIS — R002 Palpitations: Secondary | ICD-10-CM | POA: Diagnosis not present

## 2016-05-07 DIAGNOSIS — I421 Obstructive hypertrophic cardiomyopathy: Secondary | ICD-10-CM

## 2016-05-07 DIAGNOSIS — I10 Essential (primary) hypertension: Secondary | ICD-10-CM | POA: Diagnosis not present

## 2016-05-07 DIAGNOSIS — I480 Paroxysmal atrial fibrillation: Secondary | ICD-10-CM | POA: Diagnosis not present

## 2016-05-07 LAB — BASIC METABOLIC PANEL
ANION GAP: 5 (ref 5–15)
BUN: 19 mg/dL (ref 6–20)
CO2: 25 mmol/L (ref 22–32)
Calcium: 8.4 mg/dL — ABNORMAL LOW (ref 8.9–10.3)
Chloride: 111 mmol/L (ref 101–111)
Creatinine, Ser: 1.31 mg/dL — ABNORMAL HIGH (ref 0.61–1.24)
GFR calc Af Amer: >60 mL/min (ref >60)
GFR, EST NON AFRICAN AMERICAN: 52 mL/min — AB (ref >60)
Glucose, Bld: 99 mg/dL (ref 65–99)
Potassium: 4.3 mmol/L (ref 3.5–5.1)
SODIUM: 141 mmol/L (ref 135–145)

## 2016-05-07 LAB — CBC
HEMATOCRIT: 30.5 % — AB (ref 39.0–52.0)
HEMOGLOBIN: 9.8 g/dL — AB (ref 13.0–17.0)
MCH: 29.8 pg (ref 26.0–34.0)
MCHC: 32.1 g/dL (ref 30.0–36.0)
MCV: 92.7 fL (ref 78.0–100.0)
Platelets: 131 10*3/uL — ABNORMAL LOW (ref 150–400)
RBC: 3.29 MIL/uL — ABNORMAL LOW (ref 4.22–5.81)
RDW: 16.6 % — AB (ref 11.5–15.5)
WBC: 5.3 10*3/uL (ref 4.0–10.5)

## 2016-05-07 MED ORDER — METOPROLOL SUCCINATE ER 25 MG PO TB24
25.0000 mg | ORAL_TABLET | Freq: Every day | ORAL | 0 refills | Status: DC
Start: 2016-05-08 — End: 2016-09-02

## 2016-05-07 NOTE — Discharge Instructions (Signed)
Dehydration, Adult Dehydration is when there is not enough fluid or water in your body. This happens when you lose more fluids than you take in. Dehydration can range from mild to very bad. It should be treated right away to keep it from getting very bad. Symptoms of mild dehydration may include:   Thirst.  Dry lips.  Slightly dry mouth.  Dry, warm skin.  Dizziness. Symptoms of moderate dehydration may include:   Very dry mouth.  Muscle cramps.  Dark pee (urine). Pee may be the color of tea.  Your body making less pee.  Your eyes making fewer tears.  Heartbeat that is uneven or faster than normal (palpitations).  Headache.  Light-headedness, especially when you stand up from sitting.  Fainting (syncope). Symptoms of very bad dehydration may include:   Changes in skin, such as:  Cold and clammy skin.  Blotchy (mottled) or pale skin.  Skin that does not quickly return to normal after being lightly pinched and let go (poor skin turgor).  Changes in body fluids, such as:  Feeling very thirsty.  Your eyes making fewer tears.  Not sweating when body temperature is high, such as in hot weather.  Your body making very little pee.  Changes in vital signs, such as:  Weak pulse.  Pulse that is more than 100 beats a minute when you are sitting still.  Fast breathing.  Low blood pressure.  Other changes, such as:  Sunken eyes.  Cold hands and feet.  Confusion.  Lack of energy (lethargy).  Trouble waking up from sleep.  Short-term weight loss.  Unconsciousness. Follow these instructions at home:  If told by your doctor, drink an ORS:  Make an ORS by using instructions on the package.  Start by drinking small amounts, about  cup (120 mL) every 5-10 minutes.  Slowly drink more until you have had the amount that your doctor said to have.  Drink enough clear fluid to keep your pee clear or pale yellow. If you were told to drink an ORS, finish the  ORS first, then start slowly drinking clear fluids. Drink fluids such as:  Water. Do not drink only water by itself. Doing that can make the salt (sodium) level in your body get too low (hyponatremia).  Ice chips.  Fruit juice that you have added water to (diluted).  Low-calorie sports drinks.  Avoid:  Alcohol.  Drinks that have a lot of sugar. These include high-calorie sports drinks, fruit juice that does not have water added, and soda.  Caffeine.  Foods that are greasy or have a lot of fat or sugar.  Take over-the-counter and prescription medicines only as told by your doctor.  Do not take salt tablets. Doing that can make the salt level in your body get too high (hypernatremia).  Eat foods that have minerals (electrolytes). Examples include bananas, oranges, potatoes, tomatoes, and spinach.  Keep all follow-up visits as told by your doctor. This is important. Contact a doctor if:  You have belly (abdominal) pain that:  Gets worse.  Stays in one area (localizes).  You have a rash.  You have a stiff neck.  You get angry or annoyed more easily than normal (irritability).  You are more sleepy than normal.  You have a harder time waking up than normal.  You feel:  Weak.  Dizzy.  Very thirsty.  You have peed (urinated) only a small amount of very dark pee during 6-8 hours. Get help right away if:  You   have symptoms of very bad dehydration.  You cannot drink fluids without throwing up (vomiting).  Your symptoms get worse with treatment.  You have a fever.  You have a very bad headache.  You are throwing up or having watery poop (diarrhea) and it:  Gets worse.  Does not go away.  You have blood or something green (bile) in your throw-up.  You have blood in your poop (stool). This may cause poop to look black and tarry.  You have not peed in 6-8 hours.  You pass out (faint).  Your heart rate when you are sitting still is more than 100 beats a  minute.  You have trouble breathing. This information is not intended to replace advice given to you by your health care provider. Make sure you discuss any questions you have with your health care provider. Document Released: 01/17/2009 Document Revised: 10/11/2015 Document Reviewed: 05/17/2015 Elsevier Interactive Patient Education  2017 Elsevier Inc.  

## 2016-05-07 NOTE — Progress Notes (Signed)
SUBJECTIVE: The patient is doing well today.  At this time, he denies chest pain, shortness of breath, or any new concerns.  Marland Kitchen amiodarone  200 mg Oral Daily  . apixaban  5 mg Oral BID  . famotidine  20 mg Oral BID  . magnesium gluconate  250 mg Oral Daily  . metoprolol succinate  25 mg Oral Daily   . sodium chloride 100 mL/hr at 05/07/16 0425    OBJECTIVE: Physical Exam: Vitals:   05/06/16 2300 05/07/16 0621 05/07/16 0637 05/07/16 0903  BP:   114/67 113/72  Pulse:  63  73  Resp:  15    Temp:  98 F (36.7 C)    TempSrc:  Oral    SpO2:  98%    Weight: 185 lb (83.9 kg) 190 lb 9.6 oz (86.5 kg)    Height: 6' (1.829 m)       Intake/Output Summary (Last 24 hours) at 05/07/16 0909 Last data filed at 05/07/16 0400  Gross per 24 hour  Intake          2233.33 ml  Output                0 ml  Net          2233.33 ml    Telemetry is reviewed by myself with Dr. [name], is SR this morning, 1st degree AVBlock  GEN- The patient is well appearing, alert and oriented x 3 today.   Head- normocephalic, atraumatic Eyes-  Sclera clear, conjunctiva pink Ears- hearing intact Oropharynx- clear Neck- supple, no JVP Lungs- CTA b/l, normal work of breathing Heart- RRR, no significant murmurs, no rubs or gallops GI- soft, NT, ND Extremities- no clubbing, cyanosis, or edema Skin- no rash or lesion Psych- euthymic mood, full affect Neuro- no gross deficits appreciated  LABS: Basic Metabolic Panel:  Recent Labs  05/06/16 0001 05/06/16 0835 05/07/16 0322  NA 139  --  141  K 4.0  --  4.3  CL 100*  --  111  CO2 25  --  25  GLUCOSE 111*  --  99  BUN 21*  --  19  CREATININE 1.51*  --  1.31*  CALCIUM 9.6  --  8.4*  MG  --  2.3  --    CBC:  Recent Labs  05/06/16 0001 05/07/16 0322  WBC 6.0 5.3  HGB 11.5* 9.8*  HCT 34.5* 30.5*  MCV 91.8 92.7  PLT 155 131*   Cardiac Enzymes:  Recent Labs  05/06/16 0835  TROPONINI 0.05*   Thyroid Function Tests:  Recent Labs  05/06/16 0835  TSH 2.572     ASSESSMENT AND PLAN:   1. PAFlutter     CHA2DS2Vasc is at least 2, on Eliquis     SR this morning     BP stable  The patient is seen by Dr. [name] this morning, EKG's, telemetry reviewed     No changes, continue same amiodarone and metoprolol     Given multiple different atrial arrhythmias, and atriopathy, not felt to be an ablation candidate  2. HOCM     C/w Dr. Einar Gip  3. CAD     No c/o CP    EP service remains available, recall if needed, recommend close follow up with Dr. Einar Gip once discharged    Tommye Standard, PA-C 05/07/2016 9:09 AM  EP Attending  Patient seen and examined. Agree with the findings as noted above. He has reverted back to NSR despite his atriopathy.  He will continue amiodarone. No rec's for ablation at this point. I would not be inclined to recommend AV node ablation and PPM but this might be a consideration in the future.  Mikle Bosworth.D.

## 2016-05-07 NOTE — Care Management Note (Addendum)
Case Management Note  Patient Details  Name: [name] MRN: [id number] Date of Birth: March 23, 1941  Subjective/Objective:  Pt presented for palpitations. Pt had been visiting wife that is hospitalized and began having palpitations. Plan for pt to d/c home. No PT recommendations.                    Action/Plan: Referral received for [facility] needs: Pt feels that he will not need a HHRN. CM did make pt aware that if he feels he needs [place] upon return home to contact his PCP for orders. No further needs from CM at this time.   Expected Discharge Date:  05/07/16               Expected Discharge Plan:  Home/Self Care  In-House Referral:  NA  Discharge planning Services  CM Consult  Post Acute Care Choice:  NA Choice offered to:  NA  DME Arranged:  N/A DME Agency:  NA  HH Arranged:  NA HH Agency:  NA  Status of Service:  Completed, signed off  If discussed at [place] of Stay Meetings, dates discussed:    Additional Comments:  Bethena Roys, RN 05/07/2016, 10:24 AM

## 2016-05-07 NOTE — Progress Notes (Signed)
Subjective:  Doing well. States his rhythm normalized last night and he could feel it.  Objective:  Vital Signs in the last 24 hours: Temp:  [97.9 F (36.6 C)-98.5 F (36.9 C)] 98 F (36.7 C) (02/01 0621) Pulse Rate:  [33-81] 73 (02/01 0903) Resp:  [11-21] 15 (02/01 0621) BP: (80-125)/(53-72) 113/72 (02/01 0903) SpO2:  [91 %-98 %] 98 % (02/01 0621) Weight:  [185 lb (83.9 kg)-190 lb 9.6 oz (86.5 kg)] 190 lb 9.6 oz (86.5 kg) (02/01 0621)  Intake/Output from previous day: 01/31 0701 - 02/01 0700 In: 2483.3 [P.O.:240; I.V.:1993.3; IV Piggyback:250] Out: -   Physical Exam:  General appearance: alert, cooperative, appears stated age and no distress Lungs: clear to auscultation bilaterally Chest wall: no tenderness Heart: irregular rhythm, S1 S2 is normal, 3/6 crescendo decrescendo murmur heard in the left parasternal border  and apex.  No gallop. Extremities: extremities normal, atraumatic, no cyanosis or edema Pulses: 2+ and symmetric Neurologic: Grossly normal    Lab Results: BMP  Recent Labs  11/15/15 0455 05/06/16 0001 05/07/16 0322  NA 139 139 141  K 3.8 4.0 4.3  CL 104 100* 111  CO2 26 25 25   GLUCOSE 119* 111* 99  BUN 23* 21* 19  CREATININE 1.12 1.51* 1.31*  CALCIUM 8.9 9.6 8.4*  GFRNONAA >60 44* 52*  GFRAA >60 51* >60    CBC  Recent Labs Lab 05/07/16 0322  WBC 5.3  RBC 3.29*  HGB 9.8*  HCT 30.5*  PLT 131*  MCV 92.7  MCH 29.8  MCHC 32.1  RDW 16.6*    Recent Labs  10/30/15 2330 05/06/16 0835  TROPONINI 0.03* 0.05*     Recent Labs  05/06/16 0835  TSH 2.572    Imaging: Imaging results have been reviewed  Cardiac Studies: EKG 05/06/2016: Probable slow atrial tachycardia with variable ventricular response at the rate of 87 bpm, interpolated PVCs the form of ventricular bigeminy, left bundle branch block.  No further analysis due to LBBB.  Tele: Sinus with 1st degree. Wide complex rhythm.  Out patient Echocardiogram 11/12/2015: 1.  Left ventricle cavity is normal in size. Severe concentric hypertrophy of the left ventricle with LV wall measuring 2.0 cm. There is also focal basal septal hypertrophy with resultant severe LVOT obstruction. Peak PG across the LVOT 60 mm Hg (see AV findings). Normal global wall motion. Doppler evidence of grade III (restrictive) diastolic dysfunction. Diastolic dysfunction findings suggests elevated LA/LV end diastolic pressure. Calculated EF 74%. 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 2. Left atrial cavity is severely dilated at 5.7 cm. Right atrial cavity is moderately dilated. 3. Aortic valve is not well visualized. The non coronary cusp appears to have restricted motion. There is mild calcification noted. Mild aortic regurgitation. There is severe LVOT obstruction with resultant high gradients across the AV. Underlying at least mild to moderate AS cannot be excluded. Aortic valve peak pressure gradient of 60 and mean gradient of 28 mmHg, calculated aortic valve area 0.89 cm. 4. Moderate (Grade II) mitral regurgitation. Trace mitral valve stenosis. E-wave dominant mitral inflow. 5. Moderate tricuspid regurgitation. Moderate to severe pulmonary hypertension. Pulmonary artery systolic pressure is estimated at 62 mm Hg. 6. Mild pulmonic regurgitation. 7. IVC is normal with blunted respiratory response. 8. Compared to 10/18/2014, Peak LVOT gradient of 112 mm Hg is now reduced to 60 mm Hg. Previously AV was well visualized and no stenosis. Assessment/Plan:  1. Atrial fibrillation and flutter now back in sinus CHA2DS2-VASc Score is 2 with yearly  risk of stroke of 2.2 %. 2. HOCM  Rec: Stable from cardiac standpoint. Will discusswith EP whether AV nodal ablation and placement of pacemaker would be appropriate at this time. Can d/c home. Low BP due to HOCM and no need for change in present meds unless sumptomatic.   Adrian Prows, M.D. 05/07/2016, 9:22 AM [facility] Pager: [phone] Office: [phone] If no answer: [phone]

## 2016-05-07 NOTE — Discharge Summary (Signed)
Physician Discharge Summary  [name] [id number] DOB: [birth year] DOA: 05/06/2016  PCP: [name], MD  Admit date: 05/06/2016 Discharge date: 05/07/2016   Recommendations for Outpatient Follow-Up:   1. BMP 1-2 weeks re cr   Discharge Diagnosis:   Principal Problem:   Hypotension Active Problems:   Iron deficiency anemia   Hypertrophic obstructive cardiomyopathy (HCC)   Atrial fibrillation with RVR (HCC)   AKI (acute kidney injury) [facility])   Discharge disposition:  Home  Discharge Condition: Improved.  Diet recommendation: Low sodium, heart healthy  Wound care: None.   History of Present Illness:   [name] is a 76 y.o. male with a past medical history significant for HOCM, Afib/Aflutter/atrial tach on apixaban, CAD s/p PCI x1 who presents with palpitations  The patient was visiting his wife this afternoon in the rehab wing when he had onset of palpitations, SOB and vague chest discomfort.  This felt similar to previous episodes of Afib, so he came down to the ER.  There was no exertional chest pain, no severe SOB.  There was no fever, cough, sputum production, dysuria, hematuria.  No recent vomiting, diarrhea, decreased PO intake.   Hospital Course by Problem:   A fib/flutter -converted back to sinus -BB increased by EP -will be able to be d/c'd and will be seen outpatient for discussions of AV nodal ablation and placement of pacemaker  HOCM -per cards  AKI Improved with IVF -suspect due to not drinking much while in hospital with wife    Medical Consultants:    Cards  EP   Discharge Exam:   Vitals:   05/07/16 0637 05/07/16 0903  BP: 114/67 113/72  Pulse:  73  Resp:    Temp:     Vitals:   05/06/16 2300 05/07/16 0621 05/07/16 0637 05/07/16 0903  BP:   114/67 113/72  Pulse:  63  73  Resp:  15    Temp:  98 F (36.7 C)    TempSrc:  Oral    SpO2:  98%    Weight: 83.9 kg (185 lb) 86.5 kg (190 lb 9.6 oz)    Height: 6'  (1.829 m)       Gen:  NAD    The results of significant diagnostics from this hospitalization (including imaging, microbiology, ancillary and laboratory) are listed below for reference.     Procedures and Diagnostic Studies:   Dg Chest 2 View  Result Date: 05/06/2016 CLINICAL DATA:  Atrial fibrillation. Sensation of heart racing. Mild shortness of breath. EXAM: CHEST  2 VIEW COMPARISON:  11/15/2015 FINDINGS: Unchanged heart size and mediastinal contours, borderline cardiomegaly. Coronary stent is seen. No pulmonary edema, focal airspace disease, pleural effusion or pneumothorax. No acute osseous abnormalities are seen. IMPRESSION: Stable borderline cardiomegaly.  No acute abnormality. Electronically Signed   By: Jeb Levering M.D.   On: 05/06/2016 00:43     Labs:   Basic Metabolic Panel:  Recent Labs Lab 05/06/16 0001 05/06/16 0835 05/07/16 0322  NA 139  --  141  K 4.0  --  4.3  CL 100*  --  111  CO2 25  --  25  GLUCOSE 111*  --  99  BUN 21*  --  19  CREATININE 1.51*  --  1.31*  CALCIUM 9.6  --  8.4*  MG  --  2.3  --    GFR Estimated Creatinine Clearance: 55.1 mL/min (by C-G formula based on SCr of 1.31 mg/dL (H)). Liver Function Tests:  No results for input(s): AST, ALT, ALKPHOS, BILITOT, PROT, ALBUMIN in the last 168 hours. No results for input(s): LIPASE, AMYLASE in the last 168 hours. No results for input(s): AMMONIA in the last 168 hours. Coagulation profile No results for input(s): INR, PROTIME in the last 168 hours.  CBC:  Recent Labs Lab 05/06/16 0001 05/07/16 0322  WBC 6.0 5.3  HGB 11.5* 9.8*  HCT 34.5* 30.5*  MCV 91.8 92.7  PLT 155 131*   Cardiac Enzymes:  Recent Labs Lab 05/06/16 0835  TROPONINI 0.05*   BNP: Invalid input(s): POCBNP CBG: No results for input(s): GLUCAP in the last 168 hours. D-Dimer No results for input(s): DDIMER in the last 72 hours. Hgb A1c No results for input(s): HGBA1C in the last 72 hours. Lipid Profile No  results for input(s): CHOL, HDL, LDLCALC, TRIG, CHOLHDL, LDLDIRECT in the last 72 hours. Thyroid function studies  Recent Labs  05/06/16 0835  TSH 2.572   Anemia work up No results for input(s): VITAMINB12, FOLATE, FERRITIN, TIBC, IRON, RETICCTPCT in the last 72 hours. Microbiology No results found for this or any previous visit (from the past 240 hour(s)).   Discharge Instructions:   Discharge Instructions    Diet - low sodium heart healthy    Complete by:  As directed    Increase activity slowly    Complete by:  As directed      Allergies as of 05/07/2016      Reactions   Brilinta [ticagrelor] Itching, Rash   Penicillins Rash   Has patient had a PCN reaction causing immediate rash, facial/tongue/throat swelling, SOB or lightheadedness with hypotension: Yes Has patient had a PCN reaction causing severe rash involving mucus membranes or skin necrosis: Yes Has patient had a PCN reaction that required hospitalization No Has patient had a PCN reaction occurring within the last 10 years: No If all of the above answers are "NO", then may proceed with Cephalosporin use.      Medication List    STOP taking these medications   nitroGLYCERIN 0.4 MG SL tablet Commonly known as:  NITROSTAT     TAKE these medications   amiodarone 200 MG tablet Commonly known as:  PACERONE Take 1 tablet (200 mg total) by mouth daily.   apixaban 5 MG Tabs tablet Commonly known as:  ELIQUIS Take 5 mg by mouth 2 (two) times daily.   b complex vitamins tablet Take 1 tablet by mouth daily.   EQL COQ10 300 MG Caps Generic drug:  Coenzyme Q10 Take 1 capsule by mouth every morning.   glucosamine-chondroitin 500-400 MG tablet Take 1 tablet by mouth 2 (two) times daily.   HAWTHORN BERRY PO Take 1 capsule by mouth every morning.   Magnesium 250 MG Tabs Take 250 mg by mouth 2 (two) times daily.   metoprolol succinate 25 MG 24 hr tablet Commonly known as:  TOPROL-XL Take 1 tablet (25 mg total)  by mouth daily. Start taking on:  05/08/2016 What changed:  how much to take   multivitamin with minerals Tabs tablet Take 1 tablet by mouth every morning.   OVER THE COUNTER MEDICATION Take 1,000 mg by mouth every morning. BILLBERRY CAPSULE 1000MG    POTASSIMIN PO Take 1 tablet by mouth daily.   pravastatin 40 MG tablet Commonly known as:  PRAVACHOL Take 40 mg by mouth at bedtime.   ranitidine 75 MG tablet Commonly known as:  ZANTAC Take 75 mg by mouth 2 (two) times daily.   saw palmetto 160 MG  capsule Take 160 mg by mouth 2 (two) times daily.   vitamin C 500 MG tablet Commonly known as:  ASCORBIC ACID Take 500 mg by mouth 2 (two) times daily.   VITAMIN D3 PO Take 1 capsule by mouth daily.   vitamin E 400 UNIT capsule Take 400 Units by mouth daily.   VITAMIN K PO Take 1 capsule by mouth daily.      Follow-up Information    [name], MD Follow up in 1 week(s).   Specialty:  Internal Medicine Contact information: [place] [address] [phone]        Adrian Prows, MD Follow up.   Specialty:  Cardiology Contact information: [address] [place] [address] [phone]            Time coordinating discharge: 35 min  Signed:  [place]   [facility] 05/07/2016, 11:38 AM

## 2016-05-07 NOTE — Evaluation (Addendum)
Physical Therapy Evaluation Patient Details Name: [name] MRN: [id number] DOB: 08-07-1940 Today's Date: 05/07/2016   History of Present Illness  Patient is a 76 y/o male with hx of MI, HTN, high cholesterol, endocarditis, Afib/Aflutter/atrial tach on apixaban, CAD s/p PCI x1 presents with palpitations, SOB and CP. EK showed Afib with LBBB with a rate of 129.   Clinical Impression  Patient presents with mild weakness from being stuck in the bed and drop in BP with changes in position. Pt tolerated gait training with supervision progressing to Mod I. No overt LOB or dizziness during mobility. Orthostatic vitals below. Supine BP 115/68 Sitting BP 101/63  HR 65 bpm Standing BP 97/63  HR 68 bpm Sitting post ambulation BP 129/73 HR 83 bpm RR increased to high 40s during ambulation. Discussed importance of waiting after changing positions at home due to drop in BP when standing and signs/symptoms to look for. Encouraged daily mobility while in the hospital. 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 Pt does not required skilled therapy services. Discharge from therapy.     Follow Up Recommendations No PT follow up;Supervision - Intermittent    Equipment Recommendations  None recommended by PT    Recommendations for Other Services       Precautions / Restrictions Precautions Precautions: None Restrictions Weight Bearing Restrictions: No      Mobility  Bed Mobility Overal bed mobility: Modified Independent                Transfers Overall transfer level: Modified independent               General transfer comment: Stood from EOB x1, transferred to chair post ambulation. No dizziness.  Ambulation/Gait Ambulation/Gait assistance: Supervision;Modified independent (Device/Increase time) Ambulation Distance (Feet): 250 Feet Assistive device: None Gait Pattern/deviations: Step-through pattern;Decreased stride length;Drifts right/left     General  Gait Details: Mostly steady gait. RR up to 48. No dizziness or overt LOB.  Stairs            Wheelchair Mobility    Modified Rankin (Stroke Patients Only)       Balance Overall balance assessment: Needs assistance Sitting-balance support: Feet supported;No upper extremity supported Sitting balance-Leahy Scale: Good     Standing balance support: During functional activity Standing balance-Leahy Scale: Good Standing balance comment: Able to stand statically without support.                             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     Hand Dominance   Dominant Hand: Right    Extremity/Trunk Assessment   Upper Extremity Assessment Upper Extremity Assessment: Defer to OT evaluation    Lower Extremity Assessment Lower Extremity Assessment: Generalized weakness (but functional)       Communication   Communication: No difficulties  Cognition Arousal/Alertness: Awake/alert Behavior During Therapy: WFL for tasks assessed/performed Overall Cognitive Status: Within Functional Limits for tasks assessed  General Comments      Exercises     Assessment/Plan    PT Assessment Patent does not need any further PT services  PT Problem List            PT Treatment Interventions      PT Goals (Current goals can be found in the Care Plan section)  Acute Rehab PT Goals Patient Stated Goal: to check  on my wife (who is in the hospital) and then go home PT Goal Formulation: With patient Time For Goal Achievement: 05/21/16 Potential to Achieve Goals: Good    Frequency     Barriers to discharge        Co-evaluation               End of Session Equipment Utilized During Treatment: Gait belt Activity Tolerance: Patient tolerated treatment well Patient left: in chair;with call bell/phone within reach Nurse Communication: Mobility status         Time: 0752-0805 PT Time Calculation (min) (ACUTE ONLY): 13 min   Charges:   PT Evaluation $PT Eval Low Complexity: 1 Procedure     PT G Codes:        Ed [name] A [name] 05/07/2016, 8:11 AM [name], PT, DPT [phone]

## 2016-05-18 DIAGNOSIS — E785 Hyperlipidemia, unspecified: Secondary | ICD-10-CM | POA: Diagnosis not present

## 2016-05-18 DIAGNOSIS — J309 Allergic rhinitis, unspecified: Secondary | ICD-10-CM | POA: Diagnosis not present

## 2016-05-18 DIAGNOSIS — K219 Gastro-esophageal reflux disease without esophagitis: Secondary | ICD-10-CM | POA: Diagnosis not present

## 2016-05-18 DIAGNOSIS — Z09 Encounter for follow-up examination after completed treatment for conditions other than malignant neoplasm: Secondary | ICD-10-CM | POA: Diagnosis not present

## 2016-05-25 ENCOUNTER — Ambulatory Visit (HOSPITAL_COMMUNITY)
Admission: EM | Admit: 2016-05-25 | Discharge: 2016-05-25 | Disposition: A | Discharge status: Home / Self Care | Payer: Medicare Other | Attending: Internal Medicine | Admitting: Internal Medicine

## 2016-05-25 ENCOUNTER — Encounter (HOSPITAL_COMMUNITY): Payer: Self-pay | Admitting: Emergency Medicine

## 2016-05-25 DIAGNOSIS — N489 Disorder of penis, unspecified: Secondary | ICD-10-CM

## 2016-05-25 DIAGNOSIS — B3749 Other urogenital candidiasis: Secondary | ICD-10-CM | POA: Diagnosis not present

## 2016-05-25 MED ORDER — CLOTRIMAZOLE 1 % EX CREA: TOPICAL_CREAM | CUTANEOUS | 0 refills | Status: DC

## 2016-05-25 MED ORDER — CLINDAMYCIN HCL 150 MG PO CAPS: 150.0000 mg | ORAL_CAPSULE | Freq: Four times a day (QID) | ORAL | 0 refills | Status: DC

## 2016-05-25 NOTE — Discharge Instructions (Addendum)
Followup with dermatologist or urologist for biopsy of lesion on penis.   For red patches, prescription for lotrimin cream (treats yeast) and for clindamycin (oral antibiotic) sent to the [facility] on [address].  Recheck or followup with primary care provider if rash is not starting to improve in a few days.

## 2016-05-25 NOTE — ED Provider Notes (Signed)
[place]    CSN: [id number] Arrival date & time: 05/25/16  1252     History   Chief Complaint Chief Complaint  Patient presents with  . Rash    HPI [name] is a 76 y.o. male. He presents today with a couple days history of red patches on his penis, little bit uncomfortable but not acutely so. He had this a year or 2 ago, and was treated with antibiotics and antibiotic ointment, with improvement. He also has a "skin tag" on the end of his penis, that he is a little concerned about. This has been there for about a year, and is slowly growing. No urinary symptoms. No scrotal pain or swelling. Feels fine otherwise. On eliquis for atrial fibrillation.    HPI  Past Medical History:  Diagnosis Date  . Arthritis   . Chronic lower back pain    "last 3 months; usually when I bend" (12/24/2011)  . Coronary artery disease   . Endocarditis   . GERD (gastroesophageal reflux disease)   . Heart murmur   . High cholesterol   . Hypertension   . Migraines    "none in the last couple years" (12/24/2011)  . Myocardial infarction    POSSIBLE 2012 ELEVATED ENZYMES  . Pneumonia ~ 2011    Patient Active Problem List   Diagnosis Date Noted  . Hypotension 05/06/2016  . Atrial fibrillation with RVR ([place]) 05/06/2016  . AKI (acute kidney injury) ([place]) 05/06/2016  . SVT (supraventricular tachycardia) ([place]) 01/31/2015  . Colonic mass, right colon. 11/10/2011  . Acute bacterial endocarditis 11/06/2011  . HTN (hypertension) 11/06/2011  . BPH (benign prostatic hyperplasia) 11/06/2011  . 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 Back pain 11/06/2011  . Hyperlipidemia 11/06/2011  . Acute renal insufficiency 08/25/2010  . Viridans streptococci infection 08/25/2010  . Streptococcus infection 08/25/2010  . Tooth infection 08/25/2010  . Status post PICC central line placement 08/25/2010  . Arm pain, right 08/25/2010  . ENDOCARDITIS, BACTERIAL, SUBACUTE 05/07/2010  . Iron  deficiency anemia 05/06/2010  . Hypertrophic obstructive cardiomyopathy ([place]) 05/06/2010  . SYNCOPE 05/06/2010  . DYSPNEA ON EXERTION 05/06/2010  . ECHOCARDIOGRAM, ABNORMAL 05/06/2010  . HEART MURMUR, HX OF 05/06/2010    Past Surgical History:  Procedure Laterality Date  . CARDIOVERSION N/A 10/29/2015   Procedure: CARDIOVERSION;  Surgeon: Adrian Prows, MD;  Location: [place];  Service: Cardiovascular;  Laterality: N/A;  . COLON RESECTION  12/25/2011   Procedure: COLON RESECTION LAPAROSCOPIC;  Surgeon: Shann Medal, MD;  Location: [place];  Service: General;  Laterality: Right;  laparoscopic assisted right colon resection  . COLONOSCOPY  11/11/2011   Procedure: COLONOSCOPY;  Surgeon: Missy Sabins, MD;  Location: [place];  Service: Endoscopy;  Laterality: N/A;  . CORONARY ANGIOPLASTY WITH STENT PLACEMENT  11/11/2010   "1"  . CYSTECTOMY  ~ 1980   right eyelid  . TEE WITHOUT CARDIOVERSION  11/06/2011   Procedure: TRANSESOPHAGEAL ECHOCARDIOGRAM (TEE);  Surgeon: Laverda Page, MD;  Location: [facility];  Service: Cardiovascular;  Laterality: N/A;       Home Medications    Prior to Admission medications   Medication Sig Start Date End Date Taking? Authorizing Provider  amiodarone (PACERONE) 200 MG tablet Take 1 tablet (200 mg total) by mouth daily. 02/05/16   Evans Lance, MD  apixaban (ELIQUIS) 5 MG TABS tablet Take 5 mg by mouth 2 (two) times daily.    Historical Provider, MD  Ascorbic Acid (VITAMIN C) 500  MG tablet Take 500 mg by mouth 2 (two) times daily.      Historical Provider, MD  b complex vitamins tablet Take 1 tablet by mouth daily.    Historical Provider, MD  Cholecalciferol (VITAMIN D3 PO) Take 1 capsule by mouth daily.    Historical Provider, MD  clindamycin (CLEOCIN) 150 MG capsule Take 1 capsule (150 mg total) by mouth every 6 (six) hours. 05/25/16   Sherlene Shams, MD  clotrimazole (LOTRIMIN) 1 % cream Apply to affected area 2 times daily 05/25/16   Sherlene Shams,  MD  Coenzyme Q10 (EQL COQ10) 300 MG CAPS Take 1 capsule by mouth every morning.     Historical Provider, MD  glucosamine-chondroitin 500-400 MG tablet Take 1 tablet by mouth 2 (two) times daily.     Historical Provider, MD  HAWTHORN BERRY PO Take 1 capsule by mouth every morning.     Historical Provider, MD  Magnesium 250 MG TABS Take 250 mg by mouth 2 (two) times daily.     Historical Provider, MD  metoprolol succinate (TOPROL-XL) 25 MG 24 hr tablet Take 1 tablet (25 mg total) by mouth daily. 05/08/16   Geradine Girt, DO  Multiple Vitamin (MULTIVITAMIN WITH MINERALS) TABS Take 1 tablet by mouth every morning.     Historical Provider, MD  OVER THE COUNTER MEDICATION Take 1,000 mg by mouth every morning. BILLBERRY CAPSULE 1000MG     Historical Provider, MD  Potassium (POTASSIMIN PO) Take 1 tablet by mouth daily.    Historical Provider, MD  pravastatin (PRAVACHOL) 40 MG tablet Take 40 mg by mouth at bedtime.     Historical Provider, MD  ranitidine (ZANTAC) 75 MG tablet Take 75 mg by mouth 2 (two) times daily.    Historical Provider, MD  saw palmetto 160 MG capsule Take 160 mg by mouth 2 (two) times daily.     Historical Provider, MD  vitamin E 400 UNIT capsule Take 400 Units by mouth daily.     Historical Provider, MD  VITAMIN K PO Take 1 capsule by mouth daily.    Historical Provider, MD    Family History Family History  Problem Relation Age of Onset  . Hypertension Mother   . Heart failure Father   . Cancer Neg Hx   . Diabetes Neg Hx   . Hyperlipidemia Neg Hx   . Sudden death Neg Hx   . Stroke Neg Hx   . Heart attack Neg Hx     Social History Social History  Substance Use Topics  . Smoking status: Never Smoker  . Smokeless tobacco: Never Used     Comment: 12/24/2011 "used to puff cigarettes; never inhaled"  . Alcohol use No     Allergies   Brilinta [ticagrelor] and Penicillins   Review of Systems Review of Systems  All other systems reviewed and are negative.    Physical  Exam Triage Vital Signs ED Triage Vitals  Enc Vitals Group     BP 05/25/16 1334 103/58     Pulse Rate 05/25/16 1334 64     Resp 05/25/16 1334 16     Temp 05/25/16 1334 98.2 F (36.8 C)     Temp Source 05/25/16 1334 Oral     SpO2 05/25/16 1334 98 %     Weight --      Height --      Pain Score 05/25/16 1333 0   Updated Vital Signs BP 103/58 (BP Location: Right Arm)   Pulse 64  Temp 98.2 F (36.8 C) (Oral)   Resp 16   SpO2 98%   Physical Exam  Constitutional: He is oriented to person, place, and time. No distress.  Alert, nicely groomed  HENT:  Head: Atraumatic.  Eyes:  Conjugate gaze, no eye redness/drainage  Neck: Neck supple.  Cardiovascular: Normal rate.   Pulmonary/Chest: No respiratory distress.  Abdominal: He exhibits no distension.  Genitourinary:  Genitourinary Comments: Uncircumcised, several 3 mm superficial erosions/red patches present.  Mildly tender At the posterior aspect of the urethra, there is a 1 cm irregular lesion, suggestive of a wart, but firm to palpation, and a satellite patch nearby.  Musculoskeletal: Normal range of motion.  Neurological: He is alert and oriented to person, place, and time.  Skin: Skin is warm and dry.  No cyanosis  Nursing note and vitals reviewed.    UC Treatments / Results   Procedures Procedures (including critical care time) None today  Final Clinical Impressions(s) / UC Diagnoses   Final diagnoses:  Candidal balanoposthitis  Penile lesion   Followup with dermatologist or urologist for biopsy of lesion on penis.   For red patches, prescription for lotrimin cream (treats yeast) and for clindamycin (oral antibiotic[facility] [address]  Recheck or followup with primary care provider if rash is not starting to improve in a few days.    New Prescriptions Discharge Medication List as of 05/25/2016  3:03 PM    START taking these medications   Details  clindamycin (CLEOCIN) 150 MG capsule Take 1 capsule  (150 mg total) by mouth every 6 (six) hours., Starting Mon 05/25/2016, Normal    clotrimazole (LOTRIMIN) 1 % cream Apply to affected area 2 times daily, Normal         Sherlene Shams, MD 05/26/16 1547

## 2016-05-25 NOTE — ED Triage Notes (Signed)
The patient presented to the [facility] with a complaint of a rash on his penis x 2 days.

## 2016-05-29 DIAGNOSIS — I422 Other hypertrophic cardiomyopathy: Secondary | ICD-10-CM | POA: Diagnosis not present

## 2016-05-29 DIAGNOSIS — I480 Paroxysmal atrial fibrillation: Secondary | ICD-10-CM | POA: Diagnosis not present

## 2016-05-29 DIAGNOSIS — Q244 Congenital subaortic stenosis: Secondary | ICD-10-CM | POA: Diagnosis not present

## 2016-05-29 DIAGNOSIS — I2510 Atherosclerotic heart disease of native coronary artery without angina pectoris: Secondary | ICD-10-CM | POA: Diagnosis not present

## 2016-06-01 DIAGNOSIS — E785 Hyperlipidemia, unspecified: Secondary | ICD-10-CM | POA: Diagnosis not present

## 2016-06-01 DIAGNOSIS — D649 Anemia, unspecified: Secondary | ICD-10-CM | POA: Diagnosis not present

## 2016-06-01 DIAGNOSIS — I2510 Atherosclerotic heart disease of native coronary artery without angina pectoris: Secondary | ICD-10-CM | POA: Diagnosis not present

## 2016-06-09 ENCOUNTER — Telehealth: Payer: Self-pay | Admitting: Internal Medicine

## 2016-06-09 NOTE — Telephone Encounter (Signed)
New Message    Pt is having some episodes of palpitations , what changes should he be making?  Patient c/o Palpitations:  High priority if patient c/o lightheadedness and shortness of breath.  1. How long have you been having palpitations?  Saturday to Sunday and again today  2. Are you currently experiencing lightheadedness and shortness of breath? A little SOB  3. Have you checked your BP and heart rate? (document readings) no  4. Are you experiencing any other symptoms? No  Did not want triage, wants to speak with Renae Oxygen level is 98

## 2016-06-09 NOTE — Telephone Encounter (Signed)
Called, spoke with pt. Pt informed having palpitations. Forwarded to Dr. [name] to advise. Dr. [name] recommended pt continue with current medications and schedule f/u appt. Informed to be sure to take medication as prescribed, not to miss doses and take at the scheduled time (not to be late with doses). Informed someone will contact pt to schedule f/u appt with Dr. [name].

## 2016-06-15 DIAGNOSIS — Z79899 Other long term (current) drug therapy: Secondary | ICD-10-CM | POA: Diagnosis not present

## 2016-06-15 DIAGNOSIS — I429 Cardiomyopathy, unspecified: Secondary | ICD-10-CM | POA: Diagnosis not present

## 2016-06-15 DIAGNOSIS — D649 Anemia, unspecified: Secondary | ICD-10-CM | POA: Diagnosis not present

## 2016-06-15 DIAGNOSIS — E785 Hyperlipidemia, unspecified: Secondary | ICD-10-CM | POA: Diagnosis not present

## 2016-06-15 DIAGNOSIS — D509 Iron deficiency anemia, unspecified: Secondary | ICD-10-CM | POA: Diagnosis not present

## 2016-06-15 DIAGNOSIS — I10 Essential (primary) hypertension: Secondary | ICD-10-CM | POA: Diagnosis not present

## 2016-06-15 DIAGNOSIS — K219 Gastro-esophageal reflux disease without esophagitis: Secondary | ICD-10-CM | POA: Diagnosis not present

## 2016-06-15 DIAGNOSIS — Z125 Encounter for screening for malignant neoplasm of prostate: Secondary | ICD-10-CM | POA: Diagnosis not present

## 2016-06-15 DIAGNOSIS — I2510 Atherosclerotic heart disease of native coronary artery without angina pectoris: Secondary | ICD-10-CM | POA: Diagnosis not present

## 2016-06-22 DIAGNOSIS — I2510 Atherosclerotic heart disease of native coronary artery without angina pectoris: Secondary | ICD-10-CM | POA: Diagnosis not present

## 2016-06-22 DIAGNOSIS — I429 Cardiomyopathy, unspecified: Secondary | ICD-10-CM | POA: Diagnosis not present

## 2016-06-22 DIAGNOSIS — I10 Essential (primary) hypertension: Secondary | ICD-10-CM | POA: Diagnosis not present

## 2016-06-22 DIAGNOSIS — E785 Hyperlipidemia, unspecified: Secondary | ICD-10-CM | POA: Diagnosis not present

## 2016-06-26 ENCOUNTER — Emergency Department (INDEPENDENT_AMBULATORY_CARE_PROVIDER_SITE_OTHER)
Admission: EM | Admit: 2016-06-26 | Discharge: 2016-06-26 | Disposition: A | Discharge status: Home / Self Care | Payer: Medicare Other | Source: Home / Self Care

## 2016-06-26 ENCOUNTER — Encounter: Payer: Self-pay | Admitting: Emergency Medicine

## 2016-06-26 DIAGNOSIS — R05 Cough: Secondary | ICD-10-CM | POA: Diagnosis not present

## 2016-06-26 DIAGNOSIS — J40 Bronchitis, not specified as acute or chronic: Secondary | ICD-10-CM

## 2016-06-26 DIAGNOSIS — J309 Allergic rhinitis, unspecified: Secondary | ICD-10-CM

## 2016-06-26 DIAGNOSIS — R058 Other specified cough: Secondary | ICD-10-CM

## 2016-06-26 MED ORDER — PREDNISONE 20 MG PO TABS: 20.0000 mg | ORAL_TABLET | Freq: Two times a day (BID) | ORAL | 0 refills | Status: DC

## 2016-06-26 MED ORDER — AZITHROMYCIN 250 MG PO TABS: ORAL_TABLET | ORAL | 0 refills | Status: DC

## 2016-06-26 NOTE — ED Provider Notes (Signed)
[name]    CSN: [id number] Arrival date & time: 06/26/16  1411  Here with teenage grandson. Patient provides history. Of note, his wife passed away 2 days ago and they are preparing for the. Remer Macho   History   Chief Complaint Chief Complaint  Patient presents with  . Facial Pain  Sinus sxs  HPI [name] is a 76 y.o. male.   The history is provided by the patient.   SINUSITIS  Onset: 2 wks Facial/sinus pressure with clear nasal mucus.    Severity: moderate Tried Flonase, Guaifenesin, chlortrimeton  with some relief.  Symptoms:  No definite fever  + URI prodrome with nasal congestion No swollen neck glands + mild Sinus Headache + minimal bilat ear pressure  + seasonal Allergy symptoms No significant Sore Throat No eye symptoms     No significant Cough No chest pain No shortness of breath  No wheezing  No Abdominal Pain No Nausea No Vomiting No diarrhea  No Myalgias No focal neurologic symptoms No syncope No Rash  No Urinary symptoms  Remainder of Review of Systems negative except as noted in the HPI.  Past Medical History:  Diagnosis Date  . Arthritis   . Chronic lower back pain    "last 3 months; usually when I bend" (12/24/2011)  . Coronary artery disease   . Endocarditis   . GERD (gastroesophageal reflux disease)   . Heart murmur   . High cholesterol   . Hypertension   . Migraines    "none in the last couple years" (12/24/2011)  . Myocardial infarction    POSSIBLE 2012 ELEVATED ENZYMES  . Pneumonia ~ 2011    Patient Active Problem List   Diagnosis Date Noted  . Hypotension 05/06/2016  . Atrial fibrillation with RVR ([place]) 05/06/2016  . AKI (acute kidney injury) ([place]) 05/06/2016  . SVT (supraventricular tachycardia) ([place]) 01/31/2015  . Colonic mass, right colon. 11/10/2011  . Acute bacterial endocarditis 11/06/2011  . HTN (hypertension) 11/06/2011  . BPH (benign prostatic hyperplasia) 11/06/2011  .  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 Back pain 11/06/2011  . Hyperlipidemia 11/06/2011  . Acute renal insufficiency 08/25/2010  . Viridans streptococci infection 08/25/2010  . Streptococcus infection 08/25/2010  . Tooth infection 08/25/2010  . Status post PICC central line placement 08/25/2010  . Arm pain, right 08/25/2010  . ENDOCARDITIS, BACTERIAL, SUBACUTE 05/07/2010  . Iron deficiency anemia 05/06/2010  . Hypertrophic obstructive cardiomyopathy ([place]) 05/06/2010  . SYNCOPE 05/06/2010  . DYSPNEA ON EXERTION 05/06/2010  . ECHOCARDIOGRAM, ABNORMAL 05/06/2010  . HEART MURMUR, HX OF 05/06/2010    Past Surgical History:  Procedure Laterality Date  . CARDIOVERSION N/A 10/29/2015   Procedure: CARDIOVERSION;  Surgeon: Adrian Prows, MD;  Location: [facility];  Service: Cardiovascular;  Laterality: N/A;  . COLON RESECTION  12/25/2011   Procedure: COLON RESECTION LAPAROSCOPIC;  Surgeon: Shann Medal, MD;  Location: [facility];  Service: General;  Laterality: Right;  laparoscopic assisted right colon resection  . COLONOSCOPY  11/11/2011   Procedure: COLONOSCOPY;  Surgeon: Missy Sabins, MD;  Location: [facility];  Service: Endoscopy;  Laterality: N/A;  . CORONARY ANGIOPLASTY WITH STENT PLACEMENT  11/11/2010   "1"  . CYSTECTOMY  ~ 1980   right eyelid  . TEE WITHOUT CARDIOVERSION  11/06/2011   Procedure: TRANSESOPHAGEAL ECHOCARDIOGRAM (TEE);  Surgeon: Laverda Page, MD;  Location: [facility];  Service: Cardiovascular;  Laterality: N/A;       Home Medications    Prior to Admission  medications   Medication Sig Start Date End Date Taking? Authorizing Provider  amiodarone (PACERONE) 200 MG tablet Take 1 tablet (200 mg total) by mouth daily. 02/05/16   Evans Lance, MD  apixaban (ELIQUIS) 5 MG TABS tablet Take 5 mg by mouth 2 (two) times daily.    Historical Provider, MD  Ascorbic Acid (VITAMIN C) 500 MG tablet Take 500 mg by mouth 2 (two) times daily.      Historical Provider, MD  azithromycin  (ZITHROMAX Z-PAK) 250 MG tablet Take 2 tablets on day one, then 1 tablet daily on days 2 through 5 06/26/16   Jacqulyn Cane, MD  b complex vitamins tablet Take 1 tablet by mouth daily.    Historical Provider, MD  Cholecalciferol (VITAMIN D3 PO) Take 1 capsule by mouth daily.    Historical Provider, MD  clotrimazole (LOTRIMIN) 1 % cream Apply to affected area 2 times daily 05/25/16   Sherlene Shams, MD  Coenzyme Q10 (EQL COQ10) 300 MG CAPS Take 1 capsule by mouth every morning.     Historical Provider, MD  glucosamine-chondroitin 500-400 MG tablet Take 1 tablet by mouth 2 (two) times daily.     Historical Provider, MD  HAWTHORN BERRY PO Take 1 capsule by mouth every morning.     Historical Provider, MD  Magnesium 250 MG TABS Take 250 mg by mouth 2 (two) times daily.     Historical Provider, MD  metoprolol succinate (TOPROL-XL) 25 MG 24 hr tablet Take 1 tablet (25 mg total) by mouth daily. 05/08/16   Geradine Girt, DO  Multiple Vitamin (MULTIVITAMIN WITH MINERALS) TABS Take 1 tablet by mouth every morning.     Historical Provider, MD  OVER THE COUNTER MEDICATION Take 1,000 mg by mouth every morning. BILLBERRY CAPSULE 1000MG     Historical Provider, MD  Potassium (POTASSIMIN PO) Take 1 tablet by mouth daily.    Historical Provider, MD  pravastatin (PRAVACHOL) 40 MG tablet Take 40 mg by mouth at bedtime.     Historical Provider, MD  predniSONE (DELTASONE) 20 MG tablet Take 1 tablet (20 mg total) by mouth 2 (two) times daily with a meal. For 5 days 06/26/16   Jacqulyn Cane, MD  ranitidine (ZANTAC) 75 MG tablet Take 75 mg by mouth 2 (two) times daily.    Historical Provider, MD  saw palmetto 160 MG capsule Take 160 mg by mouth 2 (two) times daily.     Historical Provider, MD  vitamin E 400 UNIT capsule Take 400 Units by mouth daily.     Historical Provider, MD  VITAMIN K PO Take 1 capsule by mouth daily.    Historical Provider, MD    Family History Family History  Problem Relation Age of Onset  .  Hypertension Mother   . Heart failure Father   . Cancer Neg Hx   . Diabetes Neg Hx   . Hyperlipidemia Neg Hx   . Sudden death Neg Hx   . Stroke Neg Hx   . Heart attack Neg Hx     Social History Social History  Substance Use Topics  . Smoking status: Never Smoker  . Smokeless tobacco: Never Used     Comment: 12/24/2011 "used to puff cigarettes; never inhaled"  . Alcohol use No     Allergies   Brilinta [ticagrelor] and Penicillins   Review of Systems Review of Systems  Eyes: Negative for discharge.  Respiratory: Negative for shortness of breath.   Cardiovascular: Negative for chest pain and palpitations.  Gastrointestinal: Negative for abdominal pain.  Musculoskeletal: Negative for neck pain.  Skin: Negative for rash.  Psychiatric/Behavioral: Negative for confusion, hallucinations and suicidal ideas.  All other systems reviewed and are negative. see also hpi   Physical Exam Triage Vital Signs ED Triage Vitals  Enc Vitals Group     BP 06/26/16 1437 109/66     Pulse Rate 06/26/16 1437 78     Resp --      Temp 06/26/16 1437 98 F (36.7 C)     Temp Source 06/26/16 1437 Oral     SpO2 06/26/16 1437 98 %     Weight 06/26/16 1438 183 lb (83 kg)     Height --      Head Circumference --      Peak Flow --      Pain Score 06/26/16 1441 0     Pain Loc --      Pain Edu? --      Excl. in [place]? --    No data found.   Updated Vital Signs BP 109/66 (BP Location: Right Arm)   Pulse 78   Temp 98 F (36.7 C) (Oral)   Wt 183 lb (83 kg)   SpO2 98%   BMI 24.82 kg/m    Physical Exam  Constitutional: He is oriented to person, place, and time. He appears well-developed and well-nourished. No distress.  HENT:  Head: Normocephalic and atraumatic.  Right Ear: Tympanic membrane, external ear and ear canal normal.  Left Ear: Tympanic membrane, external ear and ear canal normal.  Nose: Mucosal edema and rhinorrhea (serous) present. Right sinus exhibits maxillary sinus  tenderness. Right sinus exhibits no frontal sinus tenderness. Left sinus exhibits maxillary sinus tenderness. Left sinus exhibits no frontal sinus tenderness.  Mouth/Throat: Oropharynx is clear and moist. No oral lesions. No oropharyngeal exudate.  Mild bilat SOM, otherwise tm's normal  Eyes: Right eye exhibits no discharge. Left eye exhibits no discharge. No scleral icterus.  Neck: Neck supple.  Cardiovascular: Normal rate and regular rhythm.  Exam reveals no S3 and no S4.   Murmur heard.  Systolic murmur is present with a grade of 2/6  (he states he's had chronic murmur)  Pulmonary/Chest: Effort normal and breath sounds normal. He has no wheezes. He has no rales.  Lymphadenopathy:    He has no cervical adenopathy.  Neurological: He is alert and oriented to person, place, and time.  Skin: Skin is warm and dry. No rash noted.  Nursing note and vitals reviewed.    UC Treatments / Results  Labs (all labs ordered are listed, but only abnormal results are displayed) Labs Reviewed - No data to display  EKG  EKG Interpretation None       Radiology No results found.  Procedures Procedures (including critical care time)  Medications Ordered in UC Medications - No data to display   Initial Impression / Assessment and Plan / UC Course  I have reviewed the triage vital signs and the nursing notes.  Pertinent labs & imaging results that were available during my care of the patient were reviewed by me and considered in my medical decision making (see chart for details).       Final Clinical Impressions(s) / UC Diagnoses  Sinus/Upper Resp sx's most likely allergic and/or viral cause.   Final diagnoses:  Allergic cough  Bronchitis  Allergic sinusitis  An After Visit Summary was printed and given to the patient. "Most likely, you have allergic or viral sinusitis/bronchitis. Continue the  Flonase and guaifenesin that you're using. Start new prescription for  prednisone--already sent to your pharmacy. Hold on to the prescription for azithromycin Z-Pak.-Fill this if you're not improving in 2 days or if you have fever or colored mucus or other signs of bacterial infection. Follow-up with your family doctor if not better in a week, or sooner if worse"   New Prescriptions Discharge Medication List as of 06/26/2016  3:29 PM    START taking these medications   Details  azithromycin (ZITHROMAX Z-PAK) 250 MG tablet Take 2 tablets on day one, then 1 tablet daily on days 2 through 5, Print    predniSONE (DELTASONE) 20 MG tablet Take 1 tablet (20 mg total) by mouth 2 (two) times daily with a meal. For 5 days, Starting Fri 06/26/2016, Normal         Jacqulyn Cane, MD 06/26/16 1735

## 2016-06-26 NOTE — ED Triage Notes (Signed)
Pt c/o cough with mucous. Sinus pressure and post nasal drip for about 2 weeks. States he is using flonase and mucinex.

## 2016-06-26 NOTE — Discharge Instructions (Signed)
Most likely, you have allergic or viral sinusitis/bronchitis. Continue the Flonase and guaifenesin that you're using. Start new prescription for prednisone--already sent to your pharmacy. Hold on to the prescription for azithromycin Z-Pak.-Fill this if you're not improving in 2 days or if you have fever or colored mucus or other signs of bacterial infection. Follow-up with your family doctor if not better in a week, or sooner if worse

## 2016-07-01 ENCOUNTER — Encounter: Payer: Self-pay | Admitting: Physician Assistant

## 2016-07-07 ENCOUNTER — Encounter (HOSPITAL_COMMUNITY): Payer: Self-pay | Admitting: Emergency Medicine

## 2016-07-07 ENCOUNTER — Ambulatory Visit (HOSPITAL_COMMUNITY)
Admission: EM | Admit: 2016-07-07 | Discharge: 2016-07-07 | Disposition: A | Discharge status: Home / Self Care | Payer: Medicare Other | Attending: Family Medicine | Admitting: Family Medicine

## 2016-07-07 ENCOUNTER — Ambulatory Visit
Admission: RE | Admit: 2016-07-07 | Discharge: 2016-07-07 | Disposition: A | Discharge status: Home / Self Care | Payer: Medicare Other | Source: Ambulatory Visit | Attending: Cardiology | Admitting: Cardiology

## 2016-07-07 ENCOUNTER — Other Ambulatory Visit: Payer: Self-pay | Admitting: Cardiology

## 2016-07-07 DIAGNOSIS — I4891 Unspecified atrial fibrillation: Secondary | ICD-10-CM

## 2016-07-07 DIAGNOSIS — J9801 Acute bronchospasm: Secondary | ICD-10-CM | POA: Diagnosis not present

## 2016-07-07 DIAGNOSIS — J3489 Other specified disorders of nose and nasal sinuses: Secondary | ICD-10-CM

## 2016-07-07 DIAGNOSIS — Z5181 Encounter for therapeutic drug level monitoring: Secondary | ICD-10-CM

## 2016-07-07 DIAGNOSIS — R05 Cough: Secondary | ICD-10-CM | POA: Diagnosis not present

## 2016-07-07 MED ORDER — ALBUTEROL SULFATE HFA 108 (90 BASE) MCG/ACT IN AERS: 1.0000 | INHALATION_SPRAY | Freq: Four times a day (QID) | RESPIRATORY_TRACT | 0 refills | Status: AC | PRN

## 2016-07-07 MED ORDER — IPRATROPIUM BROMIDE 0.06 % NA SOLN: NASAL | 12 refills | Status: AC

## 2016-07-07 NOTE — ED Triage Notes (Signed)
The patient presented to the [facility] with a complaint of a cough x 1 week.

## 2016-07-07 NOTE — ED Provider Notes (Signed)
CSN: [id number]     Arrival date & time 07/07/16  1227 History   None    Chief Complaint  Patient presents with  . Cough   (Consider location/radiation/quality/duration/timing/severity/associated sxs/prior Treatment) 76 year old man complaining of cough for 9-10 days. He was recently seen in the urgent care and [facility] and the cough was attributed to sinus congestion and possible drainage. He was treated with prednisone and a prescription for Z-Pak if he developed a fever. The patient states he never developed a fever and he did not take the Z-Pak. His cough has not improved. Occasionally he will have some shortness of breath. He has a history of atrial fibrillation treated for rate control with amiodarone and Toprol.      Past Medical History:  Diagnosis Date  . Arthritis   . Chronic lower back pain    "last 3 months; usually when I bend" (12/24/2011)  . Coronary artery disease   . Endocarditis   . GERD (gastroesophageal reflux disease)   . Heart murmur   . High cholesterol   . Hypertension   . Migraines    "none in the last couple years" (12/24/2011)  . Myocardial infarction    POSSIBLE 2012 ELEVATED ENZYMES  . Pneumonia ~ 2011   Past Surgical History:  Procedure Laterality Date  . CARDIOVERSION N/A 10/29/2015   Procedure: CARDIOVERSION;  Surgeon: Adrian Prows, MD;  Location: [facility];  Service: Cardiovascular;  Laterality: N/A;  . COLON RESECTION  12/25/2011   Procedure: COLON RESECTION LAPAROSCOPIC;  Surgeon: Shann Medal, MD;  Location: [facility];  Service: General;  Laterality: Right;  laparoscopic assisted right colon resection  . COLONOSCOPY  11/11/2011   Procedure: COLONOSCOPY;  Surgeon: Missy Sabins, MD;  Location: [facility];  Service: Endoscopy;  Laterality: N/A;  . CORONARY ANGIOPLASTY WITH STENT PLACEMENT  11/11/2010   "1"  . CYSTECTOMY  ~ 1980   right eyelid  . TEE WITHOUT CARDIOVERSION  11/06/2011   Procedure: TRANSESOPHAGEAL ECHOCARDIOGRAM (TEE);  Surgeon:  Laverda Page, MD;  Location[facility];  Service: Cardiovascular;  Laterality: N/A;   Family History  Problem Relation Age of Onset  . Hypertension Mother   . Heart failure Father   . Cancer Neg Hx   . Diabetes Neg Hx   . Hyperlipidemia Neg Hx   . Sudden death Neg Hx   . Stroke Neg Hx   . Heart attack Neg Hx    Social History  Substance Use Topics  . Smoking status: Never Smoker  . Smokeless tobacco: Never Used     Comment: 12/24/2011 "used to puff cigarettes; never inhaled"  . Alcohol use No    Review of Systems  Constitutional: Negative.  Negative for fever.  HENT: Positive for rhinorrhea. Negative for ear pain and sore throat.   Eyes: Negative.   Respiratory: Positive for cough.        Occasional shortness of breath with exertion since he has had a cough.  Gastrointestinal: Negative.   Musculoskeletal: Negative.   Skin: Negative.   Neurological: Negative.   All other systems reviewed and are negative.   Allergies  Brilinta [ticagrelor] and Penicillins  Home Medications   Prior to Admission medications   Medication Sig Start Date End Date Taking? Authorizing Provider  albuterol (PROVENTIL HFA;VENTOLIN HFA) 108 (90 Base) MCG/ACT inhaler Inhale 1-2 puffs into the lungs every 6 (six) hours as needed for wheezing or shortness of breath. 07/07/16   Janne Napoleon, NP  amiodarone (PACERONE) 200 MG tablet  Take 1 tablet (200 mg total) by mouth daily. 02/05/16   Evans Lance, MD  apixaban (ELIQUIS) 5 MG TABS tablet Take 5 mg by mouth 2 (two) times daily.    Historical Provider, MD  Ascorbic Acid (VITAMIN C) 500 MG tablet Take 500 mg by mouth 2 (two) times daily.      Historical Provider, MD  azithromycin (ZITHROMAX Z-PAK) 250 MG tablet Take 2 tablets on day one, then 1 tablet daily on days 2 through 5 06/26/16   Jacqulyn Cane, MD  b complex vitamins tablet Take 1 tablet by mouth daily.    Historical Provider, MD  Cholecalciferol (VITAMIN D3 PO) Take 1 capsule by mouth daily.     Historical Provider, MD  clotrimazole (LOTRIMIN) 1 % cream Apply to affected area 2 times daily 05/25/16   Sherlene Shams, MD  Coenzyme Q10 (EQL COQ10) 300 MG CAPS Take 1 capsule by mouth every morning.     Historical Provider, MD  glucosamine-chondroitin 500-400 MG tablet Take 1 tablet by mouth 2 (two) times daily.     Historical Provider, MD  HAWTHORN BERRY PO Take 1 capsule by mouth every morning.     Historical Provider, MD  ipratropium (ATROVENT) 0.06 % nasal spray 2 sprays into each nostril 4 times a day as needed for runny nose. 07/07/16   Janne Napoleon, NP  Magnesium 250 MG TABS Take 250 mg by mouth 2 (two) times daily.     Historical Provider, MD  metoprolol succinate (TOPROL-XL) 25 MG 24 hr tablet Take 1 tablet (25 mg total) by mouth daily. 05/08/16   Geradine Girt, DO  Multiple Vitamin (MULTIVITAMIN WITH MINERALS) TABS Take 1 tablet by mouth every morning.     Historical Provider, MD  OVER THE COUNTER MEDICATION Take 1,000 mg by mouth every morning. BILLBERRY CAPSULE 1000MG     Historical Provider, MD  Potassium (POTASSIMIN PO) Take 1 tablet by mouth daily.    Historical Provider, MD  pravastatin (PRAVACHOL) 40 MG tablet Take 40 mg by mouth at bedtime.     Historical Provider, MD  predniSONE (DELTASONE) 20 MG tablet Take 1 tablet (20 mg total) by mouth 2 (two) times daily with a meal. For 5 days 06/26/16   Jacqulyn Cane, MD  ranitidine (ZANTAC) 75 MG tablet Take 75 mg by mouth 2 (two) times daily.    Historical Provider, MD  saw palmetto 160 MG capsule Take 160 mg by mouth 2 (two) times daily.     Historical Provider, MD  vitamin E 400 UNIT capsule Take 400 Units by mouth daily.     Historical Provider, MD  VITAMIN K PO Take 1 capsule by mouth daily.    Historical Provider, MD   Meds Ordered and Administered this Visit  Medications - No data to display  BP 122/69 (BP Location: Right Arm)   Pulse 83   Temp 98 F (36.7 C) (Oral)   Resp 14   SpO2 98%  No data found.   Physical Exam   Constitutional: He is oriented to person, place, and time. He appears well-developed and well-nourished. No distress.  HENT:  Head: Normocephalic and atraumatic.  Right Ear: External ear normal.  Left Ear: External ear normal.  Mouth/Throat: Oropharynx is clear and moist. No oropharyngeal exudate.  Clear rhinorrhea and having to blow nose during the exam.  Eyes: EOM are normal.  Neck: Neck supple.  Cardiovascular: Intact distal pulses.   Irregularly irregular rate at 88. Holosystolic murmur.  Pulmonary/Chest: Effort  normal. No respiratory distress.  Forced expiration reveals bilateral diffuse distant wheeze on expiration. Mildly prolonged expiratory phase.  Musculoskeletal: Normal range of motion. He exhibits no edema.  Lymphadenopathy:    He has no cervical adenopathy.  Neurological: He is alert and oriented to person, place, and time.  Skin: Skin is warm and dry.  Nursing note and vitals reviewed.   Urgent Care Course     Procedures (including critical care time)  Labs Review Labs Reviewed - No data to display  Imaging Review No results found.   Visual Acuity Review  Right Eye Distance:   Left Eye Distance:   Bilateral Distance:    Right Eye Near:   Left Eye Near:    Bilateral Near:         MDM   1. Cough due to bronchospasm   2. Rhinorrhea    Started out with 1 puff every 4 hours as needed for cough due to wheezing. If one puff is insufficient may use 2 puffs. He also have been prescribed a nasal spray which is grade for constant runny nose. It is not like any of those over-the-counter. Has very few if any side effects. Follow-up with your doctor next week as scheduled. In the meantime if using the inhaler he developed chest pain or a significant increase in your heart rate stop using it and call your doctor. Meds ordered this encounter  Medications  . albuterol (PROVENTIL HFA;VENTOLIN HFA) 108 (90 Base) MCG/ACT inhaler    Sig: Inhale 1-2 puffs into the  lungs every 6 (six) hours as needed for wheezing or shortness of breath.    Dispense:  1 Inhaler    Refill:  0    Order Specific Question:   Supervising Provider    Answer:   Robyn Haber [[id number]]  . ipratropium (ATROVENT) 0.06 % nasal spray    Sig: 2 sprays into each nostril 4 times a day as needed for runny nose.    Dispense:  15 mL    Refill:  12    Order Specific Question:   Supervising Provider    Answer:   Robyn Haber [[id number]]       Janne Napoleon, NP 07/07/16 1319

## 2016-07-07 NOTE — Discharge Instructions (Signed)
Started out with 1 puff every 4 hours as needed for cough due to wheezing. If one puff is insufficient may use 2 puffs. He also have been prescribed a nasal spray which is grade for constant runny nose. It is not like any of those over-the-counter. Has very few if any side effects. Follow-up with your doctor next week as scheduled. In the meantime if using the inhaler he developed chest pain or a significant increase in your heart rate stop using it and call your doctor.

## 2016-07-14 NOTE — Progress Notes (Signed)
Cardiology Office Note Date:  07/15/2016  Patient ID:  William, [name] 1940/10/26, MRN [id number] PCP:  [name], MD  Cardiologist:  Dr. [name]   Chief Complaint: palpitations  History of Present Illness: [name] is a 76 y.o. male with history of HTN, HLD, PAFib/flutter/Atach, and ?CAD, severe LVH, January had a short hospital stay with RAFib, earlier this month treated at the ED for a cough (s/p URI) treated with inhalers, not admitted.  He comes in today to be seen for Dr. [name], last seen by him during the hospital stay in [place], given multiple different atrial arrhythmias and atriopathy not felt to be an ablation candidate and maintaned on amiodarone.  He reports he feels like the amiodarone isn't working as well as it did when he first started it.  In the last month or so he has daily palpitations, some are moments of a fluttering, other times several minutes, rarely as long as an hour, he feels like he doesn't have as much energy in the last few months, or has periods in a day that he has good energy and other s not.  He gives an example of feeling well, walks outside to do a couple things and then on the way back in feels wiped out.  No dizziness, near syncope or syncope, not weak, but tired.  He gets heartburn with certain foods, no discomfort or complaints of CP otherwise  He will infrequently get a slight nose bleed, nothing that persists or is difficult to stop and has a nsal spray to help with this, no bleeding or signs of bleeding, he denies melena.  Past Medical History:  Diagnosis Date  . Arthritis   . Chronic lower back pain    "last 3 months; usually when I bend" (12/24/2011)  . Coronary artery disease   . Endocarditis   . GERD (gastroesophageal reflux disease)   . Heart murmur   . High cholesterol   . Hypertension   . Migraines    "none in the last couple years" (12/24/2011)  . Myocardial infarction    POSSIBLE 2012 ELEVATED ENZYMES  . Pneumonia ~  2011    Past Surgical History:  Procedure Laterality Date  . CARDIOVERSION N/A 10/29/2015   Procedure: CARDIOVERSION;  Surgeon: Adrian Prows, MD;  Location: [place];  Service: Cardiovascular;  Laterality: N/A;  . COLON RESECTION  12/25/2011   Procedure: COLON RESECTION LAPAROSCOPIC;  Surgeon: Shann Medal, MD;  Location: [place];  Service: General;  Laterality: Right;  laparoscopic assisted right colon resection  . COLONOSCOPY  11/11/2011   Procedure: COLONOSCOPY;  Surgeon: Missy Sabins, MD;  Location: [place];  Service: Endoscopy;  Laterality: N/A;  . CORONARY ANGIOPLASTY WITH STENT PLACEMENT  11/11/2010   "1"  . CYSTECTOMY  ~ 1980   right eyelid  . TEE WITHOUT CARDIOVERSION  11/06/2011   Procedure: TRANSESOPHAGEAL ECHOCARDIOGRAM (TEE);  Surgeon: Laverda Page, MD;  Location: [place];  Service: Cardiovascular;  Laterality: N/A;    Current Outpatient Prescriptions  Medication Sig Dispense Refill  . albuterol (PROVENTIL HFA;VENTOLIN HFA) 108 (90 Base) MCG/ACT inhaler Inhale 1-2 puffs into the lungs every 6 (six) hours as needed for wheezing or shortness of breath. 1 Inhaler 0  . amiodarone (PACERONE) 200 MG tablet Take 1 tablet (200 mg total) by mouth daily. 180 tablet 3  . apixaban (ELIQUIS) 5 MG TABS tablet Take 5 mg by mouth 2 (two) times daily.    . Ascorbic Acid (VITAMIN  C) 500 MG tablet Take 500 mg by mouth 2 (two) times daily.      Marland Kitchen b complex vitamins tablet Take 1 tablet by mouth daily.    . Cholecalciferol (VITAMIN D3 PO) Take 1 capsule by mouth daily.    . clotrimazole (LOTRIMIN) 1 % cream Apply to affected area 2 times daily 15 g 0  . Coenzyme Q10 (EQL COQ10) 300 MG CAPS Take 1 capsule by mouth every morning.     Marland Kitchen glucosamine-chondroitin 500-400 MG tablet Take 1 tablet by mouth 2 (two) times daily.     Marland Kitchen HAWTHORN BERRY PO Take 1 capsule by mouth every morning.     Marland Kitchen ipratropium (ATROVENT) 0.06 % nasal spray 2 sprays into each nostril 4 times a day as needed for  runny nose. 15 mL 12  . Magnesium 250 MG TABS Take 250 mg by mouth 2 (two) times daily.     . metoprolol succinate (TOPROL-XL) 25 MG 24 hr tablet Take 1 tablet (25 mg total) by mouth daily. 30 tablet 0  . Multiple Vitamin (MULTIVITAMIN WITH MINERALS) TABS Take 1 tablet by mouth every morning.     Marland Kitchen OVER THE COUNTER MEDICATION Take 1,000 mg by mouth every morning. BILLBERRY CAPSULE 1000MG     . Potassium (POTASSIMIN PO) Take 1 tablet by mouth daily.    . pravastatin (PRAVACHOL) 40 MG tablet Take 40 mg by mouth at bedtime.     . predniSONE (DELTASONE) 20 MG tablet Take 1 tablet (20 mg total) by mouth 2 (two) times daily with a meal. For 5 days 10 tablet 0  . ranitidine (ZANTAC) 75 MG tablet Take 75 mg by mouth 2 (two) times daily.    . saw palmetto 160 MG capsule Take 160 mg by mouth 2 (two) times daily.     . vitamin E 400 UNIT capsule Take 400 Units by mouth daily.     Marland Kitchen VITAMIN K PO Take 1 capsule by mouth daily.     No current facility-administered medications for this visit.     Allergies:   Brilinta [ticagrelor] and Penicillins   Social History:  The patient  reports that he has never smoked. He has never used smokeless tobacco. He reports that he does not drink alcohol or use drugs.   Family History:  The patient's family history includes Heart failure in his father; Hypertension in his mother.  ROS:  Please see the history of present illness. All other systems are reviewed and otherwise negative.   PHYSICAL EXAM:  VS:  BP 132/70   Pulse 81   Ht 6' (1.829 m)   Wt 185 lb (83.9 kg)   BMI 25.09 kg/m  BMI: Body mass index is 25.09 kg/m. Well nourished, well developed, in no acute distress  No palor HEENT: normocephalic, atraumatic  Neck: no JVD, carotid bruits or masses Cardiac:  RRR; no significant murmurs, no rubs, or gallops Lungs:  CTA b/l, no wheezing, rhonchi or rales  Abd: soft, nontender MS: no deformity or atrophy Ext: no edema  Skin: warm and dry, no rash Neuro:   No gross deficits appreciated Psych: euthymic mood, full affect   EKG:  Done 05/06/16 showed SR, 1st degree AVblock, LBBB, PR 234ms, QRS 172ms, PAC's 10/18/14: TTE Severe concentric LVH, grade II DD, LVEF 56%, lA severely dilated (58cm),  small secudum ASD probably due to stretched out septum Mild AI/AS Mild ca++ MV with SAM, mod MR Mild-mod TR PASP 37mmHg  Recent Labs: 05/06/2016: Magnesium 2.3; TSH  2.572 05/07/2016: BUN 19; Creatinine, Ser 1.31; Hemoglobin 9.8; Platelets 131; Potassium 4.3; Sodium 141  No results found for requested labs within last 8760 hours.   CrCl cannot be calculated (Patient's most recent lab result is older than the maximum 21 days allowed.).   Wt Readings from Last 3 Encounters:  07/15/16 185 lb (83.9 kg)  06/26/16 183 lb (83 kg)  05/07/16 190 lb 9.6 oz (86.5 kg)     Other studies reviewed: Additional studies/records reviewed today include: summarized above  ASSESSMENT AND PLAN:   1. Paroxysmal Afib, flutter, Atach     CHA2DS2Vasc is at least 2 on Eliquis     Amiodarone     05/06/16 TSH 2.572     01/13/15 LFTs wnl         05/07/16: Creat 1.31, H/H 9.8/30.5  2. LVH, Diastolic dysfunction     exam appears euvolemic, weight is down 7 pounds from last  3. HTN     Is OK today, no changes   Will start with BMET, CBC, LFTs, TSH given his meds, and a 48 hour holter to assess his daily palpitations.  Discussed at length AF, importance of his a/c and not missing any doses.  Will have him follow up with the AFib clinic to get established with them as well, await labs, if OK, H/H stable will up-titrate his BB to start.   Disposition: F/u with AF clinic in 1 month, sooner if needed.  Current medicines are reviewed at length with the patient today.  The patient did not have any concerns regarding medicines.  Haywood Lasso, PA-C 07/15/2016 2:36 PM     [place] [address] [phone] (office)    [phone] (fax)

## 2016-07-15 ENCOUNTER — Ambulatory Visit (INDEPENDENT_AMBULATORY_CARE_PROVIDER_SITE_OTHER): Payer: Medicare Other | Admitting: Physician Assistant

## 2016-07-15 VITALS — BP 132/70 | HR 81 | Ht 72.0 in | Wt 185.0 lb

## 2016-07-15 DIAGNOSIS — I517 Cardiomegaly: Secondary | ICD-10-CM | POA: Diagnosis not present

## 2016-07-15 DIAGNOSIS — I519 Heart disease, unspecified: Secondary | ICD-10-CM | POA: Diagnosis not present

## 2016-07-15 DIAGNOSIS — I5189 Other ill-defined heart diseases: Secondary | ICD-10-CM

## 2016-07-15 DIAGNOSIS — I119 Hypertensive heart disease without heart failure: Secondary | ICD-10-CM

## 2016-07-15 DIAGNOSIS — I480 Paroxysmal atrial fibrillation: Secondary | ICD-10-CM | POA: Diagnosis not present

## 2016-07-15 DIAGNOSIS — R002 Palpitations: Secondary | ICD-10-CM

## 2016-07-15 NOTE — Patient Instructions (Signed)
Medication Instructions:   Your physician recommends that you continue on your current medications as directed. Please refer to the Current Medication list given to you today.  If you need a refill on your cardiac medications before your next appointment, please call your pharmacy.  Labwork: BMET CBC TSH AND LFT    Testing/Procedures:  Your physician has recommended that you wear a 48 HOUR  holter monitor. Holter monitors are medical devices that record the heart's electrical activity. Doctors most often use these monitors to diagnose arrhythmias. Arrhythmias are problems with the speed or rhythm of the heartbeat. The monitor is a small, portable device. You can wear one while you do your normal daily activities. This is usually used to diagnose what is causing palpitations/syncope (passing out).      Follow-Up:  IN ONE MONTH  WITH AFIB CLINIC   Any Other Special Instructions Will Be Listed Below (If Applicable).

## 2016-07-16 LAB — HEPATIC FUNCTION PANEL
ALBUMIN: 4.2 g/dL (ref 3.5–4.8)
ALT: 31 IU/L (ref 0–44)
AST: 35 IU/L (ref 0–40)
Alkaline Phosphatase: 75 IU/L (ref 39–117)
BILIRUBIN TOTAL: 0.5 mg/dL (ref 0.0–1.2)
Bilirubin, Direct: 0.2 mg/dL (ref 0.00–0.40)
TOTAL PROTEIN: 7 g/dL (ref 6.0–8.5)

## 2016-07-16 LAB — BASIC METABOLIC PANEL
BUN/Creatinine Ratio: 17 (ref 10–24)
BUN: 22 mg/dL (ref 8–27)
CHLORIDE: 102 mmol/L (ref 96–106)
CO2: 27 mmol/L (ref 18–29)
Calcium: 9.2 mg/dL (ref 8.6–10.2)
Creatinine, Ser: 1.32 mg/dL — ABNORMAL HIGH (ref 0.76–1.27)
GFR calc Af Amer: 61 mL/min/{1.73_m2} (ref >59)
GFR calc non Af Amer: 53 mL/min/{1.73_m2} — ABNORMAL LOW (ref >59)
GLUCOSE: 94 mg/dL (ref 65–99)
Potassium: 4.9 mmol/L (ref 3.5–5.2)
Sodium: 140 mmol/L (ref 134–144)

## 2016-07-16 LAB — CBC
Hematocrit: 32.4 % — ABNORMAL LOW (ref 37.5–51.0)
Hemoglobin: 10.3 g/dL — ABNORMAL LOW (ref 13.0–17.7)
MCH: 28.5 pg (ref 26.6–33.0)
MCHC: 31.8 g/dL (ref 31.5–35.7)
MCV: 90 fL (ref 79–97)
PLATELETS: 210 10*3/uL (ref 150–379)
RBC: 3.61 x10E6/uL — AB (ref 4.14–5.80)
RDW: 15.9 % — ABNORMAL HIGH (ref 12.3–15.4)
WBC: 5.7 10*3/uL (ref 3.4–10.8)

## 2016-07-16 LAB — TSH: TSH: 2.01 u[IU]/mL (ref 0.450–4.500)

## 2016-07-22 ENCOUNTER — Telehealth: Payer: Self-pay

## 2016-07-22 ENCOUNTER — Ambulatory Visit (INDEPENDENT_AMBULATORY_CARE_PROVIDER_SITE_OTHER): Payer: Medicare Other

## 2016-07-22 DIAGNOSIS — R002 Palpitations: Secondary | ICD-10-CM | POA: Diagnosis not present

## 2016-07-22 NOTE — Telephone Encounter (Signed)
Patient in today for holter monitor placement. Monitor technician asked for Triage assistance prior to putting monitor on because patient had complaints of slow HR since lunch. She states he accidentally chewed his Toprol this AM.  The patient states since lunch, his HR was fast. He states his HR went as high as 115. He experienced extreme fatigue and palpitations during the episode. He is currently asymptomatic other than he is tired. Assessed patient. HR=80 and regular.   Instructed the monitor technician to place the holter. Instructed the patient to take medications as instructed and to be careful not to chew his medications. He understands to call if another episode occurs.  He was grateful for assistance.

## 2016-07-27 ENCOUNTER — Telehealth: Payer: Self-pay | Admitting: Internal Medicine

## 2016-07-27 NOTE — Telephone Encounter (Signed)
Call received from [place] regarding the patient's 48 hour holter (originally ordered by Renee, PA) : 4 beat ventricular run & 2nd degree AV block noted.  I left a message for Shelly to please upload for Dr. [name] to review.  It is currently not in his studies to review.

## 2016-07-28 ENCOUNTER — Encounter: Payer: Self-pay | Admitting: Internal Medicine

## 2016-07-28 ENCOUNTER — Ambulatory Visit: Payer: Self-pay | Admitting: Internal Medicine

## 2016-07-28 NOTE — Telephone Encounter (Signed)
He has atrial flutter and I need to see him back to discuss the results of his monitor. GT

## 2016-07-28 NOTE — Telephone Encounter (Signed)
New Message    Pt is returning Sherri call

## 2016-07-28 NOTE — Telephone Encounter (Signed)
-----   Message from Evans Lance, MD sent at 07/28/2016  1:49 PM EDT ----- Regarding: RE: abnormal 48 hour holter monitor results I need to see him back regarding his atrial arrhythmias. GT ----- Message ----- From: Jennefer Bravo Sent: 07/28/2016   8:05 AM To: Stanton Kidney, RN, Evans Lance, MD Subject: abnormal 48 hour holter monitor results        Abnormal 48 hour monitor results available for your review.  Please go to " My [employer] Studies,  Assigned to me,  Reading Work List ",  to review patients monitor and sign the study.   Thank you, Darrick Penna

## 2016-07-28 NOTE — Telephone Encounter (Signed)
lmtcb

## 2016-07-29 NOTE — Telephone Encounter (Signed)
This encounter was created in error - please disregard.

## 2016-07-30 NOTE — Telephone Encounter (Signed)
appt made w/ [name] next Tuesday, 5/1

## 2016-07-31 ENCOUNTER — Encounter: Payer: Self-pay | Admitting: Internal Medicine

## 2016-08-04 ENCOUNTER — Ambulatory Visit (INDEPENDENT_AMBULATORY_CARE_PROVIDER_SITE_OTHER): Payer: Medicare Other | Admitting: Internal Medicine

## 2016-08-04 ENCOUNTER — Encounter: Payer: Self-pay | Admitting: Internal Medicine

## 2016-08-04 ENCOUNTER — Encounter: Payer: Self-pay | Admitting: *Deleted

## 2016-08-04 VITALS — BP 102/66 | HR 78 | Ht 72.0 in | Wt 190.6 lb

## 2016-08-04 DIAGNOSIS — I4892 Unspecified atrial flutter: Secondary | ICD-10-CM

## 2016-08-04 NOTE — Patient Instructions (Signed)
Medication Instructions:  Your physician recommends that you continue on your current medications as directed. Please refer to the Current Medication list given to you today. See instruction sheet for ablation.  Labwork: Lab work to be done today--BMP, CBC  Testing/Procedures: Your physician has recommended that you have an ablation. Catheter ablation is a medical procedure used to treat some cardiac arrhythmias (irregular heartbeats). During catheter ablation, a long, thin, flexible tube is put into a blood vessel in your groin (upper thigh), or neck. This tube is called an ablation catheter. It is then guided to your heart through the blood vessel. Radio frequency waves destroy small areas of heart tissue where abnormal heartbeats may cause an arrhythmia to start. Please see the instruction sheet given to you today. Scheduled for May 9,2018  Follow-Up: Your physician recommends that you schedule a follow-up appointment in: 4 weeks with Dr. [name] after procedure on 5/9    Any Other Special Instructions Will Be Listed Below (If Applicable).     If you need a refill on your cardiac medications before your next appointment, please call your pharmacy.

## 2016-08-04 NOTE — Progress Notes (Signed)
HPI [name] returns today for ongoing followup of multiple atrial arrhythmias. He is a pleasant 76 yo man with PAF, flutter and atrial tachycardia. He has been started on amiodarone and has returned to NSR. Since I saw him last he has redeveloped atrial flutter. He feels fatigue and weakness. He has a very slow atrial flutter cycle length and has had mostly 2:1 AV conduction.  Allergies  Allergen Reactions  . Brilinta [Ticagrelor] Itching and Rash  . Penicillins Rash    Has patient had a PCN reaction causing immediate rash, facial/tongue/throat swelling, SOB or lightheadedness with hypotension: Yes Has patient had a PCN reaction causing severe rash involving mucus membranes or skin necrosis: Yes Has patient had a PCN reaction that required hospitalization No Has patient had a PCN reaction occurring within the last 10 years: No If all of the above answers are "NO", then may proceed with Cephalosporin use.      Current Outpatient Prescriptions  Medication Sig Dispense Refill  . albuterol (PROVENTIL HFA;VENTOLIN HFA) 108 (90 Base) MCG/ACT inhaler Inhale 1-2 puffs into the lungs every 6 (six) hours as needed for wheezing or shortness of breath. 1 Inhaler 0  . amiodarone (PACERONE) 200 MG tablet Take 1 tablet (200 mg total) by mouth daily. 180 tablet 3  . apixaban (ELIQUIS) 5 MG TABS tablet Take 5 mg by mouth 2 (two) times daily.    . Ascorbic Acid (VITAMIN C) 500 MG tablet Take 500 mg by mouth 2 (two) times daily.      Marland Kitchen b complex vitamins tablet Take 1 tablet by mouth daily.    . Cholecalciferol (VITAMIN D3 PO) Take 1 capsule by mouth daily.    . clotrimazole (LOTRIMIN) 1 % cream Apply to affected area 2 times daily 15 g 0  . Coenzyme Q10 (EQL COQ10) 300 MG CAPS Take 1 capsule by mouth every morning.     . Ferrous Sulfate (IRON) 325 (65 Fe) MG TABS Take 1 tablet by mouth daily.    . Glucosamine HCl 1000 MG TABS Take 1 tablet by mouth 2 (two) times daily.    Marland Kitchen HAWTHORN BERRY  PO Take 1 capsule by mouth every morning.     Marland Kitchen ipratropium (ATROVENT) 0.06 % nasal spray 2 sprays into each nostril 4 times a day as needed for runny nose. 15 mL 12  . Magnesium 250 MG TABS Take 250 mg by mouth 2 (two) times daily.     . metoprolol succinate (TOPROL-XL) 25 MG 24 hr tablet Take 1 tablet (25 mg total) by mouth daily. 30 tablet 0  . Multiple Vitamin (MULTIVITAMIN WITH MINERALS) TABS Take 1 tablet by mouth every morning.     . nitroGLYCERIN (NITROSTAT) 0.4 MG SL tablet Place 0.4 mg under the tongue every 5 (five) minutes x 3 doses as needed for chest pain (Call 911 at 3rd dose within 15 minutes.).    Marland Kitchen OVER THE COUNTER MEDICATION Take 1,000 mg by mouth every morning. BILLBERRY CAPSULE 1000MG     . Potassium (POTASSIMIN PO) Take 1 tablet by mouth daily.    . pravastatin (PRAVACHOL) 40 MG tablet Take 40 mg by mouth at bedtime.     . ranitidine (ZANTAC) 75 MG tablet Take 75 mg by mouth 2 (two) times daily.    . saw palmetto 160 MG capsule Take 160 mg by mouth 2 (two) times daily.     . vitamin E 400 UNIT capsule Take 400 Units by mouth daily.     Marland Kitchen  VITAMIN K PO Take 1 capsule by mouth daily.     No current facility-administered medications for this visit.      Past Medical History:  Diagnosis Date  . Arthritis   . Chronic lower back pain    "last 3 months; usually when I bend" (12/24/2011)  . Coronary artery disease   . Endocarditis   . GERD (gastroesophageal reflux disease)   . Heart murmur   . High cholesterol   . Hypertension   . Migraines    "none in the last couple years" (12/24/2011)  . Myocardial infarction [facility])    POSSIBLE 2012 ELEVATED ENZYMES  . Pneumonia ~ 2011    ROS:   All systems reviewed and negative except as noted in the HPI.   Past Surgical History:  Procedure Laterality Date  . CARDIOVERSION N/A 10/29/2015   Procedure: CARDIOVERSION;  Surgeon: Adrian Prows, MD;  Location: [place];  Service: Cardiovascular;  Laterality: N/A;  . COLON RESECTION   12/25/2011   Procedure: COLON RESECTION LAPAROSCOPIC;  Surgeon: Shann Medal, MD;  Location: [place];  Service: General;  Laterality: Right;  laparoscopic assisted right colon resection  . COLONOSCOPY  11/11/2011   Procedure: COLONOSCOPY;  Surgeon: Missy Sabins, MD;  Location: [place];  Service: Endoscopy;  Laterality: N/A;  . CORONARY ANGIOPLASTY WITH STENT PLACEMENT  11/11/2010   "1"  . CYSTECTOMY  ~ 1980   right eyelid  . TEE WITHOUT CARDIOVERSION  11/06/2011   Procedure: TRANSESOPHAGEAL ECHOCARDIOGRAM (TEE);  Surgeon: Laverda Page, MD;  Location: [facility]COPY;  Service: Cardiovascular;  Laterality: N/A;     Family History  Problem Relation Age of Onset  . Hypertension Mother   . Heart failure Father   . Cancer Neg Hx   . Diabetes Neg Hx   . Hyperlipidemia Neg Hx   . Sudden death Neg Hx   . Stroke Neg Hx   . Heart attack Neg Hx      Social History   Social History  . Marital status: Married    Spouse name: N/A  . Number of children: N/A  . Years of education: N/A   Occupational History  . Not on file.   Social History Main Topics  . Smoking status: Never Smoker  . Smokeless tobacco: Never Used     Comment: 12/24/2011 "used to puff cigarettes; never inhaled"  . Alcohol use No  . Drug use: No  . Sexual activity: No   Other Topics Concern  . Not on file   Social History Narrative  . No narrative on file     BP 102/66 (BP Location: Left Arm)   Pulse 78   Ht 6' (1.829 m)   Wt 190 lb 9.6 oz (86.5 kg)   BMI 25.85 kg/m   Physical Exam:  Well appearing 76 yo man, NAD HEENT: Unremarkable Neck:  6 cm JVD, no thyromegally Lymphatics:  No adenopathy Back:  No CVA tenderness Lungs:  Clear with no wheezes HEART:  Regular rate rhythm, no murmurs, no rubs, no clicks Abd:  soft, positive bowel sounds, no organomegally, no rebound, no guarding Ext:  2 plus pulses, no edema, no cyanosis, no clubbing Skin:  No rashes no nodules Neuro:  CN II through XII intact,  motor grossly intact  EKG - atrial flutter with 2:1 AV conduction with LBBB   Assess/Plan: 1. PAF/SVT/Atrial flutter - His atrial flutter with 2:1 AV conduction has returned. I have discussed the treatment options with the patient and  have recommended proceeding with EP study and catheter ablation of atrial flutter. 2. CAD - he denies anginal symptoms. 3. HTN - his blood pressure is controlled. Will reassess once he is back in NSR. 4. Coags - he will continue his Eliquis for now.  [name].D.

## 2016-08-05 LAB — CBC WITH DIFFERENTIAL/PLATELET
BASOS: 0 %
Basophils Absolute: 0 10*3/uL (ref 0.0–0.2)
EOS (ABSOLUTE): 0.1 10*3/uL (ref 0.0–0.4)
EOS: 1 %
HEMATOCRIT: 30.2 % — AB (ref 37.5–51.0)
Hemoglobin: 9.8 g/dL — ABNORMAL LOW (ref 13.0–17.7)
IMMATURE GRANS (ABS): 0 10*3/uL (ref 0.0–0.1)
IMMATURE GRANULOCYTES: 0 %
Lymphocytes Absolute: 1.5 10*3/uL (ref 0.7–3.1)
Lymphs: 29 %
MCH: 28.8 pg (ref 26.6–33.0)
MCHC: 32.5 g/dL (ref 31.5–35.7)
MCV: 89 fL (ref 79–97)
MONOS ABS: 0.7 10*3/uL (ref 0.1–0.9)
Monocytes: 14 %
NEUTROS ABS: 2.9 10*3/uL (ref 1.4–7.0)
Neutrophils: 56 %
PLATELETS: 188 10*3/uL (ref 150–379)
RBC: 3.4 x10E6/uL — ABNORMAL LOW (ref 4.14–5.80)
RDW: 16.5 % — AB (ref 12.3–15.4)
WBC: 5.3 10*3/uL (ref 3.4–10.8)

## 2016-08-05 LAB — BASIC METABOLIC PANEL
BUN / CREAT RATIO: 16 (ref 10–24)
BUN: 21 mg/dL (ref 8–27)
CALCIUM: 8.7 mg/dL (ref 8.6–10.2)
CHLORIDE: 101 mmol/L (ref 96–106)
CO2: 25 mmol/L (ref 18–29)
Creatinine, Ser: 1.3 mg/dL — ABNORMAL HIGH (ref 0.76–1.27)
GFR, EST AFRICAN AMERICAN: 62 mL/min/{1.73_m2} (ref >59)
GFR, EST NON AFRICAN AMERICAN: 54 mL/min/{1.73_m2} — AB (ref >59)
Glucose: 83 mg/dL (ref 65–99)
Potassium: 4.7 mmol/L (ref 3.5–5.2)
Sodium: 140 mmol/L (ref 134–144)

## 2016-08-12 ENCOUNTER — Encounter (HOSPITAL_COMMUNITY)
Admission: RE | Disposition: A | Discharge status: Home / Self Care | Payer: Self-pay | Source: Ambulatory Visit | Attending: Internal Medicine

## 2016-08-12 ENCOUNTER — Encounter (HOSPITAL_COMMUNITY): Payer: Self-pay | Admitting: Internal Medicine

## 2016-08-12 ENCOUNTER — Ambulatory Visit (HOSPITAL_COMMUNITY)
Admission: RE | Admit: 2016-08-12 | Discharge: 2016-08-12 | Disposition: A | Discharge status: Home / Self Care | Payer: Medicare Other | Source: Ambulatory Visit | Attending: Internal Medicine | Admitting: Internal Medicine

## 2016-08-12 DIAGNOSIS — I10 Essential (primary) hypertension: Secondary | ICD-10-CM | POA: Insufficient documentation

## 2016-08-12 DIAGNOSIS — I484 Atypical atrial flutter: Secondary | ICD-10-CM | POA: Diagnosis not present

## 2016-08-12 DIAGNOSIS — I2510 Atherosclerotic heart disease of native coronary artery without angina pectoris: Secondary | ICD-10-CM | POA: Insufficient documentation

## 2016-08-12 DIAGNOSIS — I4892 Unspecified atrial flutter: Secondary | ICD-10-CM | POA: Diagnosis present

## 2016-08-12 DIAGNOSIS — Z8249 Family history of ischemic heart disease and other diseases of the circulatory system: Secondary | ICD-10-CM | POA: Diagnosis not present

## 2016-08-12 DIAGNOSIS — Z7901 Long term (current) use of anticoagulants: Secondary | ICD-10-CM | POA: Insufficient documentation

## 2016-08-12 DIAGNOSIS — K219 Gastro-esophageal reflux disease without esophagitis: Secondary | ICD-10-CM | POA: Insufficient documentation

## 2016-08-12 DIAGNOSIS — Z7951 Long term (current) use of inhaled steroids: Secondary | ICD-10-CM | POA: Diagnosis not present

## 2016-08-12 DIAGNOSIS — Z79899 Other long term (current) drug therapy: Secondary | ICD-10-CM | POA: Diagnosis not present

## 2016-08-12 DIAGNOSIS — Z880 Allergy status to penicillin: Secondary | ICD-10-CM | POA: Insufficient documentation

## 2016-08-12 DIAGNOSIS — M1990 Unspecified osteoarthritis, unspecified site: Secondary | ICD-10-CM | POA: Diagnosis not present

## 2016-08-12 DIAGNOSIS — I480 Paroxysmal atrial fibrillation: Secondary | ICD-10-CM | POA: Insufficient documentation

## 2016-08-12 DIAGNOSIS — I483 Typical atrial flutter: Secondary | ICD-10-CM

## 2016-08-12 DIAGNOSIS — I252 Old myocardial infarction: Secondary | ICD-10-CM | POA: Insufficient documentation

## 2016-08-12 DIAGNOSIS — G8929 Other chronic pain: Secondary | ICD-10-CM | POA: Diagnosis not present

## 2016-08-12 DIAGNOSIS — Z955 Presence of coronary angioplasty implant and graft: Secondary | ICD-10-CM | POA: Diagnosis not present

## 2016-08-12 DIAGNOSIS — I471 Supraventricular tachycardia: Secondary | ICD-10-CM | POA: Insufficient documentation

## 2016-08-12 DIAGNOSIS — M545 Low back pain: Secondary | ICD-10-CM | POA: Diagnosis not present

## 2016-08-12 DIAGNOSIS — E7800 Pure hypercholesterolemia, unspecified: Secondary | ICD-10-CM | POA: Diagnosis not present

## 2016-08-12 HISTORY — PX: A-FLUTTER ABLATION: EP1230

## 2016-08-12 SURGERY — A-FLUTTER ABLATION

## 2016-08-12 MED ORDER — HEPARIN (PORCINE) IN NACL 2-0.9 UNIT/ML-% IJ SOLN
INTRAMUSCULAR | Status: AC
  Filled 2016-08-12: qty 500

## 2016-08-12 MED ORDER — APIXABAN 5 MG PO TABS: 5.0000 mg | ORAL_TABLET | Freq: Two times a day (BID) | ORAL | Status: DC

## 2016-08-12 MED ORDER — FENTANYL CITRATE (PF) 100 MCG/2ML IJ SOLN
INTRAMUSCULAR | Status: AC
  Filled 2016-08-12: qty 2

## 2016-08-12 MED ORDER — FENTANYL CITRATE (PF) 100 MCG/2ML IJ SOLN
INTRAMUSCULAR | Status: DC | PRN
  Administered 2016-08-12: 25 ug via INTRAVENOUS
  Administered 2016-08-12: 12.5 ug via INTRAVENOUS
  Administered 2016-08-12: 25 ug via INTRAVENOUS

## 2016-08-12 MED ORDER — ONDANSETRON HCL 4 MG/2ML IJ SOLN: 4.0000 mg | Freq: Four times a day (QID) | INTRAMUSCULAR | Status: DC | PRN

## 2016-08-12 MED ORDER — MIDAZOLAM HCL 5 MG/5ML IJ SOLN
INTRAMUSCULAR | Status: DC | PRN
  Administered 2016-08-12 (×2): 1 mg via INTRAVENOUS
  Administered 2016-08-12: 2 mg via INTRAVENOUS
  Administered 2016-08-12 (×2): 1 mg via INTRAVENOUS

## 2016-08-12 MED ORDER — FERROUS SULFATE 325 (65 FE) MG PO TABS: ORAL_TABLET | Freq: Every day | ORAL | Status: DC

## 2016-08-12 MED ORDER — SODIUM CHLORIDE 0.9% FLUSH: 3.0000 mL | INTRAVENOUS | Status: DC | PRN

## 2016-08-12 MED ORDER — B COMPLEX-C PO TABS: 1.0000 | ORAL_TABLET | Freq: Every day | ORAL | Status: DC

## 2016-08-12 MED ORDER — BUPIVACAINE HCL (PF) 0.25 % IJ SOLN
INTRAMUSCULAR | Status: DC | PRN
  Administered 2016-08-12: 20 mL

## 2016-08-12 MED ORDER — MAGNESIUM 200 MG PO TABS
200.0000 mg | ORAL_TABLET | Freq: Two times a day (BID) | ORAL | Status: DC
  Filled 2016-08-12 (×2): qty 1

## 2016-08-12 MED ORDER — AMIODARONE HCL 200 MG PO TABS
200.0000 mg | ORAL_TABLET | Freq: Every day | ORAL | Status: DC
Start: 2016-08-12 — End: 2016-08-13

## 2016-08-12 MED ORDER — GLUCOSAMINE HCL 1000 MG PO TABS: 1.0000 | ORAL_TABLET | Freq: Two times a day (BID) | ORAL | Status: DC

## 2016-08-12 MED ORDER — MIDAZOLAM HCL 5 MG/5ML IJ SOLN
INTRAMUSCULAR | Status: AC
  Filled 2016-08-12: qty 5

## 2016-08-12 MED ORDER — ACETAMINOPHEN 325 MG PO TABS: 650.0000 mg | ORAL_TABLET | ORAL | Status: DC | PRN

## 2016-08-12 MED ORDER — SODIUM CHLORIDE 0.9 % IV SOLN: 250.0000 mL | INTRAVENOUS | Status: DC | PRN

## 2016-08-12 MED ORDER — PRAVASTATIN SODIUM 40 MG PO TABS: 40.0000 mg | ORAL_TABLET | Freq: Every day | ORAL | Status: DC

## 2016-08-12 MED ORDER — HEPARIN (PORCINE) IN NACL 2-0.9 UNIT/ML-% IJ SOLN
INTRAMUSCULAR | Status: DC | PRN
  Administered 2016-08-12: 500 mL

## 2016-08-12 MED ORDER — NITROGLYCERIN 0.4 MG SL SUBL: 0.4000 mg | SUBLINGUAL_TABLET | SUBLINGUAL | Status: DC | PRN

## 2016-08-12 MED ORDER — POTASSIUM 99 MG PO TABS: 1.0000 | ORAL_TABLET | Freq: Every day | ORAL | Status: DC

## 2016-08-12 MED ORDER — SODIUM CHLORIDE 0.9% FLUSH
3.0000 mL | Freq: Two times a day (BID) | INTRAVENOUS | Status: DC
Start: 2016-08-12 — End: 2016-08-13

## 2016-08-12 MED ORDER — SODIUM CHLORIDE 0.9 % IV SOLN
INTRAVENOUS | Status: DC
  Administered 2016-08-12: 10:00:00 via INTRAVENOUS

## 2016-08-12 MED ORDER — BUPIVACAINE HCL (PF) 0.25 % IJ SOLN
INTRAMUSCULAR | Status: AC
  Filled 2016-08-12: qty 30

## 2016-08-12 MED ORDER — METOPROLOL SUCCINATE ER 25 MG PO TB24: 25.0000 mg | ORAL_TABLET | Freq: Every day | ORAL | Status: DC

## 2016-08-12 MED ORDER — ALBUTEROL SULFATE (2.5 MG/3ML) 0.083% IN NEBU: 2.5000 mg | INHALATION_SOLUTION | Freq: Four times a day (QID) | RESPIRATORY_TRACT | Status: DC | PRN

## 2016-08-12 SURGICAL SUPPLY — 13 items
BAG SNAP BAND KOVER 36X36 (MISCELLANEOUS) ×2 IMPLANT
CATH BLAZERPRIME XP (ABLATOR) ×2 IMPLANT
CATH DUODECA/ISMUS 7FR REPROC (CATHETERS) ×2 IMPLANT
CATH JOSEPH QUAD ALLRED 6F REP (CATHETERS) ×2 IMPLANT
CATH POLARIS X 2.5/5/2.5 DECAP (CATHETERS) ×2 IMPLANT
PACK EP LATEX FREE (CUSTOM PROCEDURE TRAY) ×3
PACK EP LF (CUSTOM PROCEDURE TRAY) IMPLANT
PAD DEFIB LIFELINK (PAD) ×2 IMPLANT
PATCH CARTO3 (PAD) ×3 IMPLANT
SHEATH PINNACLE 6F 10CM (SHEATH) ×2 IMPLANT
SHEATH PINNACLE 7F 10CM (SHEATH) ×3 IMPLANT
SHEATH PINNACLE 8F 10CM (SHEATH) ×4 IMPLANT
SHIELD RADPAD SCOOP 12X17 (MISCELLANEOUS) ×2 IMPLANT

## 2016-08-12 NOTE — Progress Notes (Signed)
Pt dangled and ambulated 360 ft with RN. Patient tolerated ambulation well. Procedural site clean and dry. No signs of hematoma/bleeding. Recent BP 101/88. HR 81. O2 sats 96% on RA. Pt IV removed and received discharge education. Pt understood all instructions and had no further questions.

## 2016-08-12 NOTE — Progress Notes (Signed)
Site area: Right groin a 6, and 8 french X2 was removed  Site Prior to Removal:  Level 0  Pressure Applied For 15 MINUTES    [place] Beginning at 1330p  Manual:   Yes.    Patient Status During Pull:  stable  Post Pull Groin Site:  Level 0  Post Pull Instructions Given:  Yes.    Post Pull Pulses Present:  Yes.    Dressing Applied:  Yes.    Comments:  VS remain stable during sheath pull.

## 2016-08-12 NOTE — Progress Notes (Signed)
Patient is seen post procedure VSS R groin/procedure site is soft, no bleeding, no hematoma No change to home meds Activity restrictions and site care are discussed Follow up has been arranged Telemetry is SR Plan for discharge once bedrest is completed, after he has ambulated and procedure site remain stable  Tommye Standard, PA-C  EP Attending  Patient seen and examined. Agree with above. If his groin is ok after ambulation, then he can go home with usual followup. No change in meds.  Mikle Bosworth.D.

## 2016-08-12 NOTE — Progress Notes (Signed)
PHARMACIST - PHYSICIAN ORDER COMMUNICATION  CONCERNING: P&T Medication Policy on Herbal Medications  DESCRIPTION:  This patient's order for:  Glucosamine and potassium tab 99 mg (only equals 2.5 meq KCL)  has been noted.  This product(s) is classified as an "herbal" or natural product. Due to a lack of definitive safety studies or FDA approval, nonstandard manufacturing practices, plus the potential risk of unknown drug-drug interactions while on inpatient medications, the Pharmacy and Therapeutics Committee does not permit the use of "herbal" or natural products of this type within [facility].   ACTION TAKEN: The pharmacy department is unable to verify this order at this time and your patient has been informed of this safety policy. Please reevaluate patient's clinical condition at discharge and address if the herbal or natural product(s) should be resumed at that time.  Thanks!  Maryanna Shape, PharmD, [place], [phone]

## 2016-08-12 NOTE — H&P (View-Only) (Signed)
HPI [name] returns today for ongoing followup of multiple atrial arrhythmias. He is a pleasant 76 yo man with PAF, flutter and atrial tachycardia. He has been started on amiodarone and has returned to NSR. Since I saw him last he has redeveloped atrial flutter. He feels fatigue and weakness. He has a very slow atrial flutter cycle length and has had mostly 2:1 AV conduction.  Allergies  Allergen Reactions  . Brilinta [Ticagrelor] Itching and Rash  . Penicillins Rash    Has patient had a PCN reaction causing immediate rash, facial/tongue/throat swelling, SOB or lightheadedness with hypotension: Yes Has patient had a PCN reaction causing severe rash involving mucus membranes or skin necrosis: Yes Has patient had a PCN reaction that required hospitalization No Has patient had a PCN reaction occurring within the last 10 years: No If all of the above answers are "NO", then may proceed with Cephalosporin use.      Current Outpatient Prescriptions  Medication Sig Dispense Refill  . albuterol (PROVENTIL HFA;VENTOLIN HFA) 108 (90 Base) MCG/ACT inhaler Inhale 1-2 puffs into the lungs every 6 (six) hours as needed for wheezing or shortness of breath. 1 Inhaler 0  . amiodarone (PACERONE) 200 MG tablet Take 1 tablet (200 mg total) by mouth daily. 180 tablet 3  . apixaban (ELIQUIS) 5 MG TABS tablet Take 5 mg by mouth 2 (two) times daily.    . Ascorbic Acid (VITAMIN C) 500 MG tablet Take 500 mg by mouth 2 (two) times daily.      Marland Kitchen b complex vitamins tablet Take 1 tablet by mouth daily.    . Cholecalciferol (VITAMIN D3 PO) Take 1 capsule by mouth daily.    . clotrimazole (LOTRIMIN) 1 % cream Apply to affected area 2 times daily 15 g 0  . Coenzyme Q10 (EQL COQ10) 300 MG CAPS Take 1 capsule by mouth every morning.     . Ferrous Sulfate (IRON) 325 (65 Fe) MG TABS Take 1 tablet by mouth daily.    . Glucosamine HCl 1000 MG TABS Take 1 tablet by mouth 2 (two) times daily.    Marland Kitchen HAWTHORN BERRY  PO Take 1 capsule by mouth every morning.     Marland Kitchen ipratropium (ATROVENT) 0.06 % nasal spray 2 sprays into each nostril 4 times a day as needed for runny nose. 15 mL 12  . Magnesium 250 MG TABS Take 250 mg by mouth 2 (two) times daily.     . metoprolol succinate (TOPROL-XL) 25 MG 24 hr tablet Take 1 tablet (25 mg total) by mouth daily. 30 tablet 0  . Multiple Vitamin (MULTIVITAMIN WITH MINERALS) TABS Take 1 tablet by mouth every morning.     . nitroGLYCERIN (NITROSTAT) 0.4 MG SL tablet Place 0.4 mg under the tongue every 5 (five) minutes x 3 doses as needed for chest pain (Call 911 at 3rd dose within 15 minutes.).    Marland Kitchen OVER THE COUNTER MEDICATION Take 1,000 mg by mouth every morning. BILLBERRY CAPSULE 1000MG     . Potassium (POTASSIMIN PO) Take 1 tablet by mouth daily.    . pravastatin (PRAVACHOL) 40 MG tablet Take 40 mg by mouth at bedtime.     . ranitidine (ZANTAC) 75 MG tablet Take 75 mg by mouth 2 (two) times daily.    . saw palmetto 160 MG capsule Take 160 mg by mouth 2 (two) times daily.     . vitamin E 400 UNIT capsule Take 400 Units by mouth daily.     Marland Kitchen  VITAMIN K PO Take 1 capsule by mouth daily.     No current facility-administered medications for this visit.      Past Medical History:  Diagnosis Date  . Arthritis   . Chronic lower back pain    "last 3 months; usually when I bend" (12/24/2011)  . Coronary artery disease   . Endocarditis   . GERD (gastroesophageal reflux disease)   . Heart murmur   . High cholesterol   . Hypertension   . Migraines    "none in the last couple years" (12/24/2011)  . Myocardial infarction [facility])    POSSIBLE 2012 ELEVATED ENZYMES  . Pneumonia ~ 2011    ROS:   All systems reviewed and negative except as noted in the HPI.   Past Surgical History:  Procedure Laterality Date  . CARDIOVERSION N/A 10/29/2015   Procedure: CARDIOVERSION;  Surgeon: Adrian Prows, MD;  Location: [place];  Service: Cardiovascular;  Laterality: N/A;  . COLON RESECTION   12/25/2011   Procedure: COLON RESECTION LAPAROSCOPIC;  Surgeon: Shann Medal, MD;  Location: [place];  Service: General;  Laterality: Right;  laparoscopic assisted right colon resection  . COLONOSCOPY  11/11/2011   Procedure: COLONOSCOPY;  Surgeon: Missy Sabins, MD;  Location: [place];  Service: Endoscopy;  Laterality: N/A;  . CORONARY ANGIOPLASTY WITH STENT PLACEMENT  11/11/2010   "1"  . CYSTECTOMY  ~ 1980   right eyelid  . TEE WITHOUT CARDIOVERSION  11/06/2011   Procedure: TRANSESOPHAGEAL ECHOCARDIOGRAM (TEE);  Surgeon: Laverda Page, MD;  Location[facility] ENDOSCOPY;  Service: Cardiovascular;  Laterality: N/A;     Family History  Problem Relation Age of Onset  . Hypertension Mother   . Heart failure Father   . Cancer Neg Hx   . Diabetes Neg Hx   . Hyperlipidemia Neg Hx   . Sudden death Neg Hx   . Stroke Neg Hx   . Heart attack Neg Hx      Social History   Social History  . Marital status: Married    Spouse name: N/A  . Number of children: N/A  . Years of education: N/A   Occupational History  . Not on file.   Social History Main Topics  . Smoking status: Never Smoker  . Smokeless tobacco: Never Used     Comment: 12/24/2011 "used to puff cigarettes; never inhaled"  . Alcohol use No  . Drug use: No  . Sexual activity: No   Other Topics Concern  . Not on file   Social History Narrative  . No narrative on file     BP 102/66 (BP Location: Left Arm)   Pulse 78   Ht 6' (1.829 m)   Wt 190 lb 9.6 oz (86.5 kg)   BMI 25.85 kg/m   Physical Exam:  Well appearing 76 yo man, NAD HEENT: Unremarkable Neck:  6 cm JVD, no thyromegally Lymphatics:  No adenopathy Back:  No CVA tenderness Lungs:  Clear with no wheezes HEART:  Regular rate rhythm, no murmurs, no rubs, no clicks Abd:  soft, positive bowel sounds, no organomegally, no rebound, no guarding Ext:  2 plus pulses, no edema, no cyanosis, no clubbing Skin:  No rashes no nodules Neuro:  CN II through XII intact,  motor grossly intact  EKG - atrial flutter with 2:1 AV conduction with LBBB   Assess/Plan: 1. PAF/SVT/Atrial flutter - His atrial flutter with 2:1 AV conduction has returned. I have discussed the treatment options with the patient and  have recommended proceeding with EP study and catheter ablation of atrial flutter. 2. CAD - he denies anginal symptoms. 3. HTN - his blood pressure is controlled. Will reassess once he is back in NSR. 4. Coags - he will continue his Eliquis for now.  Mikle Bosworth.D.

## 2016-08-12 NOTE — Interval H&P Note (Signed)
History and Physical Interval Note:  08/12/2016 9:28 AM  [name]  has presented today for surgery, with the diagnosis of flutter  The various methods of treatment have been discussed with the patient and family. After consideration of risks, benefits and other options for treatment, the patient has consented to  Procedure(s): A-Flutter Ablation (N/A) as a surgical intervention .  The patient's history has been reviewed, patient examined, no change in status, stable for surgery.  I have reviewed the patient's chart and labs.  Questions were answered to the patient's satisfaction.     [name]

## 2016-08-12 NOTE — Discharge Instructions (Signed)
No driving for 4 days. No lifting over 5 lbs for 1 week. No vigorous or sexual activity for 1 week. You may return to work on 08/19/16. Keep procedure site clean & dry. If you notice increased pain, swelling, bleeding or pus, call/return!  You may shower, but no soaking baths/hot tubs/pools for 1 week.

## 2016-08-13 MED FILL — Bupivacaine HCl Preservative Free (PF) Inj 0.25%: INTRAMUSCULAR | Qty: 30 | Status: AC

## 2016-08-17 ENCOUNTER — Ambulatory Visit (HOSPITAL_COMMUNITY): Payer: Medicare Other | Admitting: Nurse Practitioner

## 2016-08-31 ENCOUNTER — Encounter (HOSPITAL_COMMUNITY): Payer: Self-pay | Admitting: *Deleted

## 2016-08-31 ENCOUNTER — Ambulatory Visit (HOSPITAL_COMMUNITY)
Admission: EM | Admit: 2016-08-31 | Discharge: 2016-08-31 | Disposition: A | Discharge status: Nursing Home (Includes ICF) | Payer: Medicare Other

## 2016-08-31 ENCOUNTER — Other Ambulatory Visit: Payer: Self-pay

## 2016-08-31 ENCOUNTER — Emergency Department (HOSPITAL_COMMUNITY)
Admission: EM | Admit: 2016-08-31 | Discharge: 2016-08-31 | Disposition: A | Discharge status: Home / Self Care | Payer: Medicare Other | Attending: Emergency Medicine | Admitting: Emergency Medicine

## 2016-08-31 ENCOUNTER — Emergency Department (HOSPITAL_COMMUNITY): Payer: Medicare Other

## 2016-08-31 DIAGNOSIS — R911 Solitary pulmonary nodule: Secondary | ICD-10-CM | POA: Diagnosis not present

## 2016-08-31 DIAGNOSIS — Z7901 Long term (current) use of anticoagulants: Secondary | ICD-10-CM | POA: Diagnosis not present

## 2016-08-31 DIAGNOSIS — Z79899 Other long term (current) drug therapy: Secondary | ICD-10-CM | POA: Insufficient documentation

## 2016-08-31 DIAGNOSIS — I252 Old myocardial infarction: Secondary | ICD-10-CM | POA: Diagnosis not present

## 2016-08-31 DIAGNOSIS — I4891 Unspecified atrial fibrillation: Secondary | ICD-10-CM | POA: Insufficient documentation

## 2016-08-31 DIAGNOSIS — I10 Essential (primary) hypertension: Secondary | ICD-10-CM | POA: Insufficient documentation

## 2016-08-31 DIAGNOSIS — I480 Paroxysmal atrial fibrillation: Secondary | ICD-10-CM

## 2016-08-31 DIAGNOSIS — Z955 Presence of coronary angioplasty implant and graft: Secondary | ICD-10-CM | POA: Diagnosis not present

## 2016-08-31 DIAGNOSIS — R0602 Shortness of breath: Secondary | ICD-10-CM | POA: Diagnosis present

## 2016-08-31 HISTORY — DX: Anemia, unspecified: D64.9

## 2016-08-31 LAB — CBC
HCT: 35.2 % — ABNORMAL LOW (ref 39.0–52.0)
Hemoglobin: 11 g/dL — ABNORMAL LOW (ref 13.0–17.0)
MCH: 28.4 pg (ref 26.0–34.0)
MCHC: 31.3 g/dL (ref 30.0–36.0)
MCV: 91 fL (ref 78.0–100.0)
Platelets: 166 K/uL (ref 150–400)
RBC: 3.87 MIL/uL — ABNORMAL LOW (ref 4.22–5.81)
RDW: 17.7 % — ABNORMAL HIGH (ref 11.5–15.5)
WBC: 6 K/uL (ref 4.0–10.5)

## 2016-08-31 LAB — BASIC METABOLIC PANEL WITH GFR
Anion gap: 8 (ref 5–15)
BUN: 16 mg/dL (ref 6–20)
CO2: 24 mmol/L (ref 22–32)
Calcium: 9.1 mg/dL (ref 8.9–10.3)
Chloride: 106 mmol/L (ref 101–111)
Creatinine, Ser: 1.49 mg/dL — ABNORMAL HIGH (ref 0.61–1.24)
GFR calc Af Amer: 52 mL/min — ABNORMAL LOW
GFR calc non Af Amer: 44 mL/min — ABNORMAL LOW
Glucose, Bld: 122 mg/dL — ABNORMAL HIGH (ref 65–99)
Potassium: 4.3 mmol/L (ref 3.5–5.1)
Sodium: 138 mmol/L (ref 135–145)

## 2016-08-31 LAB — I-STAT TROPONIN, ED: Troponin i, poc: 0.02 ng/mL (ref 0.00–0.08)

## 2016-08-31 NOTE — ED Provider Notes (Signed)
[name]   CSN: [id number] Arrival date & time: 08/31/16  1231   By signing my name below, I, [name], attest that this documentation has been prepared under the direction and in the presence of Virgel Manifold, MD . Electronically Signed: Evelene [name], Scribe. 08/31/2016. 3:14 PM.  History   Chief Complaint Chief Complaint  Patient presents with  . Palpitations  . Shortness of Breath    The history is provided by the patient. No language interpreter was used.     HPI Comments:  [name] is a 76 y.o. male with a history of HTN, CA, AFIB, and SVT, who presents to the Emergency Department complaining of intermittent episodes of palpitations/tachycardia X ~ 1 week. Each episode lasts  a few hours and then resolves. Today's episode began yesterday and has been constant since.  He reports associated SOB and fatigue/generalized weakness . Pt has a h/o AFIB. He underwent an ablation on 08/12/2016 and was fine until 1 week ago. He states prior to having the ablation he had similar episodes of generalized weakness with palpitations. He [name] she has needed cardioversion in the past which has provided temporary relief.  Pt is currently on Eliquis and is complaint with dose.    Past Medical History:  Diagnosis Date  . Anemia   . Arthritis   . Chronic lower back pain    "last 3 months; usually when I bend" (12/24/2011)  . Coronary artery disease   . Endocarditis   . GERD (gastroesophageal reflux disease)   . Heart murmur   . High cholesterol   . Hypertension   . Migraines    "none in the last couple years" (12/24/2011)  . Myocardial infarction [facility])    POSSIBLE 2012 ELEVATED ENZYMES  . Pneumonia ~ 2011    Patient Active Problem List   Diagnosis Date Noted  . Typical atrial flutter ([place]) 08/12/2016  . Atrial flutter ([place]) 08/12/2016  . Hypotension 05/06/2016  . Atrial fibrillation with RVR ([place]) 05/06/2016  . AKI (acute kidney injury) ([place])  05/06/2016  . SVT (supraventricular tachycardia) ([place]) 01/31/2015  . Colonic mass, right colon. 11/10/2011  . Acute bacterial endocarditis 11/06/2011  . HTN (hypertension) 11/06/2011  . BPH (benign prostatic hyperplasia) 11/06/2011  . 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 Back pain 11/06/2011  . Hyperlipidemia 11/06/2011  . Acute renal insufficiency 08/25/2010  . Viridans streptococci infection 08/25/2010  . Streptococcus infection 08/25/2010  . Tooth infection 08/25/2010  . Status post PICC central line placement 08/25/2010  . Arm pain, right 08/25/2010  . ENDOCARDITIS, BACTERIAL, SUBACUTE 05/07/2010  . Iron deficiency anemia 05/06/2010  . Hypertrophic obstructive cardiomyopathy ([place]) 05/06/2010  . SYNCOPE 05/06/2010  . DYSPNEA ON EXERTION 05/06/2010  . ECHOCARDIOGRAM, ABNORMAL 05/06/2010  . HEART MURMUR, HX OF 05/06/2010    Past Surgical History:  Procedure Laterality Date  . A-FLUTTER ABLATION N/A 08/12/2016   Procedure: A-Flutter Ablation;  Surgeon: Evans Lance, MD;  Location: [place] CV LAB;  Service: Cardiovascular;  Laterality: N/A;  . CARDIOVERSION N/A 10/29/2015   Procedure: CARDIOVERSION;  Surgeon: Adrian Prows, MD;  Location: [facility] ENDOSCOPY;  Service: Cardiovascular;  Laterality: N/A;  . COLON RESECTION  12/25/2011   Procedure: COLON RESECTION LAPAROSCOPIC;  Surgeon: Shann Medal, MD;  Location: [place];  Service: General;  Laterality: Right;  laparoscopic assisted right colon resection  . COLONOSCOPY  11/11/2011   Procedure: COLONOSCOPY;  Surgeon: Missy Sabins, MD;  Location: [place];  Service: Endoscopy;  Laterality:  N/A;  . CORONARY ANGIOPLASTY WITH STENT PLACEMENT  11/11/2010   "1"  . CYSTECTOMY  ~ 1980   right eyelid  . TEE WITHOUT CARDIOVERSION  11/06/2011   Procedure: TRANSESOPHAGEAL ECHOCARDIOGRAM (TEE);  Surgeon: Laverda Page, MD;  Location: [place];  Service: Cardiovascular;  Laterality: N/A;       Home Medications    Prior to Admission  medications   Medication Sig Start Date End Date Taking? Authorizing Provider  albuterol (PROVENTIL HFA;VENTOLIN HFA) 108 (90 Base) MCG/ACT inhaler Inhale 1-2 puffs into the lungs every 6 (six) hours as needed for wheezing or shortness of breath. 07/07/16   Janne Napoleon, NP  amiodarone (PACERONE) 200 MG tablet Take 1 tablet (200 mg total) by mouth daily. 02/05/16   Evans Lance, MD  apixaban (ELIQUIS) 5 MG TABS tablet Take 5 mg by mouth 2 (two) times daily.    [provider]  Ascorbic Acid (VITAMIN C) 500 MG tablet Take 500 mg by mouth 2 (two) times daily.      [provider]  b complex vitamins tablet Take 1 tablet by mouth daily.    [provider]  Bilberry 1000 MG CAPS Take 1 capsule by mouth daily.    [provider]  Cholecalciferol (VITAMIN D3 PO) Take 1 capsule by mouth daily.    [provider]  clotrimazole (LOTRIMIN) 1 % cream Apply to affected area 2 times daily Patient taking differently: Apply 1 application topically 2 (two) times daily as needed.  05/25/16   Sherlene Shams, MD  Coenzyme Q10 (EQL COQ10) 300 MG CAPS Take 1 capsule by mouth every morning.     [provider]  Ferrous Sulfate (IRON) 325 (65 Fe) MG TABS Take 1 tablet by mouth daily.    [provider]  Glucosamine HCl 1000 MG TABS Take 1 tablet by mouth 2 (two) times daily.    [provider]  HAWTHORN BERRY PO Take 1 capsule by mouth every morning.     [provider]  HORSE CHESTNUT PO Take 1 capsule by mouth daily.    [provider]  ipratropium (ATROVENT) 0.06 % nasal spray 2 sprays into each nostril 4 times a day as needed for runny nose. 07/07/16   Janne Napoleon, NP  Magnesium 250 MG TABS Take 250 mg by mouth 2 (two) times daily.     [provider]  metoprolol succinate (TOPROL-XL) 25 MG 24 hr tablet Take 1 tablet (25 mg total) by mouth daily. 05/08/16   Geradine Girt, DO  Multiple Vitamin (MULTIVITAMIN WITH MINERALS)  TABS Take 1 tablet by mouth every morning.     [provider]  nitroGLYCERIN (NITROSTAT) 0.4 MG SL tablet Place 0.4 mg under the tongue every 5 (five) minutes x 3 doses as needed for chest pain (Call 911 at 3rd dose within 15 minutes.).    [provider]  Omega-3 Fatty Acids (FISH OIL) 1000 MG CAPS Take 1 capsule by mouth 2 (two) times daily.    [provider]  Potassium 99 MG TABS Take 1 tablet by mouth daily.    [provider]  pravastatin (PRAVACHOL) 40 MG tablet Take 40 mg by mouth at bedtime.     [provider]  ranitidine (ZANTAC) 75 MG tablet Take 75 mg by mouth 2 (two) times daily.    [provider]  saw palmetto 160 MG capsule Take 160 mg by mouth 2 (two) times daily.  [provider]  vitamin E 400 UNIT capsule Take 400 Units by mouth daily.     [provider]  VITAMIN K PO Take 1 capsule by mouth daily.    [provider]    Family History Family History  Problem Relation Age of Onset  . Hypertension Mother   . Heart failure Father   . Cancer Neg Hx   . Diabetes Neg Hx   . Hyperlipidemia Neg Hx   . Sudden death Neg Hx   . Stroke Neg Hx   . Heart attack Neg Hx     Social History Social History  Substance Use Topics  . Smoking status: Never Smoker  . Smokeless tobacco: Never Used     Comment: 12/24/2011 "used to puff cigarettes; never inhaled"  . Alcohol use No     Allergies   Brilinta [ticagrelor] and Penicillins   Review of Systems Review of Systems  Constitutional: Positive for fatigue.  Respiratory: Positive for shortness of breath.   Cardiovascular: Positive for palpitations.  Neurological: Positive for weakness (generalized).  All other systems reviewed and are negative.    Physical Exam Updated Vital Signs BP 110/63   Pulse 93   Temp 97.8 F (36.6 C) (Oral)   Resp 14   Ht 6' (1.829 m)   Wt 185 lb (83.9 kg)   SpO2 97%   BMI 25.09 kg/m   Physical Exam    Constitutional: He is oriented to person, place, and time. He appears well-developed and well-nourished.  HENT:  Head: Normocephalic and atraumatic.  Eyes: EOM are normal.  Neck: Normal range of motion.  Cardiovascular: Normal rate and intact distal pulses.  An irregularly irregular rhythm present.  Murmur heard.  Systolic murmur is present  Pulmonary/Chest: Effort normal and breath sounds normal. No respiratory distress.  Abdominal: Soft. He exhibits no distension. There is no tenderness.  Musculoskeletal: Normal range of motion.  Neurological: He is alert and oriented to person, place, and time.  Skin: Skin is warm and dry.  Psychiatric: He has a normal mood and affect. Judgment normal.  Nursing [name] and vitals reviewed.    ED Treatments / Results   DIAGNOSTIC STUDIES:  Oxygen Saturation is 97% on RA, normal by my interpretation.    COORDINATION OF CARE:  3:05 PM Discussed treatment plan with pt at bedside and pt agreed to plan.  Labs (all labs ordered are listed, but only abnormal results are displayed) Labs Reviewed  BASIC METABOLIC PANEL - Abnormal; Notable for the following:       Result Value   Glucose, Bld 122 (*)    Creatinine, Ser 1.49 (*)    GFR calc non Af Amer 44 (*)    GFR calc Af Amer 52 (*)    All other components within normal limits  CBC - Abnormal; Notable for the following:    RBC 3.87 (*)    Hemoglobin 11.0 (*)    HCT 35.2 (*)    RDW 17.7 (*)    All other components within normal limits  I-STAT TROPOININ, ED    EKG  EKG Interpretation  Date/Time:  Monday Aug 31 2016 12:39:47 EDT Ventricular Rate:  78 PR Interval:    QRS Duration: 162 QT Interval:  442 QTC Calculation: 503 R Axis:   8 Text Interpretation:  Atrial fibrillation Left bundle branch block Abnormal ECG Confirmed by Wilson Singer  MD, Annie Main ([id number]) on 08/31/2016 3:13:27 PM       Radiology Dg Chest 2 View  Result Date: 08/31/2016 CLINICAL DATA:  Cardiac ablation Aug 12, 2016.  Patient Knott feeling well with increasing palpitations. EXAM: CHEST  2 VIEW COMPARISON:  None. FINDINGS: No pneumothorax. The heart size borderline. The hila and mediastinum are normal. Minimal atelectasis in the left base. There is a tiny nodule in the left upper lobe laterally and another in the right costophrenic angle, most likely small granulomas. No new nodules or masses. No focal infiltrate to suggest pneumonia. No overt edema. IMPRESSION: No acute abnormality. Electronically Signed   By: Dorise Bullion III M.D   On: 08/31/2016 13:21    Procedures Procedures (including critical care time)  Medications Ordered in ED Medications - No data to display   Initial Impression / Assessment and Plan / ED Course  I have reviewed the triage vital signs and the nursing notes.  Pertinent labs & imaging results that were available during my care of the patient were reviewed by me and considered in my medical decision making (see chart for details).     76yM with re occurence of afib after ablation. Has been anticoagulated since. Rate controlled. Discussed with cards. Recommending not trying to cardiovert at this time. No med changes. FU with EP.   Final Clinical Impressions(s) / ED Diagnoses   Final diagnoses:  Atrial fibrillation, unspecified type [facility])    New Prescriptions New Prescriptions   No medications on file   I personally preformed the services scribed in my presence. The recorded information has been reviewed is accurate. Virgel Manifold, MD.     Virgel Manifold, MD 09/06/16 2005

## 2016-08-31 NOTE — ED Triage Notes (Addendum)
Pt states cardiac ablation on 08/12/16.  States since then the has been feeling well.  Pt states 4 days ago pt started noticing increased palpitations and tachycardic episodes.  Today they are more frequent and he is experiencing sob.  Denies chest pain or pressure, though feels "need to burp".  PT has not taken his cardiac meds today b/c they are due at 12.

## 2016-08-31 NOTE — ED Notes (Signed)
Got patient hooked up to the monitor patient is resting

## 2016-08-31 NOTE — Progress Notes (Signed)
Called regarding [name] - patient of Dr. [name] - has been fatigued recently, had a-fib ablation on 08/12/2016 - successfully. Maintained on Eliquis for anticoagulation. Now presented to ER with irregular HR - found to be back in a-fib, probably representing ERAF. Per ER doc, essentially asymptomatic at rest. Rate-controlled. I would recommend continuing anticoagulation and rate-control and advise the patient to follow-up with Dr. [name] or with Roderic Palau, NP in the a-fib clinic this week.  Pixie Casino, MD, [place]  Attending Cardiologist  Direct Dial: [phone]  Fax: [phone]  Website:  [email or web address]

## 2016-08-31 NOTE — ED Notes (Addendum)
Physician approved to take meds amiodrone and metroprolol

## 2016-08-31 NOTE — ED Notes (Signed)
PT states understanding of care given, follow up care. PT ambulated from ED to car with a steady gait.  

## 2016-09-01 ENCOUNTER — Telehealth: Payer: Self-pay | Admitting: Internal Medicine

## 2016-09-01 ENCOUNTER — Telehealth (HOSPITAL_COMMUNITY): Payer: Self-pay | Admitting: *Deleted

## 2016-09-01 NOTE — Telephone Encounter (Signed)
New Message   pt verbalized thatshe is calling for rn because he needs to confirm dates   of ablation and of to see if Dr.Taylor wants him to keep the 09-11-2016 appt

## 2016-09-01 NOTE — Telephone Encounter (Signed)
PT on ED afib f/u list but ED note expresses pt stable and has follow up with Crissie Sickles, MD on 09/11/16

## 2016-09-02 MED ORDER — METOPROLOL SUCCINATE ER 25 MG PO TB24
25.0000 mg | ORAL_TABLET | Freq: Two times a day (BID) | ORAL | 3 refills | Status: DC
Start: 2016-09-02 — End: 2016-10-23

## 2016-09-02 NOTE — Telephone Encounter (Signed)
Called, spoke with pt. Pt stated he has had several episodes of a-fib/flutter which started on 08/26/16. Reviewed Dr. Tanna Furry recommendation. Dr. [name] recommended pt to increase Toprol Xl to 25 mg twice a day. Informed pt is symptoms get worse, or any questions or concerns, to please call our office prior to appt on 09/11/16. Pt verbalized understanding.

## 2016-09-07 ENCOUNTER — Telehealth: Payer: Self-pay | Admitting: Internal Medicine

## 2016-09-07 NOTE — Telephone Encounter (Signed)
Called, spoke with pt. Inform Dr. [name] recommended pt continue on with current medications until f/u appt 09/11/16. Informed to call our office if pt developes symptoms prior to appt. Pt verbalized understanding.

## 2016-09-07 NOTE — Telephone Encounter (Signed)
New Message     Pulse rate is still high, is there something else he should try. Pulse is 112

## 2016-09-09 ENCOUNTER — Encounter: Payer: Self-pay | Admitting: Internal Medicine

## 2016-09-09 DIAGNOSIS — H40002 Preglaucoma, unspecified, left eye: Secondary | ICD-10-CM | POA: Diagnosis not present

## 2016-09-09 DIAGNOSIS — H11153 Pinguecula, bilateral: Secondary | ICD-10-CM | POA: Diagnosis not present

## 2016-09-09 DIAGNOSIS — Z79899 Other long term (current) drug therapy: Secondary | ICD-10-CM | POA: Diagnosis not present

## 2016-09-09 DIAGNOSIS — H25013 Cortical age-related cataract, bilateral: Secondary | ICD-10-CM | POA: Diagnosis not present

## 2016-09-09 DIAGNOSIS — H18413 Arcus senilis, bilateral: Secondary | ICD-10-CM | POA: Diagnosis not present

## 2016-09-09 DIAGNOSIS — H04123 Dry eye syndrome of bilateral lacrimal glands: Secondary | ICD-10-CM | POA: Diagnosis not present

## 2016-09-09 DIAGNOSIS — H2513 Age-related nuclear cataract, bilateral: Secondary | ICD-10-CM | POA: Diagnosis not present

## 2016-09-11 ENCOUNTER — Ambulatory Visit (INDEPENDENT_AMBULATORY_CARE_PROVIDER_SITE_OTHER): Payer: Medicare Other | Admitting: Internal Medicine

## 2016-09-11 ENCOUNTER — Encounter: Payer: Self-pay | Admitting: Internal Medicine

## 2016-09-11 VITALS — BP 134/82 | HR 110 | Ht 72.0 in | Wt 189.4 lb

## 2016-09-11 DIAGNOSIS — I10 Essential (primary) hypertension: Secondary | ICD-10-CM

## 2016-09-11 DIAGNOSIS — I421 Obstructive hypertrophic cardiomyopathy: Secondary | ICD-10-CM

## 2016-09-11 DIAGNOSIS — Z01812 Encounter for preprocedural laboratory examination: Secondary | ICD-10-CM | POA: Diagnosis not present

## 2016-09-11 DIAGNOSIS — I4892 Unspecified atrial flutter: Secondary | ICD-10-CM

## 2016-09-11 NOTE — Progress Notes (Signed)
HPI [name] returns today for followup. He is a pleasant 76 yo man with atrial flutter and LBBB, and HCM who underwent EP study and catheter ablation several weeks ago. He did well for about 2 weeks after the procedure but developed recurrent palpitations and was found to be back in atrial flutter. He has been on amiodarone. He had extensive scarring demonstrated in the RA during the last ablation. He had clockwise flutter. He is not as symptomatic as he was during the past. He notes fatigue and weakness however.  Allergies  Allergen Reactions  . Adhesive [Tape] Itching    The patches on back, when he had his ablasion  . Brilinta [Ticagrelor] Itching and Rash  . Penicillins Rash    Has patient had a PCN reaction causing immediate rash, facial/tongue/throat swelling, SOB or lightheadedness with hypotension: Yes Has patient had a PCN reaction causing severe rash involving mucus membranes or skin necrosis: Yes Has patient had a PCN reaction that required hospitalization No Has patient had a PCN reaction occurring within the last 10 years: No If all of the above answers are "NO", then may proceed with Cephalosporin use.      Current Outpatient Prescriptions  Medication Sig Dispense Refill  . albuterol (PROVENTIL HFA;VENTOLIN HFA) 108 (90 Base) MCG/ACT inhaler Inhale 1-2 puffs into the lungs every 6 (six) hours as needed for wheezing or shortness of breath. 1 Inhaler 0  . amiodarone (PACERONE) 200 MG tablet Take 1 tablet (200 mg total) by mouth daily. 180 tablet 3  . apixaban (ELIQUIS) 5 MG TABS tablet Take 5 mg by mouth 2 (two) times daily.    . Ascorbic Acid (VITAMIN C) 500 MG tablet Take 500 mg by mouth 2 (two) times daily.      Marland Kitchen b complex vitamins tablet Take 1 tablet by mouth daily.    . Bilberry 1000 MG CAPS Take 1 capsule by mouth daily.    . Cholecalciferol (VITAMIN D3 PO) Take 1 capsule by mouth daily.    . clotrimazole (LOTRIMIN) 1 % cream Apply 1 application topically  2 (two) times daily. Apply to affected area as directed    . Coenzyme Q10 (EQL COQ10) 300 MG CAPS Take 1 capsule by mouth every morning.     . Ferrous Sulfate (IRON) 325 (65 Fe) MG TABS Take 1 tablet by mouth daily.    . Glucosamine HCl 1000 MG TABS Take 1 tablet by mouth 2 (two) times daily.    Marland Kitchen HAWTHORN BERRY PO Take 1 capsule by mouth every morning.     Marland Kitchen HORSE CHESTNUT PO Take 1 capsule by mouth daily.    Marland Kitchen ipratropium (ATROVENT) 0.06 % nasal spray 2 sprays into each nostril 4 times a day as needed for runny nose. 15 mL 12  . metoprolol succinate (TOPROL XL) 25 MG 24 hr tablet Take 1 tablet (25 mg total) by mouth 2 (two) times daily. 60 tablet 3  . Multiple Vitamin (MULTIVITAMIN WITH MINERALS) TABS Take 1 tablet by mouth every morning.     . Omega-3 Fatty Acids (FISH OIL) 1000 MG CAPS Take 1 capsule by mouth daily.     Vladimir Faster Glycol-Propyl Glycol (SYSTANE) 0.4-0.3 % SOLN Place 1 drop into both eyes daily.    . pravastatin (PRAVACHOL) 40 MG tablet Take 40 mg by mouth at bedtime.     . ranitidine (ZANTAC) 75 MG tablet Take 75 mg by mouth 2 (two) times daily.    Marland Kitchen saw  palmetto 160 MG capsule Take 160 mg by mouth 2 (two) times daily.     . vitamin E 400 UNIT capsule Take 400 Units by mouth daily.     Marland Kitchen VITAMIN K PO Take 1 capsule by mouth daily.     No current facility-administered medications for this visit.      Past Medical History:  Diagnosis Date  . Anemia   . Arthritis   . Chronic lower back pain    "last 3 months; usually when I bend" (12/24/2011)  . Coronary artery disease   . Endocarditis   . GERD (gastroesophageal reflux disease)   . Heart murmur   . High cholesterol   . Hypertension   . Migraines    "none in the last couple years" (12/24/2011)  . Myocardial infarction [facility])    POSSIBLE 2012 ELEVATED ENZYMES  . Pneumonia ~ 2011    ROS:   All systems reviewed and negative except as noted in the HPI.   Past Surgical History:  Procedure Laterality Date  .  A-FLUTTER ABLATION N/A 08/12/2016   Procedure: A-Flutter Ablation;  Surgeon: Evans Lance, MD;  Location: [place] CV LAB;  Service: Cardiovascular;  Laterality: N/A;  . CARDIOVERSION N/A 10/29/2015   Procedure: CARDIOVERSION;  Surgeon: Adrian Prows, MD;  Location: [facility] ENDOSCOPY;  Service: Cardiovascular;  Laterality: N/A;  . COLON RESECTION  12/25/2011   Procedure: COLON RESECTION LAPAROSCOPIC;  Surgeon: Shann Medal, MD;  Location: [place];  Service: General;  Laterality: Right;  laparoscopic assisted right colon resection  . COLONOSCOPY  11/11/2011   Procedure: COLONOSCOPY;  Surgeon: Missy Sabins, MD;  Location: [place];  Service: Endoscopy;  Laterality: N/A;  . CORONARY ANGIOPLASTY WITH STENT PLACEMENT  11/11/2010   "1"  . CYSTECTOMY  ~ 1980   right eyelid  . TEE WITHOUT CARDIOVERSION  11/06/2011   Procedure: TRANSESOPHAGEAL ECHOCARDIOGRAM (TEE);  Surgeon: Laverda Page, MD;  Location: [facility] ENDOSCOPY;  Service: Cardiovascular;  Laterality: N/A;     Family History  Problem Relation Age of Onset  . Hypertension Mother   . Heart failure Father   . Cancer Neg Hx   . Diabetes Neg Hx   . Hyperlipidemia Neg Hx   . Sudden death Neg Hx   . Stroke Neg Hx   . Heart attack Neg Hx      Social History   Social History  . Marital status: Married    Spouse name: N/A  . Number of children: N/A  . Years of education: N/A   Occupational History  . Not on file.   Social History Main Topics  . Smoking status: Never Smoker  . Smokeless tobacco: Never Used     Comment: 12/24/2011 "used to puff cigarettes; never inhaled"  . Alcohol use No  . Drug use: No  . Sexual activity: No   Other Topics Concern  . Not on file   Social History Narrative  . No narrative on file     BP 134/82   Pulse (!) 110   Ht 6' (1.829 m)   Wt 189 lb 6.4 oz (85.9 kg)   SpO2 96%   BMI 25.69 kg/m   Physical Exam:  Well appearing NAD HEENT: Unremarkable Neck:  No JVD, no thyromegally Lymphatics:  No  adenopathy Back:  No CVA tenderness Lungs:  Clear HEART:  Regular rate rhythm, no murmurs, no rubs, no clicks Abd:  soft, positive bowel sounds, no organomegally, no rebound, no guarding Ext:  2  plus pulses, no edema, no cyanosis, no clubbing Skin:  No rashes no nodules Neuro:  CN II through XII intact, motor grossly intact  EKG - atypical atrial flutter with a RVR  Assess/Plan: 1. Atrial flutter - he has had a recurrent of atypical atrial flutter. It is unclear if this represents atypical isthmus dependent flutter or left atrial flutter. He feels poorly. I have discussed the treatment options. We have decided to proceed with EP study and ablation of flutter. We will use anesthesia and CARTO and ICE. Will plan TEE before the procedure in case I need to go transeptal and ablate in the [place]. He has been therapeutically anti-coagulated.  2. LBBB - I have also discussed AV node ablation and his bundle biv pacing. Will hope to ablate his flutter. 3. HCM - he has not had syncope.  Mikle Bosworth.D.

## 2016-09-11 NOTE — Patient Instructions (Signed)
Medication Instructions:  Please continue all medications as listed.  Labwork: Lab appointment for BMET/CBC on 10/13/16 at the [facility] office  Testing/Procedures: Your physician has requested that you have a TEE. During a TEE, sound waves are used to create images of your heart. It provides your doctor with information about the size and shape of your heart and how well your heart's chambers and valves are working. In this test, a transducer is attached to the end of a flexible tube that's guided down your throat and into your esophagus (the tube leading from you mouth to your stomach) to get a more detailed image of your heart. You are not awake for the procedure. Please see the instruction sheet given to you today. For further information please visit [email or web address] day prior to ablation or day of - Someone will call you will scheduled date/time      Your physician has recommended that you have an ablation. Catheter ablation is a medical procedure used to treat some cardiac arrhythmias (irregular heartbeats). During catheter ablation, a long, thin, flexible tube is put into a blood vessel in your groin (upper thigh), or neck. This tube is called an ablation catheter. It is then guided to your heart through the blood vessel. Radio frequency waves destroy small areas of heart tissue where abnormal heartbeats may cause an arrhythmia to start. Please see the instruction sheet given to you today.   Follow-Up: Follow-up appointment and 4 weeks with Dr. [name].   Any Other Special Instructions Will Be Listed Below (If Applicable).  Eliquis - take the night before procedure HOLD morning of procedure  Please report to the [facility] 10/21/16 at 6:30 AM  Nothing to eat or drink after midnight prior to procedure  Do not take any medication prior to procedure  Plan 1 night stay OR someone to drive you home    If you need a refill on  your cardiac medications before your next appointment, please call your pharmacy.

## 2016-09-15 ENCOUNTER — Telehealth: Payer: Self-pay

## 2016-09-15 NOTE — Telephone Encounter (Signed)
Called, spoke with pt. Informed TEE is scheduled for 10/20/16 arriving at 6:30 AM to the [place] [facility]. Informed nothing to eat or drink after midnight - pt may take medications with a sip of water. Pt may have clear liquids only morning of test and may take meds with a sip of water. Pt must have someone to drive him home after the procedure. Pt verbalized understanding.

## 2016-09-16 ENCOUNTER — Encounter (HOSPITAL_COMMUNITY): Payer: Self-pay | Admitting: Emergency Medicine

## 2016-09-16 ENCOUNTER — Ambulatory Visit (HOSPITAL_COMMUNITY)
Admission: EM | Admit: 2016-09-16 | Discharge: 2016-09-16 | Disposition: A | Discharge status: Home / Self Care | Payer: Medicare Other | Attending: Family Medicine | Admitting: Family Medicine

## 2016-09-16 DIAGNOSIS — R21 Rash and other nonspecific skin eruption: Secondary | ICD-10-CM

## 2016-09-16 MED ORDER — TRIAMCINOLONE ACETONIDE 0.1 % EX CREA: 1.0000 "application " | TOPICAL_CREAM | Freq: Two times a day (BID) | CUTANEOUS | 0 refills | Status: DC

## 2016-09-16 NOTE — Telephone Encounter (Signed)
Called, spoke with pt. Informed TEE is scheduled for 10/20/16, pt arriving at 6:30 AM. Ablation is scheduled for 10/21/16, pt arriving at 6:30 AM. Both procedure are scheduled at [facility]. Reviewed pre-procedure instruction for both procedures. Pt verbalized understanding.

## 2016-09-16 NOTE — Telephone Encounter (Signed)
Follow up      Calling to confirm times of procedures on 7-17 and 7-18.  Please call

## 2016-09-16 NOTE — ED Triage Notes (Signed)
The patient presented to the [facility] with a complaint of a possible insect bite on the right side of his neck that he noticed yesterday.

## 2016-09-17 NOTE — ED Provider Notes (Signed)
[place]    CSN: [id number] Arrival date & time: 09/16/16  1837     History   Chief Complaint Chief Complaint  Patient presents with  . Insect Bite    HPI [name] is a 76 y.o. male. Reports possible "insect bite" near R neck. Noticed yesterday. Slight increase in redness but otherwise unchanged. Itches. No pain. No other lesions seen or reported. Afebrile. No n/v. No neck swelling or ROM loss.  HPI  Past Medical History:  Diagnosis Date  . Anemia   . Arthritis   . Chronic lower back pain    "last 3 months; usually when I bend" (12/24/2011)  . Coronary artery disease   . Endocarditis   . GERD (gastroesophageal reflux disease)   . Heart murmur   . High cholesterol   . Hypertension   . Migraines    "none in the last couple years" (12/24/2011)  . Myocardial infarction [facility])    POSSIBLE 2012 ELEVATED ENZYMES  . Pneumonia ~ 2011    Patient Active Problem List   Diagnosis Date Noted  . Typical atrial flutter ([place]) 08/12/2016  . Atrial flutter ([place]) 08/12/2016  . Hypotension 05/06/2016  . Atrial fibrillation with RVR ([place]) 05/06/2016  . AKI (acute kidney injury) ([place]) 05/06/2016  . SVT (supraventricular tachycardia) ([place]) 01/31/2015  . Colonic mass, right colon. 11/10/2011  . Acute bacterial endocarditis 11/06/2011  . HTN (hypertension) 11/06/2011  . BPH (benign prostatic hyperplasia) 11/06/2011  . 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 Back pain 11/06/2011  . Hyperlipidemia 11/06/2011  . Acute renal insufficiency 08/25/2010  . Viridans streptococci infection 08/25/2010  . Streptococcus infection 08/25/2010  . Tooth infection 08/25/2010  . Status post PICC central line placement 08/25/2010  . Arm pain, right 08/25/2010  . ENDOCARDITIS, BACTERIAL, SUBACUTE 05/07/2010  . Iron deficiency anemia 05/06/2010  . Hypertrophic obstructive cardiomyopathy ([place]) 05/06/2010  . SYNCOPE 05/06/2010  . DYSPNEA ON EXERTION 05/06/2010  . ECHOCARDIOGRAM,  ABNORMAL 05/06/2010  . HEART MURMUR, HX OF 05/06/2010    Past Surgical History:  Procedure Laterality Date  . A-FLUTTER ABLATION N/A 08/12/2016   Procedure: A-Flutter Ablation;  Surgeon: Evans Lance, MD;  Location: [place] CV LAB;  Service: Cardiovascular;  Laterality: N/A;  . CARDIOVERSION N/A 10/29/2015   Procedure: CARDIOVERSION;  Surgeon: Adrian Prows, MD;  Location: [facility] ENDOSCOPY;  Service: Cardiovascular;  Laterality: N/A;  . COLON RESECTION  12/25/2011   Procedure: COLON RESECTION LAPAROSCOPIC;  Surgeon: Shann Medal, MD;  Location: [place];  Service: General;  Laterality: Right;  laparoscopic assisted right colon resection  . COLONOSCOPY  11/11/2011   Procedure: COLONOSCOPY;  Surgeon: Missy Sabins, MD;  Location: [place];  Service: Endoscopy;  Laterality: N/A;  . CORONARY ANGIOPLASTY WITH STENT PLACEMENT  11/11/2010   "1"  . CYSTECTOMY  ~ 1980   right eyelid  . TEE WITHOUT CARDIOVERSION  11/06/2011   Procedure: TRANSESOPHAGEAL ECHOCARDIOGRAM (TEE);  Surgeon: Laverda Page, MD;  Location: [place];  Service: Cardiovascular;  Laterality: N/A;       Home Medications    Prior to Admission medications   Medication Sig Start Date End Date Taking? Authorizing Provider  albuterol (PROVENTIL HFA;VENTOLIN HFA) 108 (90 Base) MCG/ACT inhaler Inhale 1-2 puffs into the lungs every 6 (six) hours as needed for wheezing or shortness of breath. 07/07/16   Janne Napoleon, NP  amiodarone (PACERONE) 200 MG tablet Take 1 tablet (200 mg total) by mouth daily. 02/05/16   Evans Lance, MD  apixaban (ELIQUIS) 5 MG TABS tablet Take 5 mg by mouth 2 (two) times daily.    [provider]  Ascorbic Acid (VITAMIN C) 500 MG tablet Take 500 mg by mouth 2 (two) times daily.      [provider]  b complex vitamins tablet Take 1 tablet by mouth daily.    [provider]  Bilberry 1000 MG CAPS Take 1 capsule by mouth daily.    [provider]  Cholecalciferol (VITAMIN D3  PO) Take 1 capsule by mouth daily.    [provider]  clotrimazole (LOTRIMIN) 1 % cream Apply 1 application topically 2 (two) times daily. Apply to affected area as directed    [provider]  Coenzyme Q10 (EQL COQ10) 300 MG CAPS Take 1 capsule by mouth every morning.     [provider]  Ferrous Sulfate (IRON) 325 (65 Fe) MG TABS Take 1 tablet by mouth daily.    [provider]  Glucosamine HCl 1000 MG TABS Take 1 tablet by mouth 2 (two) times daily.    [provider]  HAWTHORN BERRY PO Take 1 capsule by mouth every morning.     [provider]  HORSE CHESTNUT PO Take 1 capsule by mouth daily.    [provider]  ipratropium (ATROVENT) 0.06 % nasal spray 2 sprays into each nostril 4 times a day as needed for runny nose. 07/07/16   Janne Napoleon, NP  metoprolol succinate (TOPROL XL) 25 MG 24 hr tablet Take 1 tablet (25 mg total) by mouth 2 (two) times daily. 09/02/16   Evans Lance, MD  Multiple Vitamin (MULTIVITAMIN WITH MINERALS) TABS Take 1 tablet by mouth every morning.     [provider]  Omega-3 Fatty Acids (FISH OIL) 1000 MG CAPS Take 1 capsule by mouth daily.     [provider]  Polyethyl Glycol-Propyl Glycol (SYSTANE) 0.4-0.3 % SOLN Place 1 drop into both eyes daily.    [provider]  pravastatin (PRAVACHOL) 40 MG tablet Take 40 mg by mouth at bedtime.     [provider]  ranitidine (ZANTAC) 75 MG tablet Take 75 mg by mouth 2 (two) times daily.    [provider]  saw palmetto 160 MG capsule Take 160 mg by mouth 2 (two) times daily.     [provider]  triamcinolone cream (KENALOG) 0.1 % Apply 1 application topically 2 (two) times daily. 09/16/16   Vanessa Kick, MD  vitamin E 400 UNIT capsule Take 400 Units by mouth daily.     [provider]  VITAMIN K PO Take 1 capsule by mouth daily.    [provider]    Family History Family History  Problem  Relation Age of Onset  . Hypertension Mother   . Heart failure Father   . Cancer Neg Hx   . Diabetes Neg Hx   . Hyperlipidemia Neg Hx   . Sudden death Neg Hx   . Stroke Neg Hx   . Heart attack Neg Hx     Social History Social History  Substance Use Topics  . Smoking status: Never Smoker  . Smokeless tobacco: Never Used     Comment: 12/24/2011 "used to puff cigarettes; never inhaled"  . Alcohol use No     Allergies   Adhesive [tape]; Brilinta [ticagrelor]; and Penicillins   Review of Systems Review of Systems  Constitutional: Negative for chills and fever.  HENT: Negative for dental problem and facial swelling.  Physical Exam Triage Vital Signs ED Triage Vitals  Enc Vitals Group     BP 09/16/16 1905 122/73     Pulse Rate 09/16/16 1905 (!) 112     Resp 09/16/16 1905 18     Temp 09/16/16 1905 98 F (36.7 C)     Temp Source 09/16/16 1905 Oral     SpO2 09/16/16 1905 98 %     Weight --      Height --      Head Circumference --      Peak Flow --      Pain Score 09/16/16 1904 0     Pain Loc --      Pain Edu? --      Excl. in [place]? --    No data found.   Updated Vital Signs BP 122/73 (BP Location: Right Arm)   Pulse (!) 112   Temp 98 F (36.7 C) (Oral)   Resp 18   SpO2 98%    Physical Exam  Constitutional: He appears well-developed and well-nourished. No distress.  Skin: He is not diaphoretic.  No cervical LAD. Neck with FROM. Skin: Base of R neck with 2-3 indurations approx 5 mm each. Mild surrounding erythema. Non-tender to touch. No fluctuance. Normal skin temperature.   UC Treatments / Results  Labs (all labs ordered are listed, but only abnormal results are displayed) Labs Reviewed - No data to display  EKG  EKG Interpretation None       Radiology No results found.  Procedures Procedures (including critical care time)  Medications Ordered in UC Medications - No data to display   Initial Impression / Assessment and Plan / UC  Course  I have reviewed the triage vital signs and the nursing notes.  Pertinent labs & imaging results that were available during my care of the patient were reviewed by me and considered in my medical decision making (see chart for details).     \  Final Clinical Impressions(s) / UC Diagnoses   Final diagnoses:  Rash   Discussed possibility of insect bite vs hypersensitivity. Does not look like shingles. No sign of infection. Prefers OTC antihistamine in addition to steroid cream given for itching which is mild. May RTC as needed.  New Prescriptions Discharge Medication List as of 09/16/2016  7:36 PM    START taking these medications   Details  triamcinolone cream (KENALOG) 0.1 % Apply 1 application topically 2 (two) times daily., Starting Wed 09/16/2016, Normal         Vanessa Kick, MD 09/17/16 1014

## 2016-09-23 ENCOUNTER — Encounter (HOSPITAL_COMMUNITY): Payer: Self-pay | Admitting: Emergency Medicine

## 2016-09-23 ENCOUNTER — Ambulatory Visit (HOSPITAL_COMMUNITY)
Admission: EM | Admit: 2016-09-23 | Discharge: 2016-09-23 | Disposition: A | Discharge status: Home / Self Care | Payer: Medicare Other | Attending: Family Medicine | Admitting: Family Medicine

## 2016-09-23 DIAGNOSIS — L309 Dermatitis, unspecified: Secondary | ICD-10-CM | POA: Diagnosis not present

## 2016-09-23 DIAGNOSIS — R21 Rash and other nonspecific skin eruption: Secondary | ICD-10-CM | POA: Diagnosis not present

## 2016-09-23 MED ORDER — PREDNISONE 10 MG (21) PO TBPK: ORAL_TABLET | ORAL | 0 refills | Status: DC

## 2016-09-23 NOTE — Discharge Instructions (Signed)
Please follow up if not improving within the next 48-72 hours.

## 2016-09-23 NOTE — ED Triage Notes (Signed)
Seen 6/13 for insect bites to neck.  Now red bumps have spread to face, chest, groin, feet.  Bumps do itch

## 2016-09-23 NOTE — ED Provider Notes (Signed)
  [name]   [id number] 09/23/16 Arrival Time: 1020  ASSESSMENT & PLAN:  1. Dermatitis     Meds ordered this encounter  Medications  . predniSONE (STERAPRED UNI-PAK 21 TAB) 10 MG (21) TBPK tablet    Sig: Start 6 tablets on day #1 then decrease by one tablet daily until gone.    Dispense:  21 tablet    Refill:  0    Still feel this is some type of hypersensitivity reaction. Possibly related to bites. Hopefully prednisone will help significantly. Reassured that previous areas have resolved.  Outlined signs and symptoms indicating need for more acute intervention. Follow up here or in the Emergency Department if worsening. Patient verbalized understanding. After Visit Summary given.   SUBJECTIVE:  [name] is a 76 y.o. male who return with more extensive rash. Areas from previous visit almost gone. Steroid cream helped. Now on anterior chest and groin region. He did have ants in his home around the time this started. Questions relation. Otherwise well. Remains afebrile. No new medications before this started.  ROS: As per HPI.   OBJECTIVE:  Vitals:   09/23/16 1051  BP: 106/64  Pulse: 78  Resp: 18  Temp: 97.9 F (36.6 C)  TempSrc: Oral  SpO2: 98%     General appearance: alert, cooperative, appears stated age and no distress Nose: Nares normal. Mucosa normal. Throat: lips, mucosa, and tongue normal; teeth and gums normal Extremities: extremities normal, atraumatic, no cyanosis or edema Skin: Areas on neck from previous visit are almost completely gone. Similar solitary papules scattered over anterior left chest and inguinal area. Newer are light pink. Older are darker red. No blanching. Just a few solitary papules on left forearm. Lymph nodes: no lymp  Allergies  Allergen Reactions  . Adhesive [Tape] Itching    The patches on back, when he had his ablasion  . Brilinta [Ticagrelor] Itching and Rash  . Penicillins Rash    Has patient had a PCN  reaction causing immediate rash, facial/tongue/throat swelling, SOB or lightheadedness with hypotension: Yes Has patient had a PCN reaction causing severe rash involving mucus membranes or skin necrosis: Yes Has patient had a PCN reaction that required hospitalization No Has patient had a PCN reaction occurring within the last 10 years: No If all of the above answers are "NO", then may proceed with Cephalosporin use.     PMHx, SurgHx, SocialHx, Medications, and Allergies were reviewed in the Visit Navigator and updated as appropriate.       Vanessa Kick, MD 09/23/16 1126

## 2016-10-02 ENCOUNTER — Encounter (HOSPITAL_COMMUNITY): Payer: Self-pay | Admitting: Emergency Medicine

## 2016-10-02 ENCOUNTER — Ambulatory Visit (HOSPITAL_COMMUNITY)
Admission: EM | Admit: 2016-10-02 | Discharge: 2016-10-02 | Disposition: A | Discharge status: Home / Self Care | Payer: Medicare Other | Attending: Internal Medicine | Admitting: Internal Medicine

## 2016-10-02 DIAGNOSIS — L309 Dermatitis, unspecified: Secondary | ICD-10-CM | POA: Diagnosis not present

## 2016-10-02 MED ORDER — PREDNISONE 10 MG (21) PO TBPK: ORAL_TABLET | ORAL | 0 refills | Status: DC

## 2016-10-02 NOTE — Discharge Instructions (Signed)
Start new Prednisone dose pack as directed. Rewash clothing that was exposed to new detergent. Look around house/bed for any bug that may be biting you. There is no signs of infection around the rashes. Follow up with Dermatologist if the rash does not get better, or symptoms worsens.

## 2016-10-02 NOTE — ED Triage Notes (Signed)
Pt here for persistent rash .... Seen here on 09/23/16 for similar sx  Given prednisone w/temp relief.

## 2016-10-02 NOTE — ED Provider Notes (Signed)
CSN: [id number]     Arrival date & time 10/02/16  1501 History   None    Chief Complaint  Patient presents with  . Rash   (Consider location/radiation/quality/duration/timing/severity/associated sxs/prior Treatment) 76 yo male comes in for persistent rash. Patient has been seen 2 previous times at this office. He was last treated with oral prednisone for hypersensitivity and had good relief. However, rash still persists. While old locations have healed, rash still continues to show up along his torso, back and groin area. Rash continues to be pruritic in nature. Patient states beginning of rash appearance, he had noticed ants in the house, but has since then had them removed. He did get a new detergent for clothes that may be causing the irritation. Denies fever, chills, night sweats. Denies pain, increased redness, increased warmth.       Past Medical History:  Diagnosis Date  . Anemia   . Arthritis   . Chronic lower back pain    "last 3 months; usually when I bend" (12/24/2011)  . Coronary artery disease   . Endocarditis   . GERD (gastroesophageal reflux disease)   . Heart murmur   . High cholesterol   . Hypertension   . Migraines    "none in the last couple years" (12/24/2011)  . Myocardial infarction [facility])    POSSIBLE 2012 ELEVATED ENZYMES  . Pneumonia ~ 2011   Past Surgical History:  Procedure Laterality Date  . A-FLUTTER ABLATION N/A 08/12/2016   Procedure: A-Flutter Ablation;  Surgeon: Evans Lance, MD;  Location: [place] CV LAB;  Service: Cardiovascular;  Laterality: N/A;  . CARDIOVERSION N/A 10/29/2015   Procedure: CARDIOVERSION;  Surgeon: Adrian Prows, MD;  Location: [facility] ENDOSCOPY;  Service: Cardiovascular;  Laterality: N/A;  . COLON RESECTION  12/25/2011   Procedure: COLON RESECTION LAPAROSCOPIC;  Surgeon: Shann Medal, MD;  Location: [place];  Service: General;  Laterality: Right;  laparoscopic assisted right colon resection  . COLONOSCOPY  11/11/2011   Procedure:  COLONOSCOPY;  Surgeon: Missy Sabins, MD;  Location: [place];  Service: Endoscopy;  Laterality: N/A;  . CORONARY ANGIOPLASTY WITH STENT PLACEMENT  11/11/2010   "1"  . CYSTECTOMY  ~ 1980   right eyelid  . TEE WITHOUT CARDIOVERSION  11/06/2011   Procedure: TRANSESOPHAGEAL ECHOCARDIOGRAM (TEE);  Surgeon: Laverda Page, MD;  Location: [facility] ENDOSCOPY;  Service: Cardiovascular;  Laterality: N/A;   Family History  Problem Relation Age of Onset  . Hypertension Mother   . Heart failure Father   . Cancer Neg Hx   . Diabetes Neg Hx   . Hyperlipidemia Neg Hx   . Sudden death Neg Hx   . Stroke Neg Hx   . Heart attack Neg Hx    Social History  Substance Use Topics  . Smoking status: Never Smoker  . Smokeless tobacco: Never Used     Comment: 12/24/2011 "used to puff cigarettes; never inhaled"  . Alcohol use No    Review of Systems  Constitutional: Negative for chills, diaphoresis and fever.  Respiratory: Negative for cough, shortness of breath and wheezing.   Cardiovascular: Negative for chest pain and palpitations.  Musculoskeletal: Negative for joint swelling and myalgias.  Skin: Positive for rash.    Allergies  Adhesive [tape]; Brilinta [ticagrelor]; and Penicillins  Home Medications   Prior to Admission medications   Medication Sig Start Date End Date Taking? Authorizing Provider  amiodarone (PACERONE) 200 MG tablet Take 1 tablet (200 mg total) by mouth daily. 02/05/16  Yes Evans Lance, MD  apixaban (ELIQUIS) 5 MG TABS tablet Take 5 mg by mouth 2 (two) times daily.   Yes [provider]  Ascorbic Acid (VITAMIN C) 500 MG tablet Take 500 mg by mouth 2 (two) times daily.     Yes [provider]  b complex vitamins tablet Take 1 tablet by mouth daily.   Yes [provider]  Bilberry 1000 MG CAPS Take 1 capsule by mouth daily.   Yes [provider]  Cholecalciferol (VITAMIN D3 PO) Take 1 capsule by mouth daily.   Yes [provider]   Coenzyme Q10 (EQL COQ10) 300 MG CAPS Take 1 capsule by mouth every morning.    Yes [provider]  Ferrous Sulfate (IRON) 325 (65 Fe) MG TABS Take 1 tablet by mouth daily.   Yes [provider]  Glucosamine HCl 1000 MG TABS Take 1 tablet by mouth 2 (two) times daily.   Yes [provider]  HAWTHORN BERRY PO Take 1 capsule by mouth every morning.    Yes [provider]  HORSE CHESTNUT PO Take 1 capsule by mouth daily.   Yes [provider]  ipratropium (ATROVENT) 0.06 % nasal spray 2 sprays into each nostril 4 times a day as needed for runny nose. 07/07/16  Yes Mabe, Shanon Brow, NP  metoprolol succinate (TOPROL XL) 25 MG 24 hr tablet Take 1 tablet (25 mg total) by mouth 2 (two) times daily. 09/02/16  Yes Evans Lance, MD  Multiple Vitamin (MULTIVITAMIN WITH MINERALS) TABS Take 1 tablet by mouth every morning.    Yes [provider]  Omega-3 Fatty Acids (FISH OIL) 1000 MG CAPS Take 1 capsule by mouth daily.    Yes [provider]  Polyethyl Glycol-Propyl Glycol (SYSTANE) 0.4-0.3 % SOLN Place 1 drop into both eyes daily.   Yes [provider]  pravastatin (PRAVACHOL) 40 MG tablet Take 40 mg by mouth at bedtime.    Yes [provider]  ranitidine (ZANTAC) 75 MG tablet Take 75 mg by mouth 2 (two) times daily.   Yes [provider]  saw palmetto 160 MG capsule Take 160 mg by mouth 2 (two) times daily.    Yes [provider]  triamcinolone cream (KENALOG) 0.1 % Apply 1 application topically 2 (two) times daily. 09/16/16  Yes Vanessa Kick, MD  vitamin E 400 UNIT capsule Take 400 Units by mouth daily.    Yes [provider]  VITAMIN K PO Take 1 capsule by mouth daily.   Yes [provider]  albuterol (PROVENTIL HFA;VENTOLIN HFA) 108 (90 Base) MCG/ACT inhaler Inhale 1-2 puffs into the lungs every 6 (six) hours as needed for wheezing or shortness of breath. 07/07/16   Janne Napoleon, NP  clotrimazole  (LOTRIMIN) 1 % cream Apply 1 application topically 2 (two) times daily. Apply to affected area as directed    [provider]  predniSONE (STERAPRED UNI-PAK 21 TAB) 10 MG (21) TBPK tablet Start 6 tablets on day #1 then decrease by one tablet daily until gone. 10/02/16   Ok Edwards, PA-C   Meds Ordered and Administered this Visit  Medications - No data to display  BP (!) 104/56 (BP Location: Left Arm) Comment: notified cma  Pulse 84   Temp 98.8 F (37.1 C) (Oral)   Resp 16   SpO2 98%  No data found.   Physical Exam  Constitutional: He is oriented to person, place, and time. He appears well-developed and  well-nourished. No distress.  HENT:  Head: Normocephalic and atraumatic.  Eyes: Conjunctivae are normal. Pupils are equal, round, and reactive to light.  Neurological: He is alert and oriented to person, place, and time.  Skin: Skin is warm and dry.  Papular rash scattered in anterior chest, and lower back. Isolated papules on right side of neck. In different stages of healing with light red to dark red. Not following dermatomes.   Psychiatric: He has a normal mood and affect. His behavior is normal. Judgment normal.    Urgent Care Course     Procedures (including critical care time)  Labs Review Labs Reviewed - No data to display  Imaging Review No results found.       MDM   1. Dermatitis    1. Discussed with patient likely still exposed to source of dermatitis, causing reaction to continue. Patient to switch back to old detergent, examine house and bed for bugs. New prescription for Prednisone dosepak given to patient. Patient to follow up with his dermatologist if rash worsens or does not resolve for further evaluation.     Ok Edwards, PA-C 10/02/16 1806

## 2016-10-13 ENCOUNTER — Other Ambulatory Visit: Payer: Medicare Other

## 2016-10-13 DIAGNOSIS — I4892 Unspecified atrial flutter: Secondary | ICD-10-CM | POA: Diagnosis not present

## 2016-10-13 DIAGNOSIS — Z01812 Encounter for preprocedural laboratory examination: Secondary | ICD-10-CM

## 2016-10-15 LAB — CBC WITH DIFFERENTIAL/PLATELET
BASOS ABS: 0 10*3/uL (ref 0.0–0.2)
Basos: 0 %
EOS (ABSOLUTE): 0.1 10*3/uL (ref 0.0–0.4)
EOS: 2 %
HEMOGLOBIN: 10.9 g/dL — AB (ref 13.0–17.7)
Hematocrit: 33.8 % — ABNORMAL LOW (ref 37.5–51.0)
IMMATURE GRANS (ABS): 0 10*3/uL (ref 0.0–0.1)
Immature Granulocytes: 0 %
LYMPHS: 17 %
Lymphocytes Absolute: 1 10*3/uL (ref 0.7–3.1)
MCH: 28.9 pg (ref 26.6–33.0)
MCHC: 32.2 g/dL (ref 31.5–35.7)
MCV: 90 fL (ref 79–97)
MONOCYTES: 24 %
Monocytes Absolute: 1.3 10*3/uL — ABNORMAL HIGH (ref 0.1–0.9)
NEUTROS ABS: 3.2 10*3/uL (ref 1.4–7.0)
Neutrophils: 57 %
Platelets: 138 10*3/uL — ABNORMAL LOW (ref 150–379)
RBC: 3.77 x10E6/uL — ABNORMAL LOW (ref 4.14–5.80)
RDW: 16.7 % — AB (ref 12.3–15.4)
WBC: 5.7 10*3/uL (ref 3.4–10.8)

## 2016-10-15 LAB — BASIC METABOLIC PANEL
BUN/Creatinine Ratio: 13 (ref 10–24)
BUN: 18 mg/dL (ref 8–27)
CALCIUM: 8.4 mg/dL — AB (ref 8.6–10.2)
CHLORIDE: 102 mmol/L (ref 96–106)
CO2: 23 mmol/L (ref 20–29)
CREATININE: 1.39 mg/dL — AB (ref 0.76–1.27)
GFR calc non Af Amer: 50 mL/min/{1.73_m2} — ABNORMAL LOW (ref >59)
GFR, EST AFRICAN AMERICAN: 57 mL/min/{1.73_m2} — AB (ref >59)
Glucose: 101 mg/dL — ABNORMAL HIGH (ref 65–99)
Potassium: 4.6 mmol/L (ref 3.5–5.2)
Sodium: 138 mmol/L (ref 134–144)

## 2016-10-19 ENCOUNTER — Telehealth: Payer: Self-pay | Admitting: Internal Medicine

## 2016-10-19 NOTE — Telephone Encounter (Signed)
New message     Patient has questions on procedures Tuesday and Wednesday.    . What time is he suppose to check in at hospital?   What are his instructions ?

## 2016-10-19 NOTE — Telephone Encounter (Signed)
Called, spoke with pt. Reviewed all pre procedure instructions for TEE tomorrow and ablation on 10/21/16. Pt verbalized understanding and thanked me for calling.

## 2016-10-20 ENCOUNTER — Ambulatory Visit (HOSPITAL_COMMUNITY)
Admission: RE | Admit: 2016-10-20 | Discharge: 2016-10-20 | Disposition: A | Discharge status: Home / Self Care | Payer: Medicare Other | Source: Ambulatory Visit | Attending: Cardiology | Admitting: Cardiology

## 2016-10-20 ENCOUNTER — Ambulatory Visit (HOSPITAL_BASED_OUTPATIENT_CLINIC_OR_DEPARTMENT_OTHER): Payer: Medicare Other

## 2016-10-20 ENCOUNTER — Encounter (HOSPITAL_COMMUNITY)
Admission: RE | Disposition: A | Discharge status: Home / Self Care | Payer: Self-pay | Source: Ambulatory Visit | Attending: Cardiology

## 2016-10-20 ENCOUNTER — Encounter (HOSPITAL_COMMUNITY): Payer: Self-pay | Admitting: *Deleted

## 2016-10-20 DIAGNOSIS — I350 Nonrheumatic aortic (valve) stenosis: Secondary | ICD-10-CM

## 2016-10-20 DIAGNOSIS — I447 Left bundle-branch block, unspecified: Secondary | ICD-10-CM

## 2016-10-20 DIAGNOSIS — I4892 Unspecified atrial flutter: Secondary | ICD-10-CM

## 2016-10-20 DIAGNOSIS — R011 Cardiac murmur, unspecified: Secondary | ICD-10-CM

## 2016-10-20 DIAGNOSIS — Z880 Allergy status to penicillin: Secondary | ICD-10-CM | POA: Insufficient documentation

## 2016-10-20 DIAGNOSIS — Z79899 Other long term (current) drug therapy: Secondary | ICD-10-CM | POA: Insufficient documentation

## 2016-10-20 DIAGNOSIS — I10 Essential (primary) hypertension: Secondary | ICD-10-CM

## 2016-10-20 DIAGNOSIS — E7800 Pure hypercholesterolemia, unspecified: Secondary | ICD-10-CM

## 2016-10-20 DIAGNOSIS — Z888 Allergy status to other drugs, medicaments and biological substances status: Secondary | ICD-10-CM | POA: Insufficient documentation

## 2016-10-20 DIAGNOSIS — R002 Palpitations: Secondary | ICD-10-CM

## 2016-10-20 DIAGNOSIS — Z8249 Family history of ischemic heart disease and other diseases of the circulatory system: Secondary | ICD-10-CM

## 2016-10-20 DIAGNOSIS — I252 Old myocardial infarction: Secondary | ICD-10-CM

## 2016-10-20 DIAGNOSIS — Z7901 Long term (current) use of anticoagulants: Secondary | ICD-10-CM | POA: Insufficient documentation

## 2016-10-20 DIAGNOSIS — K219 Gastro-esophageal reflux disease without esophagitis: Secondary | ICD-10-CM

## 2016-10-20 DIAGNOSIS — I2510 Atherosclerotic heart disease of native coronary artery without angina pectoris: Secondary | ICD-10-CM | POA: Insufficient documentation

## 2016-10-20 HISTORY — PX: TEE WITHOUT CARDIOVERSION: SHX5443

## 2016-10-20 SURGERY — ECHOCARDIOGRAM, TRANSESOPHAGEAL
Anesthesia: Moderate Sedation

## 2016-10-20 MED ORDER — FENTANYL CITRATE (PF) 100 MCG/2ML IJ SOLN
INTRAMUSCULAR | Status: DC | PRN
  Administered 2016-10-20 (×2): 12.5 ug via INTRAVENOUS
  Administered 2016-10-20: 25 ug via INTRAVENOUS

## 2016-10-20 MED ORDER — BUTAMBEN-TETRACAINE-BENZOCAINE 2-2-14 % EX AERO
INHALATION_SPRAY | CUTANEOUS | Status: DC | PRN
  Administered 2016-10-20: 2 via TOPICAL

## 2016-10-20 MED ORDER — MIDAZOLAM HCL 10 MG/2ML IJ SOLN
INTRAMUSCULAR | Status: DC | PRN
  Administered 2016-10-20 (×2): 2 mg via INTRAVENOUS

## 2016-10-20 MED ORDER — MIDAZOLAM HCL 5 MG/ML IJ SOLN
INTRAMUSCULAR | Status: AC
  Filled 2016-10-20: qty 2

## 2016-10-20 MED ORDER — SODIUM CHLORIDE 0.9 % IV SOLN
INTRAVENOUS | Status: DC
  Administered 2016-10-20: 07:00:00 via INTRAVENOUS

## 2016-10-20 MED ORDER — FENTANYL CITRATE (PF) 100 MCG/2ML IJ SOLN
INTRAMUSCULAR | Status: AC
  Filled 2016-10-20: qty 2

## 2016-10-20 NOTE — H&P (Signed)
[name] is an 76 y.o. male.    Chief Complaint: Outpatient TEE HPI: [name] comes today for a TEE prior to flutter ablation. He is a pleasant 76 yo man with atrial flutter and LBBB, and HCM who underwent EP study and catheter ablation several weeks ago. He did well for about 2 weeks after the procedure but developed recurrent palpitations and was found to be back in atrial flutter. He has been on amiodarone. He had extensive scarring demonstrated in the RA during the last ablation. He had clockwise flutter. He is not as symptomatic as he was during the past.   Past Medical History:  Diagnosis Date  . Anemia   . Arthritis   . Chronic lower back pain    "last 3 months; usually when I bend" (12/24/2011)  . Coronary artery disease   . Endocarditis   . GERD (gastroesophageal reflux disease)   . Heart murmur   . High cholesterol   . Hypertension   . Migraines    "none in the last couple years" (12/24/2011)  . Myocardial infarction [facility])    POSSIBLE 2012 ELEVATED ENZYMES  . Pneumonia ~ 2011    Past Surgical History:  Procedure Laterality Date  . A-FLUTTER ABLATION N/A 08/12/2016   Procedure: A-Flutter Ablation;  Surgeon: Evans Lance, MD;  Location: [place] CV LAB;  Service: Cardiovascular;  Laterality: N/A;  . CARDIOVERSION N/A 10/29/2015   Procedure: CARDIOVERSION;  Surgeon: Adrian Prows, MD;  Location: [facility] ENDOSCOPY;  Service: Cardiovascular;  Laterality: N/A;  . COLON RESECTION  12/25/2011   Procedure: COLON RESECTION LAPAROSCOPIC;  Surgeon: Shann Medal, MD;  Location: [place];  Service: General;  Laterality: Right;  laparoscopic assisted right colon resection  . COLONOSCOPY  11/11/2011   Procedure: COLONOSCOPY;  Surgeon: Missy Sabins, MD;  Location: [place];  Service: Endoscopy;  Laterality: N/A;  . CORONARY ANGIOPLASTY WITH STENT PLACEMENT  11/11/2010   "1"  . CYSTECTOMY  ~ 1980   right eyelid  . TEE WITHOUT CARDIOVERSION  11/06/2011   Procedure: TRANSESOPHAGEAL  ECHOCARDIOGRAM (TEE);  Surgeon: Laverda Page, MD;  Location: [facility] ENDOSCOPY;  Service: Cardiovascular;  Laterality: N/A;    Family History  Problem Relation Age of Onset  . Hypertension Mother   . Heart failure Father   . Cancer Neg Hx   . Diabetes Neg Hx   . Hyperlipidemia Neg Hx   . Sudden death Neg Hx   . Stroke Neg Hx   . Heart attack Neg Hx    Social History:  reports that he has never smoked. He has never used smokeless tobacco. He reports that he does not drink alcohol or use drugs.  Allergies:  Allergies  Allergen Reactions  . Adhesive [Tape] Itching    The patches on back, when he had his ablasion  . Brilinta [Ticagrelor] Itching and Rash  . Penicillins Rash    Has patient had a PCN reaction causing immediate rash, facial/tongue/throat swelling, SOB or lightheadedness with hypotension: Yes Has patient had a PCN reaction causing severe rash involving mucus membranes or skin necrosis: Yes Has patient had a PCN reaction that required hospitalization No Has patient had a PCN reaction occurring within the last 10 years: No If all of the above answers are "NO", then may proceed with Cephalosporin use.     Medications Prior to Admission  Medication Sig Dispense Refill  . albuterol (PROVENTIL HFA;VENTOLIN HFA) 108 (90 Base) MCG/ACT inhaler Inhale 1-2 puffs into the lungs  every 6 (six) hours as needed for wheezing or shortness of breath. 1 Inhaler 0  . amiodarone (PACERONE) 200 MG tablet Take 1 tablet (200 mg total) by mouth daily. 180 tablet 3  . apixaban (ELIQUIS) 5 MG TABS tablet Take 5 mg by mouth 2 (two) times daily.    . Ascorbic Acid (VITAMIN C) 500 MG tablet Take 500 mg by mouth 2 (two) times daily.      Marland Kitchen b complex vitamins tablet Take 1 tablet by mouth daily.    . Bilberry 1000 MG CAPS Take 1,000 mg by mouth daily.     . Cholecalciferol (VITAMIN D PO) Take 1 capsule by mouth daily.    . clotrimazole (LOTRIMIN) 1 % cream Apply 1 application topically daily as  needed (rash). Apply to affected area as directed     . Coenzyme Q10 (EQL COQ10) 300 MG CAPS Take 1 capsule by mouth every morning.     . Ferrous Sulfate (IRON) 325 (65 Fe) MG TABS Take 325 mg by mouth daily.     . Glucosamine HCl 1000 MG TABS Take 1,000 mg by mouth 2 (two) times daily.     Marland Kitchen HAWTHORN BERRY PO Take 1 capsule by mouth every morning.     Marland Kitchen HORSE CHESTNUT PO Take 1 capsule by mouth daily.    . hydrocortisone cream 1 % Apply 1 application topically daily as needed for itching.    . imiquimod (ALDARA) 5 % cream Apply 1 application topically at bedtime.  1  . ipratropium (ATROVENT) 0.06 % nasal spray 2 sprays into each nostril 4 times a day as needed for runny nose. 15 mL 12  . metoprolol succinate (TOPROL XL) 25 MG 24 hr tablet Take 1 tablet (25 mg total) by mouth 2 (two) times daily. 60 tablet 3  . Multiple Vitamin (MULTIVITAMIN WITH MINERALS) TABS Take 1 tablet by mouth every morning.     Vladimir Faster Glycol-Propyl Glycol (SYSTANE) 0.4-0.3 % SOLN Place 1 drop into both eyes 2 (two) times daily.     . pravastatin (PRAVACHOL) 40 MG tablet Take 40 mg by mouth at bedtime.     . ranitidine (ZANTAC) 75 MG tablet Take 75 mg by mouth 2 (two) times daily.    . saw palmetto 160 MG capsule Take 160 mg by mouth 2 (two) times daily.     Marland Kitchen triamcinolone cream (KENALOG) 0.1 % Apply 1 application topically 2 (two) times daily. 15 g 0  . vitamin E 400 UNIT capsule Take 400 Units by mouth daily.     Marland Kitchen VITAMIN K PO Take 1 capsule by mouth daily.    . diphenhydrAMINE (BENADRYL) 25 MG tablet Take 25 mg by mouth daily as needed for itching.    . predniSONE (STERAPRED UNI-PAK 21 TAB) 10 MG (21) TBPK tablet Start 6 tablets on day #1 then decrease by one tablet daily until gone. (Patient not taking: Reported on 10/19/2016) 21 tablet 0    No results found for this or any previous visit (from the past 48 hour(s)). No results found.  ROS  Blood pressure 129/70, pulse 87, temperature 98.4 F (36.9 C),  temperature source Oral, resp. rate 10, height 6' (1.829 m), weight 195 lb (88.5 kg), SpO2 96 %. Physical Exam   Assessment/Plan  Mr. Hu comes today for a TEE prior to flutter ablation. No complains.   [name], MD 10/20/2016, 8:15 AM

## 2016-10-20 NOTE — Progress Notes (Signed)
  Echocardiogram Echocardiogram Transesophageal has been performed.  Jennette Dubin 10/20/2016, 9:21 AM

## 2016-10-20 NOTE — Anesthesia Preprocedure Evaluation (Addendum)
Anesthesia Evaluation  Patient identified by MRN, date of birth, ID band Patient awake    Reviewed: Allergy & Precautions, H&P , NPO status , Patient's Chart, lab work & pertinent test results, reviewed documented beta blocker date and time   Airway Mallampati: III  TM Distance: >3 FB Neck ROM: Full    Dental no notable dental hx. (+) Teeth Intact, Dental Advisory Given   Pulmonary neg pulmonary ROS,    Pulmonary exam normal breath sounds clear to auscultation       Cardiovascular Exercise Tolerance: Good hypertension, Pt. on medications and Pt. on home beta blockers + CAD and + Past MI  + dysrhythmias Atrial Fibrillation  Rhythm:Regular Rate:Normal + Systolic murmurs    Neuro/Psych  Headaches, negative psych ROS   GI/Hepatic Neg liver ROS, GERD  Medicated and Controlled,  Endo/Other  negative endocrine ROS  Renal/GU Renal InsufficiencyRenal disease  negative genitourinary   Musculoskeletal  (+) Arthritis , Osteoarthritis,    Abdominal   Peds  Hematology negative hematology ROS (+) anemia ,   Anesthesia Other Findings   Reproductive/Obstetrics negative OB ROS                            Anesthesia Physical Anesthesia Plan  ASA: III  Anesthesia Plan: General   Post-op Pain Management:    Induction: Intravenous  PONV Risk Score and Plan: 2 and Ondansetron, Dexamethasone and Midazolam  Airway Management Planned: Oral ETT  Additional Equipment: Arterial line  Intra-op Plan:   Post-operative Plan: Extubation in OR  Informed Consent: I have reviewed the patients History and Physical, chart, labs and discussed the procedure including the risks, benefits and alternatives for the proposed anesthesia with the patient or authorized representative who has indicated his/her understanding and acceptance.   Dental advisory given  Plan Discussed with: CRNA  Anesthesia Plan Comments:        Anesthesia Quick Evaluation

## 2016-10-20 NOTE — CV Procedure (Signed)
     Transesophageal Echocardiogram Note  [name] [id number] [birth year]  Procedure: Transesophageal Echocardiogram Indications: pre flutter ablation  Procedure Details Consent: Obtained Time Out: Verified patient identification, verified procedure, site/side was marked, verified correct patient position, special equipment/implants available, Radiology Safety Procedures followed,  medications/allergies/relevent history reviewed, required imaging and test results available.  Performed  Medications: During this procedure the patient is administered a total of Versed 4 mg and Fentanyl 50 mg to achieve and maintain moderate conscious sedation.  The patient's heart rate, blood pressure, and oxygen saturation are monitored continuously during the procedure. The period of conscious sedation is 35 minutes, of which I was present face-to-face 100% of this time.   There is severe LVH most prominent in the basal septum measuring   24 mm. There is dynamic LVOT obstruction and suggestion of a   possible subaortic membrane with peak/mean gradients 52/25 mmHg.   A TTE is recommended for further evaluation of gradients and a CT   for evaluation of a possible subaortic membrane.     Aortic valve has fixed calcified right coronary cusp - unchanged   from the prior study in 2013. There is at least moderate aortic   regurgitation, a TTE would evaluate further.     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     No thrombus is seen in the left atrium or appendage.   Complications: No apparent complications Patient did tolerate procedure well.  [name], MD, [facility] 10/20/2016, 10:05 AM

## 2016-10-21 ENCOUNTER — Encounter (HOSPITAL_COMMUNITY)
Admission: RE | Disposition: A | Discharge status: Home / Self Care | Payer: Self-pay | Source: Ambulatory Visit | Attending: Internal Medicine

## 2016-10-21 ENCOUNTER — Ambulatory Visit (HOSPITAL_COMMUNITY): Payer: Medicare Other | Admitting: Certified Registered Nurse Anesthetist

## 2016-10-21 ENCOUNTER — Inpatient Hospital Stay (HOSPITAL_COMMUNITY)
Admission: RE | Admit: 2016-10-21 | Discharge: 2016-10-23 | DRG: 244 | Disposition: A | Discharge status: Home / Self Care | Payer: Medicare Other | Source: Ambulatory Visit | Attending: Internal Medicine | Admitting: Internal Medicine

## 2016-10-21 ENCOUNTER — Encounter (HOSPITAL_COMMUNITY): Payer: Self-pay | Admitting: Cardiology

## 2016-10-21 DIAGNOSIS — E785 Hyperlipidemia, unspecified: Secondary | ICD-10-CM | POA: Diagnosis not present

## 2016-10-21 DIAGNOSIS — I9581 Postprocedural hypotension: Secondary | ICD-10-CM | POA: Diagnosis not present

## 2016-10-21 DIAGNOSIS — Z91048 Other nonmedicinal substance allergy status: Secondary | ICD-10-CM

## 2016-10-21 DIAGNOSIS — I481 Persistent atrial fibrillation: Secondary | ICD-10-CM | POA: Diagnosis not present

## 2016-10-21 DIAGNOSIS — Z7901 Long term (current) use of anticoagulants: Secondary | ICD-10-CM

## 2016-10-21 DIAGNOSIS — E7800 Pure hypercholesterolemia, unspecified: Secondary | ICD-10-CM | POA: Diagnosis not present

## 2016-10-21 DIAGNOSIS — I447 Left bundle-branch block, unspecified: Secondary | ICD-10-CM | POA: Diagnosis present

## 2016-10-21 DIAGNOSIS — I252 Old myocardial infarction: Secondary | ICD-10-CM

## 2016-10-21 DIAGNOSIS — I471 Supraventricular tachycardia, unspecified: Secondary | ICD-10-CM | POA: Diagnosis present

## 2016-10-21 DIAGNOSIS — I482 Chronic atrial fibrillation: Secondary | ICD-10-CM | POA: Diagnosis not present

## 2016-10-21 DIAGNOSIS — Z79899 Other long term (current) drug therapy: Secondary | ICD-10-CM

## 2016-10-21 DIAGNOSIS — I2510 Atherosclerotic heart disease of native coronary artery without angina pectoris: Secondary | ICD-10-CM | POA: Diagnosis not present

## 2016-10-21 DIAGNOSIS — R002 Palpitations: Secondary | ICD-10-CM | POA: Diagnosis present

## 2016-10-21 DIAGNOSIS — T8859XA Other complications of anesthesia, initial encounter: Secondary | ICD-10-CM | POA: Diagnosis not present

## 2016-10-21 DIAGNOSIS — Z888 Allergy status to other drugs, medicaments and biological substances status: Secondary | ICD-10-CM

## 2016-10-21 DIAGNOSIS — I4892 Unspecified atrial flutter: Secondary | ICD-10-CM | POA: Diagnosis present

## 2016-10-21 DIAGNOSIS — Z880 Allergy status to penicillin: Secondary | ICD-10-CM

## 2016-10-21 DIAGNOSIS — D649 Anemia, unspecified: Secondary | ICD-10-CM | POA: Diagnosis present

## 2016-10-21 DIAGNOSIS — R011 Cardiac murmur, unspecified: Secondary | ICD-10-CM | POA: Diagnosis present

## 2016-10-21 DIAGNOSIS — I4891 Unspecified atrial fibrillation: Secondary | ICD-10-CM | POA: Diagnosis present

## 2016-10-21 DIAGNOSIS — K219 Gastro-esophageal reflux disease without esophagitis: Secondary | ICD-10-CM | POA: Diagnosis present

## 2016-10-21 DIAGNOSIS — I119 Hypertensive heart disease without heart failure: Secondary | ICD-10-CM | POA: Diagnosis not present

## 2016-10-21 DIAGNOSIS — I484 Atypical atrial flutter: Secondary | ICD-10-CM | POA: Diagnosis not present

## 2016-10-21 DIAGNOSIS — Z8249 Family history of ischemic heart disease and other diseases of the circulatory system: Secondary | ICD-10-CM

## 2016-10-21 DIAGNOSIS — Z950 Presence of cardiac pacemaker: Secondary | ICD-10-CM

## 2016-10-21 HISTORY — PX: BIV PACEMAKER INSERTION CRT-P: EP1199

## 2016-10-21 HISTORY — DX: Presence of cardiac pacemaker: Z95.0

## 2016-10-21 HISTORY — PX: AV NODE ABLATION: SHX1209

## 2016-10-21 HISTORY — PX: AV NODE ABLATION: EP1193

## 2016-10-21 HISTORY — PX: BI-VENTRICULAR PACEMAKER INSERTION (CRT-P): SHX5750

## 2016-10-21 LAB — GLUCOSE, CAPILLARY: GLUCOSE-CAPILLARY: 160 mg/dL — AB (ref 65–99)

## 2016-10-21 SURGERY — AV NODE ABLATION
Anesthesia: General

## 2016-10-21 MED ORDER — SIMETHICONE 80 MG PO CHEW
80.0000 mg | CHEWABLE_TABLET | Freq: Four times a day (QID) | ORAL | Status: DC | PRN
  Administered 2016-10-22 – 2016-10-23 (×3): 80 mg via ORAL
  Filled 2016-10-21 (×3): qty 1

## 2016-10-21 MED ORDER — BUPIVACAINE HCL (PF) 0.25 % IJ SOLN
INTRAMUSCULAR | Status: AC
  Filled 2016-10-21: qty 30

## 2016-10-21 MED ORDER — PROPOFOL 10 MG/ML IV BOLUS
INTRAVENOUS | Status: DC | PRN
  Administered 2016-10-21: 100 mg via INTRAVENOUS

## 2016-10-21 MED ORDER — LIDOCAINE HCL (PF) 1 % IJ SOLN
INTRAMUSCULAR | Status: AC
  Filled 2016-10-21: qty 30

## 2016-10-21 MED ORDER — IMIQUIMOD 5 % EX CREA
1.0000 | TOPICAL_CREAM | Freq: Every day | CUTANEOUS | Status: DC
Start: 2016-10-21 — End: 2016-10-21

## 2016-10-21 MED ORDER — POLYETHYL GLYCOL-PROPYL GLYCOL 0.4-0.3 % OP SOLN
1.0000 [drp] | Freq: Two times a day (BID) | OPHTHALMIC | Status: DC
Start: 2016-10-21 — End: 2016-10-21

## 2016-10-21 MED ORDER — VITAMIN C 500 MG PO TABS
500.0000 mg | ORAL_TABLET | Freq: Two times a day (BID) | ORAL | Status: DC
  Administered 2016-10-21 – 2016-10-23 (×4): 500 mg via ORAL
  Filled 2016-10-21 (×4): qty 1

## 2016-10-21 MED ORDER — PHENYLEPHRINE HCL 10 MG/ML IJ SOLN
INTRAMUSCULAR | Status: DC | PRN
  Administered 2016-10-21 (×4): 80 ug via INTRAVENOUS

## 2016-10-21 MED ORDER — SODIUM CHLORIDE 0.9 % IV SOLN
INTRAVENOUS | Status: DC
  Administered 2016-10-21 – 2016-10-23 (×5): via INTRAVENOUS

## 2016-10-21 MED ORDER — SODIUM CHLORIDE 0.9 % IR SOLN
Status: DC | PRN
  Administered 2016-10-21: 12:00:00

## 2016-10-21 MED ORDER — HEPARIN (PORCINE) IN NACL 2-0.9 UNIT/ML-% IJ SOLN
INTRAMUSCULAR | Status: AC | PRN
  Administered 2016-10-21: 500 mL

## 2016-10-21 MED ORDER — ONDANSETRON HCL 4 MG/2ML IJ SOLN
INTRAMUSCULAR | Status: DC | PRN
  Administered 2016-10-21: 4 mg via INTRAVENOUS

## 2016-10-21 MED ORDER — DIPHENHYDRAMINE HCL 25 MG PO CAPS: 25.0000 mg | ORAL_CAPSULE | Freq: Every day | ORAL | Status: DC | PRN

## 2016-10-21 MED ORDER — POLYVINYL ALCOHOL 1.4 % OP SOLN
1.0000 [drp] | Freq: Two times a day (BID) | OPHTHALMIC | Status: DC
  Administered 2016-10-21 – 2016-10-23 (×2): 1 [drp] via OPHTHALMIC
  Filled 2016-10-21 (×3): qty 15

## 2016-10-21 MED ORDER — ALBUTEROL SULFATE (2.5 MG/3ML) 0.083% IN NEBU
3.0000 mL | INHALATION_SOLUTION | Freq: Four times a day (QID) | RESPIRATORY_TRACT | Status: DC | PRN
Start: 2016-10-21 — End: 2016-10-23

## 2016-10-21 MED ORDER — COENZYME Q10 300 MG PO CAPS: 1.0000 | ORAL_CAPSULE | Freq: Every morning | ORAL | Status: DC

## 2016-10-21 MED ORDER — DEXAMETHASONE SODIUM PHOSPHATE 10 MG/ML IJ SOLN
INTRAMUSCULAR | Status: DC | PRN
  Administered 2016-10-21: 10 mg via INTRAVENOUS

## 2016-10-21 MED ORDER — ALBUMIN HUMAN 5 % IV SOLN
12.5000 g | Freq: Once | INTRAVENOUS | Status: AC
  Administered 2016-10-21: 12.5 g via INTRAVENOUS
  Filled 2016-10-21: qty 250

## 2016-10-21 MED ORDER — GLUCOSAMINE HCL 1000 MG PO TABS: 1000.0000 mg | ORAL_TABLET | Freq: Two times a day (BID) | ORAL | Status: DC

## 2016-10-21 MED ORDER — FENTANYL CITRATE (PF) 100 MCG/2ML IJ SOLN
INTRAMUSCULAR | Status: DC | PRN
  Administered 2016-10-21: 100 ug via INTRAVENOUS

## 2016-10-21 MED ORDER — SODIUM CHLORIDE 0.9 % IR SOLN
Status: AC
  Filled 2016-10-21: qty 2

## 2016-10-21 MED ORDER — PRAVASTATIN SODIUM 40 MG PO TABS
40.0000 mg | ORAL_TABLET | Freq: Every day | ORAL | Status: DC
  Administered 2016-10-21 – 2016-10-22 (×2): 40 mg via ORAL
  Filled 2016-10-21 (×2): qty 1

## 2016-10-21 MED ORDER — VANCOMYCIN HCL IN DEXTROSE 1-5 GM/200ML-% IV SOLN
1000.0000 mg | Freq: Two times a day (BID) | INTRAVENOUS | Status: AC
  Administered 2016-10-21: 1000 mg via INTRAVENOUS
  Filled 2016-10-21: qty 200

## 2016-10-21 MED ORDER — LIDOCAINE HCL (PF) 1 % IJ SOLN
INTRAMUSCULAR | Status: DC | PRN
  Administered 2016-10-21: 45 mL

## 2016-10-21 MED ORDER — LIDOCAINE HCL (CARDIAC) 20 MG/ML IV SOLN
INTRAVENOUS | Status: DC | PRN
  Administered 2016-10-21: 60 mg via INTRAVENOUS

## 2016-10-21 MED ORDER — ACETAMINOPHEN 325 MG PO TABS
325.0000 mg | ORAL_TABLET | ORAL | Status: DC | PRN
  Administered 2016-10-21 – 2016-10-22 (×2): 650 mg via ORAL
  Filled 2016-10-21 (×2): qty 2

## 2016-10-21 MED ORDER — MIDAZOLAM HCL 5 MG/5ML IJ SOLN
INTRAMUSCULAR | Status: DC | PRN
  Administered 2016-10-21: 1 mg via INTRAVENOUS

## 2016-10-21 MED ORDER — ONDANSETRON HCL 4 MG/2ML IJ SOLN: 4.0000 mg | Freq: Four times a day (QID) | INTRAMUSCULAR | Status: DC | PRN

## 2016-10-21 MED ORDER — FERROUS SULFATE 325 (65 FE) MG PO TABS
325.0000 mg | ORAL_TABLET | Freq: Every day | ORAL | Status: DC
  Administered 2016-10-22 – 2016-10-23 (×2): 325 mg via ORAL
  Filled 2016-10-21 (×2): qty 1

## 2016-10-21 MED ORDER — IOPAMIDOL (ISOVUE-370) INJECTION 76%
INTRAVENOUS | Status: AC
  Filled 2016-10-21: qty 50

## 2016-10-21 MED ORDER — ROCURONIUM BROMIDE 100 MG/10ML IV SOLN
INTRAVENOUS | Status: DC | PRN
  Administered 2016-10-21: 50 mg via INTRAVENOUS

## 2016-10-21 MED ORDER — SUGAMMADEX SODIUM 200 MG/2ML IV SOLN
INTRAVENOUS | Status: DC | PRN
  Administered 2016-10-21: 150 mg via INTRAVENOUS

## 2016-10-21 MED ORDER — BUPIVACAINE HCL (PF) 0.25 % IJ SOLN
INTRAMUSCULAR | Status: DC | PRN
  Administered 2016-10-21: 60 mL

## 2016-10-21 MED ORDER — HEPARIN (PORCINE) IN NACL 2-0.9 UNIT/ML-% IJ SOLN
INTRAMUSCULAR | Status: AC
  Filled 2016-10-21: qty 500

## 2016-10-21 MED ORDER — PHENYLEPHRINE HCL 10 MG/ML IJ SOLN
INTRAVENOUS | Status: DC | PRN
  Administered 2016-10-21: 25 ug/min via INTRAVENOUS

## 2016-10-21 MED ORDER — B COMPLEX PO TABS: 1.0000 | ORAL_TABLET | Freq: Every day | ORAL | Status: DC

## 2016-10-21 MED ORDER — VANCOMYCIN HCL IN DEXTROSE 1-5 GM/200ML-% IV SOLN
INTRAVENOUS | Status: AC
  Filled 2016-10-21: qty 200

## 2016-10-21 MED ORDER — VITAMIN E 180 MG (400 UNIT) PO CAPS
400.0000 [IU] | ORAL_CAPSULE | Freq: Every day | ORAL | Status: DC
Start: 2016-10-22 — End: 2016-10-23
  Administered 2016-10-22 – 2016-10-23 (×2): 400 [IU] via ORAL
  Filled 2016-10-21 (×2): qty 1

## 2016-10-21 MED ORDER — ENSURE ENLIVE PO LIQD
237.0000 mL | Freq: Two times a day (BID) | ORAL | Status: DC
  Administered 2016-10-22 (×2): 237 mL via ORAL

## 2016-10-21 MED ORDER — BILBERRY 1000 MG PO CAPS: 1000.0000 mg | ORAL_CAPSULE | Freq: Every day | ORAL | Status: DC

## 2016-10-21 MED ORDER — METOPROLOL SUCCINATE ER 25 MG PO TB24
25.0000 mg | ORAL_TABLET | Freq: Two times a day (BID) | ORAL | Status: DC
  Filled 2016-10-21 (×2): qty 1

## 2016-10-21 MED ORDER — SAW PALMETTO (SERENOA REPENS) 160 MG PO CAPS: 160.0000 mg | ORAL_CAPSULE | Freq: Two times a day (BID) | ORAL | Status: DC

## 2016-10-21 MED ORDER — HEPARIN (PORCINE) IN NACL 2-0.9 UNIT/ML-% IJ SOLN
INTRAMUSCULAR | Status: AC | PRN
  Administered 2016-10-21 (×2): 500 mL

## 2016-10-21 MED ORDER — VANCOMYCIN HCL IN DEXTROSE 1-5 GM/200ML-% IV SOLN
1000.0000 mg | Freq: Once | INTRAVENOUS | Status: AC
  Administered 2016-10-21: 1000 mg via INTRAVENOUS

## 2016-10-21 MED ORDER — ADULT MULTIVITAMIN W/MINERALS CH
1.0000 | ORAL_TABLET | Freq: Every morning | ORAL | Status: DC
  Administered 2016-10-22 – 2016-10-23 (×2): 1 via ORAL
  Filled 2016-10-21 (×2): qty 1

## 2016-10-21 SURGICAL SUPPLY — 29 items
ADAPTER SEALING SSA-EW-09 (MISCELLANEOUS) ×1 IMPLANT
ADPR INTRO LNG 9FR SL XTD WNG (MISCELLANEOUS) ×1
BAG SNAP BAND KOVER 36X36 (MISCELLANEOUS) ×1 IMPLANT
CABLE SURGICAL S-101-97-12 (CABLE) ×2 IMPLANT
CATH ATTAIN COM SUR 6250V-MB2X (CATHETERS) ×2 IMPLANT
CATH ATTAIN SEL SURV 6248V-130 (CATHETERS) ×1 IMPLANT
CATH EZ STEER NAV 4MM F-J CUR (ABLATOR) ×1 IMPLANT
CATH HEX JOSEPH 2-5-2 65CM 6F (CATHETERS) ×1 IMPLANT
CATH JOSEPHSON QUAD-ALLRED 6FR (CATHETERS) ×1 IMPLANT
CATH RIGHTSITE C315HIS02 (CATHETERS) ×1 IMPLANT
DEVICE CRTP PERCEPTA QUAD MRI (Pacemaker) ×1 IMPLANT
GUIDEWIRE ANGLED .035X150CM (WIRE) ×1 IMPLANT
LEAD ATTAIN PERFORM ST 4398-88 (Lead) ×1 IMPLANT
LEAD CAPSURE NOVUS 5076-52CM (Lead) ×1 IMPLANT
LEAD SELECT SECURE 3830 383069 (Lead) IMPLANT
PACK EP LATEX FREE (CUSTOM PROCEDURE TRAY) ×2
PACK EP LF (CUSTOM PROCEDURE TRAY) IMPLANT
PAD DEFIB LIFELINK (PAD) ×1 IMPLANT
PATCH CARTO3 (PAD) ×1 IMPLANT
SELECT SECURE 3830 383069 (Lead) ×2 IMPLANT
SHEATH CLASSIC 7F (SHEATH) ×2 IMPLANT
SHEATH CLASSIC 9.5F (SHEATH) ×1 IMPLANT
SHEATH PINNACLE 6F 10CM (SHEATH) ×1 IMPLANT
SHEATH PINNACLE 8F 10CM (SHEATH) ×2 IMPLANT
SHIELD RADPAD SCOOP 12X17 (MISCELLANEOUS) ×1 IMPLANT
SLITTER 6232ADJ (MISCELLANEOUS) ×1 IMPLANT
TRAY PACEMAKER INSERTION (CUSTOM PROCEDURE TRAY) ×1 IMPLANT
WIRE ACUITY WHISPER EDS 4648 (WIRE) ×1 IMPLANT
WIRE MAILMAN 182CM (WIRE) ×2 IMPLANT

## 2016-10-21 NOTE — H&P (Signed)
HPI Mr. Hinderman returns today for followup. He is a pleasant 76 yo man with atrial flutter and LBBB, and HCM who underwent EP study and catheter ablation several weeks ago. He did well for about 2 weeks after the procedure but developed recurrent palpitations and was found to be back in atrial flutter. He has been on amiodarone. He had extensive scarring demonstrated in the RA during the last ablation. He had clockwise flutter. He is not as symptomatic as he was during the past. He notes fatigue and weakness however.       Allergies  Allergen Reactions  . Adhesive [Tape] Itching    The patches on back, when he had his ablasion  . Brilinta [Ticagrelor] Itching and Rash  . Penicillins Rash    Has patient had a PCN reaction causing immediate rash, facial/tongue/throat swelling, SOB or lightheadedness with hypotension: Yes Has patient had a PCN reaction causing severe rash involving mucus membranes or skin necrosis: Yes Has patient had a PCN reaction that required hospitalization No Has patient had a PCN reaction occurring within the last 10 years: No If all of the above answers are "NO", then may proceed with Cephalosporin use.            Current Outpatient Prescriptions  Medication Sig Dispense Refill  . albuterol (PROVENTIL HFA;VENTOLIN HFA) 108 (90 Base) MCG/ACT inhaler Inhale 1-2 puffs into the lungs every 6 (six) hours as needed for wheezing or shortness of breath. 1 Inhaler 0  . amiodarone (PACERONE) 200 MG tablet Take 1 tablet (200 mg total) by mouth daily. 180 tablet 3  . apixaban (ELIQUIS) 5 MG TABS tablet Take 5 mg by mouth 2 (two) times daily.    . Ascorbic Acid (VITAMIN C) 500 MG tablet Take 500 mg by mouth 2 (two) times daily.      Marland Kitchen b complex vitamins tablet Take 1 tablet by mouth daily.    . Bilberry 1000 MG CAPS Take 1 capsule by mouth daily.    . Cholecalciferol (VITAMIN D3 PO) Take 1 capsule by mouth daily.    . clotrimazole (LOTRIMIN) 1  % cream Apply 1 application topically 2 (two) times daily. Apply to affected area as directed    . Coenzyme Q10 (EQL COQ10) 300 MG CAPS Take 1 capsule by mouth every morning.     . Ferrous Sulfate (IRON) 325 (65 Fe) MG TABS Take 1 tablet by mouth daily.    . Glucosamine HCl 1000 MG TABS Take 1 tablet by mouth 2 (two) times daily.    Marland Kitchen HAWTHORN BERRY PO Take 1 capsule by mouth every morning.     Marland Kitchen HORSE CHESTNUT PO Take 1 capsule by mouth daily.    Marland Kitchen ipratropium (ATROVENT) 0.06 % nasal spray 2 sprays into each nostril 4 times a day as needed for runny nose. 15 mL 12  . metoprolol succinate (TOPROL XL) 25 MG 24 hr tablet Take 1 tablet (25 mg total) by mouth 2 (two) times daily. 60 tablet 3  . Multiple Vitamin (MULTIVITAMIN WITH MINERALS) TABS Take 1 tablet by mouth every morning.     . Omega-3 Fatty Acids (FISH OIL) 1000 MG CAPS Take 1 capsule by mouth daily.     Vladimir Faster Glycol-Propyl Glycol (SYSTANE) 0.4-0.3 % SOLN Place 1 drop into both eyes daily.    . pravastatin (PRAVACHOL) 40 MG tablet Take 40 mg by mouth at bedtime.     . ranitidine (ZANTAC) 75 MG tablet Take 75  mg by mouth 2 (two) times daily.    . saw palmetto 160 MG capsule Take 160 mg by mouth 2 (two) times daily.     . vitamin E 400 UNIT capsule Take 400 Units by mouth daily.     Marland Kitchen VITAMIN K PO Take 1 capsule by mouth daily.     No current facility-administered medications for this visit.          Past Medical History:  Diagnosis Date  . Anemia   . Arthritis   . Chronic lower back pain    "last 3 months; usually when I bend" (12/24/2011)  . Coronary artery disease   . Endocarditis   . GERD (gastroesophageal reflux disease)   . Heart murmur   . High cholesterol   . Hypertension   . Migraines    "none in the last couple years" (12/24/2011)  . Myocardial infarction [facility])    POSSIBLE 2012 ELEVATED ENZYMES  . Pneumonia ~ 2011    ROS:   All systems reviewed and negative  except as noted in the HPI.        Past Surgical History:  Procedure Laterality Date  . A-FLUTTER ABLATION N/A 08/12/2016   Procedure: A-Flutter Ablation;  Surgeon: Evans Lance, MD;  Location: [facility];  Service: Cardiovascular;  Laterality: N/A;  . CARDIOVERSION N/A 10/29/2015   Procedure: CARDIOVERSION;  Surgeon: Adrian Prows, MD;  Location: [facility] ENDOSCOPY;  Service: Cardiovascular;  Laterality: N/A;  . COLON RESECTION  12/25/2011   Procedure: COLON RESECTION LAPAROSCOPIC;  Surgeon: Shann Medal, MD;  Location: [place];  Service: General;  Laterality: Right;  laparoscopic assisted right colon resection  . COLONOSCOPY  11/11/2011   Procedure: COLONOSCOPY;  Surgeon: Missy Sabins, MD;  Location: [place];  Service: Endoscopy;  Laterality: N/A;  . CORONARY ANGIOPLASTY WITH STENT PLACEMENT  11/11/2010   "1"  . CYSTECTOMY  ~ 1980   right eyelid  . TEE WITHOUT CARDIOVERSION  11/06/2011   Procedure: TRANSESOPHAGEAL ECHOCARDIOGRAM (TEE);  Surgeon: Laverda Page, MD;  Location: [facility] ENDOSCOPY;  Service: Cardiovascular;  Laterality: N/A;          Family History  Problem Relation Age of Onset  . Hypertension Mother   . Heart failure Father   . Cancer Neg Hx   . Diabetes Neg Hx   . Hyperlipidemia Neg Hx   . Sudden death Neg Hx   . Stroke Neg Hx   . Heart attack Neg Hx      Social History        Social History  . Marital status: Married    Spouse name: N/A  . Number of children: N/A  . Years of education: N/A      Occupational History  . Not on file.         Social History Main Topics  . Smoking status: Never Smoker  . Smokeless tobacco: Never Used     Comment: 12/24/2011 "used to puff cigarettes; never inhaled"  . Alcohol use No  . Drug use: No  . Sexual activity: No       Other Topics Concern  . Not on file      Social History Narrative  . No narrative on file     BP 134/82   Pulse (!) 110   Ht 6' (1.829 m)    Wt 189 lb 6.4 oz (85.9 kg)   SpO2 96%   BMI 25.69 kg/m   Physical Exam:  Well appearing  NAD HEENT: Unremarkable Neck:  No JVD, no thyromegally Lymphatics:  No adenopathy Back:  No CVA tenderness Lungs:  Clear HEART:  Regular rate rhythm, no murmurs, no rubs, no clicks Abd:  soft, positive bowel sounds, no organomegally, no rebound, no guarding Ext:  2 plus pulses, no edema, no cyanosis, no clubbing Skin:  No rashes no nodules Neuro:  CN II through XII intact, motor grossly intact  EKG - atypical atrial flutter with a RVR  Assess/Plan: 1. Atrial flutter - he has had a recurrent of atypical atrial flutter. It is unclear if this represents atypical isthmus dependent flutter or left atrial flutter. He feels poorly. I have discussed the treatment options. We have decided to proceed with EP study and ablation of flutter. We will use anesthesia and CARTO and ICE. Will plan TEE before the procedure in case I need to go transeptal and ablate in the LA. He has been therapeutically anti-coagulated.  2. LBBB - I have also discussed AV node ablation and his bundle biv pacing. Will hope to ablate his flutter. 3. HCM - he has not had syncope.  [name].D

## 2016-10-21 NOTE — Transfer of Care (Signed)
Immediate Anesthesia Transfer of Care Note  Patient: [name]  Procedure(s) Performed: Procedure(s): AV Node Ablation (N/A) BiV Pacemaker Insertion CRT-P (N/A)  Patient Location: Cath Lab  Anesthesia Type:General  Level of Consciousness: awake, oriented and patient cooperative  Airway & Oxygen Therapy: Patient Spontanous Breathing and Patient connected to nasal cannula oxygen  Post-op Assessment: Report given to RN, Post -op Vital signs reviewed and stable and Patient moving all extremities  Post vital signs: Reviewed and stable  Last Vitals:  Vitals:   10/21/16 0629  BP: (!) 142/80  Pulse: 99  Temp: 36.6 C    Last Pain:  Vitals:   10/21/16 0629  TempSrc: Oral      Patients Stated Pain Goal: 3 (95/28/41 3244)  Complications: No apparent anesthesia complications

## 2016-10-21 NOTE — Progress Notes (Addendum)
Site area: RFV x 3 Site Prior to Removal:  Level 0 Pressure Applied For:20 MIN Manual:   yes Patient Status During Pull: stable  Post Pull Site:  Level 0 Post Pull Instructions Given:yes   Post Pull Pulses Present: palpable Dressing Applied:  tegaderm Bedrest begins @ 1400 TILL 2000 Comments:

## 2016-10-21 NOTE — Anesthesia Procedure Notes (Signed)
Procedure Name: Intubation Date/Time: 10/21/2016 9:16 AM Performed by: Candis Shine Pre-anesthesia Checklist: Patient identified, Emergency Drugs available, Suction available and Patient being monitored Patient Re-evaluated:Patient Re-evaluated prior to induction Oxygen Delivery Method: Circle System Utilized Preoxygenation: Pre-oxygenation with 100% oxygen Induction Type: IV induction Ventilation: Mask ventilation without difficulty Laryngoscope Size: Mac and 4 Grade View: Grade III Tube type: Oral Tube size: 7.5 mm Number of attempts: 1 Airway Equipment and Method: Bougie stylet Placement Confirmation: ETT inserted through vocal cords under direct vision,  positive ETCO2 and breath sounds checked- equal and bilateral Secured at: 22 cm Tube secured with: Tape Dental Injury: Teeth and Oropharynx as per pre-operative assessment  Difficulty Due To: Difficult Airway- due to anterior larynx

## 2016-10-21 NOTE — Anesthesia Procedure Notes (Signed)
Arterial Line Insertion Start/End7/18/2018 8:00 AM, 10/21/2016 8:10 AM Performed by: Candis Shine, CRNA  Patient location: Pre-op. Preanesthetic checklist: patient identified, IV checked, site marked, risks and benefits discussed, surgical consent, monitors and equipment checked, pre-op evaluation, timeout performed and anesthesia consent Lidocaine 1% used for infiltration radial was placed Catheter size: 20 G Hand hygiene performed  and maximum sterile barriers used   Attempts: 1 Procedure performed without using ultrasound guided technique. Following insertion, Biopatch and dressing applied. Post procedure assessment: normal  Patient tolerated the procedure well with no immediate complications.

## 2016-10-22 ENCOUNTER — Ambulatory Visit (HOSPITAL_COMMUNITY): Payer: Medicare Other

## 2016-10-22 ENCOUNTER — Encounter (HOSPITAL_COMMUNITY): Payer: Self-pay | Admitting: Internal Medicine

## 2016-10-22 DIAGNOSIS — I9581 Postprocedural hypotension: Secondary | ICD-10-CM | POA: Diagnosis not present

## 2016-10-22 DIAGNOSIS — Z8249 Family history of ischemic heart disease and other diseases of the circulatory system: Secondary | ICD-10-CM | POA: Diagnosis not present

## 2016-10-22 DIAGNOSIS — E7800 Pure hypercholesterolemia, unspecified: Secondary | ICD-10-CM | POA: Diagnosis present

## 2016-10-22 DIAGNOSIS — Z79899 Other long term (current) drug therapy: Secondary | ICD-10-CM | POA: Diagnosis not present

## 2016-10-22 DIAGNOSIS — I119 Hypertensive heart disease without heart failure: Secondary | ICD-10-CM | POA: Diagnosis present

## 2016-10-22 DIAGNOSIS — R002 Palpitations: Secondary | ICD-10-CM | POA: Diagnosis present

## 2016-10-22 DIAGNOSIS — E785 Hyperlipidemia, unspecified: Secondary | ICD-10-CM | POA: Diagnosis present

## 2016-10-22 DIAGNOSIS — R011 Cardiac murmur, unspecified: Secondary | ICD-10-CM | POA: Diagnosis present

## 2016-10-22 DIAGNOSIS — K219 Gastro-esophageal reflux disease without esophagitis: Secondary | ICD-10-CM | POA: Diagnosis present

## 2016-10-22 DIAGNOSIS — Z7901 Long term (current) use of anticoagulants: Secondary | ICD-10-CM | POA: Diagnosis not present

## 2016-10-22 DIAGNOSIS — I482 Chronic atrial fibrillation: Secondary | ICD-10-CM | POA: Diagnosis present

## 2016-10-22 DIAGNOSIS — Z91048 Other nonmedicinal substance allergy status: Secondary | ICD-10-CM | POA: Diagnosis not present

## 2016-10-22 DIAGNOSIS — D649 Anemia, unspecified: Secondary | ICD-10-CM | POA: Diagnosis present

## 2016-10-22 DIAGNOSIS — I481 Persistent atrial fibrillation: Secondary | ICD-10-CM | POA: Diagnosis not present

## 2016-10-22 DIAGNOSIS — I252 Old myocardial infarction: Secondary | ICD-10-CM | POA: Diagnosis not present

## 2016-10-22 DIAGNOSIS — T8859XA Other complications of anesthesia, initial encounter: Secondary | ICD-10-CM | POA: Diagnosis not present

## 2016-10-22 DIAGNOSIS — J90 Pleural effusion, not elsewhere classified: Secondary | ICD-10-CM | POA: Diagnosis not present

## 2016-10-22 DIAGNOSIS — Z950 Presence of cardiac pacemaker: Secondary | ICD-10-CM | POA: Diagnosis not present

## 2016-10-22 DIAGNOSIS — I484 Atypical atrial flutter: Secondary | ICD-10-CM | POA: Diagnosis present

## 2016-10-22 DIAGNOSIS — Z880 Allergy status to penicillin: Secondary | ICD-10-CM | POA: Diagnosis not present

## 2016-10-22 DIAGNOSIS — I2510 Atherosclerotic heart disease of native coronary artery without angina pectoris: Secondary | ICD-10-CM | POA: Diagnosis present

## 2016-10-22 DIAGNOSIS — J9811 Atelectasis: Secondary | ICD-10-CM | POA: Diagnosis not present

## 2016-10-22 DIAGNOSIS — I447 Left bundle-branch block, unspecified: Secondary | ICD-10-CM | POA: Diagnosis present

## 2016-10-22 DIAGNOSIS — Z888 Allergy status to other drugs, medicaments and biological substances status: Secondary | ICD-10-CM | POA: Diagnosis not present

## 2016-10-22 DIAGNOSIS — I4891 Unspecified atrial fibrillation: Secondary | ICD-10-CM | POA: Diagnosis present

## 2016-10-22 LAB — CBC
HCT: 29 % — ABNORMAL LOW (ref 39.0–52.0)
Hemoglobin: 9.2 g/dL — ABNORMAL LOW (ref 13.0–17.0)
MCH: 28.3 pg (ref 26.0–34.0)
MCHC: 31.7 g/dL (ref 30.0–36.0)
MCV: 89.2 fL (ref 78.0–100.0)
PLATELETS: 159 10*3/uL (ref 150–400)
RBC: 3.25 MIL/uL — AB (ref 4.22–5.81)
RDW: 16.6 % — AB (ref 11.5–15.5)
WBC: 5.9 10*3/uL (ref 4.0–10.5)

## 2016-10-22 MED ORDER — SODIUM CHLORIDE 0.9 % IV BOLUS (SEPSIS): 500.0000 mL | Freq: Once | INTRAVENOUS | Status: AC

## 2016-10-22 MED ORDER — SODIUM CHLORIDE 0.9 % IV SOLN
Freq: Once | INTRAVENOUS | Status: AC
Start: 2016-10-22 — End: 2016-10-22
  Administered 2016-10-22: 09:00:00 via INTRAVENOUS

## 2016-10-22 NOTE — Progress Notes (Signed)
Chaplain presented to the patient's room,andintroduced self, also inquired of the patient's wellbeing. He spoke of his recent medical procedures, and his hopefulness and it will provide the healing touch he needs. Chaplain also offered a word of prayer and encouragement of the patient's behalf.  Chaplain will follow up as needed. Chaplain Yaakov Guthrie [phone]

## 2016-10-22 NOTE — Anesthesia Postprocedure Evaluation (Signed)
Anesthesia Post Note  Patient: [name]  Procedure(s) Performed: Procedure(s) (LRB): AV Node Ablation (N/A) BiV Pacemaker Insertion CRT-P (N/A)     Patient location during evaluation: PACU Anesthesia Type: General Level of consciousness: awake and alert Pain management: pain level controlled Vital Signs Assessment: post-procedure vital signs reviewed and stable Respiratory status: spontaneous breathing, nonlabored ventilation and respiratory function stable Cardiovascular status: blood pressure returned to baseline and stable Postop Assessment: no signs of nausea or vomiting Anesthetic complications: no    Last Vitals:  Vitals:   10/22/16 1051 10/22/16 1100  BP:  (!) 82/67  Pulse:    Resp:    Temp: 36.8 C     Last Pain:  Vitals:   10/22/16 1051  TempSrc: Oral  PainSc:                  Jamieson Lisa,W. EDMOND

## 2016-10-22 NOTE — Progress Notes (Signed)
Cardiologist PA Renee notified of pt c/o of increased lt upper cp at site tylenol already given. Pt also c/o of sweating temp checked & noted to be 98.2 orally. Pt was put up into chair & stated he felt better. Pt also encouraged to eat. Wll continue to monitor the pt. Hoover Brunette, RN

## 2016-10-22 NOTE — Discharge Instructions (Signed)
Right groin site care No sexual activity for 1 week.  Keep procedure site clean & dry. If you notice increased pain, swelling, bleeding or pus, call/return! No soaking baths/hot tubs/pools for 1 week.        Supplemental Discharge Instructions for  Pacemaker/Defibrillator Patients  Activity No heavy lifting or vigorous activity with your left/right arm for 6 to 8 weeks.  Do not raise your left/right arm above your head for one week.  Gradually raise your affected arm as drawn below.             10/25/16                     10/26/16                    10/27/16                  10/28/16 __  NO DRIVING for 1 week  ; you may begin driving on  2/77/82 .  WOUND CARE - Keep the wound area clean and dry.  Do not get this area wet for one week. No showers for one week; you may shower on 10/28/16  . - The tape/steri-strips on your wound will fall off; do not pull them off.  No bandage is needed on the site.  DO  NOT apply any creams, oils, or ointments to the wound area. - If you notice any drainage or discharge from the wound, any swelling or bruising at the site, or you develop a fever > 101? F after you are discharged home, call the office at once.  Special Instructions - You are still able to use cellular telephones; use the ear opposite the side where you have your pacemaker/defibrillator.  Avoid carrying your cellular phone near your device. - When traveling through airports, show security personnel your identification card to avoid being screened in the metal detectors.  Ask the security personnel to use the hand wand. - Avoid arc welding equipment, MRI testing (magnetic resonance imaging), TENS units (transcutaneous nerve stimulators).  Call the office for questions about other devices. - Avoid electrical appliances that are in poor condition or are not properly grounded. - Microwave ovens are safe to be near or to operate.  Additional information for defibrillator patients should your  device go off: - If your device goes off ONCE and you feel fine afterward, notify the device clinic nurses. - If your device goes off ONCE and you do not feel well afterward, call 911. - If your device goes off TWICE, call 911. - If your device goes off THREE times in one day, call 911.  DO NOT DRIVE YOURSELF OR A FAMILY MEMBER WITH A DEFIBRILLATOR TO THE HOSPITAL--CALL 911.

## 2016-10-22 NOTE — Progress Notes (Signed)
Progress Note  Patient Name: [name] Date of Encounter: 10/22/2016  Primary Cardiologist: Dr. [name]  Subjective   Denies any CP or SOB, hasn't been OOB yet  Inpatient Medications    Scheduled Meds: . feeding supplement (ENSURE ENLIVE)  237 mL Oral BID BM  . ferrous sulfate  325 mg Oral Q breakfast  . multivitamin with minerals  1 tablet Oral q morning - 10a  . polyvinyl alcohol  1 drop Both Eyes BID  . pravastatin  40 mg Oral QHS  . vitamin C  500 mg Oral BID  . vitamin E  400 Units Oral Daily   Continuous Infusions: . sodium chloride 50 mL/hr at 10/22/16 0334   PRN Meds: acetaminophen, albuterol, diphenhydrAMINE, ondansetron (ZOFRAN) IV, simethicone   Vital Signs    Vitals:   10/22/16 0334 10/22/16 0340 10/22/16 0436 10/22/16 0800  BP: (!) 80/66 (!) 82/67 (!) 82/68 92/72  Pulse:   80   Resp:   16   Temp:   98 F (36.7 C)   TempSrc:   Oral   SpO2:   98% 98%  Weight:   190 lb 1.6 oz (86.2 kg)   Height:        Intake/Output Summary (Last 24 hours) at 10/22/16 0929 Last data filed at 10/22/16 0835  Gross per 24 hour  Intake             3020 ml  Output              840 ml  Net             2180 ml   Filed Weights   10/21/16 0629 10/21/16 1630 10/22/16 0436  Weight: 190 lb (86.2 kg) 190 lb (86.2 kg) 190 lb 1.6 oz (86.2 kg)    Telemetry    AFlutter, V paced - Personally Reviewed  ECG    AFlutter, Vpaced- Personally Reviewed  Physical Exam   GEN: No acute distress.  + pallor, skin is warm/dry Neck: No JVD Cardiac: RRR (paced), 1-2/6 SM, rubs, or gallops.  Respiratory: CTA b/l. GI: Soft, nontender, non-distended  MS: No edema; No deformity. extremities are warm Neuro:  Nonfocal  Psych: Normal affect  Pacer site has minimal ecchymosis, no hematoma  Labs     Radiology    Dg Chest 2 View Result Date: 10/22/2016 CLINICAL DATA:  Pacemaker placement EXAM: CHEST  2 VIEW COMPARISON:  Sep 01, 2014 FINDINGS: Pacemaker leads are attached to  the right atrium, right ventricle, and coronary sinus. No pneumothorax. There is a small left pleural effusion with left base atelectasis. Right lung is clear. Heart is mildly enlarged with pulmonary vascularity within normal limits. No appreciable adenopathy. No bone lesions. IMPRESSION: Pacemaker leads as described without pneumothorax. Left base atelectasis with small left pleural effusion. No edema or consolidation. Stable cardiac prominence. Electronically Signed   By: Lowella Grip III M.D.   On: 10/22/2016 08:02    Cardiac Studies   10/20/16: TEE Impressions: - There is severe LVH most prominent in the basal septum measuring   24 mm. There is dynamic LVOT obstruction and suggestion of a   possible subaortic membrane with peak/mean gradients 52/25 mmHg.   A TTE is recommended for further evaluation of gradients and a CT   for evaluation of a possible subaortic membrane.     Aortic valve has fixed calcified right coronary cusp - unchanged   from the prior study in 2013. There is at least moderate  aortic   regurgitation, a TTE would evaluate further.     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     No thrombus is seen in the left atrium or appendage.  Patient Profile     76 y.o. male HTN, HLD, PAFib/flutter/Atach, and ?CAD, severe LVH is s/p AVNode ablation and PPM implant yesterday with Dr. [name]  Assessment & Plan    1. Permanent AFib/flutter     S/p AV node ablation and PPM implant, post procedure day one today     Sites are stable     CXR is without ptx     Device check this morning with stable measurements and function     Plan to resume Eliquis Sunday  2. Persistent hypotension post procedure     Uncertain etiology, perhaps 2/2 general anesthesia     Patient denies any overt bleeding or signs of bleeding     Per-procedure H/H OK     Advance diet     Check H/H     Repeat fluid bolus this AM  ADDEND: Patient OOB to chair,  has eaten lunch and feeling well, still somewhat pale, though cheeks have better color.  Denies any significant pain at pacer site, tylenol has controlled this.  H/H down some, procedure sites look good, no symptoms or exam findings to suggest perforation.  His SBP stays about 90.  Will monitor today, repeat H/H in AM.   Discussed with the patient.       Signed, Baldwin [place], PA-C  10/22/2016, 9:29 AM    EP Attending  Patient seen and examined. Agree with above. The patient is a little hypotensive and looks weak and tired today after over 3 hours of general anesthesia. I will ask him to remain in hospital another day, get some IV fluids and increase his activity. Anticipate DC home on 7/20.   Mikle Bosworth.D.

## 2016-10-22 NOTE — Progress Notes (Signed)
Txtpaged Dr. [place] for pt BP continuing to trend down, even after the NS bolus. RN manual check 82/67. Pt reported soreness on incision site, which is expected. Pt. still complains of having clammy skin. No new orders at this time. Will monitor.

## 2016-10-22 NOTE — Progress Notes (Addendum)
txtpaged Dr. [place] d/t pt BP trending down. Received an order for 591ml NS bolus. MD also made aware of pt clammy skin, and left abdominal/rib pain. Given simethicone 80mg  for flatulence and tylenol for pain, per pt request. Will monitor.

## 2016-10-22 NOTE — Progress Notes (Signed)
Nutrition Brief Note  Patient identified on the Malnutrition Screening Tool (MST) Report. No significant weight loss noted recently. Nutrition-Focused physical exam completed. Findings are no fat depletion, no muscle depletion, and no edema.    Wt Readings from Last 15 Encounters:  10/22/16 190 lb 1.6 oz (86.2 kg)  10/20/16 195 lb (88.5 kg)  09/11/16 189 lb 6.4 oz (85.9 kg)  08/31/16 185 lb (83.9 kg)  08/12/16 185 lb (83.9 kg)  08/04/16 190 lb 9.6 oz (86.5 kg)  07/15/16 185 lb (83.9 kg)  06/26/16 183 lb (83 kg)  05/07/16 190 lb 9.6 oz (86.5 kg)  12/24/15 192 lb 9.6 oz (87.4 kg)  12/03/15 193 lb (87.5 kg)  11/19/15 192 lb (87.1 kg)  11/15/15 200 lb (90.7 kg)  10/31/15 200 lb (90.7 kg)  10/29/15 200 lb (90.7 kg)    Body mass index is 25.78 kg/m. Patient meets criteria for normal weight based on current BMI.   Diet order just advanced to heart healthy, patient tolerated liquids well at breakfast today. Labs and medications reviewed.   Will continue Ensure supplements BID as patient reports decreased appetite since admission. No further nutrition interventions warranted at this time. If nutrition issues arise, please consult RD.   Molli Barrows, RD, LDN, [place] Pager [phone] After Hours Pager [phone]

## 2016-10-23 LAB — HEMOGLOBIN AND HEMATOCRIT, BLOOD
HEMATOCRIT: 29.2 % — AB (ref 39.0–52.0)
Hemoglobin: 9.1 g/dL — ABNORMAL LOW (ref 13.0–17.0)

## 2016-10-23 NOTE — Discharge Summary (Signed)
ELECTROPHYSIOLOGY PROCEDURE DISCHARGE SUMMARY    Patient ID: [name],  MRN: [id number], DOB/AGE: 07/18/1940 76 y.o.  Admit date: 10/21/2016 Discharge date: 10/23/2016  Primary Care Physician: [name], MD  Primary Cardiologist: Dr. Einar Gip Electrophysiologist: Dr. [name]  Primary Discharge Diagnosis:  1. Permanent AFib/flutter     CHA2DS2Vasc is at least 2 on Eliquis 2. Difficult to control AF rates     S/p AV node ablation and PPM implant this admission       Secondary Discharge Diagnosis:  1. LVH 2. HLD  Allergies  Allergen Reactions  . Adhesive [Tape] Itching    The patches on back, when he had his ablasion  . Brilinta [Ticagrelor] Itching and Rash  . Penicillins Rash    Has patient had a PCN reaction causing immediate rash, facial/tongue/throat swelling, SOB or lightheadedness with hypotension: Yes Has patient had a PCN reaction causing severe rash involving mucus membranes or skin necrosis: Yes Has patient had a PCN reaction that required hospitalization No Has patient had a PCN reaction occurring within the last 10 years: No If all of the above answers are "NO", then may proceed with Cephalosporin use.      Procedures This Admission:  1. AV nod ablation, 10/21/16, Dr. [name]     Conclusion: Successful AV node ablation and placement of a biventricular pacemaker utilizing His bundle pacing for the RV lead 2..  Implantation of a MDT dual  chamber PPM on 10/21/16 by Dr [name].       Conclusion: Successful AV node ablation and placement of a biventricular pacemaker utilizing His bundle pacing for the RV lead.       There were no immediate post procedure complications. 2.  CXR on 10/22/16 demonstrated no pneumothorax status post device implantation.   Brief HPI: [name] is a 76 y.o. male was referred to electrophysiology in the outpatient setting for consideration of PPM implantation.  Past medical history includes HTN, HLD, PAFib/flutter/Atach,  and ?CAD, severe LVH  .  The patient has diffuclt to control rates with his AF and discussion on ablation options as well as AVnode and PPM implant, risks, benefits, and alternatives to all were reviewed with the patient who wished to proceed.   Hospital Course:  The patient was admitted and underwent AV node ablation and PPM implantation with details as noted above.  Post procedure (general anesthesia) he had persistent hypotension and was kept an additional day post procedure.  He was hydrated with IVF and his BP significantly improved and felt most likely 2/2 anesthesia, his H/H stable this morning and both procedure sites stable.  He has ambulated in the hallway without difficulty.  He has some peripheral edema from IF hydration and is instructed to ambulate as tolerated and keep LE elevated while seated and monitor this, if unresolved after a few day to let us know or if develops SOB. Hhe was monitored on telemetry overnight which demonstrated V paced rhythm at 90bpm.  Left chest was without hematoma or ecchymosis, R groin site stable without hematoma.  The device was interrogated post procedure day #1and found to be functioning normally.  CXR was also obtained post procedure and demonstrated no pneumothorax status post device implantation.  Wound care, arm mobility, and restrictions were reviewed with the patient.  The patient was examined by Dr. [name] and considered stable for discharge to home. We will stop his amiodarone, and given some soft BP here his Toprol as well.  His primary cardiologist  is Dr. Einar Gip, recommend he follow up with him his TEE findings and ongoing cardiac care.  EP and device follow up has been arranged.    Physical Exam: Vitals:   10/22/16 2120 10/23/16 0507 10/23/16 0511 10/23/16 0721  BP: (!) 97/57  105/72   Pulse: 88  89   Resp: 18     Temp: 97.8 F (36.6 C)  97.7 F (36.5 C)   TempSrc: Oral  Oral   SpO2: 95%  97% 97%  Weight:  190 lb 9.6 oz (86.5 kg)    Height:         GEN- The patient is well appearing, alert and oriented x 3 today.  Pallor has resolved HEENT: normocephalic, atraumatic; sclera clear, conjunctiva pink; hearing intact; oropharynx clear; neck supple, no JVP Lungs- CTA bl, normal work of breathing.  No wheezes, rales, rhonchi Heart- RRR (paced), no murmurs, rubs or gallops, PMI not laterally displaced GI- soft, non-tender, non-distended Extremities- no clubbing, cyanosis, or edema, R groin site remains stable MS- no significant deformity or atrophy Skin- warm and dry, no rash or lesion, left chest without hematoma/ecchymosis Psych- euthymic mood, full affect Neuro- no gross deficits   Labs:   Lab Results  Component Value Date   WBC 5.9 10/22/2016   HGB 9.1 (L) 10/23/2016   HCT 29.2 (L) 10/23/2016   MCV 89.2 10/22/2016   PLT 159 10/22/2016   No results for input(s): NA, K, CL, CO2, BUN, CREATININE, CALCIUM, PROT, BILITOT, ALKPHOS, ALT, AST, GLUCOSE in the last 168 hours.  Invalid input(s): LABALBU  Discharge Medications:  Allergies as of 10/23/2016      Reactions   Adhesive [tape] Itching   The patches on back, when he had his ablasion   Brilinta [ticagrelor] Itching, Rash   Penicillins Rash   Has patient had a PCN reaction causing immediate rash, facial/tongue/throat swelling, SOB or lightheadedness with hypotension: Yes Has patient had a PCN reaction causing severe rash involving mucus membranes or skin necrosis: Yes Has patient had a PCN reaction that required hospitalization No Has patient had a PCN reaction occurring within the last 10 years: No If all of the above answers are "NO", then may proceed with Cephalosporin use.      Medication List    STOP taking these medications   amiodarone 200 MG tablet Commonly known as:  PACERONE   metoprolol succinate 25 MG 24 hr tablet Commonly known as:  TOPROL XL   predniSONE 10 MG (21) Tbpk tablet Commonly known as:  STERAPRED UNI-PAK 21 TAB     TAKE these  medications   albuterol 108 (90 Base) MCG/ACT inhaler Commonly known as:  PROVENTIL HFA;VENTOLIN HFA Inhale 1-2 puffs into the lungs every 6 (six) hours as needed for wheezing or shortness of breath.   apixaban 5 MG Tabs tablet Commonly known as:  ELIQUIS Take 5 mg by mouth 2 (two) times daily. Notes to patient:  Do not resume until Sunday 10/25/16   b complex vitamins tablet Take 1 tablet by mouth daily.   Bilberry 1000 MG Caps Take 1,000 mg by mouth daily.   clotrimazole 1 % cream Commonly known as:  LOTRIMIN Apply 1 application topically daily as needed (rash). Apply to affected area as directed   diphenhydrAMINE 25 MG tablet Commonly known as:  BENADRYL Take 25 mg by mouth daily as needed for itching.   EQL COQ10 300 MG Caps Generic drug:  Coenzyme Q10 Take 1 capsule by mouth every morning.   Glucosamine  HCl 1000 MG Tabs Take 1,000 mg by mouth 2 (two) times daily.   HAWTHORN BERRY PO Take 1 capsule by mouth every morning.   HORSE CHESTNUT PO Take 1 capsule by mouth daily.   hydrocortisone cream 1 % Apply 1 application topically daily as needed for itching.   imiquimod 5 % cream Commonly known as:  ALDARA Apply 1 application topically at bedtime.   ipratropium 0.06 % nasal spray Commonly known as:  ATROVENT 2 sprays into each nostril 4 times a day as needed for runny nose.   Iron 325 (65 Fe) MG Tabs Take 325 mg by mouth daily.   multivitamin with minerals Tabs tablet Take 1 tablet by mouth every morning.   pravastatin 40 MG tablet Commonly known as:  PRAVACHOL Take 40 mg by mouth at bedtime.   ranitidine 75 MG tablet Commonly known as:  ZANTAC Take 75 mg by mouth 2 (two) times daily.   saw palmetto 160 MG capsule Take 160 mg by mouth 2 (two) times daily.   SYSTANE 0.4-0.3 % Soln Generic drug:  Polyethyl Glycol-Propyl Glycol Place 1 drop into both eyes 2 (two) times daily.   triamcinolone cream 0.1 % Commonly known as:  KENALOG Apply 1  application topically 2 (two) times daily.   vitamin C 500 MG tablet Commonly known as:  ASCORBIC ACID Take 500 mg by mouth 2 (two) times daily.   VITAMIN D PO Take 1 capsule by mouth daily.   vitamin E 400 UNIT capsule Take 400 Units by mouth daily.   VITAMIN K PO Take 1 capsule by mouth daily.       Disposition: Home Discharge Instructions    Diet - low sodium heart healthy    Complete by:  As directed    Increase activity slowly    Complete by:  As directed      Follow-up Information    Evans Lance, MD Follow up on 01/27/2017.   Specialty:  Cardiology Why:  11:30AM Contact information: [address] [address] [phone]        [facility] Follow up on 11/04/2016.   Specialty:  Cardiology Why:  10:00AM, wound check visit, pacemaker check Contact information: [address], [place] Follow up on 11/18/2016.   Specialty:  Cardiology Why:  11:00AM, pacemaker check Contact information: [address], [address] [place]       Adrian Prows, MD Follow up.   Specialty:  Cardiology Why:  Call and make a follow up appointment to see Dr. Einar Gip in the next 3-4 weeks. Contact information: [address] [place] [address] [phone]           Duration of Discharge Encounter: Greater than 30 minutes including physician time.  Venetia Night, PA-C 10/23/2016 10:04 AM  EP Attending  Patient seen and examined. Agree with above. The patient looks better today. BP improved. Minimal peripheral edema after fluid last 24 hours. Wound site looks good. Tele - atrial fib with ventricular pacing at 90/min. [employer] for DC home. Will stop amiodarone and his beta blocker now that rate is controlled and his atrial fib will be chronic. Usual followup.   Mikle Bosworth.D.

## 2016-10-23 NOTE — Plan of Care (Signed)
Problem: Physical Regulation: Goal: Ability to maintain clinical measurements within normal limits will improve Outcome: Adequate for Discharge Pt educated on arm restrictions as well as site care for his wrist & groin. Pt also educated on signs & symptoms of infection.

## 2016-10-26 ENCOUNTER — Telehealth: Payer: Self-pay | Admitting: Internal Medicine

## 2016-10-26 NOTE — Telephone Encounter (Signed)
Call received from Pt.  Pt states feels SOB and has increased edema in legs.  Per review of discharge report, Pt with soft BP's and received extra fluid while inpatient.  Outreach made to Dr. Einar Gip office, d/t Pt follow by Einar Gip for cardiac issues.  Spoke with April.  Per April she will call Pt and then follow up with Dr. Einar Gip.

## 2016-10-26 NOTE — Telephone Encounter (Signed)
New Message     Pt would like to speak with Dr [name] nurse about getting a new pacemaker

## 2016-10-27 NOTE — Telephone Encounter (Addendum)
Spoke with patient.  Patient received instructions from Dr. Einar Gip to apply compression stockings.  He reports that the [facility] and LE edema are somewhat improved.  Advised patient that we received "yellow" [facility]link alerts via his home monitor (dated 7/19 and 7/20, but just received today), but the Carelink system is down so we are unable to review the alerts.  Advised that I have contacted tech services and that they are working on a work-around right now and should have an update tomorrow morning.  Advised patient that if he experiences syncope, chest pain, or worsening ShOB, to call 911 for transport to the ED.  He verbalizes understanding of instructions.    Reviewed with Dr. [name], who advised that patient should be seen tomorrow in the office if we cannot review alerts.  Alerts successfully reviewed--AT/AF burden alerts, patient has chronic AF.  Dr. [name] also recommended patient f/u regarding ShOB and LE edema.  Patient's BPs have been running soft per triage note from yesterday.  Able to reach patient.  BP 106/72 earlier today, but all other checks have been in the 90s/60s.  No dizziness.  Patient is agreeable to next available APP appointment with Tommye Standard, PA on Thursday, 7/26 at 11:30am to discuss starting a diuretic per Dr. [name].  He is aware to call in the interim with any questions or concerns and is appreciative of assistance.

## 2016-10-28 NOTE — Progress Notes (Signed)
Cardiology Office Note Date:  10/28/2016  Patient ID:  [name], [name] 06/16/1940, MRN [id number] PCP:  [name], MD  Cardiologist:  Dr. [name]   Chief Complaint: palpitations  History of Present Illness: [name] is a 76 y.o. male with history of HTN, HLD, PAFib/flutter/Atach, and ?CAD, severe LVH, January had a short hospital stay with RAFib, earlier this month treated at the ED for a cough (s/p URI) treated with inhalers, not admitted.  He comes in today to be seen for Dr. [name], he has known multiple different atrial arrhythmias and atriopathy with difficult to rate control and is now s/p AV node ablation with PPM implant 10/21/16.  Post procedure (with general anesthesia) he had persistent hypotension, pallor and kept in  The hospital an additional day receiving IVF/hydration, H/H was down sone from pre-op labs though remained stable with stable procedure sites.  His BP did improve, pallor resolved, he ambulated and feeling well discharged.  Day of discharge was noted to have edema, though suspected with out of bed and elevation of LE would resolved.  He comes in today with ongoing DOE and LE swelling.  He will infrequently feel a little stick type pain, is fleeting lasting a second or so, yesterday seemed to be on/off for a couple minutes though had been using his L arm, more then he had been given had been at his week mark.  It is just inferior to his device, no CP otherwise, no pleuritic type pains, no rest SOB.  He has DOE that is the same perhaps slighty improved in comparison to the day of his discharge last week, his swelling remains the same, not increasing.  He says he is definitely not feeling any worse, and perhaps is improving some, but thought it would be resolving by now.  He notes when he elevates his leg it does go down, but as soon as he is up and around re-accumuates.  No dizziness, near syncope or syncope.  His BP today 94/62, recheck is 98/62, he says that  is about where his BP sits and on a couple occassions at home 100-116.  He denies any weak spells or feelings of fatigue outside of getting winded easily.  He denies any pain or complication at his groin procedure site.  He denies any bleeding, signs of bleeding.  Device information: MDT single chamber PPM implanted 10/21/16, Dr. [name] S/p AV node ablation   Past Medical History:  Diagnosis Date  . Anemia   . Arthritis    "little in my fingers" (10/21/2016)  . Chronic lower back pain    "last 3 months; usually when I bend" (12/24/2011)  . Coronary artery disease   . Endocarditis   . GERD (gastroesophageal reflux disease)   . Heart murmur   . High cholesterol   . Hypertension   . Migraines    "none in years" (10/21/2016)  . Myocardial infarction [facility])    POSSIBLE 2012 ELEVATED ENZYMES  . Pneumonia ~ 2011  . Presence of permanent cardiac pacemaker     Past Surgical History:  Procedure Laterality Date  . A-FLUTTER ABLATION N/A 08/12/2016   Procedure: A-Flutter Ablation;  Surgeon: Evans Lance, MD;  Location: [place] CV LAB;  Service: Cardiovascular;  Laterality: N/A;  . APPENDECTOMY    . AV NODE ABLATION  10/21/2016  . AV NODE ABLATION N/A 10/21/2016   Procedure: AV Node Ablation;  Surgeon: Evans Lance, MD;  Location: [place] CV LAB;  Service: Cardiovascular;  Laterality: N/A;  . BI-VENTRICULAR PACEMAKER INSERTION (CRT-P)  10/21/2016   BiV Pacemaker Insertion CRT-P  . BIV PACEMAKER INSERTION CRT-P N/A 10/21/2016   Procedure: BiV Pacemaker Insertion CRT-P;  Surgeon: Evans Lance, MD;  Location: [place] [facility];  Service: Cardiovascular;  Laterality: N/A;  . CARDIOVERSION N/A 10/29/2015   Procedure: CARDIOVERSION;  Surgeon: Adrian Prows, MD;  Location: [facility] ENDOSCOPY;  Service: Cardiovascular;  Laterality: N/A;  . COLON RESECTION  12/25/2011   Procedure: COLON RESECTION LAPAROSCOPIC;  Surgeon: Shann Medal, MD;  Location: [place];  Service: General;  Laterality: Right;   laparoscopic assisted right colon resection  . COLON SURGERY    . COLONOSCOPY  11/11/2011   Procedure: COLONOSCOPY;  Surgeon: Missy Sabins, MD;  Location: [place];  Service: Endoscopy;  Laterality: N/A;  . CORONARY ANGIOPLASTY WITH STENT PLACEMENT  11/11/2010   "1"  . CYSTECTOMY Right ~ 1980    eyelid  . INGUINAL HERNIA REPAIR Bilateral   . TEE WITHOUT CARDIOVERSION  11/06/2011   Procedure: TRANSESOPHAGEAL ECHOCARDIOGRAM (TEE);  Surgeon: Laverda Page, MD;  Location: [place];  Service: Cardiovascular;  Laterality: N/A;  . TEE WITHOUT CARDIOVERSION N/A 10/20/2016   Procedure: TRANSESOPHAGEAL ECHOCARDIOGRAM (TEE);  Surgeon: Dorothy Spark, MD;  [facility] ENDOSCOPY;  Service: Cardiovascular;  Laterality: N/A;    Current Outpatient Prescriptions  Medication Sig Dispense Refill  . albuterol (PROVENTIL HFA;VENTOLIN HFA) 108 (90 Base) MCG/ACT inhaler Inhale 1-2 puffs into the lungs every 6 (six) hours as needed for wheezing or shortness of breath. 1 Inhaler 0  . apixaban (ELIQUIS) 5 MG TABS tablet Take 5 mg by mouth 2 (two) times daily.    . Ascorbic Acid (VITAMIN C) 500 MG tablet Take 500 mg by mouth 2 (two) times daily.      Marland Kitchen b complex vitamins tablet Take 1 tablet by mouth daily.    . Bilberry 1000 MG CAPS Take 1,000 mg by mouth daily.     . Cholecalciferol (VITAMIN D PO) Take 1 capsule by mouth daily.    . clotrimazole (LOTRIMIN) 1 % cream Apply 1 application topically daily as needed (rash). Apply to affected area as directed     . Coenzyme Q10 (EQL COQ10) 300 MG CAPS Take 1 capsule by mouth every morning.     . diphenhydrAMINE (BENADRYL) 25 MG tablet Take 25 mg by mouth daily as needed for itching.    . Ferrous Sulfate (IRON) 325 (65 Fe) MG TABS Take 325 mg by mouth daily.     . Glucosamine HCl 1000 MG TABS Take 1,000 mg by mouth 2 (two) times daily.     Marland Kitchen HAWTHORN BERRY PO Take 1 capsule by mouth every morning.     Marland Kitchen HORSE CHESTNUT PO Take 1 capsule by mouth daily.    .  hydrocortisone cream 1 % Apply 1 application topically daily as needed for itching.    . imiquimod (ALDARA) 5 % cream Apply 1 application topically at bedtime.  1  . ipratropium (ATROVENT) 0.06 % nasal spray 2 sprays into each nostril 4 times a day as needed for runny nose. 15 mL 12  . Multiple Vitamin (MULTIVITAMIN WITH MINERALS) TABS Take 1 tablet by mouth every morning.     Vladimir Faster Glycol-Propyl Glycol (SYSTANE) 0.4-0.3 % SOLN Place 1 drop into both eyes 2 (two) times daily.     . pravastatin (PRAVACHOL) 40 MG tablet Take 40 mg by mouth at bedtime.     Marland Kitchen  ranitidine (ZANTAC) 75 MG tablet Take 75 mg by mouth 2 (two) times daily.    . saw palmetto 160 MG capsule Take 160 mg by mouth 2 (two) times daily.     Marland Kitchen triamcinolone cream (KENALOG) 0.1 % Apply 1 application topically 2 (two) times daily. 15 g 0  . vitamin E 400 UNIT capsule Take 400 Units by mouth daily.     Marland Kitchen VITAMIN K PO Take 1 capsule by mouth daily.     No current facility-administered medications for this visit.     Allergies:   Adhesive [tape]; Brilinta [ticagrelor]; and Penicillins   Social History:  The patient  reports that he has never smoked. He has never used smokeless tobacco. He reports that he drinks alcohol. He reports that he does not use drugs.   Family History:  The patient's family history includes Heart failure in his father; Hypertension in his mother.  ROS:  Please see the history of present illness. All other systems are reviewed and otherwise negative.   PHYSICAL EXAM:  VS:  There were no vitals taken for this visit. BMI: There is no height or weight on file to calculate BMI. Well nourished, well developed, in no acute distress  No palor is appreciated, he has good color HEENT: normocephalic, atraumatic  Neck: no JVD, carotid bruits or masses Cardiac:  RRR (paced); no significant murmurs, no rubs, or gallops Lungs:  slightly diminished at the bases b/l, no wheezing, rhonchi or rales  Abd: soft,  nontender MS: no deformity, age appropriate atrophy Ext: 1-2+ edema appears the same as last week Skin: warm and dry, no rash Neuro:  No gross deficits appreciated Psych: euthymic mood, full affect  PPM site: steri strips remain in place, old ecchymosis towards L axilla, no hematoma, dry, no erythema, increased temp to surrounding tissues is appreciated, appears to be healing well   EKG:  Done 10/22/16 Aflutter, V paced PPM check today by industry: battery and lead measurements look good, acute implant settings/outputs remain  10/20/16: TEE Study Conclusions - Left ventricle: Wall thickness was increased in a pattern of   severe LVH most dominant in the basal septum. Systolic function   was normal. The estimated ejection fraction was in the range of   60% to 65%. Wall motion was normal; there were no regional wall   motion abnormalities. - Aortic valve: Trileaflet with severely thickened, calcified right   coronary leaflet that is fixed. There was moderate stenosis.   There was moderate regurgitation. Mean gradient (S): 25 mm Hg.   Peak gradient (S): 52 mm Hg. - Aorta: There was mild non-mobile atheroma. - Aortic root: The aortic root was not dilated. - Descending aorta: The descending aorta was normal in size. - Mitral valve: There was moderate regurgitation. - Left atrium: The atrium was dilated. No evidence of thrombus in   the atrial cavity or appendage. No evidence of thrombus in the   atrial cavity or appendage. - Right ventricle: The cavity size was normal. Wall thickness was   normal. Systolic function was normal. - Right atrium: No evidence of thrombus in the atrial cavity or   appendage. - Atrial septum: There was increased thickness of the septum,   consistent with lipomatous hypertrophy. - Tricuspid valve: There was mild regurgitation. Impressions: - There is severe LVH most prominent in the basal septum measuring   24 mm. There is dynamic LVOT obstruction and  suggestion of a   possible subaortic membrane with peak/mean gradients 52/25  mmHg.   A TTE is recommended for further evaluation of gradients and a CT   for evaluation of a possible subaortic membrane.  10/18/14: TTE Severe concentric LVH, grade II DD, LVEF 56%, LA severely dilated (58cm),  small secudum ASD probably due to stretched out septum Mild AI/AS Mild ca++ MV with SAM, mod MR Mild-mod TR PASP 18mmHg  01/25/15: stress myoview No ischemia, , EF 21%, low risk study  Recent Labs: 05/06/2016: Magnesium 2.3 07/15/2016: ALT 31; TSH 2.010 10/13/2016: BUN 18; Creatinine, Ser 1.39; Potassium 4.6; Sodium 138 10/22/2016: Platelets 159 10/23/2016: Hemoglobin 9.1  No results found for requested labs within last 8760 hours.   Estimated Creatinine Clearance: 51.2 mL/min (A) (by C-G formula based on SCr of 1.39 mg/dL (H)).   Wt Readings from Last 3 Encounters:  10/23/16 190 lb 9.6 oz (86.5 kg)  10/20/16 195 lb (88.5 kg)  09/11/16 189 lb 6.4 oz (85.9 kg)     Other studies reviewed: Additional studies/records reviewed today include: summarized above  ASSESSMENT AND PLAN:   1. Paroxysmal Afib, flutter, Atach, unable to rate control now is s/p AV node ablation and PPM     CHA2DS2Vasc is at least 2 on Eliquis     Stable device measurements  2. LVH, Diastolic dysfunction     TEE as above, ? LVOT obstruction   3. HTN     Remains on low side, asymptomatic, per the patient, SBP has been about 98-100   5. ASD on echo     Small secundum ASD  6. Fluid OL, diastolic CHF     he reports waxing/waning edema over the years, never peristant like this     He got IVF with boluses post op day likely provoking   Disposition: Discussed with DOD today, given his BP need for diuresis, will start lasix 20mg  daily, plan for BMET next week at his wound check visit.  He is instructed to make follow up with Dr. Einar Gip in the next 2-3 weeks (was unable to see him this week, reports he was out of town, he  will be here next week for wound check/lab and 2 weeks after for another pacer programming.  He is instructed to let us know how he is doing/responding to the lasix and keep an eye on his BP, should he develop any new or worsening symptoms to get attention/seen.    Current medicines are reviewed at length with the patient today.  The patient did not have any concerns regarding medicines.  Haywood Lasso, PA-C 10/28/2016 6:24 AM     [place] [address] [phone] (office)  [phone] (fax)

## 2016-10-29 ENCOUNTER — Ambulatory Visit (INDEPENDENT_AMBULATORY_CARE_PROVIDER_SITE_OTHER): Payer: Medicare Other | Admitting: Physician Assistant

## 2016-10-29 VITALS — BP 94/62 | HR 90 | Ht 72.0 in | Wt 197.0 lb

## 2016-10-29 DIAGNOSIS — I5043 Acute on chronic combined systolic (congestive) and diastolic (congestive) heart failure: Secondary | ICD-10-CM

## 2016-10-29 DIAGNOSIS — I517 Cardiomegaly: Secondary | ICD-10-CM

## 2016-10-29 DIAGNOSIS — Z79899 Other long term (current) drug therapy: Secondary | ICD-10-CM

## 2016-10-29 DIAGNOSIS — I481 Persistent atrial fibrillation: Secondary | ICD-10-CM

## 2016-10-29 DIAGNOSIS — Z950 Presence of cardiac pacemaker: Secondary | ICD-10-CM

## 2016-10-29 DIAGNOSIS — I4819 Other persistent atrial fibrillation: Secondary | ICD-10-CM

## 2016-10-29 MED ORDER — FUROSEMIDE 20 MG PO TABS: 20.0000 mg | ORAL_TABLET | Freq: Every day | ORAL | 3 refills | Status: DC

## 2016-10-29 NOTE — Patient Instructions (Addendum)
Medication Instructions:   START TAKING LASIX 20 MG ONCE A DAY   If you need a refill on your cardiac medications before your next appointment, please call your pharmacy.  Labwork: BMET NEXT WEEK SAME DAY AS WOUND CHECK ON 11-18-16    Testing/Procedures: NONE ORDERED  TODAY    Follow-Up:  AS SCHEDULED ALREADY WITH [address]    FOLLOW UP WITH DR Nadyne Coombes IN 2 TO 3 WEEKS   Any Other Special Instructions Will Be Listed Below (If Applicable).

## 2016-11-04 ENCOUNTER — Encounter: Payer: Self-pay | Admitting: Physician Assistant

## 2016-11-04 ENCOUNTER — Other Ambulatory Visit: Payer: Medicare Other | Admitting: *Deleted

## 2016-11-04 ENCOUNTER — Ambulatory Visit (INDEPENDENT_AMBULATORY_CARE_PROVIDER_SITE_OTHER): Payer: Medicare Other | Admitting: *Deleted

## 2016-11-04 DIAGNOSIS — Z79899 Other long term (current) drug therapy: Secondary | ICD-10-CM

## 2016-11-04 DIAGNOSIS — Z950 Presence of cardiac pacemaker: Secondary | ICD-10-CM | POA: Diagnosis not present

## 2016-11-04 DIAGNOSIS — I5043 Acute on chronic combined systolic (congestive) and diastolic (congestive) heart failure: Secondary | ICD-10-CM | POA: Diagnosis not present

## 2016-11-04 DIAGNOSIS — I481 Persistent atrial fibrillation: Secondary | ICD-10-CM | POA: Diagnosis not present

## 2016-11-04 DIAGNOSIS — I4819 Other persistent atrial fibrillation: Secondary | ICD-10-CM

## 2016-11-04 LAB — CUP PACEART INCLINIC DEVICE CHECK
Battery Remaining Longevity: 43 mo
Battery Voltage: 3.07 V
Brady Statistic AP VP Percent: 0 %
Implantable Lead Implant Date: 20180718
Implantable Lead Location: 753858
Implantable Lead Model: 3830
Implantable Lead Model: 5076
Lead Channel Impedance Value: 342 Ohm
Lead Channel Impedance Value: 551 Ohm
Lead Channel Impedance Value: 722 Ohm
Lead Channel Impedance Value: 722 Ohm
Lead Channel Pacing Threshold Pulse Width: 1 ms
Lead Channel Pacing Threshold Pulse Width: 1 ms
Lead Channel Sensing Intrinsic Amplitude: 9.75 mV
Lead Channel Setting Pacing Amplitude: 3.5 V
Lead Channel Setting Pacing Pulse Width: 1 ms
Lead Channel Setting Pacing Pulse Width: 1 ms
MDC IDC LEAD IMPLANT DT: 20180718
MDC IDC LEAD IMPLANT DT: 20180718
MDC IDC LEAD LOCATION: 753859
MDC IDC LEAD LOCATION: 753860
MDC IDC MSMT LEADCHNL LV IMPEDANCE VALUE: 608 Ohm
MDC IDC MSMT LEADCHNL LV IMPEDANCE VALUE: 627 Ohm
MDC IDC MSMT LEADCHNL LV IMPEDANCE VALUE: 627 Ohm
MDC IDC MSMT LEADCHNL LV PACING THRESHOLD AMPLITUDE: 1.25 V
MDC IDC MSMT LEADCHNL RA IMPEDANCE VALUE: 475 Ohm
MDC IDC MSMT LEADCHNL RA SENSING INTR AMPL: 5 mV
MDC IDC MSMT LEADCHNL RV IMPEDANCE VALUE: 323 Ohm
MDC IDC MSMT LEADCHNL RV IMPEDANCE VALUE: 437 Ohm
MDC IDC MSMT LEADCHNL RV PACING THRESHOLD AMPLITUDE: 0.75 V
MDC IDC PG IMPLANT DT: 20180718
MDC IDC SESS DTM: 20180801141015
MDC IDC SET LEADCHNL RA PACING AMPLITUDE: 3.5 V
MDC IDC SET LEADCHNL RV PACING AMPLITUDE: 3.5 V
MDC IDC SET LEADCHNL RV SENSING SENSITIVITY: 8 mV
MDC IDC STAT BRADY AP VS PERCENT: 0 %
MDC IDC STAT BRADY AS VP PERCENT: 0 %
MDC IDC STAT BRADY AS VS PERCENT: 0 %
MDC IDC STAT BRADY RA PERCENT PACED: 0.17 %
MDC IDC STAT BRADY RV PERCENT PACED: 99.66 %

## 2016-11-04 LAB — BASIC METABOLIC PANEL
BUN / CREAT RATIO: 11 (ref 10–24)
BUN: 17 mg/dL (ref 8–27)
CO2: 22 mmol/L (ref 20–29)
CREATININE: 1.5 mg/dL — AB (ref 0.76–1.27)
Calcium: 8.9 mg/dL (ref 8.6–10.2)
Chloride: 99 mmol/L (ref 96–106)
GFR calc Af Amer: 52 mL/min/{1.73_m2} — ABNORMAL LOW (ref >59)
GFR, EST NON AFRICAN AMERICAN: 45 mL/min/{1.73_m2} — AB (ref >59)
GLUCOSE: 119 mg/dL — AB (ref 65–99)
POTASSIUM: 3.9 mmol/L (ref 3.5–5.2)
SODIUM: 142 mmol/L (ref 134–144)

## 2016-11-04 NOTE — Progress Notes (Signed)
Wound check appointment. Steri-strips removed. Wound without redness or edema. Healing ecchymosis noted near L axilla. Pinhole opening noted along medial incision, stitch removed, covered with antibiotic ointment and bandaid. Patient instructed to keep in place x24hrs and to call if any signs/symptoms of infection noted, or if additional stitch noted. Incision edges otherwise approximated and wound healing well.   Normal device function. Thresholds, sensing, and impedances consistent with implant measurements. RV (His) capture appears septal until LOC at 0.5V @ 1.67ms. Device programmed at 3.5V (RV and LV capture management reprogrammed to monitor only, RA on adaptive) until 3 month visit. Histogram distribution blunted due to DDD 90 pacing. Received verbal order from AS to reprogram base rate to 80bpm (s/p AVN ablation) and to enable rate response. Patient reports increased activity tolerance while walking the hall after these changes. 100% AT/AF burden since implant, mode maintained DDD per industry recommendations--pending review by GT. No high ventricular rates noted. Max LV pacing lead impedance decreased to 2000ohms. Patient educated about wound care, arm mobility, lifting restrictions, and Carelink monitor. ROV with [facility] on 11/18/16 to decrease base pacing rate to 70bpm, ROV with GT on 01/27/17.

## 2016-11-06 DIAGNOSIS — Z950 Presence of cardiac pacemaker: Secondary | ICD-10-CM | POA: Diagnosis not present

## 2016-11-06 DIAGNOSIS — Z9889 Other specified postprocedural states: Secondary | ICD-10-CM | POA: Diagnosis not present

## 2016-11-06 DIAGNOSIS — I2510 Atherosclerotic heart disease of native coronary artery without angina pectoris: Secondary | ICD-10-CM | POA: Diagnosis not present

## 2016-11-06 DIAGNOSIS — I480 Paroxysmal atrial fibrillation: Secondary | ICD-10-CM | POA: Diagnosis not present

## 2016-11-18 ENCOUNTER — Other Ambulatory Visit: Payer: Medicare Other

## 2016-11-18 ENCOUNTER — Ambulatory Visit (INDEPENDENT_AMBULATORY_CARE_PROVIDER_SITE_OTHER): Payer: Medicare Other | Admitting: *Deleted

## 2016-11-18 DIAGNOSIS — Z950 Presence of cardiac pacemaker: Secondary | ICD-10-CM | POA: Diagnosis not present

## 2016-11-18 DIAGNOSIS — I4819 Other persistent atrial fibrillation: Secondary | ICD-10-CM

## 2016-11-18 DIAGNOSIS — I481 Persistent atrial fibrillation: Secondary | ICD-10-CM | POA: Diagnosis not present

## 2016-11-18 DIAGNOSIS — I5043 Acute on chronic combined systolic (congestive) and diastolic (congestive) heart failure: Secondary | ICD-10-CM | POA: Diagnosis not present

## 2016-11-19 LAB — CUP PACEART INCLINIC DEVICE CHECK
Battery Remaining Longevity: 48 mo
Battery Voltage: 3.05 V
Brady Statistic RA Percent Paced: 0.46 %
Brady Statistic RV Percent Paced: 99.97 %
Date Time Interrogation Session: 20180815151229
Implantable Lead Location: 753859
Implantable Lead Model: 3830
Implantable Lead Model: 4398
Implantable Pulse Generator Implant Date: 20180718
Lead Channel Impedance Value: 380 Ohm
Lead Channel Impedance Value: 475 Ohm
Lead Channel Impedance Value: 779 Ohm
Lead Channel Setting Pacing Pulse Width: 1 ms
Lead Channel Setting Sensing Sensitivity: 8 mV
MDC IDC LEAD IMPLANT DT: 20180718
MDC IDC LEAD IMPLANT DT: 20180718
MDC IDC LEAD IMPLANT DT: 20180718
MDC IDC LEAD LOCATION: 753858
MDC IDC LEAD LOCATION: 753860
MDC IDC MSMT LEADCHNL LV IMPEDANCE VALUE: 703 Ohm
MDC IDC MSMT LEADCHNL LV IMPEDANCE VALUE: 779 Ohm
MDC IDC MSMT LEADCHNL LV IMPEDANCE VALUE: 798 Ohm
MDC IDC MSMT LEADCHNL LV IMPEDANCE VALUE: 893 Ohm
MDC IDC MSMT LEADCHNL LV IMPEDANCE VALUE: 912 Ohm
MDC IDC MSMT LEADCHNL RA IMPEDANCE VALUE: 361 Ohm
MDC IDC MSMT LEADCHNL RA IMPEDANCE VALUE: 437 Ohm
MDC IDC SET LEADCHNL LV PACING AMPLITUDE: 3.5 V
MDC IDC SET LEADCHNL LV PACING PULSEWIDTH: 1 ms
MDC IDC SET LEADCHNL RA PACING AMPLITUDE: 3.5 V
MDC IDC SET LEADCHNL RV PACING AMPLITUDE: 3.5 V
MDC IDC STAT BRADY AP VP PERCENT: 0 %
MDC IDC STAT BRADY AP VS PERCENT: 0 %
MDC IDC STAT BRADY AS VP PERCENT: 0 %
MDC IDC STAT BRADY AS VS PERCENT: 0 %

## 2016-11-19 NOTE — Progress Notes (Signed)
Pacemaker check in clinic to reprogram base rate to 70bpm, s/p AVN ablation. Wound well healed. No sensing/threshold testing performed. 100% AT/AF, +Eliquis. No high ventricular rates noted. BiV pacing 100%. Lower rate reprogrammed to 70bpm. Patient feels rate response (enabled last check) is too aggressive, denies any symptoms but checks his HR frequently with pulse oximeter and notes HRs as high as 114bpm during walks. HR reached 110bpm with walk in office prior to changes. Activity threshold reprogrammed to medium low, ADL response reduced to 2, exertion response reduced to 2. Walked with patient in office again--HR reached 94bpm. Patient reports this "feels better." He agrees to call if he notes any symptoms of chest discomfort or increased ShOB with exertion. Patient education completed. ROV with GT on 01/27/17.

## 2016-11-24 ENCOUNTER — Encounter: Payer: Medicare Other | Admitting: Internal Medicine

## 2016-12-03 DIAGNOSIS — I480 Paroxysmal atrial fibrillation: Secondary | ICD-10-CM | POA: Diagnosis not present

## 2016-12-03 DIAGNOSIS — Z950 Presence of cardiac pacemaker: Secondary | ICD-10-CM | POA: Diagnosis not present

## 2016-12-03 DIAGNOSIS — I2510 Atherosclerotic heart disease of native coronary artery without angina pectoris: Secondary | ICD-10-CM | POA: Diagnosis not present

## 2016-12-03 DIAGNOSIS — Z9889 Other specified postprocedural states: Secondary | ICD-10-CM | POA: Diagnosis not present

## 2016-12-04 DIAGNOSIS — Z45010 Encounter for checking and testing of cardiac pacemaker pulse generator [battery]: Secondary | ICD-10-CM | POA: Diagnosis not present

## 2016-12-04 DIAGNOSIS — Z950 Presence of cardiac pacemaker: Secondary | ICD-10-CM | POA: Diagnosis not present

## 2016-12-16 DIAGNOSIS — D649 Anemia, unspecified: Secondary | ICD-10-CM | POA: Diagnosis not present

## 2016-12-16 DIAGNOSIS — E559 Vitamin D deficiency, unspecified: Secondary | ICD-10-CM | POA: Diagnosis not present

## 2016-12-16 DIAGNOSIS — D638 Anemia in other chronic diseases classified elsewhere: Secondary | ICD-10-CM | POA: Diagnosis not present

## 2016-12-16 DIAGNOSIS — D509 Iron deficiency anemia, unspecified: Secondary | ICD-10-CM | POA: Diagnosis not present

## 2016-12-16 DIAGNOSIS — E785 Hyperlipidemia, unspecified: Secondary | ICD-10-CM | POA: Diagnosis not present

## 2016-12-16 DIAGNOSIS — I10 Essential (primary) hypertension: Secondary | ICD-10-CM | POA: Diagnosis not present

## 2016-12-23 DIAGNOSIS — I4891 Unspecified atrial fibrillation: Secondary | ICD-10-CM | POA: Diagnosis not present

## 2016-12-23 DIAGNOSIS — D509 Iron deficiency anemia, unspecified: Secondary | ICD-10-CM | POA: Diagnosis not present

## 2016-12-23 DIAGNOSIS — I10 Essential (primary) hypertension: Secondary | ICD-10-CM | POA: Diagnosis not present

## 2016-12-23 DIAGNOSIS — I2510 Atherosclerotic heart disease of native coronary artery without angina pectoris: Secondary | ICD-10-CM | POA: Diagnosis not present

## 2016-12-23 DIAGNOSIS — Z125 Encounter for screening for malignant neoplasm of prostate: Secondary | ICD-10-CM | POA: Diagnosis not present

## 2016-12-30 ENCOUNTER — Other Ambulatory Visit: Payer: Self-pay | Admitting: Gastroenterology

## 2016-12-30 DIAGNOSIS — I519 Heart disease, unspecified: Secondary | ICD-10-CM | POA: Diagnosis not present

## 2016-12-30 DIAGNOSIS — D509 Iron deficiency anemia, unspecified: Secondary | ICD-10-CM | POA: Diagnosis not present

## 2016-12-30 DIAGNOSIS — R142 Eructation: Secondary | ICD-10-CM | POA: Diagnosis not present

## 2016-12-30 DIAGNOSIS — D126 Benign neoplasm of colon, unspecified: Secondary | ICD-10-CM | POA: Diagnosis not present

## 2017-01-14 DIAGNOSIS — I480 Paroxysmal atrial fibrillation: Secondary | ICD-10-CM | POA: Diagnosis not present

## 2017-01-14 DIAGNOSIS — Z950 Presence of cardiac pacemaker: Secondary | ICD-10-CM | POA: Diagnosis not present

## 2017-01-14 DIAGNOSIS — I422 Other hypertrophic cardiomyopathy: Secondary | ICD-10-CM | POA: Diagnosis not present

## 2017-01-14 DIAGNOSIS — Z9889 Other specified postprocedural states: Secondary | ICD-10-CM | POA: Diagnosis not present

## 2017-01-27 ENCOUNTER — Encounter: Payer: Medicare Other | Admitting: Internal Medicine

## 2017-01-29 DIAGNOSIS — R195 Other fecal abnormalities: Secondary | ICD-10-CM | POA: Diagnosis not present

## 2017-01-29 DIAGNOSIS — R12 Heartburn: Secondary | ICD-10-CM | POA: Diagnosis not present

## 2017-01-29 DIAGNOSIS — D509 Iron deficiency anemia, unspecified: Secondary | ICD-10-CM | POA: Diagnosis not present

## 2017-01-29 DIAGNOSIS — Z980 Intestinal bypass and anastomosis status: Secondary | ICD-10-CM | POA: Diagnosis not present

## 2017-01-29 DIAGNOSIS — K648 Other hemorrhoids: Secondary | ICD-10-CM | POA: Diagnosis not present

## 2017-01-29 DIAGNOSIS — R634 Abnormal weight loss: Secondary | ICD-10-CM | POA: Diagnosis not present

## 2017-01-29 DIAGNOSIS — K2930 Chronic superficial gastritis without bleeding: Secondary | ICD-10-CM | POA: Diagnosis not present

## 2017-02-01 ENCOUNTER — Inpatient Hospital Stay (HOSPITAL_COMMUNITY)
Admission: EM | Admit: 2017-02-01 | Discharge: 2017-02-26 | DRG: 377 | Disposition: A | Discharge status: Skilled Nursing Facility | Payer: Medicare Other | Attending: Internal Medicine | Admitting: Internal Medicine

## 2017-02-01 ENCOUNTER — Encounter (HOSPITAL_COMMUNITY): Payer: Self-pay | Admitting: *Deleted

## 2017-02-01 DIAGNOSIS — I480 Paroxysmal atrial fibrillation: Secondary | ICD-10-CM | POA: Diagnosis not present

## 2017-02-01 DIAGNOSIS — I422 Other hypertrophic cardiomyopathy: Secondary | ICD-10-CM | POA: Diagnosis present

## 2017-02-01 DIAGNOSIS — J440 Chronic obstructive pulmonary disease with acute lower respiratory infection: Secondary | ICD-10-CM | POA: Diagnosis not present

## 2017-02-01 DIAGNOSIS — E7800 Pure hypercholesterolemia, unspecified: Secondary | ICD-10-CM | POA: Diagnosis present

## 2017-02-01 DIAGNOSIS — R0602 Shortness of breath: Secondary | ICD-10-CM | POA: Diagnosis not present

## 2017-02-01 DIAGNOSIS — D509 Iron deficiency anemia, unspecified: Secondary | ICD-10-CM | POA: Diagnosis present

## 2017-02-01 DIAGNOSIS — J811 Chronic pulmonary edema: Secondary | ICD-10-CM

## 2017-02-01 DIAGNOSIS — I447 Left bundle-branch block, unspecified: Secondary | ICD-10-CM | POA: Diagnosis present

## 2017-02-01 DIAGNOSIS — G8929 Other chronic pain: Secondary | ICD-10-CM | POA: Diagnosis present

## 2017-02-01 DIAGNOSIS — I083 Combined rheumatic disorders of mitral, aortic and tricuspid valves: Secondary | ICD-10-CM | POA: Diagnosis present

## 2017-02-01 DIAGNOSIS — T462X5A Adverse effect of other antidysrhythmic drugs, initial encounter: Secondary | ICD-10-CM | POA: Diagnosis not present

## 2017-02-01 DIAGNOSIS — D696 Thrombocytopenia, unspecified: Secondary | ICD-10-CM | POA: Diagnosis not present

## 2017-02-01 DIAGNOSIS — I2721 Secondary pulmonary arterial hypertension: Secondary | ICD-10-CM | POA: Diagnosis present

## 2017-02-01 DIAGNOSIS — I110 Hypertensive heart disease with heart failure: Secondary | ICD-10-CM | POA: Diagnosis present

## 2017-02-01 DIAGNOSIS — I351 Nonrheumatic aortic (valve) insufficiency: Secondary | ICD-10-CM | POA: Diagnosis not present

## 2017-02-01 DIAGNOSIS — Z955 Presence of coronary angioplasty implant and graft: Secondary | ICD-10-CM

## 2017-02-01 DIAGNOSIS — Z515 Encounter for palliative care: Secondary | ICD-10-CM

## 2017-02-01 DIAGNOSIS — I130 Hypertensive heart and chronic kidney disease with heart failure and stage 1 through stage 4 chronic kidney disease, or unspecified chronic kidney disease: Secondary | ICD-10-CM | POA: Diagnosis present

## 2017-02-01 DIAGNOSIS — Z880 Allergy status to penicillin: Secondary | ICD-10-CM

## 2017-02-01 DIAGNOSIS — J189 Pneumonia, unspecified organism: Secondary | ICD-10-CM | POA: Diagnosis not present

## 2017-02-01 DIAGNOSIS — K648 Other hemorrhoids: Secondary | ICD-10-CM | POA: Diagnosis present

## 2017-02-01 DIAGNOSIS — Y92239 Unspecified place in hospital as the place of occurrence of the external cause: Secondary | ICD-10-CM | POA: Diagnosis not present

## 2017-02-01 DIAGNOSIS — I2510 Atherosclerotic heart disease of native coronary artery without angina pectoris: Secondary | ICD-10-CM | POA: Diagnosis present

## 2017-02-01 DIAGNOSIS — J810 Acute pulmonary edema: Secondary | ICD-10-CM | POA: Diagnosis not present

## 2017-02-01 DIAGNOSIS — I421 Obstructive hypertrophic cardiomyopathy: Secondary | ICD-10-CM | POA: Diagnosis not present

## 2017-02-01 DIAGNOSIS — I252 Old myocardial infarction: Secondary | ICD-10-CM | POA: Diagnosis not present

## 2017-02-01 DIAGNOSIS — M545 Low back pain: Secondary | ICD-10-CM | POA: Diagnosis present

## 2017-02-01 DIAGNOSIS — N390 Urinary tract infection, site not specified: Secondary | ICD-10-CM | POA: Diagnosis not present

## 2017-02-01 DIAGNOSIS — K219 Gastro-esophageal reflux disease without esophagitis: Secondary | ICD-10-CM | POA: Diagnosis present

## 2017-02-01 DIAGNOSIS — R570 Cardiogenic shock: Secondary | ICD-10-CM | POA: Diagnosis not present

## 2017-02-01 DIAGNOSIS — E871 Hypo-osmolality and hyponatremia: Secondary | ICD-10-CM | POA: Diagnosis not present

## 2017-02-01 DIAGNOSIS — E861 Hypovolemia: Secondary | ICD-10-CM | POA: Diagnosis not present

## 2017-02-01 DIAGNOSIS — I4892 Unspecified atrial flutter: Secondary | ICD-10-CM | POA: Diagnosis present

## 2017-02-01 DIAGNOSIS — D500 Iron deficiency anemia secondary to blood loss (chronic): Secondary | ICD-10-CM | POA: Diagnosis present

## 2017-02-01 DIAGNOSIS — E785 Hyperlipidemia, unspecified: Secondary | ICD-10-CM | POA: Diagnosis present

## 2017-02-01 DIAGNOSIS — I051 Rheumatic mitral insufficiency: Secondary | ICD-10-CM | POA: Diagnosis not present

## 2017-02-01 DIAGNOSIS — R54 Age-related physical debility: Secondary | ICD-10-CM | POA: Diagnosis present

## 2017-02-01 DIAGNOSIS — K921 Melena: Secondary | ICD-10-CM | POA: Diagnosis present

## 2017-02-01 DIAGNOSIS — I5033 Acute on chronic diastolic (congestive) heart failure: Secondary | ICD-10-CM | POA: Diagnosis present

## 2017-02-01 DIAGNOSIS — I9589 Other hypotension: Secondary | ICD-10-CM | POA: Diagnosis not present

## 2017-02-01 DIAGNOSIS — R079 Chest pain, unspecified: Secondary | ICD-10-CM | POA: Diagnosis not present

## 2017-02-01 DIAGNOSIS — I359 Nonrheumatic aortic valve disorder, unspecified: Secondary | ICD-10-CM | POA: Diagnosis not present

## 2017-02-01 DIAGNOSIS — R0689 Other abnormalities of breathing: Secondary | ICD-10-CM

## 2017-02-01 DIAGNOSIS — K922 Gastrointestinal hemorrhage, unspecified: Secondary | ICD-10-CM | POA: Diagnosis not present

## 2017-02-01 DIAGNOSIS — R3911 Hesitancy of micturition: Secondary | ICD-10-CM | POA: Diagnosis not present

## 2017-02-01 DIAGNOSIS — R918 Other nonspecific abnormal finding of lung field: Secondary | ICD-10-CM | POA: Diagnosis not present

## 2017-02-01 DIAGNOSIS — Y92230 Patient room in hospital as the place of occurrence of the external cause: Secondary | ICD-10-CM | POA: Diagnosis not present

## 2017-02-01 DIAGNOSIS — Y95 Nosocomial condition: Secondary | ICD-10-CM | POA: Diagnosis not present

## 2017-02-01 DIAGNOSIS — Q244 Congenital subaortic stenosis: Secondary | ICD-10-CM

## 2017-02-01 DIAGNOSIS — E876 Hypokalemia: Secondary | ICD-10-CM | POA: Diagnosis not present

## 2017-02-01 DIAGNOSIS — Z888 Allergy status to other drugs, medicaments and biological substances status: Secondary | ICD-10-CM

## 2017-02-01 DIAGNOSIS — D649 Anemia, unspecified: Secondary | ICD-10-CM | POA: Diagnosis not present

## 2017-02-01 DIAGNOSIS — I959 Hypotension, unspecified: Secondary | ICD-10-CM | POA: Diagnosis present

## 2017-02-01 DIAGNOSIS — I482 Chronic atrial fibrillation: Secondary | ICD-10-CM | POA: Diagnosis present

## 2017-02-01 DIAGNOSIS — F419 Anxiety disorder, unspecified: Secondary | ICD-10-CM | POA: Diagnosis not present

## 2017-02-01 DIAGNOSIS — R509 Fever, unspecified: Secondary | ICD-10-CM | POA: Diagnosis not present

## 2017-02-01 DIAGNOSIS — I5032 Chronic diastolic (congestive) heart failure: Secondary | ICD-10-CM | POA: Diagnosis not present

## 2017-02-01 DIAGNOSIS — Y840 Cardiac catheterization as the cause of abnormal reaction of the patient, or of later complication, without mention of misadventure at the time of the procedure: Secondary | ICD-10-CM | POA: Diagnosis not present

## 2017-02-01 DIAGNOSIS — I472 Ventricular tachycardia: Secondary | ICD-10-CM | POA: Diagnosis not present

## 2017-02-01 DIAGNOSIS — Z7189 Other specified counseling: Secondary | ICD-10-CM

## 2017-02-01 DIAGNOSIS — M6281 Muscle weakness (generalized): Secondary | ICD-10-CM | POA: Diagnosis not present

## 2017-02-01 DIAGNOSIS — Z91048 Other nonmedicinal substance allergy status: Secondary | ICD-10-CM

## 2017-02-01 DIAGNOSIS — K2930 Chronic superficial gastritis without bleeding: Secondary | ICD-10-CM | POA: Diagnosis not present

## 2017-02-01 DIAGNOSIS — I10 Essential (primary) hypertension: Secondary | ICD-10-CM

## 2017-02-01 DIAGNOSIS — I471 Supraventricular tachycardia: Secondary | ICD-10-CM | POA: Diagnosis present

## 2017-02-01 DIAGNOSIS — N183 Chronic kidney disease, stage 3 (moderate): Secondary | ICD-10-CM | POA: Diagnosis present

## 2017-02-01 DIAGNOSIS — K2960 Other gastritis without bleeding: Secondary | ICD-10-CM | POA: Diagnosis present

## 2017-02-01 DIAGNOSIS — J80 Acute respiratory distress syndrome: Secondary | ICD-10-CM | POA: Diagnosis not present

## 2017-02-01 DIAGNOSIS — I481 Persistent atrial fibrillation: Secondary | ICD-10-CM | POA: Diagnosis not present

## 2017-02-01 DIAGNOSIS — R531 Weakness: Secondary | ICD-10-CM | POA: Diagnosis not present

## 2017-02-01 DIAGNOSIS — Z950 Presence of cardiac pacemaker: Secondary | ICD-10-CM

## 2017-02-01 DIAGNOSIS — N179 Acute kidney failure, unspecified: Secondary | ICD-10-CM | POA: Diagnosis not present

## 2017-02-01 DIAGNOSIS — R0600 Dyspnea, unspecified: Secondary | ICD-10-CM

## 2017-02-01 DIAGNOSIS — J90 Pleural effusion, not elsewhere classified: Secondary | ICD-10-CM | POA: Diagnosis not present

## 2017-02-01 DIAGNOSIS — D62 Acute posthemorrhagic anemia: Secondary | ICD-10-CM | POA: Diagnosis not present

## 2017-02-01 DIAGNOSIS — I25110 Atherosclerotic heart disease of native coronary artery with unstable angina pectoris: Secondary | ICD-10-CM | POA: Diagnosis not present

## 2017-02-01 DIAGNOSIS — T8111XD Postprocedural  cardiogenic shock, subsequent encounter: Secondary | ICD-10-CM | POA: Diagnosis not present

## 2017-02-01 DIAGNOSIS — L8990 Pressure ulcer of unspecified site, unspecified stage: Secondary | ICD-10-CM

## 2017-02-01 DIAGNOSIS — R42 Dizziness and giddiness: Secondary | ICD-10-CM | POA: Diagnosis not present

## 2017-02-01 DIAGNOSIS — Z7901 Long term (current) use of anticoagulants: Secondary | ICD-10-CM

## 2017-02-01 DIAGNOSIS — Z79899 Other long term (current) drug therapy: Secondary | ICD-10-CM

## 2017-02-01 DIAGNOSIS — Z86010 Personal history of colonic polyps: Secondary | ICD-10-CM

## 2017-02-01 DIAGNOSIS — R1312 Dysphagia, oropharyngeal phase: Secondary | ICD-10-CM | POA: Diagnosis not present

## 2017-02-01 DIAGNOSIS — M19042 Primary osteoarthritis, left hand: Secondary | ICD-10-CM | POA: Diagnosis present

## 2017-02-01 DIAGNOSIS — Z9049 Acquired absence of other specified parts of digestive tract: Secondary | ICD-10-CM

## 2017-02-01 DIAGNOSIS — B961 Klebsiella pneumoniae [K. pneumoniae] as the cause of diseases classified elsewhere: Secondary | ICD-10-CM | POA: Diagnosis not present

## 2017-02-01 DIAGNOSIS — Z980 Intestinal bypass and anastomosis status: Secondary | ICD-10-CM

## 2017-02-01 DIAGNOSIS — R404 Transient alteration of awareness: Secondary | ICD-10-CM | POA: Diagnosis not present

## 2017-02-01 DIAGNOSIS — I4721 Torsades de pointes: Secondary | ICD-10-CM

## 2017-02-01 DIAGNOSIS — R2681 Unsteadiness on feet: Secondary | ICD-10-CM | POA: Diagnosis not present

## 2017-02-01 DIAGNOSIS — I4891 Unspecified atrial fibrillation: Secondary | ICD-10-CM | POA: Diagnosis present

## 2017-02-01 DIAGNOSIS — M19041 Primary osteoarthritis, right hand: Secondary | ICD-10-CM | POA: Diagnosis present

## 2017-02-01 LAB — I-STAT TROPONIN, ED: TROPONIN I, POC: 0.05 ng/mL (ref 0.00–0.08)

## 2017-02-01 LAB — CBC WITH DIFFERENTIAL/PLATELET
Basophils Absolute: 0 10*3/uL (ref 0.0–0.1)
Basophils Relative: 0 %
Eosinophils Absolute: 0 10*3/uL (ref 0.0–0.7)
Eosinophils Relative: 1 %
HCT: 27.3 % — ABNORMAL LOW (ref 39.0–52.0)
HEMOGLOBIN: 9 g/dL — AB (ref 13.0–17.0)
LYMPHS ABS: 0.8 10*3/uL (ref 0.7–4.0)
LYMPHS PCT: 19 %
MCH: 30.8 pg (ref 26.0–34.0)
MCHC: 33 g/dL (ref 30.0–36.0)
MCV: 93.5 fL (ref 78.0–100.0)
Monocytes Absolute: 0.3 10*3/uL (ref 0.1–1.0)
Monocytes Relative: 7 %
NEUTROS PCT: 74 %
Neutro Abs: 3.1 10*3/uL (ref 1.7–7.7)
Platelets: 87 10*3/uL — ABNORMAL LOW (ref 150–400)
RBC: 2.92 MIL/uL — AB (ref 4.22–5.81)
RDW: 15.2 % (ref 11.5–15.5)
WBC: 4.1 10*3/uL (ref 4.0–10.5)

## 2017-02-01 LAB — URINALYSIS, ROUTINE W REFLEX MICROSCOPIC
BILIRUBIN URINE: NEGATIVE
Glucose, UA: NEGATIVE mg/dL
Hgb urine dipstick: NEGATIVE
KETONES UR: 20 mg/dL — AB
LEUKOCYTES UA: NEGATIVE
NITRITE: NEGATIVE
PH: 5 (ref 5.0–8.0)
PROTEIN: NEGATIVE mg/dL
Specific Gravity, Urine: 1.025 (ref 1.005–1.030)

## 2017-02-01 LAB — COMPREHENSIVE METABOLIC PANEL
ALK PHOS: 48 U/L (ref 38–126)
ALT: 16 U/L — AB (ref 17–63)
ANION GAP: 7 (ref 5–15)
AST: 34 U/L (ref 15–41)
Albumin: 3.5 g/dL (ref 3.5–5.0)
BILIRUBIN TOTAL: 1 mg/dL (ref 0.3–1.2)
BUN: 40 mg/dL — ABNORMAL HIGH (ref 6–20)
CALCIUM: 8.4 mg/dL — AB (ref 8.9–10.3)
CO2: 22 mmol/L (ref 22–32)
CREATININE: 1.31 mg/dL — AB (ref 0.61–1.24)
Chloride: 109 mmol/L (ref 101–111)
GFR, EST NON AFRICAN AMERICAN: 52 mL/min — AB (ref >60)
Glucose, Bld: 120 mg/dL — ABNORMAL HIGH (ref 65–99)
Potassium: 4.2 mmol/L (ref 3.5–5.1)
Sodium: 138 mmol/L (ref 135–145)
TOTAL PROTEIN: 5.7 g/dL — AB (ref 6.5–8.1)

## 2017-02-01 LAB — POC OCCULT BLOOD, ED: Fecal Occult Bld: POSITIVE — AB

## 2017-02-01 LAB — PREPARE RBC (CROSSMATCH)

## 2017-02-01 MED ORDER — PANTOPRAZOLE SODIUM 40 MG IV SOLR: 40.0000 mg | Freq: Two times a day (BID) | INTRAVENOUS | Status: DC

## 2017-02-01 MED ORDER — SODIUM CHLORIDE 0.9 % IV SOLN
INTRAVENOUS | Status: DC
  Administered 2017-02-01 – 2017-02-02 (×2): via INTRAVENOUS

## 2017-02-01 MED ORDER — SODIUM CHLORIDE 0.9 % IV SOLN: Freq: Once | INTRAVENOUS | Status: DC

## 2017-02-01 MED ORDER — SODIUM CHLORIDE 0.9 % IV SOLN
8.0000 mg/h | INTRAVENOUS | Status: AC
  Administered 2017-02-01 – 2017-02-04 (×7): 8 mg/h via INTRAVENOUS
  Filled 2017-02-01 (×10): qty 80

## 2017-02-01 MED ORDER — ACETAMINOPHEN 325 MG PO TABS: 650.0000 mg | ORAL_TABLET | Freq: Four times a day (QID) | ORAL | Status: DC | PRN

## 2017-02-01 MED ORDER — ACETAMINOPHEN 650 MG RE SUPP: 650.0000 mg | Freq: Four times a day (QID) | RECTAL | Status: DC | PRN

## 2017-02-01 MED ORDER — SODIUM CHLORIDE 0.9 % IV SOLN
80.0000 mg | Freq: Once | INTRAVENOUS | Status: AC
  Administered 2017-02-01: 80 mg via INTRAVENOUS
  Filled 2017-02-01 (×2): qty 80

## 2017-02-01 NOTE — ED Provider Notes (Signed)
[facility] EMERGENCY DEPARTMENT Provider Note   CSN: [id number] Arrival date & time: 02/01/17  1359     History   Chief Complaint Chief Complaint  Patient presents with  . GI Bleeding    HPI [name] is a 76 y.o. male.  The history is provided by the patient. No language interpreter was used.  Rectal Bleeding  Quality:  Black and tarry Amount:  Moderate Duration:  2 days Timing:  Constant Similar prior episodes: no   Relieved by:  Nothing Worsened by:  Nothing Ineffective treatments:  None tried Associated symptoms: dizziness and light-headedness   Associated symptoms: no abdominal pain   Risk factors: anticoagulant use   Pt has a colonoscopy on 10/26.  Pt reports 4 episodes of tar stools since.  Pt complains of feeling weak.   Past Medical History:  Diagnosis Date  . Anemia   . Arthritis    "little in my fingers" (10/21/2016)  . Chronic lower back pain    "last 3 months; usually when I bend" (12/24/2011)  . Coronary artery disease   . Endocarditis   . GERD (gastroesophageal reflux disease)   . Heart murmur   . High cholesterol   . Hypertension   . Migraines    "none in years" (10/21/2016)  . Myocardial infarction [facility])    POSSIBLE 2012 ELEVATED ENZYMES  . Pneumonia ~ 2011  . Presence of permanent cardiac pacemaker     Patient Active Problem List   Diagnosis Date Noted  . Atrial fibrillation ([place]) 10/22/2016  . Typical atrial flutter ([place]) 08/12/2016  . Atrial flutter ([place]) 08/12/2016  . Hypotension 05/06/2016  . Atrial fibrillation with RVR ([place]) 05/06/2016  . AKI (acute kidney injury) ([place]) 05/06/2016  . SVT (supraventricular tachycardia) ([place]) 01/31/2015  . Colonic mass, right colon. 11/10/2011  . Acute bacterial endocarditis 11/06/2011  . HTN (hypertension) 11/06/2011  . BPH (benign prostatic hyperplasia) 11/06/2011  . 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 Back pain 11/06/2011  . Hyperlipidemia 11/06/2011  . Acute renal  insufficiency 08/25/2010  . Viridans streptococci infection 08/25/2010  . Streptococcus infection 08/25/2010  . Tooth infection 08/25/2010  . Status post PICC central line placement 08/25/2010  . Arm pain, right 08/25/2010  . ENDOCARDITIS, BACTERIAL, SUBACUTE 05/07/2010  . Iron deficiency anemia 05/06/2010  . Hypertrophic obstructive cardiomyopathy ([place]) 05/06/2010  . SYNCOPE 05/06/2010  . DYSPNEA ON EXERTION 05/06/2010  . ECHOCARDIOGRAM, ABNORMAL 05/06/2010  . HEART MURMUR, HX OF 05/06/2010    Past Surgical History:  Procedure Laterality Date  . A-FLUTTER ABLATION N/A 08/12/2016   Procedure: A-Flutter Ablation;  Surgeon: Evans Lance, MD;  Location: [facility] CV LAB;  Service: Cardiovascular;  Laterality: N/A;  . APPENDECTOMY    . AV NODE ABLATION  10/21/2016  . AV NODE ABLATION N/A 10/21/2016   Procedure: AV Node Ablation;  Surgeon: Evans Lance, MD;  Location: [place] [facility];  Service: Cardiovascular;  Laterality: N/A;  . BI-VENTRICULAR PACEMAKER INSERTION (CRT-P)  10/21/2016   BiV Pacemaker Insertion CRT-P  . BIV PACEMAKER INSERTION CRT-P N/A 10/21/2016   Procedure: BiV Pacemaker Insertion CRT-P;  Surgeon: Evans Lance, MD;  Location: [facility];  Service: Cardiovascular;  Laterality: N/A;  . CARDIOVERSION N/A 10/29/2015   Procedure: CARDIOVERSION;  Surgeon: Adrian Prows, MD;  Location[facility] ENDOSCOPY;  Service: Cardiovascular;  Laterality: N/A;  . COLON RESECTION  12/25/2011   Procedure: COLON RESECTION LAPAROSCOPIC;  Surgeon: Shann Medal, MD;  Location: [facility];  Service: General;  Laterality: Right;  laparoscopic assisted right colon resection  . COLON SURGERY    . COLONOSCOPY  11/11/2011   Procedure: COLONOSCOPY;  Surgeon: Missy Sabins, MD;  Location: [facility];  Service: Endoscopy;  Laterality: N/A;  . CORONARY ANGIOPLASTY WITH STENT PLACEMENT  11/11/2010   "1"  . CYSTECTOMY Right ~ 1980    eyelid  . INGUINAL HERNIA REPAIR Bilateral   . TEE WITHOUT  CARDIOVERSION  11/06/2011   Procedure: TRANSESOPHAGEAL ECHOCARDIOGRAM (TEE);  Surgeon: Laverda Page, MD;  Location: [facility];  Service: Cardiovascular;  Laterality: N/A;  . TEE WITHOUT CARDIOVERSION N/A 10/20/2016   Procedure: TRANSESOPHAGEAL ECHOCARDIOGRAM (TEE);  Surgeon: Dorothy Spark, MD;  Location: [facility] ENDOSCOPY;  Service: Cardiovascular;  Laterality: N/A;       Home Medications    Prior to Admission medications   Medication Sig Start Date End Date Taking? Authorizing Provider  albuterol (PROVENTIL HFA;VENTOLIN HFA) 108 (90 Base) MCG/ACT inhaler Inhale 1-2 puffs into the lungs every 6 (six) hours as needed for wheezing or shortness of breath. 07/07/16  Yes Mabe, Shanon Brow, NP  apixaban (ELIQUIS) 5 MG TABS tablet Take 5 mg by mouth 2 (two) times daily.   Yes [provider]  Ascorbic Acid (VITAMIN C) 500 MG tablet Take 500 mg by mouth 2 (two) times daily.     Yes [provider]  b complex vitamins tablet Take 1 tablet by mouth daily.   Yes [provider]  Bilberry 1000 MG CAPS Take 1,000 mg by mouth daily.    Yes [provider]  Cholecalciferol (VITAMIN D PO) Take 1 capsule by mouth daily.   Yes [provider]  Coenzyme Q10 (EQL COQ10) 300 MG CAPS Take 1 capsule by mouth every morning.    Yes [provider]  diphenhydrAMINE (BENADRYL) 25 MG tablet Take 25 mg by mouth daily as needed for itching.   Yes [provider]  Ferrous Sulfate (IRON) 325 (65 Fe) MG TABS Take 325 mg by mouth 2 (two) times daily.    Yes [provider]  furosemide (LASIX) 20 MG tablet Take 1 tablet (20 mg total) by mouth daily. 10/29/16 02/01/17 Yes Baldwin Jamaica, PA-C  Glucosamine HCl 1000 MG TABS Take 1,000 mg by mouth 2 (two) times daily.    Yes [provider]  HAWTHORN BERRY PO Take 1 capsule by mouth every morning.    Yes [provider]  HORSE CHESTNUT PO Take 1 capsule by mouth daily.   Yes [provider]  hydrocortisone cream 1 % Apply 1 application topically daily as needed for itching.   Yes [provider]  ipratropium (ATROVENT) 0.06 % nasal spray 2 sprays into each nostril 4 times a day as needed for runny nose. Patient taking differently: Place 2 sprays into both nostrils 4 (four) times daily as needed for rhinitis.  07/07/16  Yes Mabe, Shanon Brow, NP  metoprolol succinate (TOPROL-XL) 25 MG 24 hr tablet Take 25 mg by mouth daily.   Yes [provider]  Multiple Vitamin (MULTIVITAMIN WITH MINERALS) TABS Take 1 tablet by mouth every morning.    Yes [provider]  omeprazole (PRILOSEC) 20 MG capsule Take 20 mg by mouth daily. 01/29/17  Yes [provider]  Polyethyl Glycol-Propyl Glycol (SYSTANE) 0.4-0.3 % SOLN Place 1 drop into both eyes 2 (two) times daily.    Yes [provider]  pravastatin (PRAVACHOL) 40 MG tablet Take 40 mg by mouth at bedtime.  Yes [provider]  saw palmetto 160 MG capsule Take 160 mg by mouth 2 (two) times daily.    Yes [provider]  vitamin E 400 UNIT capsule Take 400 Units by mouth daily.    Yes [provider]  VITAMIN K PO Take 1 capsule by mouth daily.   Yes [provider]  imiquimod (ALDARA) 5 % cream Apply 1 application topically at bedtime. 10/13/16   [provider]  triamcinolone cream (KENALOG) 0.1 % Apply 1 application topically 2 (two) times daily. Patient not taking: Reported on 02/01/2017 09/16/16   Vanessa Kick, MD    Family History Family History  Problem Relation Age of Onset  . Hypertension Mother   . Heart failure Father   . Cancer Neg Hx   . Diabetes Neg Hx   . Hyperlipidemia Neg Hx   . Sudden death Neg Hx   . Stroke Neg Hx   . Heart attack Neg Hx     Social History Social History  Substance Use Topics  . Smoking status: Never Smoker  . Smokeless tobacco: Never Used     Comment: 12/24/2011 "used to puff cigarettes; never  inhaled"  . Alcohol use Yes     Comment: 10/21/2016 "don't drink at all anymore; used to have a beer q 3-4 months"     Allergies   Adhesive [tape]; Brilinta [ticagrelor]; and Penicillins   Review of Systems Review of Systems  Gastrointestinal: Positive for hematochezia. Negative for abdominal pain.  Neurological: Positive for dizziness and light-headedness.  All other systems reviewed and are negative.    Physical Exam Updated Vital Signs BP (!) 90/47   Pulse 69   Temp 98 F (36.7 C) (Oral)   Resp 10   SpO2 100%   Physical Exam  Constitutional: He appears well-developed and well-nourished.  HENT:  Head: Normocephalic and atraumatic.  Right Ear: External ear normal.  Left Ear: External ear normal.  Eyes: Conjunctivae are normal.  Pale   Neck: Neck supple.  Cardiovascular: Normal rate and regular rhythm.   No murmur heard. Pulmonary/Chest: Effort normal and breath sounds normal. No respiratory distress.  Abdominal: Soft. There is no tenderness.  Musculoskeletal: He exhibits no edema.  Neurological: He is alert.  Skin: Skin is warm and dry. There is pallor.  Psychiatric: He has a normal mood and affect.  Nursing note and vitals reviewed.    ED Treatments / Results  Labs (all labs ordered are listed, but only abnormal results are displayed) Labs Reviewed  CBC WITH DIFFERENTIAL/PLATELET - Abnormal; Notable for the following:       Result Value   RBC 2.92 (*)    Hemoglobin 9.0 (*)    HCT 27.3 (*)    All other components within normal limits  POC OCCULT BLOOD, ED - Abnormal; Notable for the following:    Fecal Occult Bld POSITIVE (*)    All other components within normal limits  COMPREHENSIVE METABOLIC PANEL  URINALYSIS, ROUTINE W REFLEX MICROSCOPIC  I-STAT TROPONIN, ED  TYPE AND SCREEN    EKG  EKG Interpretation None       Radiology No results found.  Procedures Procedures (including critical care time)  Medications Ordered in ED Medications -  No data to display   Initial Impression / Assessment and Plan / ED Course  I have reviewed the triage vital signs and the nursing notes.  Pertinent labs & imaging results that were available during my care of the patient were reviewed by  me and considered in my medical decision making (see chart for details).     Consult hospitalist  For symptomatic anemia and gi bleeding.  I spoke to Dr. Watt Climes who advised to keep pt npo after midnight.  Call on call Md if any problems.   Final Clinical Impressions(s) / ED Diagnoses   Final diagnoses:  Acute GI bleeding    New Prescriptions New Prescriptions   No medications on file     Sidney Ace 02/01/17 1633    Fransico Meadow, PA-C 02/01/17 1635    Fransico Meadow, [place] 02/01/17 1708    Carmin Muskrat, MD 02/02/17 [phone]

## 2017-02-01 NOTE — ED Triage Notes (Addendum)
To ED via [facility] for eval after having 4 episodes of black stools yesterday and 1 today. Pt had colonoscopy yesterday. Appears pale. 500cc NS given PTA. CBG 132  VS PTA- 96/50 HR 72 RR 14 Sats 99%

## 2017-02-01 NOTE — ED Notes (Signed)
Pt states that he has had total of 5 dark tarry stools since yesterday, had colonoscopy with [facility] of Friday. Pt is pale, mucous membranes pale,

## 2017-02-01 NOTE — H&P (Signed)
History and Physical    [name] WUX:[id number] DOB: Sep 29, 1940 DOA: 02/01/2017  Referring MD/NP/PA: ER PCP: [name], MD Outpatient Specialists: [facility] Patient coming from: home  Chief Complaint: dyspnea with exertion/dary tarry stools  HPI: [name] is a 76 y.o. male with medical history significant of  HTN, GERD, atrial fib s/p ablation and pacemaker.  He has both EGD and colonoscopy on 10/26-- thinks that perhaps they did a biopsy.  No records found in care everywhere or Epic.  Was told he could resume his eliquis that night which he did.  He had 1 episode of dark BM on Saturday but Sunday had 4 and felt very SOB today when walking to the bathroom.  He had dizziness as well and weakness.  Grandson who was staying with patient thought he looked pale.    In the ER, he was found to have heme positive stools.  Hgb was similar to a few months prior- around 9.  ER PA spoke with Dr. Watt Climes who would like patient to be NPO after midnight.  He was typed and crossed.  BP was low but patient tends to run around 253 systolically.    Review of Systems: all systems reviewed, negative unless stated above in HPI   Past Medical History:  Diagnosis Date  . Anemia   . Arthritis    "little in my fingers" (10/21/2016)  . Chronic lower back pain    "last 3 months; usually when I bend" (12/24/2011)  . Coronary artery disease   . Endocarditis   . GERD (gastroesophageal reflux disease)   . Heart murmur   . High cholesterol   . Hypertension   . Migraines    "none in years" (10/21/2016)  . Myocardial infarction [facility])    POSSIBLE 2012 ELEVATED ENZYMES  . Pneumonia ~ 2011  . Presence of permanent cardiac pacemaker     Past Surgical History:  Procedure Laterality Date  . A-FLUTTER ABLATION N/A 08/12/2016   Procedure: A-Flutter Ablation;  Surgeon: Evans Lance, MD;  Location: [place] CV LAB;  Service: Cardiovascular;  Laterality: N/A;  . APPENDECTOMY    . AV NODE ABLATION   10/21/2016  . AV NODE ABLATION N/A 10/21/2016   Procedure: AV Node Ablation;  Surgeon: Evans Lance, MD;  Location: [place] [facility];  Service: Cardiovascular;  Laterality: N/A;  . BI-VENTRICULAR PACEMAKER INSERTION (CRT-P)  10/21/2016   BiV Pacemaker Insertion CRT-P  . BIV PACEMAKER INSERTION CRT-P N/A 10/21/2016   Procedure: BiV Pacemaker Insertion CRT-P;  Surgeon: Evans Lance, MD;  Location: [place] [facility];  Service: Cardiovascular;  Laterality: N/A;  . CARDIOVERSION N/A 10/29/2015   Procedure: CARDIOVERSION;  Surgeon: Adrian Prows, MD;  Location: [facility] ENDOSCOPY;  Service: Cardiovascular;  Laterality: N/A;  . COLON RESECTION  12/25/2011   Procedure: COLON RESECTION LAPAROSCOPIC;  Surgeon: Shann Medal, MD;  Location: [place];  Service: General;  Laterality: Right;  laparoscopic assisted right colon resection  . COLON SURGERY    . COLONOSCOPY  11/11/2011   Procedure: COLONOSCOPY;  Surgeon: Missy Sabins, MD;  Location: [place];  Service: Endoscopy;  Laterality: N/A;  . CORONARY ANGIOPLASTY WITH STENT PLACEMENT  11/11/2010   "1"  . CYSTECTOMY Right ~ 1980    eyelid  . INGUINAL HERNIA REPAIR Bilateral   . TEE WITHOUT CARDIOVERSION  11/06/2011   Procedure: TRANSESOPHAGEAL ECHOCARDIOGRAM (TEE);  Surgeon: Laverda Page, MD;  Location: [place];  Service: Cardiovascular;  Laterality: N/A;  .  TEE WITHOUT CARDIOVERSION N/A 10/20/2016   Procedure: TRANSESOPHAGEAL ECHOCARDIOGRAM (TEE);  Surgeon: Dorothy Spark, MD;  Location: [facility] ENDOSCOPY;  Service: Cardiovascular;  Laterality: N/A;     reports that he has never smoked. He has never used smokeless tobacco. He reports that he drinks alcohol. He reports that he does not use drugs.  Allergies  Allergen Reactions  . Adhesive [Tape] Itching and Rash    (The patches affected his back, when he had his ablation)  . Brilinta [Ticagrelor] Itching and Rash  . Penicillins Rash    Has patient had a PCN reaction causing immediate rash,  facial/tongue/throat swelling, SOB or lightheadedness with hypotension: Yes Has patient had a PCN reaction causing severe rash involving mucus membranes or skin necrosis: Yes Has patient had a PCN reaction that required hospitalization No Has patient had a PCN reaction occurring within the last 10 years: No If all of the above answers are "NO", then may proceed with Cephalosporin use.     Family History  Problem Relation Age of Onset  . Hypertension Mother   . Heart failure Father   . Cancer Neg Hx   . Diabetes Neg Hx   . Hyperlipidemia Neg Hx   . Sudden death Neg Hx   . Stroke Neg Hx   . Heart attack Neg Hx      Prior to Admission medications   Medication Sig Start Date End Date Taking? Authorizing Provider  albuterol (PROVENTIL HFA;VENTOLIN HFA) 108 (90 Base) MCG/ACT inhaler Inhale 1-2 puffs into the lungs every 6 (six) hours as needed for wheezing or shortness of breath. 07/07/16  Yes Mabe, Shanon Brow, NP  apixaban (ELIQUIS) 5 MG TABS tablet Take 5 mg by mouth 2 (two) times daily.   Yes [provider]  Ascorbic Acid (VITAMIN C) 500 MG tablet Take 500 mg by mouth 2 (two) times daily.     Yes [provider]  b complex vitamins tablet Take 1 tablet by mouth daily.   Yes [provider]  Bilberry 1000 MG CAPS Take 1,000 mg by mouth daily.    Yes [provider]  Cholecalciferol (VITAMIN D PO) Take 1 capsule by mouth daily.   Yes [provider]  Coenzyme Q10 (EQL COQ10) 300 MG CAPS Take 1 capsule by mouth every morning.    Yes [provider]  diphenhydrAMINE (BENADRYL) 25 MG tablet Take 25 mg by mouth daily as needed for itching.   Yes [provider]  Ferrous Sulfate (IRON) 325 (65 Fe) MG TABS Take 325 mg by mouth 2 (two) times daily.    Yes [provider]  furosemide (LASIX) 20 MG tablet Take 1 tablet (20 mg total) by mouth daily. 10/29/16 02/01/17 Yes Baldwin Jamaica, PA-C  Glucosamine HCl 1000 MG TABS Take 1,000  mg by mouth 2 (two) times daily.    Yes [provider]  HAWTHORN BERRY PO Take 1 capsule by mouth every morning.    Yes [provider]  HORSE CHESTNUT PO Take 1 capsule by mouth daily.   Yes [provider]  hydrocortisone cream 1 % Apply 1 application topically daily as needed for itching.   Yes [provider]  ipratropium (ATROVENT) 0.06 % nasal spray 2 sprays into each nostril 4 times a day as needed for runny nose. Patient taking differently: Place 2 sprays into both nostrils 4 (four) times daily as needed for rhinitis.  07/07/16  Yes Mabe, Shanon Brow, NP  metoprolol succinate (TOPROL-XL) 25 MG 24  hr tablet Take 25 mg by mouth daily.   Yes [provider]  Multiple Vitamin (MULTIVITAMIN WITH MINERALS) TABS Take 1 tablet by mouth every morning.    Yes [provider]  omeprazole (PRILOSEC) 20 MG capsule Take 20 mg by mouth daily. 01/29/17  Yes [provider]  Polyethyl Glycol-Propyl Glycol (SYSTANE) 0.4-0.3 % SOLN Place 1 drop into both eyes 2 (two) times daily.    Yes [provider]  pravastatin (PRAVACHOL) 40 MG tablet Take 40 mg by mouth at bedtime.    Yes [provider]  saw palmetto 160 MG capsule Take 160 mg by mouth 2 (two) times daily.    Yes [provider]  vitamin E 400 UNIT capsule Take 400 Units by mouth daily.    Yes [provider]  VITAMIN K PO Take 1 capsule by mouth daily.   Yes [provider]  imiquimod (ALDARA) 5 % cream Apply 1 application topically at bedtime. 10/13/16   [provider]  triamcinolone cream (KENALOG) 0.1 % Apply 1 application topically 2 (two) times daily. Patient not taking: Reported on 02/01/2017 09/16/16   Vanessa Kick, MD    Physical Exam: Vitals:   02/01/17 1630 02/01/17 1645 02/01/17 1700 02/01/17 1715  BP: (!) 73/59 (!) 87/55 (!) 91/47 99/78  Pulse: 69 70 70 70  Resp: 15 15 16 18   Temp:      TempSrc:      SpO2: 99% 98% 98% 100%       Constitutional: NAD, calm, comfortable Vitals:   02/01/17 1630 02/01/17 1645 02/01/17 1700 02/01/17 1715  BP: (!) 73/59 (!) 87/55 (!) 91/47 99/78  Pulse: 69 70 70 70  Resp: 15 15 16 18   Temp:      TempSrc:      SpO2: 99% 98% 98% 100%   Eyes: pale  ENMT: Mucous membranes are moist. Posterior pharynx clear of any exudate or lesions.Normal dentition.  Neck: normal, supple, no masses, no thyromegaly Respiratory: clear to auscultation bilaterally, no wheezing, no crackles. Normal respiratory effort. No accessory muscle use.  Cardiovascular: Regular rate and rhythm, no murmurs / rubs / gallops. No extremity edema. 2+ pedal pulses. No carotid bruits.  Abdomen: no tenderness, no masses palpated. No hepatosplenomegaly. Bowel sounds positive.  Musculoskeletal: no clubbing / cyanosis. No joint deformity upper and lower extremities. Good ROM, no contractures. Normal muscle tone.  Skin: no rashes, lesions, ulcers. No induration Neurologic: CN 2-12 grossly intact. Sensation intact, DTR normal. Strength 5/5 in all 4.  Psychiatric: Normal judgment and insight. Alert and oriented x 3. Normal mood.    Labs on Admission: I have personally reviewed following labs and imaging studies  CBC:  Recent Labs Lab 02/01/17 1500  WBC 4.1  NEUTROABS 3.1  HGB 9.0*  HCT 27.3*  MCV 93.5  PLT 87*   Basic Metabolic Panel:  Recent Labs Lab 02/01/17 1500  NA 138  K 4.2  CL 109  CO2 22  GLUCOSE 120*  BUN 40*  CREATININE 1.31*  CALCIUM 8.4*   GFR: CrCl cannot be calculated (Unknown ideal weight.). Liver Function Tests:  Recent Labs Lab 02/01/17 1500  AST 34  ALT 16*  ALKPHOS 48  BILITOT 1.0  PROT 5.7*  ALBUMIN 3.5   No results for input(s): LIPASE, AMYLASE in the last 168 hours. No results for input(s): AMMONIA in the last 168 hours. Coagulation Profile: No results for input(s): INR, PROTIME in the last 168 hours. Cardiac Enzymes: No results for input(s):  CKTOTAL, CKMB,  CKMBINDEX, TROPONINI in the last 168 hours. BNP (last 3 results) No results for input(s): PROBNP in the last 8760 hours. HbA1C: No results for input(s): HGBA1C in the last 72 hours. CBG: No results for input(s): GLUCAP in the last 168 hours. Lipid Profile: No results for input(s): CHOL, HDL, LDLCALC, TRIG, CHOLHDL, LDLDIRECT in the last 72 hours. Thyroid Function Tests: No results for input(s): TSH, T4TOTAL, FREET4, T3FREE, THYROIDAB in the last 72 hours. Anemia Panel: No results for input(s): VITAMINB12, FOLATE, FERRITIN, TIBC, IRON, RETICCTPCT in the last 72 hours. Urine analysis:    Component Value Date/Time   COLORURINE YELLOW 02/01/2017 [place] 02/01/2017 1653   LABSPEC 1.025 02/01/2017 1653   PHURINE 5.0 02/01/2017 1653   GLUCOSEU NEGATIVE 02/01/2017 1653   HGBUR NEGATIVE 02/01/2017 1653   BILIRUBINUR NEGATIVE 02/01/2017 1653   KETONESUR 20 (A) 02/01/2017 1653   PROTEINUR NEGATIVE 02/01/2017 1653   UROBILINOGEN 0.2 01/13/2015 2015   NITRITE NEGATIVE 02/01/2017 1653   LEUKOCYTESUR NEGATIVE 02/01/2017 1653   Sepsis Labs: Invalid input(s): PROCALCITONIN, LACTICIDVEN No results found for this or any previous visit (from the past 240 hour(s)).   Radiological Exams on Admission: No results found.    Assessment/Plan Active Problems:   HTN (hypertension)   Atrial fibrillation (HCC)   GI bleed   GI bleed -recent EGD/colonoscopy-- ? Biopsy taken but not sure where. -hold eliquis -h/h this PM, will transfuse 1 unit PRBC for now -recheck CBC in AM  HTN -BP runs low -hold all medications  H/o a fib s/p ablation and pacemaker -follows with Dr. [name] -hold eliquis for now    DVT prophylaxis: scd Code Status: full Family Communication: at bedside Disposition Plan:  Consults called:GI ([place]) Admission status: inpt SDU   [place] DO [facility] Pager [phone]  If 7PM-7AM, please contact  night-coverage [email or web address] Password [email or web address]  02/01/2017, 5:45 PM

## 2017-02-02 ENCOUNTER — Encounter (HOSPITAL_COMMUNITY): Payer: Self-pay | Admitting: Student

## 2017-02-02 DIAGNOSIS — I959 Hypotension, unspecified: Secondary | ICD-10-CM

## 2017-02-02 DIAGNOSIS — D649 Anemia, unspecified: Secondary | ICD-10-CM

## 2017-02-02 LAB — CBC
HCT: 27 % — ABNORMAL LOW (ref 39.0–52.0)
HEMATOCRIT: 28.8 % — AB (ref 39.0–52.0)
Hemoglobin: 9.1 g/dL — ABNORMAL LOW (ref 13.0–17.0)
Hemoglobin: 9.5 g/dL — ABNORMAL LOW (ref 13.0–17.0)
MCH: 31.2 pg (ref 26.0–34.0)
MCH: 30.9 pg (ref 26.0–34.0)
MCHC: 33.7 g/dL (ref 30.0–36.0)
MCHC: 33 g/dL (ref 30.0–36.0)
MCV: 92.5 fL (ref 78.0–100.0)
MCV: 93.8 fL (ref 78.0–100.0)
PLATELETS: 72 10*3/uL — AB (ref 150–400)
Platelets: 83 10*3/uL — ABNORMAL LOW (ref 150–400)
RBC: 2.92 MIL/uL — ABNORMAL LOW (ref 4.22–5.81)
RBC: 3.07 MIL/uL — ABNORMAL LOW (ref 4.22–5.81)
RDW: 15.9 % — AB (ref 11.5–15.5)
RDW: 15.3 % (ref 11.5–15.5)
WBC: 8.4 10*3/uL (ref 4.0–10.5)
WBC: 4.4 10*3/uL (ref 4.0–10.5)

## 2017-02-02 LAB — TYPE AND SCREEN
ABO/RH(D): O POS
Antibody Screen: NEGATIVE
UNIT DIVISION: 0

## 2017-02-02 LAB — BASIC METABOLIC PANEL
Anion gap: 7 (ref 5–15)
BUN: 36 mg/dL — ABNORMAL HIGH (ref 6–20)
CHLORIDE: 108 mmol/L (ref 101–111)
CO2: 23 mmol/L (ref 22–32)
CREATININE: 1.29 mg/dL — AB (ref 0.61–1.24)
Calcium: 8.3 mg/dL — ABNORMAL LOW (ref 8.9–10.3)
GFR, EST NON AFRICAN AMERICAN: 53 mL/min — AB (ref >60)
Glucose, Bld: 110 mg/dL — ABNORMAL HIGH (ref 65–99)
POTASSIUM: 3.9 mmol/L (ref 3.5–5.1)
SODIUM: 138 mmol/L (ref 135–145)

## 2017-02-02 LAB — BPAM RBC
BLOOD PRODUCT EXPIRATION DATE: 201811042359
ISSUE DATE / TIME: 201810291747
UNIT TYPE AND RH: 5100

## 2017-02-02 LAB — MRSA PCR SCREENING: MRSA BY PCR: NEGATIVE

## 2017-02-02 MED ORDER — SODIUM CHLORIDE 0.9 % IV BOLUS (SEPSIS)
500.0000 mL | Freq: Once | INTRAVENOUS | Status: AC
  Administered 2017-02-02: 500 mL via INTRAVENOUS

## 2017-02-02 MED ORDER — ACETAMINOPHEN 325 MG PO TABS
650.0000 mg | ORAL_TABLET | Freq: Four times a day (QID) | ORAL | Status: DC | PRN
  Administered 2017-02-04 – 2017-02-05 (×3): 650 mg via ORAL
  Filled 2017-02-02 (×3): qty 2

## 2017-02-02 MED ORDER — IPRATROPIUM BROMIDE 0.06 % NA SOLN
2.0000 | Freq: Four times a day (QID) | NASAL | Status: DC
  Administered 2017-02-03 – 2017-02-14 (×7): 2 via NASAL
  Filled 2017-02-02: qty 15

## 2017-02-02 MED ORDER — ALBUTEROL SULFATE (2.5 MG/3ML) 0.083% IN NEBU: 3.0000 mg | INHALATION_SOLUTION | Freq: Four times a day (QID) | RESPIRATORY_TRACT | Status: DC | PRN

## 2017-02-02 NOTE — Consult Note (Signed)
Referring Provider: Dr. Thereasa Solo Primary Care Physician:  [name], MD Primary Gastroenterologist:  Dr. Therisa Doyne  Reason for Consultation:  Melena; Anemia  HPI: [name] is a 76 y.o. male with a history of iron deficiency anemia who is s/p EGD/colonoscopy on 01/29/17 by Dr. Therisa Doyne with the colonoscopy showing large internal hemorrhoids and a normal ileocolonic anastomosis and the EGD showed antral gastritis that was biopsied. Duodenal biopsies were also taken due to iron deficiency anemia. 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 His Eliquis was held prior to his procedures and he was advised to resume the Eliquis the evening following his procedures. Started having black stools this weekend and reports several black stools over the weekend with dizziness and dyspnea on exertion. Hypotensive in ER 70's - 90's/50's. He felt like he was going to pass out while standing at his bathroom sink yesterday and came to the hospital. Hgb 9.0 (11.9 on 12/30/16 at our office). Hgb 9.1 today after one unit of PRBCs. Denies any black stools since admit. Denies hematochezia, N/V, hematemesis. Intermittent left flank pain but denies any abdominal pain since the procedures. Reports being on Brilinta in the past but had a "reaction." Has been on Eliquis for over a year.  Past Medical History:  Diagnosis Date  . Anemia   . Arthritis    "little in my fingers" (10/21/2016)  . Chronic lower back pain    "last 3 months; usually when I bend" (12/24/2011)  . Coronary artery disease   . Endocarditis   . GERD (gastroesophageal reflux disease)   . Heart murmur   . High cholesterol   . Hypertension   . Migraines    "none in years" (10/21/2016)  . Myocardial infarction [facility])    POSSIBLE 2012 ELEVATED ENZYMES  . Pneumonia ~ 2011  . Presence of permanent cardiac pacemaker     Past Surgical History:  Procedure Laterality Date  . A-FLUTTER ABLATION N/A 08/12/2016   Procedure:  A-Flutter Ablation;  Surgeon: Evans Lance, MD;  Location: [place] [facility];  Service: Cardiovascular;  Laterality: N/A;  . APPENDECTOMY    . AV NODE ABLATION  10/21/2016  . AV NODE ABLATION N/A 10/21/2016   Procedure: AV Node Ablation;  Surgeon: Evans Lance, MD;  Location: [place] [facility];  Service: Cardiovascular;  Laterality: N/A;  . BI-VENTRICULAR PACEMAKER INSERTION (CRT-P)  10/21/2016   BiV Pacemaker Insertion CRT-P  . BIV PACEMAKER INSERTION CRT-P N/A 10/21/2016   Procedure: BiV Pacemaker Insertion CRT-P;  Surgeon: Evans Lance, MD;  Location: [place] [facility];  Service: Cardiovascular;  Laterality: N/A;  . CARDIOVERSION N/A 10/29/2015   Procedure: CARDIOVERSION;  Surgeon: Adrian Prows, MD;  Location: [facility] ENDOSCOPY;  Service: Cardiovascular;  Laterality: N/A;  . COLON RESECTION  12/25/2011   Procedure: COLON RESECTION LAPAROSCOPIC;  Surgeon: Shann Medal, MD;  Location: [place];  Service: General;  Laterality: Right;  laparoscopic assisted right colon resection  . COLON SURGERY    . COLONOSCOPY  11/11/2011   Procedure: COLONOSCOPY;  Surgeon: Missy Sabins, MD;  Location: [place];  Service: Endoscopy;  Laterality: N/A;  . CORONARY ANGIOPLASTY WITH STENT PLACEMENT  11/11/2010   "1"  . CYSTECTOMY Right ~ 1980    eyelid  . INGUINAL HERNIA REPAIR Bilateral   . TEE WITHOUT CARDIOVERSION  11/06/2011   Procedure: TRANSESOPHAGEAL ECHOCARDIOGRAM (TEE);  Surgeon: Laverda Page, MD;  Location: [place];  Service: Cardiovascular;  Laterality:  N/A;  . TEE WITHOUT CARDIOVERSION N/A 10/20/2016   Procedure: TRANSESOPHAGEAL ECHOCARDIOGRAM (TEE);  Surgeon: Dorothy Spark, MD;  Location: [facility] ENDOSCOPY;  Service: Cardiovascular;  Laterality: N/A;    Prior to Admission medications   Medication Sig Start Date End Date Taking? Authorizing Provider  albuterol (PROVENTIL HFA;VENTOLIN HFA) 108 (90 Base) MCG/ACT inhaler Inhale 1-2 puffs into the lungs every 6 (six) hours as needed for  wheezing or shortness of breath. 07/07/16  Yes Mabe, Shanon Brow, NP  apixaban (ELIQUIS) 5 MG TABS tablet Take 5 mg by mouth 2 (two) times daily.   Yes [provider]  Ascorbic Acid (VITAMIN C) 500 MG tablet Take 500 mg by mouth 2 (two) times daily.     Yes [provider]  b complex vitamins tablet Take 1 tablet by mouth daily.   Yes [provider]  Bilberry 1000 MG CAPS Take 1,000 mg by mouth daily.    Yes [provider]  Cholecalciferol (VITAMIN D PO) Take 1 capsule by mouth daily.   Yes [provider]  Coenzyme Q10 (EQL COQ10) 300 MG CAPS Take 1 capsule by mouth every morning.    Yes [provider]  diphenhydrAMINE (BENADRYL) 25 MG tablet Take 25 mg by mouth daily as needed for itching.   Yes [provider]  Ferrous Sulfate (IRON) 325 (65 Fe) MG TABS Take 325 mg by mouth 2 (two) times daily.    Yes [provider]  furosemide (LASIX) 20 MG tablet Take 1 tablet (20 mg total) by mouth daily. 10/29/16 02/01/17 Yes Baldwin Jamaica, PA-C  Glucosamine HCl 1000 MG TABS Take 1,000 mg by mouth 2 (two) times daily.    Yes [provider]  HAWTHORN BERRY PO Take 1 capsule by mouth every morning.    Yes [provider]  HORSE CHESTNUT PO Take 1 capsule by mouth daily.   Yes [provider]  hydrocortisone cream 1 % Apply 1 application topically daily as needed for itching.   Yes [provider]  ipratropium (ATROVENT) 0.06 % nasal spray 2 sprays into each nostril 4 times a day as needed for runny nose. Patient taking differently: Place 2 sprays into both nostrils 4 (four) times daily as needed for rhinitis.  07/07/16  Yes Mabe, Shanon Brow, NP  metoprolol succinate (TOPROL-XL) 25 MG 24 hr tablet Take 25 mg by mouth daily.   Yes [provider]  Multiple Vitamin (MULTIVITAMIN WITH MINERALS) TABS Take 1 tablet by mouth every morning.    Yes [provider]  omeprazole (PRILOSEC) 20 MG capsule  Take 20 mg by mouth daily. 01/29/17  Yes [provider]  Polyethyl Glycol-Propyl Glycol (SYSTANE) 0.4-0.3 % SOLN Place 1 drop into both eyes 2 (two) times daily.    Yes [provider]  pravastatin (PRAVACHOL) 40 MG tablet Take 40 mg by mouth at bedtime.    Yes [provider]  saw palmetto 160 MG capsule Take 160 mg by mouth 2 (two) times daily.    Yes [provider]  vitamin E 400 UNIT capsule Take 400 Units by mouth daily.    Yes [provider]  VITAMIN K PO Take 1 capsule by mouth daily.   Yes [provider]  imiquimod (ALDARA) 5 % cream Apply 1 application topically at bedtime. 10/13/16   [provider]  triamcinolone cream (KENALOG) 0.1 % Apply 1 application topically 2 (two) times daily. Patient not taking: Reported on 02/01/2017 09/16/16   Vanessa Kick,  MD    Scheduled Meds: . [START ON 02/05/2017] pantoprazole  40 mg Intravenous Q12H   Continuous Infusions: . sodium chloride 50 mL/hr at 02/02/17 0018  . sodium chloride    . pantoprozole (PROTONIX) infusion 8 mg/hr (02/02/17 0013)   PRN Meds:.acetaminophen **OR** acetaminophen  Allergies as of 02/01/2017 - Review Complete 02/01/2017  Allergen Reaction Noted  . Adhesive [tape] Itching and Rash 08/31/2016  . Brilinta [ticagrelor] Itching and Rash 01/13/2015  . Penicillins Rash 05/07/2010    Family History  Problem Relation Age of Onset  . Hypertension Mother   . Heart failure Father   . Cancer Neg Hx   . Diabetes Neg Hx   . Hyperlipidemia Neg Hx   . Sudden death Neg Hx   . Stroke Neg Hx   . Heart attack Neg Hx     Social History   Social History  . Marital status: Widowed    Spouse name: N/A  . Number of children: N/A  . Years of education: N/A   Occupational History  . Not on file.   Social History Main Topics  . Smoking status: Never Smoker  . Smokeless tobacco: Never Used     Comment: 12/24/2011 "used to puff cigarettes; never inhaled"  .  Alcohol use Yes     Comment: 10/21/2016 "don't drink at all anymore; used to have a beer q 3-4 months"  . Drug use: No  . Sexual activity: No   Other Topics Concern  . Not on file   Social History Narrative  . No narrative on file    Review of Systems: All negative except as stated above in HPI.  Physical Exam: Vital signs: Vitals:   02/02/17 0740 02/02/17 0902  BP: (!) 76/56 (!) 85/48  Pulse: 70   Resp: 16   Temp: 98.1 F (36.7 C) 98 F (36.7 C)  SpO2: 94% 98%   Last BM Date: 02/02/17 General:   Pale, lethargic, Well-developed, well-nourished, pleasant and cooperative in NAD Head: normocephalic, atraumatic Eyes: anicteric sclera ENT: oropharynx clear Neck: supple, nontender Lungs:  Clear throughout to auscultation.   No wheezes, crackles, or rhonchi. No acute distress. Heart:  Regular rate and rhythm; no murmurs, clicks, rubs,  or gallops. Abdomen: soft, nontender, nondistended, +BS  Rectal:  Deferred Ext: trace LE edema  GI:  Lab Results:  Recent Labs  02/01/17 1500 02/02/17 0018  WBC 4.1 4.4  HGB 9.0* 9.1*  HCT 27.3* 27.0*  PLT 87* 72*   BMET  Recent Labs  02/01/17 1500 02/02/17 0018  NA 138 138  K 4.2 3.9  CL 109 108  CO2 22 23  GLUCOSE 120* 110*  BUN 40* 36*  CREATININE 1.31* 1.29*  CALCIUM 8.4* 8.3*   LFT  Recent Labs  02/01/17 1500  PROT 5.7*  ALBUMIN 3.5  AST 34  ALT 16*  ALKPHOS 48  BILITOT 1.0   PT/INR No results for input(s): LABPROT, INR in the last 72 hours.   Studies/Results: No results found.  Impression/Plan: GI bleed with melenic stools and symptomatic anemia following gastric and duodenal biopsies on Eliquis. Hold anticoagulation for now. If melena continues, then may need a repeat EGD to evaluate the biopsy sites. May need to see if another anticoagulation option per cardiology although ideally he needs to be off all anticoagulation for right now. Clear liquid diet today. NPO p MN in case repeat EGD needed tomorrow  but if Hgb stable and melena stops then will hold off on a repeat  EGD. Will follow.    LOS: 1 day   [place] C.  02/02/2017, 9:24 AM  Pager [phone]  AFTER 5 pm or on weekends please call [phone]

## 2017-02-02 NOTE — Progress Notes (Addendum)
Pt c/o SOB on minimal exertion. 2L [place] SpO2 93% Crackles noted in bases. Pt also c/o nasal drip. Would like to be restarted on home medication. Pt also reports he has been "told by Dr. Einar Gip that he has CHF". Notified provider on call. New orders placed. Fluids reduced. Will continue to monitor.

## 2017-02-02 NOTE — Progress Notes (Signed)
BP still in 70's after [place]. Pt reports home BP 80-100. Notified Provider on call.

## 2017-02-02 NOTE — Progress Notes (Signed)
[facility] TEAM 1 - Stepdown/ICU TEAM  [name]  UMP:[id number] DOB: [birth year] DOA: 02/01/2017 PCP: [name], MD    Brief Narrative:  76 y.o. male with a history of HTN, GERD, atrial fib s/p ablation, and pacemaker.  He reportedly had both an EGD and colonoscopy on 10/26, and was told he could resume his eliquis that night.  He had 1 episode of dark BM on 10/27, but on 10/28 he had 4 dark stools and began to feel very SOB w/ exertion.    In the ER he was found to have heme positive stools.  Hgb has been 11.9 at the GI office 12/30/16.  Subjective: Pt says he feels "washed out" and "very tired."  He denies having a BM thus far today.  No chest pain, n/v, or sob.    Assessment & Plan:  GIB - Melena  GI following - likely due to bleeding from bx site w/ use of Eliquis - anticoag on hold - EGD in AM if bleeding persists   Acute blood loss anemia Due to above - follow Hgb in serial fashion   Recent Labs Lab 02/01/17 1500 02/02/17 0018  HGB 9.0* 9.1*   Hypotension Baseline SBP appears to be 80-100 - pt confirms this - appears slightly lower at this time - hydrate and follow   CKD Baseline crt appears to be ~1.5 as of May 2018 - crt stable at this time   CAD s/p stenting 2012  Afib s/p ablation - s/p pacer  DVT prophylaxis: SCDs Code Status: FULL CODE Family Communication: no family present at time of exam  Disposition Plan:   Consultants:  [facility]  Procedures: none  Antimicrobials:  none   Objective: Blood pressure (!) 73/46, pulse 70, temperature 97.9 F (36.6 C), temperature source Oral, resp. rate 16, height 6' (1.829 m), weight 77.8 kg (171 lb 9.6 oz), SpO2 94 %.  Intake/Output Summary (Last 24 hours) at 02/02/17 1342 Last data filed at 02/02/17 1300  Gross per 24 hour  Intake          1991.67 ml  Output              150 ml  Net          1841.67 ml   Filed Weights   02/01/17 1858 02/02/17 0405  Weight: 80.4 kg (177 lb 4.8 oz) 77.8 kg (171  lb 9.6 oz)    Examination: General: No acute respiratory distress Lungs: Clear to auscultation bilaterally without wheezes or crackles Cardiovascular: Regular rate and rhythm with 3/6 holosystolic M  Abdomen: Nontender, nondistended, soft, bowel sounds positive, no rebound, no ascites, no appreciable mass Extremities: No significant cyanosis, clubbing, or edema bilateral lower extremities  CBC:  Recent Labs Lab 02/01/17 1500 02/02/17 0018  WBC 4.1 4.4  NEUTROABS 3.1  --   HGB 9.0* 9.1*  HCT 27.3* 27.0*  MCV 93.5 92.5  PLT 87* 72*   Basic Metabolic Panel:  Recent Labs Lab 02/01/17 1500 02/02/17 0018  NA 138 138  K 4.2 3.9  CL 109 108  CO2 22 23  GLUCOSE 120* 110*  BUN 40* 36*  CREATININE 1.31* 1.29*  CALCIUM 8.4* 8.3*   GFR: Estimated Creatinine Clearance: 55.1 mL/min (A) (by C-G formula based on SCr of 1.29 mg/dL (H)).  Liver Function Tests:  Recent Labs Lab 02/01/17 1500  AST 34  ALT 16*  ALKPHOS 48  BILITOT 1.0  PROT 5.7*  ALBUMIN 3.5    Recent Results (  from the past 240 hour(s))  MRSA PCR Screening     Status: None   Collection Time: 02/02/17 12:35 AM  Result Value Ref Range Status   MRSA by PCR NEGATIVE NEGATIVE Final    Comment:        The GeneXpert MRSA Assay (FDA approved for NASAL specimens only), is one component of a comprehensive MRSA colonization surveillance program. It is not intended to diagnose MRSA infection nor to guide or monitor treatment for MRSA infections.      Scheduled Meds: . [START ON 02/05/2017] pantoprazole  40 mg Intravenous Q12H     LOS: 1 day   Cherene Altes, MD [facility] Office  [phone] Pager - Text Page per Amion as per below:  On-Call/Text Page:      [email or web address]      password [email or web address]  If 7PM-7AM, please contact night-coverage [email or web address] Password [email or web address] 02/02/2017, 1:42 PM

## 2017-02-02 NOTE — Progress Notes (Signed)
Pt BP 73/55. S/p 1 unit PRBC. MAP 62. Pt asymptomatic. Notified on call provider

## 2017-02-03 ENCOUNTER — Inpatient Hospital Stay (HOSPITAL_COMMUNITY): Payer: Medicare Other

## 2017-02-03 DIAGNOSIS — I351 Nonrheumatic aortic (valve) insufficiency: Secondary | ICD-10-CM

## 2017-02-03 DIAGNOSIS — I5032 Chronic diastolic (congestive) heart failure: Secondary | ICD-10-CM

## 2017-02-03 DIAGNOSIS — I051 Rheumatic mitral insufficiency: Secondary | ICD-10-CM

## 2017-02-03 DIAGNOSIS — D62 Acute posthemorrhagic anemia: Secondary | ICD-10-CM

## 2017-02-03 DIAGNOSIS — J810 Acute pulmonary edema: Secondary | ICD-10-CM

## 2017-02-03 DIAGNOSIS — E861 Hypovolemia: Secondary | ICD-10-CM

## 2017-02-03 LAB — COMPREHENSIVE METABOLIC PANEL
ALBUMIN: 3.5 g/dL (ref 3.5–5.0)
ALK PHOS: 55 U/L (ref 38–126)
ALT: 16 U/L — AB (ref 17–63)
ANION GAP: 11 (ref 5–15)
AST: 40 U/L (ref 15–41)
BUN: 29 mg/dL — ABNORMAL HIGH (ref 6–20)
CALCIUM: 8.6 mg/dL — AB (ref 8.9–10.3)
CHLORIDE: 110 mmol/L (ref 101–111)
CO2: 18 mmol/L — AB (ref 22–32)
CREATININE: 1.39 mg/dL — AB (ref 0.61–1.24)
GFR calc Af Amer: 56 mL/min — ABNORMAL LOW (ref >60)
GFR calc non Af Amer: 48 mL/min — ABNORMAL LOW (ref >60)
GLUCOSE: 142 mg/dL — AB (ref 65–99)
Potassium: 3.7 mmol/L (ref 3.5–5.1)
SODIUM: 139 mmol/L (ref 135–145)
Total Bilirubin: 2.2 mg/dL — ABNORMAL HIGH (ref 0.3–1.2)
Total Protein: 5.8 g/dL — ABNORMAL LOW (ref 6.5–8.1)

## 2017-02-03 LAB — CBC
HCT: 30.6 % — ABNORMAL LOW (ref 39.0–52.0)
HEMOGLOBIN: 10.1 g/dL — AB (ref 13.0–17.0)
MCH: 30.9 pg (ref 26.0–34.0)
MCHC: 33 g/dL (ref 30.0–36.0)
MCV: 93.6 fL (ref 78.0–100.0)
Platelets: 82 10*3/uL — ABNORMAL LOW (ref 150–400)
RBC: 3.27 MIL/uL — AB (ref 4.22–5.81)
RDW: 15.9 % — ABNORMAL HIGH (ref 11.5–15.5)
WBC: 13 10*3/uL — AB (ref 4.0–10.5)

## 2017-02-03 MED ORDER — FUROSEMIDE 10 MG/ML IJ SOLN
20.0000 mg | Freq: Once | INTRAMUSCULAR | Status: AC
  Administered 2017-02-03: 20 mg via INTRAVENOUS
  Filled 2017-02-03: qty 2

## 2017-02-03 MED ORDER — FUROSEMIDE 10 MG/ML IJ SOLN
40.0000 mg | Freq: Once | INTRAMUSCULAR | Status: AC
  Administered 2017-02-03: 40 mg via INTRAVENOUS
  Filled 2017-02-03: qty 4

## 2017-02-03 NOTE — Progress Notes (Addendum)
PROGRESS NOTE    [name]  HKV:[id number] DOB: July 25, 1940 DOA: 02/01/2017 PCP: [name], MD   Brief Narrative:  76 y.o. WM PMHx Anemia, Chronic lower back pain, MI, CAD, Endocarditis, Heart murmur, HTN, Atrial Fibrillation S/P Ablation S/P Pacemaker HLD,  He reportedly had both an EGD and colonoscopy on 10/26, and was told he could resume his eliquis that night.  He had 1 episode of dark BM on 10/27, but on 10/28 he had 4 dark stools and began to feel very SOB w/ exertion.     In the ER he was found to have heme positive stools.  Hgb has been 11.9 at the GI office 12/30/16.      Subjective: 10/31 A/O 4, positive acute respiratory failure with hypoxia, negative CP, negative abdominal pain. States respiratory distress began after we provided significant fluid in an attempt to raise BP.states sees Dr. Einar Gip Cardiology Assessment & Plan:   Active Problems:   HTN (hypertension)   Atrial fibrillation (HCC)   GI bleed  Chronic Diastolic CHF?/moderate AV and MV regurgitation -unknown base weight -Strict in and out -Daily weight -Fluid restriction 1275ml/dy. NOTE ill not be able to significantly increase patient's BP with fluids secondary to MV/AV regurgitation , will only results in pulmonary edema -Lasix 40 mg 1 -Echocardiogram pending -11/1 PCXR -May need to consult Dr. Einar Gip dependent on findings of echocardiogram do valves need to be replaced? Is patient candidate for valve replacement?  Chronic Atrial fibrillation -S/P ablation with pacer placement -Currently in NSR  Hypotension -Secondary to CHF/AV/MV valve regurgitation -Asymptomatic  Pulmonary edema -See CHF  CAD  -s/p stenting 2012  GI bleed/ Acute blood loss anemia  -GI bleed S/P recent EGD/colonoscopy with biopsies while on Eliquis -Continue to hold Eliquis per GI until 11/1. If stable no need for additional procedure     Recent Labs Lab 02/01/17 1500 02/02/17 0018 02/02/17 1834 02/03/17 0348    HGB 9.0* 9.1* 9.5* 10.1*    CKD(baseline Cr ~1.5)           DVT prophylaxis: SCD Code Status: Full Family Communication: None available Disposition Plan: TBD   Consultants:  [facility]  Procedures/Significant Events:  None   I have personally reviewed and interpreted all radiology studies and my findings are as above.  VENTILATOR SETTINGS: None   Cultures None  Antimicrobials: None   Devices    LINES / TUBES:      Continuous Infusions: . pantoprozole (PROTONIX) infusion 8 mg/hr (02/02/17 2254)     Objective: Vitals:   02/03/17 0000 02/03/17 0114 02/03/17 0120 02/03/17 0509  BP: (!) 85/59   (!) 92/59  Pulse:    70  Resp:    16  Temp:    98.4 F (36.9 C)  TempSrc:    Oral  SpO2: 90% 91% 91% (!) 61%  Weight:    170 lb 11.2 oz (77.4 kg)  Height:        Intake/Output Summary (Last 24 hours) at 02/03/17 0853 Last data filed at 02/03/17 0600  Gross per 24 hour  Intake          2063.76 ml  Output             1100 ml  Net           963.76 ml   Filed Weights   02/01/17 1858 02/02/17 0405 02/03/17 0509  Weight: 177 lb 4.8 oz (80.4 kg) 171 lb 9.6 oz (77.8 kg) 170 lb 11.2 oz (77.4  kg)    Examination:  General: A/O 4, positive acute respiratory distress Neck:  Negative scars, masses, torticollis, lymphadenopathy, JVD Lungs: positive bilateral diffuse rhonchi, negative wheezes negative crackles Cardiovascular: Regular rate and rhythm(paced), positive holosystolic murmur, negative gallop or rub normal S1 and S2 Abdomen: negative abdominal pain, nondistended, positive soft, bowel sounds, no rebound, no ascites, no appreciable mass Extremities: No significant cyanosis, clubbing, or edema bilateral lower extremities Skin: Negative rashes, lesions, ulcers Psychiatric:  Negative depression, negative anxiety, negative fatigue, negative mania  Central nervous system:  Cranial nerves II through XII intact, tongue/uvula midline, all extremities muscle  strength 5/5, sensation intact throughout,  negative dysarthria, negative expressive aphasia, negative receptive aphasia.  .     Data Reviewed: Care during the described time interval was provided by me .  I have reviewed this patient's available data, including medical history, events of note, physical examination, and all test results as part of my evaluation.   CBC:  Recent Labs Lab 02/01/17 1500 02/02/17 0018 02/02/17 1834 02/03/17 0348  WBC 4.1 4.4 8.4 13.0*  NEUTROABS 3.1  --   --   --   HGB 9.0* 9.1* 9.5* 10.1*  HCT 27.3* 27.0* 28.8* 30.6*  MCV 93.5 92.5 93.8 93.6  PLT 87* 72* 83* 82*   Basic Metabolic Panel:  Recent Labs Lab 02/01/17 1500 02/02/17 0018 02/03/17 0348  NA 138 138 139  K 4.2 3.9 3.7  CL 109 108 110  CO2 22 23 18*  GLUCOSE 120* 110* 142*  BUN 40* 36* 29*  CREATININE 1.31* 1.29* 1.39*  CALCIUM 8.4* 8.3* 8.6*   GFR: Estimated Creatinine Clearance: 51 mL/min (A) (by C-G formula based on SCr of 1.39 mg/dL (H)). Liver Function Tests:  Recent Labs Lab 02/01/17 1500 02/03/17 0348  AST 34 40  ALT 16* 16*  ALKPHOS 48 55  BILITOT 1.0 2.2*  PROT 5.7* 5.8*  ALBUMIN 3.5 3.5   No results for input(s): LIPASE, AMYLASE in the last 168 hours. No results for input(s): AMMONIA in the last 168 hours. Coagulation Profile: No results for input(s): INR, PROTIME in the last 168 hours. Cardiac Enzymes: No results for input(s): CKTOTAL, CKMB, CKMBINDEX, TROPONINI in the last 168 hours. BNP (last 3 results) No results for input(s): PROBNP in the last 8760 hours. HbA1C: No results for input(s): HGBA1C in the last 72 hours. CBG: No results for input(s): GLUCAP in the last 168 hours. Lipid Profile: No results for input(s): CHOL, HDL, LDLCALC, TRIG, CHOLHDL, LDLDIRECT in the last 72 hours. Thyroid Function Tests: No results for input(s): TSH, T4TOTAL, FREET4, T3FREE, THYROIDAB in the last 72 hours. Anemia Panel: No results for input(s): VITAMINB12, FOLATE,  FERRITIN, TIBC, IRON, RETICCTPCT in the last 72 hours. Urine analysis:    Component Value Date/Time   COLORURINE YELLOW 02/01/2017 [place] 02/01/2017 1653   LABSPEC 1.025 02/01/2017 1653   PHURINE 5.0 02/01/2017 1653   GLUCOSEU NEGATIVE 02/01/2017 1653   HGBUR NEGATIVE 02/01/2017 [place] 02/01/2017 1653   KETONESUR 20 (A) 02/01/2017 1653   PROTEINUR NEGATIVE 02/01/2017 1653   UROBILINOGEN 0.2 01/13/2015 2015   NITRITE NEGATIVE 02/01/2017 1653   LEUKOCYTESUR NEGATIVE 02/01/2017 1653   Sepsis Labs: @LABRCNTIP (procalcitonin:4,lacticidven:4)  ) Recent Results (from the past 240 hour(s))  MRSA PCR Screening     Status: None   Collection Time: 02/02/17 12:35 AM  Result Value Ref Range Status   MRSA by PCR NEGATIVE NEGATIVE Final    Comment:  The GeneXpert MRSA Assay (FDA approved for NASAL specimens only), is one component of a comprehensive MRSA colonization surveillance program. It is not intended to diagnose MRSA infection nor to guide or monitor treatment for MRSA infections.          Radiology Studies: Dg Chest Port 1 View  Result Date: 02/03/2017 CLINICAL DATA:  Shortness of breath tonight. EXAM: PORTABLE CHEST 1 VIEW COMPARISON:  Radiograph 10/22/2016 FINDINGS: Left-sided pacemaker in place. Stable cardiomegaly. Development of diffuse interstitial and alveolar opacities throughout the right lung. Bilateral pleural effusions with fluid in the right minor fissure. No pneumothorax. IMPRESSION: Diffuse interstitial and alveolar opacities throughout the right lung, which in conjunction with cardiomegaly and pleural effusions are suspicious for pulmonary edema and CHF. Alternatively, atypical pneumonia is considered but felt less likely. Electronically Signed   By: Jeb Levering M.D.   On: 02/03/2017 01:49        Scheduled Meds: . ipratropium  2 spray Each Nare QID  . [START ON 02/05/2017] pantoprazole  40 mg Intravenous  Q12H   Continuous Infusions: . pantoprozole (PROTONIX) infusion 8 mg/hr (02/02/17 2254)     LOS: 2 days    Time spent: 40 minutes    WOODS, Geraldo Docker, MD [facility] Pager [phone]   If 7PM-7AM, please contact night-coverage [email or web address] Password [email or web address] 02/03/2017, 8:53 AM

## 2017-02-03 NOTE — Progress Notes (Signed)
Pt having increased SOB at rest. Requiring increased O2 demand. 88 on 2L [place]. Coughing up large amount of pink sputum. Fluids stopped. Notified MD. Per Pt, he on on 20 mg lasix, daily, at home. New orders placed by provider. Will continue to monitor closely.

## 2017-02-03 NOTE — Progress Notes (Signed)
Post IV lasix. Pt reports "feeling better" Less SOB at rest. O2 >92 on 4-5 L. Will continue to monitor.

## 2017-02-03 NOTE — Progress Notes (Signed)
[facility] Gastroenterology Progress Note  [name] 76 y.o. Apr 13, 1940   Subjective: Feels ok. No BMs or bleeding since admit. Lying in bed. Reports occasional right inguinal pain yesterday.  Objective: Vital signs: Vitals:   02/03/17 0120 02/03/17 0509  BP:  (!) 92/59  Pulse:  70  Resp:  16  Temp:  98.4 F (36.9 C)  SpO2: 91% (!) 61%    Physical Exam: Gen: alert, no acute distress, elderly, thin HEENT: anicteric sclera CV: RRR Chest: CTA B Abd: soft, nontender, nondistended, +BS Ext: no edema  Lab Results:  Recent Labs  02/02/17 0018 02/03/17 0348  NA 138 139  K 3.9 3.7  CL 108 110  CO2 23 18*  GLUCOSE 110* 142*  BUN 36* 29*  CREATININE 1.29* 1.39*  CALCIUM 8.3* 8.6*    Recent Labs  02/01/17 1500 02/03/17 0348  AST 34 40  ALT 16* 16*  ALKPHOS 48 55  BILITOT 1.0 2.2*  PROT 5.7* 5.8*  ALBUMIN 3.5 3.5    Recent Labs  02/01/17 1500  02/02/17 1834 02/03/17 0348  WBC 4.1  < > 8.4 13.0*  NEUTROABS 3.1  --   --   --   HGB 9.0*  < > 9.5* 10.1*  HCT 27.3*  < > 28.8* 30.6*  MCV 93.5  < > 93.8 93.6  PLT 87*  < > 83* 82*  < > = values in this interval not displayed.    Assessment/Plan: Symptomatic anemia and melena that has resolved. Hgb 10.1. Soft diet. No plans for repeat endoscopic procedures at this time. Hold Eliquis until tomorrow. D/C tomorrow if stable. May need PT to help with ambulation but defer to primary team. Supportive care. Will sign off. Call if questions.   [place] C. 02/03/2017, 8:55 AM  Pager [phone]  AFTER 5 PM or on weekends please call [phone] ID: [name], male   DOB: [birth year], 76 y.o.   MRN: [id number]

## 2017-02-03 NOTE — Plan of Care (Signed)
Problem: Pain Managment: Goal: General experience of comfort will improve Outcome: Progressing No pain reported thus far.  Will continue to monitor.

## 2017-02-03 NOTE — Progress Notes (Signed)
CXR resulted. Notified Provider on call. Lasix ordered. Will continue to monitor.

## 2017-02-03 NOTE — Progress Notes (Signed)
Per provider. Continue Protonix gtt at set rate. Will continue to monitor.

## 2017-02-04 ENCOUNTER — Inpatient Hospital Stay (HOSPITAL_COMMUNITY): Payer: Medicare Other

## 2017-02-04 DIAGNOSIS — K2930 Chronic superficial gastritis without bleeding: Secondary | ICD-10-CM | POA: Diagnosis not present

## 2017-02-04 DIAGNOSIS — J811 Chronic pulmonary edema: Secondary | ICD-10-CM

## 2017-02-04 LAB — CBC
HCT: 26.7 % — ABNORMAL LOW (ref 39.0–52.0)
HEMOGLOBIN: 8.9 g/dL — AB (ref 13.0–17.0)
MCH: 31.4 pg (ref 26.0–34.0)
MCHC: 33.3 g/dL (ref 30.0–36.0)
MCV: 94.3 fL (ref 78.0–100.0)
PLATELETS: 73 10*3/uL — AB (ref 150–400)
RBC: 2.83 MIL/uL — AB (ref 4.22–5.81)
RDW: 16.1 % — ABNORMAL HIGH (ref 11.5–15.5)
WBC: 10.3 10*3/uL (ref 4.0–10.5)

## 2017-02-04 LAB — ECHOCARDIOGRAM COMPLETE
AOVTI: 152 cm
AV Peak grad: 172 mmHg
AVG: 90 mmHg
AVPKVEL: 656 cm/s
Ao-asc: 33 cm
CHL CUP MV DEC (S): 190
CHL CUP TV REG PEAK VELOCITY: 401 cm/s
DOP CAL AO MEAN VELOCITY: 424 cm/s
EWDT: 190 ms
FS: 34 % (ref 28–44)
Height: 72 in
IVS/LV PW RATIO, ED: 0.92
LA ID, A-P, ES: 65 mm
LA vol index: 113.2 mL/m2
LA vol: 225 mL
LADIAMINDEX: 3.27 cm/m2
LAVOLA4C: 222 mL
LDCA: 1.77 cm2
LEFT ATRIUM END SYS DIAM: 65 mm
LVOT diameter: 15 mm
MV VTI: 208 cm
MV pk A vel: 62.4 m/s
MVPG: 18 mmHg
MVPKEVEL: 212 m/s
P 1/2 time: 344 ms
PISA EROA: 0.1 cm2
PV Reg vel dias: 98.6 cm/s
PW: 14.9 mm — AB (ref 0.6–1.1)
RV sys press: 79 mmHg
TAPSE: 16.3 mm
TRMAXVEL: 401 cm/s
Weight: 2739.2 oz

## 2017-02-04 LAB — BASIC METABOLIC PANEL
ANION GAP: 8 (ref 5–15)
BUN: 28 mg/dL — AB (ref 6–20)
CO2: 25 mmol/L (ref 22–32)
Calcium: 8.2 mg/dL — ABNORMAL LOW (ref 8.9–10.3)
Chloride: 107 mmol/L (ref 101–111)
Creatinine, Ser: 1.48 mg/dL — ABNORMAL HIGH (ref 0.61–1.24)
GFR calc Af Amer: 52 mL/min — ABNORMAL LOW (ref >60)
GFR calc non Af Amer: 45 mL/min — ABNORMAL LOW (ref >60)
GLUCOSE: 125 mg/dL — AB (ref 65–99)
POTASSIUM: 3.2 mmol/L — AB (ref 3.5–5.1)
Sodium: 140 mmol/L (ref 135–145)

## 2017-02-04 LAB — MAGNESIUM: Magnesium: 1.7 mg/dL (ref 1.7–2.4)

## 2017-02-04 MED ORDER — PANTOPRAZOLE SODIUM 40 MG IV SOLR
40.0000 mg | Freq: Two times a day (BID) | INTRAVENOUS | Status: DC
  Administered 2017-02-04 – 2017-02-05 (×2): 40 mg via INTRAVENOUS
  Filled 2017-02-04 (×2): qty 40

## 2017-02-04 MED ORDER — ALBUTEROL SULFATE (2.5 MG/3ML) 0.083% IN NEBU
2.5000 mg | INHALATION_SOLUTION | Freq: Four times a day (QID) | RESPIRATORY_TRACT | Status: DC | PRN
  Administered 2017-02-04: 2.5 mg via RESPIRATORY_TRACT
  Filled 2017-02-04: qty 3

## 2017-02-04 MED ORDER — MAGNESIUM SULFATE 2 GM/50ML IV SOLN
2.0000 g | Freq: Once | INTRAVENOUS | Status: AC
  Administered 2017-02-04: 2 g via INTRAVENOUS
  Filled 2017-02-04: qty 50

## 2017-02-04 MED ORDER — POTASSIUM CHLORIDE 20 MEQ PO PACK
40.0000 meq | PACK | Freq: Once | ORAL | Status: AC
  Administered 2017-02-04: 40 meq via ORAL
  Filled 2017-02-04: qty 2

## 2017-02-04 MED ORDER — DEXTROSE 5 % IV SOLN
2.0000 g | Freq: Once | INTRAVENOUS | Status: AC
  Administered 2017-02-04: 2 g via INTRAVENOUS
  Filled 2017-02-04: qty 2

## 2017-02-04 MED ORDER — VANCOMYCIN HCL 10 G IV SOLR
1500.0000 mg | Freq: Once | INTRAVENOUS | Status: AC
  Administered 2017-02-04: 1500 mg via INTRAVENOUS
  Filled 2017-02-04: qty 1500

## 2017-02-04 MED ORDER — VANCOMYCIN HCL IN DEXTROSE 750-5 MG/150ML-% IV SOLN
750.0000 mg | Freq: Two times a day (BID) | INTRAVENOUS | Status: DC
  Administered 2017-02-05: 750 mg via INTRAVENOUS
  Filled 2017-02-04 (×2): qty 150

## 2017-02-04 MED ORDER — FUROSEMIDE 10 MG/ML IJ SOLN
20.0000 mg | Freq: Once | INTRAMUSCULAR | Status: AC
  Administered 2017-02-05: 20 mg via INTRAVENOUS
  Filled 2017-02-04: qty 2

## 2017-02-04 MED ORDER — DEXTROSE 5 % IV SOLN
1.0000 g | Freq: Two times a day (BID) | INTRAVENOUS | Status: DC
  Administered 2017-02-05 – 2017-02-06 (×3): 1 g via INTRAVENOUS
  Filled 2017-02-04 (×3): qty 1

## 2017-02-04 NOTE — NC FL2 (Addendum)
[place] LEVEL OF CARE SCREENING TOOL     IDENTIFICATION  Patient Name: [name] Birthdate: 01-23-1941 Sex: male Admission Date (Current Location): 02/01/2017  [facility] and [place] Number:  Herbalist and Address:  [facility] [place], [place] [address], [place], [place] [address]      Provider Number: [id number]  Attending Physician Name and Address:  Bonnell Public, MD  Relative Name and Phone Number:  Belenda Cruise, daughter, [phone]    Current Level of Care: Hospital Recommended Level of Care: [place] Prior Approval Number:    Date Approved/Denied:   PASRR Number: [id number]  Discharge Plan: SNF    Current Diagnoses: Patient Active Problem List   Diagnosis Date Noted  . GI bleed 02/01/2017  . Atrial fibrillation ([place]) 10/22/2016  . Typical atrial flutter ([place]) 08/12/2016  . Atrial flutter ([place]) 08/12/2016  . Hypotension 05/06/2016  . Atrial fibrillation with RVR ([place]) 05/06/2016  . AKI (acute kidney injury) ([place]) 05/06/2016  . SVT (supraventricular tachycardia) ([place]) 01/31/2015  . Colonic mass, right colon. 11/10/2011  . Acute bacterial endocarditis 11/06/2011  . HTN (hypertension) 11/06/2011  . BPH (benign prostatic hyperplasia) 11/06/2011  . 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 Back pain 11/06/2011  . Hyperlipidemia 11/06/2011  . Acute renal insufficiency 08/25/2010  . Viridans streptococci infection 08/25/2010  . Streptococcus infection 08/25/2010  . Tooth infection 08/25/2010  . Status post PICC central line placement 08/25/2010  . Arm pain, right 08/25/2010  . ENDOCARDITIS, BACTERIAL, SUBACUTE 05/07/2010  . Iron deficiency anemia 05/06/2010  . Hypertrophic obstructive cardiomyopathy ([place]) 05/06/2010  . SYNCOPE 05/06/2010  . DYSPNEA ON EXERTION 05/06/2010  . ECHOCARDIOGRAM, ABNORMAL 05/06/2010  . HEART MURMUR, HX OF 05/06/2010    Orientation RESPIRATION BLADDER Height &  Weight     Self, Time, Situation, Place  O2 (nasal cannula 4L) Continent Weight: 171 lb 3.2 oz (77.7 kg) Height:  6' (182.9 cm)  BEHAVIORAL SYMPTOMS/MOOD NEUROLOGICAL BOWEL NUTRITION STATUS      Continent Diet (please see DC summary)  AMBULATORY STATUS COMMUNICATION OF NEEDS Skin   Extensive Assist Verbally Normal                       Personal Care Assistance Level of Assistance  Bathing, Feeding, Dressing Bathing Assistance: Limited assistance Feeding assistance: Independent Dressing Assistance: Limited assistance     Functional Limitations Info  Sight, Hearing, Speech Sight Info: Adequate (glasses) Hearing Info: Adequate Speech Info: Adequate    SPECIAL CARE FACTORS FREQUENCY  PT (By licensed PT), OT (By licensed OT)     PT Frequency: 5x/week OT Frequency: 5x/week            Contractures      Additional Factors Info  Code Status, Allergies Code Status Info: Full Allergies Info: Adhesive Tape, Brilinta Ticagrelor, Penicillins           Current Medications (02/04/2017):  This is the current hospital active medication list Current Facility-Administered Medications  Medication Dose Route Frequency Provider Last Rate Last Dose  . acetaminophen (TYLENOL) tablet 650 mg  650 mg Oral Q6H PRN Joette Catching T, MD      . albuterol (PROVENTIL) (2.5 MG/3ML) 0.083% nebulizer solution 3 mg  3 mg Inhalation Q6H PRN Cherene Altes, MD      . ipratropium (ATROVENT) 0.06 % nasal spray 2 spray  2 spray Each Nare QID Vertis Kelch, NP   2 spray at 02/03/17 2107  .  pantoprazole (PROTONIX) 80 mg in sodium chloride 0.9 % 250 mL (0.32 mg/mL) infusion  8 mg/hr Intravenous Continuous Eulogio Bear U, DO 25 mL/hr at 02/04/17 0802 8 mg/hr at 02/04/17 0802  . [START ON 02/05/2017] pantoprazole (PROTONIX) injection 40 mg  40 mg Intravenous Q12H Geradine Girt, DO         Discharge Medications: Please see discharge summary for a list of discharge  medications.  Relevant Imaging Results:  Relevant Lab Results:   Additional Information SSN: [id number]  Estanislado Emms, LCSW

## 2017-02-04 NOTE — Evaluation (Signed)
Occupational Therapy Evaluation Patient Details Name: AYOUB AREY MRN: [id number] DOB: 29-Mar-1941 Today's Date: 02/04/2017    History of Present Illness Pt admitted GIB, anemia, hypotension. PMH: HTN, afib s/p ablation, pacemaker, CAD with stenting, R hemicolectomy.   Clinical Impression   Pt reports functioning independently at baseline, but with a low activity tolerance. He denies any falls. Pt presents with generalized weakness, decreased standing balance and he fatigues easily with minimal exertion. Recommending ST rehab prior to return home, pt in agreement. Will follow.    Follow Up Recommendations  SNF;Supervision/Assistance - 24 hour    Equipment Recommendations       Recommendations for Other Services       Precautions / Restrictions Precautions Precautions: Fall Precaution Comments: watch BP Restrictions Weight Bearing Restrictions: No      Mobility Bed Mobility Overal bed mobility: Modified Independent             General bed mobility comments: HOB up, increased time  Transfers Overall transfer level: Needs assistance Equipment used: None Transfers: Sit to/from Stand Sit to Stand: Min guard         General transfer comment: no physical assist, but close guard needed    Balance Overall balance assessment: Needs assistance   Sitting balance-Leahy Scale: Good       Standing balance-Leahy Scale: Fair                             ADL either performed or assessed with clinical judgement   ADL                                               Vision Baseline Vision/History: Wears glasses Wears Glasses: At all times Patient Visual Report: No change from baseline       Perception     Praxis      Pertinent Vitals/Pain Pain Assessment: No/denies pain     Hand Dominance Right   Extremity/Trunk Assessment Upper Extremity Assessment Upper Extremity Assessment: Overall WFL for tasks assessed   Lower  Extremity Assessment Lower Extremity Assessment: Defer to PT evaluation       Communication Communication Communication: No difficulties   Cognition Arousal/Alertness: Awake/alert Behavior During Therapy: WFL for tasks assessed/performed Overall Cognitive Status: Within Functional Limits for tasks assessed                                     General Comments       Exercises     Shoulder Instructions      Home Living Family/patient expects to be discharged to:: Private residence Living Arrangements: Alone Available Help at Discharge: Family;Available 24 hours/day (grandson) Type of Home: House Home Access: Stairs to enter [employer] of Steps: 6 Entrance Stairs-Rails: Right;Left;Can reach both Home Layout: One level     Bathroom Shower/Tub: Tub/shower unit;Curtain   Bathroom Toilet: Handicapped height     Home Equipment: Environmental consultant - 2 wheels;Cane - single point          Prior Functioning/Environment Level of Independence: Independent        Comments: wife passed away in 2022/07/29, reports sometimes he is too tired to shower and sponge bathes seated at the sink        OT  Problem List: Decreased activity tolerance;Impaired balance (sitting and/or standing);Decreased strength;Decreased knowledge of use of DME or AE;Cardiopulmonary status limiting activity      OT Treatment/Interventions: Self-care/ADL training;DME and/or AE instruction;Energy conservation;Patient/family education;Balance training;Therapeutic activities    OT Goals(Current goals can be found in the care plan section) Acute Rehab OT Goals Patient Stated Goal: to regain his strength OT Goal Formulation: With patient Time For Goal Achievement: 02/18/17 Potential to Achieve Goals: Good ADL Goals Pt Will Perform Grooming: with supervision;standing (3 activities) Pt Will Perform Lower Body Bathing: with supervision;sit to/from stand Pt Will Perform Lower Body Dressing: with  supervision;sit to/from stand Pt Will Transfer to Toilet: with supervision;ambulating Pt Will Perform Toileting - Clothing Manipulation and hygiene: with supervision;sit to/from stand Additional ADL Goal #1: Pt will generalize energy conservation strategies in ADL with minimal verbal cues.  OT Frequency: Min 2X/week   Barriers to D/C:            Co-evaluation              AM-PAC PT "6 Clicks" Daily Activity     Outcome Measure Help from another person eating meals?: None Help from another person taking care of personal grooming?: A Little Help from another person toileting, which includes using toliet, bedpan, or urinal?: A Little Help from another person bathing (including washing, rinsing, drying)?: A Little Help from another person to put on and taking off regular upper body clothing?: A Little Help from another person to put on and taking off regular lower body clothing?: A Little 6 Click Score: 19   End of Session Equipment Utilized During Treatment: Gait belt  Activity Tolerance: Patient limited by fatigue Patient left: in bed (EOB eating breakfast)  OT Visit Diagnosis: Unsteadiness on feet (R26.81)                Time: 5956-3875 OT Time Calculation (min): 23 min Charges:  OT General Charges $OT Visit: 1 Visit OT Evaluation $OT Eval Moderate Complexity: 1 Mod OT Treatments $Self Care/Home Management : 8-22 mins G-Codes:     Malka So 02/04/2017, 10:09 AM  02/04/2017 Nestor Lewandowsky, OTR/L Pager: [phone]

## 2017-02-04 NOTE — Evaluation (Signed)
Physical Therapy Evaluation Patient Details Name: [name] MRN: [id number] DOB: 1940/11/25 Today's Date: 02/04/2017   History of Present Illness  Pt is 76 y.o. male admitted to hospital with GI bleed, anemia, and hypotension. PMH includes HTN, afib s/p ablation, pacemaker, CAD with stenting, R hemicolectomy.  Clinical Impression  Pt presents with limited activity tolerance, muscular weakness, dyspnea, and medical complications (low BP) limiting ability to ambulate. Upon PT arrival, pt presented supine in bed on 4 L/min O2 via nasal cannula. Pt's BP measured 77/54. Pt was removed from O2 and able to maintain normal SpO2 on room air throughout session. Pt demonstrates modified independent bed mobility, using hand rails to re-position himself in bed. Pt demonstrates ability to transfer from EOB sit to stand with min assist only, however pt's BP dropped to 66/48 after standing for 2-minutes. RN notified. At this time, PT session was ceased and pt returned to bed to prepare for echocardiogram. Pt's prior level of function is independent with home activities and community ambulation. Pt would benefit from skilled PT in order to return to prior level of function.     Follow Up Recommendations SNF    Equipment Recommendations       Recommendations for Other Services       Precautions / Restrictions Precautions Precautions: Fall Precaution Comments: watch BP Restrictions Weight Bearing Restrictions: No      Mobility  Bed Mobility Overal bed mobility: Modified Independent             General bed mobility comments: increased time needed, uses railing to move up in bed  Transfers Overall transfer level: Needs assistance Equipment used: 1 person hand held assist Transfers: Sit to/from Stand Sit to Stand: Min assist         General transfer comment: BP lowered from 77/54 to 66/48 after sit to stand transfer  Ambulation/Gait             General Gait Details:  deferred due to low BP  Stairs            Wheelchair Mobility    Modified Rankin (Stroke Patients Only)       Balance Overall balance assessment: Needs assistance Sitting-balance support: No upper extremity supported;Feet unsupported Sitting balance-Leahy Scale: Good       Standing balance-Leahy Scale: Fair                               Pertinent Vitals/Pain Pain Assessment: No/denies pain    Home Living Family/patient expects to be discharged to:: Private residence Living Arrangements: Alone Available Help at Discharge: Family;Available 24 hours/day (grandson) Type of Home: House Home Access: Stairs to enter Entrance Stairs-Rails: Right;Left;Can reach both Entrance Stairs-Number of Steps: 6 Home Layout: One level Home Equipment: Walker - 2 wheels;Cane - single point      Prior Function Level of Independence: Independent         Comments: wife passed away in 07/23/22, reports sometimes he is too tired to shower and sponge bathes seated at the sink     Hand Dominance   Dominant Hand: Right    Extremity/Trunk Assessment        Lower Extremity Assessment Lower Extremity Assessment:  (able to extend both knees, but quad tremor present. able to DF against gravity)       Communication   Communication: No difficulties  Cognition Arousal/Alertness: Awake/alert Behavior During Therapy: WFL for tasks assessed/performed Overall Cognitive Status:  Within Functional Limits for tasks assessed                                        General Comments General comments (skin integrity, edema, etc.): session limited due to drop in BP upon standing, Pt used bed railing for support while standing with min guard assist due to low BP and c/o dizziness.    Exercises     Assessment/Plan    PT Assessment Patient needs continued PT services  PT Problem List Decreased mobility;Decreased activity tolerance;Cardiopulmonary status limiting  activity;Decreased strength       PT Treatment Interventions Gait training;Balance training;Manual techniques;Therapeutic exercise;DME instruction;Stair training;Neuromuscular re-education;Modalities;Functional mobility training;Therapeutic activities;Patient/family education    PT Goals (Current goals can be found in the Care Plan section)  Acute Rehab PT Goals Patient Stated Goal: to get back to independence with functional activities around the house PT Goal Formulation: With patient Time For Goal Achievement: 02/18/17 Potential to Achieve Goals: Good    Frequency Min 2X/week   Barriers to discharge Decreased caregiver support pt lives alone    Co-evaluation               AM-PAC PT "6 Clicks" Daily Activity  Outcome Measure Difficulty turning over in bed (including adjusting bedclothes, sheets and blankets)?: A Little Difficulty moving from lying on back to sitting on the side of the bed? : Unable Difficulty sitting down on and standing up from a chair with arms (e.g., wheelchair, bedside commode, etc,.)?: Unable Help needed moving to and from a bed to chair (including a wheelchair)?: A Little Help needed walking in hospital room?: Total Help needed climbing 3-5 steps with a railing? : Total 6 Click Score: 10    End of Session Equipment Utilized During Treatment: Gait belt Activity Tolerance: Treatment limited secondary to medical complications (Comment) (BP drop) Patient left: in bed;with call bell/phone within reach Nurse Communication: Mobility status PT Visit Diagnosis: Muscle weakness (generalized) (M62.81);Difficulty in walking, not elsewhere classified (R26.2)    Time: 1000-1026 PT Time Calculation (min) (ACUTE ONLY): 26 min   Charges:   PT Evaluation $PT Eval Moderate Complexity: 1 Mod PT Treatments $Therapeutic Activity: 8-22 mins   PT G Codes:       Judee Clara, SPT  Judee Clara 02/04/2017, 12:27 PM

## 2017-02-04 NOTE — Progress Notes (Signed)
[facility] Gastroenterology Progress Note  [name] 76 y.o. Nov 16, 1940   Subjective: Denies abdominal pain. Lying in bed. Reports black tarry stool today which was his first stool since admit. Feels weak with standing. Son in room.  Objective: Vital signs: Vitals:   02/04/17 0745 02/04/17 1248  BP: (!) 81/58 (!) 94/57  Pulse:    Resp:    Temp: 98.6 F (37 C) 99.8 F (37.7 C)  SpO2: 97% 98%  P 70, R 24  Physical Exam: Gen: lethargic, elderly, no acute distress  HEENT: anicteric sclera CV: RRR Chest: CTA B Abd: soft, nontender, nondistended, +BS Ext: no edema  Lab Results:  Recent Labs  02/03/17 0348 02/04/17 0447  NA 139 140  K 3.7 3.2*  CL 110 107  CO2 18* 25  GLUCOSE 142* 125*  BUN 29* 28*  CREATININE 1.39* 1.48*  CALCIUM 8.6* 8.2*  MG  --  1.7    Recent Labs  02/03/17 0348  AST 40  ALT 16*  ALKPHOS 55  BILITOT 2.2*  PROT 5.8*  ALBUMIN 3.5    Recent Labs  02/03/17 0348 02/04/17 0447  WBC 13.0* 10.3  HGB 10.1* 8.9*  HCT 30.6* 26.7*  MCV 93.6 94.3  PLT 82* 73*      Assessment/Plan: S/P GI bleed in the setting of Eliquis. Suspect black stool is residual blood. Hgb 8.9 (10.1, 9.5). Tolerating diet. Not planning on repeating EGD done last week. Echo by primary team. Will sign off. Call if questions.   [place] C. 02/04/2017, 3:10 PM  Pager [phone]  AFTER 5 PM or on weekends please call [phone] ID: [name], male   DOB: 01-Apr-1941, 76 y.o.   MRN: [id number]

## 2017-02-04 NOTE — Clinical Social Work Note (Signed)
Clinical Social Work Assessment  Patient Details  Name: [name] MRN: [id number] Date of Birth: [birth year]  Date of referral:  02/04/17               Reason for consult:  Facility Placement                Permission sought to share information with:  Family Supports Permission granted to share information::  Yes, Verbal Permission Granted  Name::        Agency::     Relationship::  son, at bedside  Contact Information:     Housing/Transportation Living arrangements for the past 2 months:  Single Family Home Source of Information:  Adult Children, Patient Patient Interpreter Needed:  None Criminal Activity/Legal Involvement Pertinent to Current Situation/Hospitalization:  No - Comment as needed Significant Relationships:  Adult Children, Other Family Members Lives with:  Self Do you feel safe going back to the place where you live?  Yes Need for family participation in patient care:  Yes (Comment)  Care giving concerns: Patient from home alone. PT recommending SNF.   Social Worker assessment / plan: CSW met with patient and son at bedside. Patient alert and oriented with pleasant affect. CSW discussed SNF recommendation. Patient hopeful for improvement during admission and would prefer to return home. Patient and son report that son's adult nephew will be able to provide 24 hour assistance to patient at discharge. Patient and son not agreeable to send out SNF referrals today, but agreeable to follow up tomorrow. CSW to follow and support with disposition planning.  Employment status:  Retired Forensic scientist:  Medicare PT Recommendations:  [place] / Referral to community resources:  [place]  Patient/Family's Response to care:  Patient and son appreciative of care.  Patient/Family's Understanding of and Emotional Response to Diagnosis, Current Treatment, and Prognosis: Patient and son with good understanding of medical  condition. Patient discussed previous health problems. Patient and son hopeful for return home over SNF, though not fully declining SNF at this time.  Emotional Assessment Appearance:  Appears stated age Attitude/Demeanor/Rapport:  Other (appropriate) Affect (typically observed):  Calm, Pleasant Orientation:  Oriented to Self, Oriented to Place, Oriented to  Time, Oriented to Situation Alcohol / Substance use:  Not Applicable Psych involvement (Current and /or in the community):  No (Comment)  Discharge Needs  Concerns to be addressed:  Discharge Planning Concerns Readmission within the last 30 days:  No Current discharge risk:  Physical Impairment Barriers to Discharge:  Continued Medical Work up   Estanislado Emms, LCSW 02/04/2017, 2:38 PM

## 2017-02-04 NOTE — Progress Notes (Signed)
PROGRESS NOTE    [name]  RCV:[id number] DOB: 08/05/1940 DOA: 02/01/2017 PCP: [name], MD   Brief Narrative:  Patient is a 76 y.o. Caucasian Male with PMHx significant for anemia, aortic stenosis, chronic lower back pain, MI, CAD, Endocarditis, Heart murmur, HTN, Atrial Fibrillation S/P Ablation S/P Pacemaker HLD. Patient had both EGD and colonoscopy on 10/26, and was told he could resume his eliquis that night.  He had 1 episode of dark BM on 10/27, but on 10/28 he had 4 dark stools and began to feel very SOB w/ exertion. On presentation to the ER, patient was found to have heme positive stools.    Hemoglobin today is 8.9g/dl, and has ranged from 8.9 to 10.1g/dl. Low BP persists, but patient tells me that he normally runs low BP's, with his usual systolic in the mid 10'C.   Subjective: Seen alongside patient's son. Currently undergoing ECHO No new complaints No  SOB or chest pain SBP of 31mmHg. Will continue to monitor, and also await ECHO report. Patient has history of aortic stenosis.  Assessment & Plan:   Active Problems:   HTN (hypertension)   Atrial fibrillation (HCC)   GI bleed  Chronic Diastolic CHF/Valvular heart disease: - Follow ECHO report. -Strict in and out -Daily weight - Optimize fluid status -Echocardiogram in progress -CXR Result review with patient. Improving.  Chronic Atrial fibrillation -S/P ablation with pacer placement -Currently in NSR - Anticoagulation is on hold  Hypotension -As per patient, his SBP is chronically low. -Asymptomatic  Pulmonary edema CAD  -s/p stenting 2012  GI bleed/ Acute blood loss anemia  -GI bleed S/P recent EGD/colonoscopy with biopsies while on Eliquis - GI to advise when to resume.      Recent Labs Lab 02/01/17 1500 02/02/17 0018 02/02/17 1834 02/03/17 0348 02/04/17 0447  HGB 9.0* 9.1* 9.5* 10.1* 8.9*    CKD(baseline Cr ~1.5) - Stable.  DVT prophylaxis: SCD Code Status: Full Family  Communication: None available Disposition Plan: TBD   Consultants:  [facility]  Procedures/Significant Events:  None  I have personally reviewed and interpreted all radiology studies and my findings are as above.  VENTILATOR SETTINGS: None   Cultures None  Antimicrobials: None   Devices    LINES / TUBES:   Continuous Infusions: . magnesium sulfate 1 - 4 g bolus IVPB 2 g (02/04/17 1325)  . pantoprozole (PROTONIX) infusion 8 mg/hr (02/04/17 0802)     Objective: Vitals:   02/04/17 0032 02/04/17 0641 02/04/17 0745 02/04/17 1248  BP: (!) 121/57 (!) 85/53 (!) 81/58 (!) 94/57  Pulse: 78 70    Resp: (!) 24 (!) 24    Temp: 98.1 F (36.7 C) 98 F (36.7 C) 98.6 F (37 C) 99.8 F (37.7 C)  TempSrc: Axillary Oral Oral Oral  SpO2: 93% 99% 97% 98%  Weight:  77.7 kg (171 lb 3.2 oz)    Height:        Intake/Output Summary (Last 24 hours) at 02/04/17 1345 Last data filed at 02/04/17 0400  Gross per 24 hour  Intake           524.58 ml  Output             1550 ml  Net         -1025.42 ml   Filed Weights   02/02/17 0405 02/03/17 0509 02/04/17 0641  Weight: 77.8 kg (171 lb 9.6 oz) 77.4 kg (170 lb 11.2 oz) 77.7 kg (171 lb 3.2 oz)  Examination:  General: Not in distress. AAO X 3. Neck:  Supple. No JVD. Lungs: Clear to auscultation. Cardiovascular: S1S2, ESM. Abdomen: negative abdominal pain, nondistended, positive soft, bowel sounds, no rebound, no ascites, no appreciable mass Extremities: No significant cyanosis, clubbing, or edema bilateral lower extremities Central nervous system:  Cranial nerves II through XII intact, tongue/uvula midline, all extremities muscle strength 5/5, negative dysarthria, negative expressive aphasia, negative receptive aphasia.  . Data Reviewed: Care during the described time interval was provided by me .  I have reviewed this patient's available data, including medical history, events of note, physical examination, and all test results as  part of my evaluation.   CBC:  Recent Labs Lab 02/01/17 1500 02/02/17 0018 02/02/17 1834 02/03/17 0348 02/04/17 0447  WBC 4.1 4.4 8.4 13.0* 10.3  NEUTROABS 3.1  --   --   --   --   HGB 9.0* 9.1* 9.5* 10.1* 8.9*  HCT 27.3* 27.0* 28.8* 30.6* 26.7*  MCV 93.5 92.5 93.8 93.6 94.3  PLT 87* 72* 83* 82* 73*   Basic Metabolic Panel:  Recent Labs Lab 02/01/17 1500 02/02/17 0018 02/03/17 0348 02/04/17 0447  NA 138 138 139 140  K 4.2 3.9 3.7 3.2*  CL 109 108 110 107  CO2 22 23 18* 25  GLUCOSE 120* 110* 142* 125*  BUN 40* 36* 29* 28*  CREATININE 1.31* 1.29* 1.39* 1.48*  CALCIUM 8.4* 8.3* 8.6* 8.2*  MG  --   --   --  1.7   GFR: Estimated Creatinine Clearance: 48.1 mL/min (A) (by C-G formula based on SCr of 1.48 mg/dL (H)). Liver Function Tests:  Recent Labs Lab 02/01/17 1500 02/03/17 0348  AST 34 40  ALT 16* 16*  ALKPHOS 48 55  BILITOT 1.0 2.2*  PROT 5.7* 5.8*  ALBUMIN 3.5 3.5   No results for input(s): LIPASE, AMYLASE in the last 168 hours. No results for input(s): AMMONIA in the last 168 hours. Coagulation Profile: No results for input(s): INR, PROTIME in the last 168 hours. Cardiac Enzymes: No results for input(s): CKTOTAL, CKMB, CKMBINDEX, TROPONINI in the last 168 hours. BNP (last 3 results) No results for input(s): PROBNP in the last 8760 hours. HbA1C: No results for input(s): HGBA1C in the last 72 hours. CBG: No results for input(s): GLUCAP in the last 168 hours. Lipid Profile: No results for input(s): CHOL, HDL, LDLCALC, TRIG, CHOLHDL, LDLDIRECT in the last 72 hours. Thyroid Function Tests: No results for input(s): TSH, T4TOTAL, FREET4, T3FREE, THYROIDAB in the last 72 hours. Anemia Panel: No results for input(s): VITAMINB12, FOLATE, FERRITIN, TIBC, IRON, RETICCTPCT in the last 72 hours. Urine analysis:    Component Value Date/Time   COLORURINE YELLOW 02/01/2017 [place] 02/01/2017 1653   LABSPEC 1.025 02/01/2017 1653   PHURINE 5.0  02/01/2017 1653   GLUCOSEU NEGATIVE 02/01/2017 1653   HGBUR NEGATIVE 02/01/2017 1653   BILIRUBINUR NEGATIVE 02/01/2017 1653   KETONESUR 20 (A) 02/01/2017 1653   PROTEINUR NEGATIVE 02/01/2017 1653   UROBILINOGEN 0.2 01/13/2015 2015   NITRITE NEGATIVE 02/01/2017 1653   LEUKOCYTESUR NEGATIVE 02/01/2017 1653   Sepsis Labs: @LABRCNTIP (procalcitonin:4,lacticidven:4)  ) Recent Results (from the past 240 hour(s))  MRSA PCR Screening     Status: None   Collection Time: 02/02/17 12:35 AM  Result Value Ref Range Status   MRSA by PCR NEGATIVE NEGATIVE Final    Comment:        The GeneXpert MRSA Assay (FDA approved for NASAL specimens only), is one component  of a comprehensive MRSA colonization surveillance program. It is not intended to diagnose MRSA infection nor to guide or monitor treatment for MRSA infections.          Radiology Studies: Dg Chest Port 1 View  Result Date: 02/04/2017 CLINICAL DATA:  Shortness of breath. EXAM: PORTABLE CHEST 1 VIEW COMPARISON:  Chest x-ray from yesterday. FINDINGS: Stable cardiomegaly. Unchanged left chest wall pacer device. Improved interstitial and alveolar opacities in the right lower lung. More fine, granular opacities in the right upper lobe are similar to prior study. Decreased right pleural effusion. Unchanged small left pleural effusion with adjacent basilar atelectasis. No pneumothorax. No acute osseous abnormality. IMPRESSION: 1. Improving pulmonary edema in the right lung with decreased right pleural effusion. More fine, granular opacities in the right upper lobe could reflect superimposed pneumonia. 2. Unchanged small left pleural effusion and left basilar atelectasis. Electronically Signed   By: Titus Dubin M.D.   On: 02/04/2017 08:32   Dg Chest Port 1 View  Result Date: 02/03/2017 CLINICAL DATA:  Shortness of breath tonight. EXAM: PORTABLE CHEST 1 VIEW COMPARISON:  Radiograph 10/22/2016 FINDINGS: Left-sided pacemaker in place.  Stable cardiomegaly. Development of diffuse interstitial and alveolar opacities throughout the right lung. Bilateral pleural effusions with fluid in the right minor fissure. No pneumothorax. IMPRESSION: Diffuse interstitial and alveolar opacities throughout the right lung, which in conjunction with cardiomegaly and pleural effusions are suspicious for pulmonary edema and CHF. Alternatively, atypical pneumonia is considered but felt less likely. Electronically Signed   By: Jeb Levering M.D.   On: 02/03/2017 01:49        Scheduled Meds: . ipratropium  2 spray Each Nare QID  . [START ON 02/05/2017] pantoprazole  40 mg Intravenous Q12H   Continuous Infusions: . magnesium sulfate 1 - 4 g bolus IVPB 2 g (02/04/17 1325)  . pantoprozole (PROTONIX) infusion 8 mg/hr (02/04/17 0802)     LOS: 3 days    Time spent: 40 minutes    Bonnell Public, MD [facility] Pager [phone].   If 7PM-7AM, please contact night-coverage [email or web address] Password [email or web address] 02/04/2017, 1:45 PM

## 2017-02-04 NOTE — Progress Notes (Signed)
  Echocardiogram 2D Echocardiogram has been performed.  Barbra Miner G Jakori Burkett 02/04/2017, 12:46 PM

## 2017-02-04 NOTE — Progress Notes (Signed)
Pharmacy Antibiotic Note  [name] is a 76 y.o. male admitted on 02/01/2017 with fever and hypotension.  Pharmacy has been consulted for Zosyn and vancomycin dosing. WBC wnl. Tm 100.8 F. CrCl ~ 45 mL/min   Plan: -Cefepime 2 gm IV Q 12 hours. Adjust if renal fx improves  -Vancomycin 1500 mg IV once, then vanc 750 mg IV Q 12 hours -Monitor CBC, renal fx, cultures and clinical progress -VT at [facility]   Height: 6' (182.9 cm) Weight: 171 lb 3.2 oz (77.7 kg) IBW/kg (Calculated) : 77.6  Temp (24hrs), Avg:98.9 F (37.2 C), Min:98 F (36.7 C), Max:100.8 F (38.2 C)   Recent Labs Lab 02/01/17 1500 02/02/17 0018 02/02/17 1834 02/03/17 0348 02/04/17 0447  WBC 4.1 4.4 8.4 13.0* 10.3  CREATININE 1.31* 1.29*  --  1.39* 1.48*    Estimated Creatinine Clearance: 48.1 mL/min (A) (by C-G formula based on SCr of 1.48 mg/dL (H)).    Allergies  Allergen Reactions  . Adhesive [Tape] Itching and Rash    (The patches affected his back, when he had his ablation)  . Brilinta [Ticagrelor] Itching and Rash  . Penicillins Rash    Has patient had a PCN reaction causing immediate rash, facial/tongue/throat swelling, SOB or lightheadedness with hypotension: Yes Has patient had a PCN reaction causing severe rash involving mucus membranes or skin necrosis: Yes Has patient had a PCN reaction that required hospitalization No Has patient had a PCN reaction occurring within the last 10 years: No If all of the above answers are "NO", then may proceed with Cephalosporin use.     Antimicrobials this admission: Cefepime 11/1 >>  Vanc 11/1 >>   Dose adjustments this admission: None   Microbiology results:   Thank you for allowing pharmacy to be a part of this patient's care.  Albertina Parr, PharmD., BCPS Clinical Pharmacist Pager [phone]

## 2017-02-05 ENCOUNTER — Inpatient Hospital Stay (HOSPITAL_COMMUNITY): Payer: Medicare Other

## 2017-02-05 DIAGNOSIS — R0602 Shortness of breath: Secondary | ICD-10-CM

## 2017-02-05 LAB — URINALYSIS, ROUTINE W REFLEX MICROSCOPIC
BILIRUBIN URINE: NEGATIVE
Glucose, UA: NEGATIVE mg/dL
Hgb urine dipstick: NEGATIVE
KETONES UR: NEGATIVE mg/dL
Leukocytes, UA: NEGATIVE
Nitrite: NEGATIVE
PH: 5 (ref 5.0–8.0)
PROTEIN: 30 mg/dL — AB
Specific Gravity, Urine: 1.029 (ref 1.005–1.030)

## 2017-02-05 LAB — CBC
HCT: 26.6 % — ABNORMAL LOW (ref 39.0–52.0)
Hemoglobin: 8.8 g/dL — ABNORMAL LOW (ref 13.0–17.0)
MCH: 31.1 pg (ref 26.0–34.0)
MCHC: 33.1 g/dL (ref 30.0–36.0)
MCV: 94 fL (ref 78.0–100.0)
Platelets: 73 10*3/uL — ABNORMAL LOW (ref 150–400)
RBC: 2.83 MIL/uL — ABNORMAL LOW (ref 4.22–5.81)
RDW: 16.4 % — ABNORMAL HIGH (ref 11.5–15.5)
WBC: 10.3 10*3/uL (ref 4.0–10.5)

## 2017-02-05 LAB — MAGNESIUM: MAGNESIUM: 2.1 mg/dL (ref 1.7–2.4)

## 2017-02-05 MED ORDER — PANTOPRAZOLE SODIUM 40 MG PO TBEC
40.0000 mg | DELAYED_RELEASE_TABLET | Freq: Two times a day (BID) | ORAL | Status: DC
  Administered 2017-02-05 – 2017-02-26 (×42): 40 mg via ORAL
  Filled 2017-02-05 (×42): qty 1

## 2017-02-05 MED ORDER — FUROSEMIDE 10 MG/ML IJ SOLN
20.0000 mg | Freq: Once | INTRAMUSCULAR | Status: AC
  Administered 2017-02-05: 20 mg via INTRAVENOUS
  Filled 2017-02-05: qty 2

## 2017-02-05 MED ORDER — LORAZEPAM 2 MG/ML IJ SOLN: 0.2500 mg | Freq: Once | INTRAMUSCULAR | Status: DC

## 2017-02-05 NOTE — Care Management Note (Signed)
Case Management Note  Patient Details  Name: [name] MRN: [id number] Date of Birth: [birth year]  Subjective/Objective:       Pt presented for GI Bleed and hypotension.  Pt from home alone and wishes to return home.   Pt unable to be completely assessed by PT due to hypotension.  PT recommending SNF at this time.  Pt states his wife used [facility] and if necessary, he would like to use also.  Pt has a walker and a cane at home that were his wife's and he can use if needed.  Also, his adult grandson can stay with him if needed.         Action/Plan: Will continue to follow.  Expected Discharge Date:                  Expected Discharge Plan:  [place]  In-House Referral:  NA  Discharge planning Services  NA  Post Acute Care Choice:  NA Choice offered to:  Patient  DME Arranged:  N/A DME Agency:  NA  HH Arranged:  NA HH Agency:  [facility]  Status of Service:  In process, will continue to follow  If discussed at Long Length of Stay Meetings, dates discussed:    Additional Comments:  Arley Phenix, RN 02/05/2017, 3:00 PM

## 2017-02-05 NOTE — Consult Note (Addendum)
Reason for Consult:Shortness of breath Referring Physician: Shortness of breath  [name] is an 76 y.o. male.  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 He recently underwent colonoscopy, following which he has had blood loss anemia while on eliquis. He started having episodes of lightheadedness over last weekend which persisted and let to his arrival to emergency department. While in the hospital, he is being transfused 1 unit of red blood cells given his anemia. Of note, there was no active bleeding or bleeding source found on his GI workup. He now has episodes of shortness of breath. Leg swelling has improved. He is a paced rhythm for the most part. His diltiazem was discontinued given that he now has AV node ablation. He is been off metoprolol succinate as well given his borderline blood pressure.   TEE 10/20/2016: Impressions:  - There is severe LVH most prominent in the basal septum measuring   24 mm. There is dynamic LVOT obstruction and suggestion of a   possible subaortic membrane with peak/mean gradients 52/25 mmHg.   A TTE is recommended for further evaluation of gradients and a CT   for evaluation of a possible subaortic membrane.     Aortic valve has fixed calcified right coronary cusp - unchanged   from the prior study in 2013. There is at least moderate aortic   regurgitation, a TTE would evaluate further.     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     No thrombus is seen in the left atrium or appendage.  TTE 02/04/2017: Study Conclusions  - Left ventricle: The ventricle is hyperdynamic with what appears   to be obstruction in the outflow tract also. The cavity size was   normal. Wall thickness was increased in a pattern of moderate to   severe LVH. Systolic function  was vigorous. The estimated   ejection fraction was in the range of 65% to 70%. Wall motion was   normal; there were no regional wall motion abnormalities. - Aortic valve: There is an extremely high gradient across the   aortic valve. It is difficult to tell if this is also from   contributiion of subaortic stenosis due to outflow tract   obstruction or contamination of the jet from a mitral   regurgitaiton jet and poor angles. Cusp separation was reduced.   Transvalvular velocity was increased. There was severe stenosis.   There was moderate regurgitation. - Mitral valve: Moderately calcified annulus. Mildly thickened   leaflets . There was moderate to severe regurgitation. - Left atrium: The atrium was severely dilated. - Right atrium: The atrium was moderately dilated. - Tricuspid valve: There was moderate regurgitation. - Pulmonic valve: There was moderate regurgitation. - Pulmonary arteries: Systolic pressure was severely increased. PA   peak pressure: 72 mm Hg (S).  Stress test 01/25/2015: Lexiscan myoview stress test 01/25/2015: 1. The resting electrocardiogram demonstrated accelerated junctional rhythm @ 112bpm and LBBB and secondary ST-T changes.  Stress EKG is non-diagnostic for ischemia as it a pharmacologic stress using Lexiscan. Stress symptoms included dyspnea, dizziness.The stress test was terminated because of the end of the pharmacological testing. 2. The overall quality of the study is good.  Left ventricular cavity is noted to be enlarged (166m) on the rest and stress studies.  SPECT images demonstrate homogeneous tracer distribution throughout the myocardium.  The  left ventricular ejection fraction was calculated to be 21% with global hypokinesis. There is no reversible or fixed defect to suggest ischemia or scar.  This represents a low risk study. Clinical correlation reommended.  Past Medical History:  Diagnosis Date  . Anemia   . Arthritis    "little in my fingers"  (10/21/2016)  . Chronic lower back pain    "last 3 months; usually when I bend" (12/24/2011)  . Coronary artery disease   . Endocarditis   . GERD (gastroesophageal reflux disease)   . Heart murmur   . High cholesterol   . Hypertension   . Migraines    "none in years" (10/21/2016)  . Myocardial infarction [facility])    POSSIBLE 2012 ELEVATED ENZYMES  . Pneumonia ~ 2011  . Presence of permanent cardiac pacemaker     Past Surgical History:  Procedure Laterality Date  . A-FLUTTER ABLATION N/A 08/12/2016   Procedure: A-Flutter Ablation;  Surgeon: Evans Lance, MD;  Location: [place] CV LAB;  Service: Cardiovascular;  Laterality: N/A;  . APPENDECTOMY    . AV NODE ABLATION  10/21/2016  . AV NODE ABLATION N/A 10/21/2016   Procedure: AV Node Ablation;  Surgeon: Evans Lance, MD;  Location: [place] CV LAB;  Service: Cardiovascular;  Laterality: N/A;  . BI-VENTRICULAR PACEMAKER INSERTION (CRT-P)  10/21/2016   BiV Pacemaker Insertion CRT-P  . BIV PACEMAKER INSERTION CRT-P N/A 10/21/2016   Procedure: BiV Pacemaker Insertion CRT-P;  Surgeon: Evans Lance, MD;  Location: [place] CV LAB;  Service: Cardiovascular;  Laterality: N/A;  . CARDIOVERSION N/A 10/29/2015   Procedure: CARDIOVERSION;  Surgeon: Adrian Prows, MD;  Location: [facility] ENDOSCOPY;  Service: Cardiovascular;  Laterality: N/A;  . COLON RESECTION  12/25/2011   Procedure: COLON RESECTION LAPAROSCOPIC;  Surgeon: Shann Medal, MD;  Location: [place];  Service: General;  Laterality: Right;  laparoscopic assisted right colon resection  . COLON SURGERY    . COLONOSCOPY  11/11/2011   Procedure: COLONOSCOPY;  Surgeon: Missy Sabins, MD;  Location: [place];  Service: Endoscopy;  Laterality: N/A;  . CORONARY ANGIOPLASTY WITH STENT PLACEMENT  11/11/2010   "1"  . CYSTECTOMY Right ~ 1980    eyelid  . INGUINAL HERNIA REPAIR Bilateral   . TEE WITHOUT CARDIOVERSION  11/06/2011   Procedure: TRANSESOPHAGEAL ECHOCARDIOGRAM (TEE);  Surgeon: Laverda Page, MD;  Location: [place];  Service: Cardiovascular;  Laterality: N/A;  . TEE WITHOUT CARDIOVERSION N/A 10/20/2016   Procedure: TRANSESOPHAGEAL ECHOCARDIOGRAM (TEE);  Surgeon: Dorothy Spark, MD;  Location[facility] ENDOSCOPY;  Service: Cardiovascular;  Laterality: N/A;    Family History  Problem Relation Age of Onset  . Hypertension Mother   . Heart failure Father   . Cancer Neg Hx   . Diabetes Neg Hx   . Hyperlipidemia Neg Hx   . Sudden death Neg Hx   . Stroke Neg Hx   . Heart attack Neg Hx     Social History:  reports that he has never smoked. He has never used smokeless tobacco. He reports that he drinks alcohol. He reports that he does not use drugs.  Allergies:  Allergies  Allergen Reactions  . Adhesive [Tape] Itching and Rash    (The patches affected his back, when he had his ablation)  . Brilinta [Ticagrelor] Itching and Rash  . Penicillins Rash    Has patient had a PCN reaction causing immediate rash, facial/tongue/throat swelling, SOB or lightheadedness with hypotension: Yes Has patient had a  PCN reaction causing severe rash involving mucus membranes or skin necrosis: Yes Has patient had a PCN reaction that required hospitalization No Has patient had a PCN reaction occurring within the last 10 years: No If all of the above answers are "NO", then may proceed with Cephalosporin use.     Medications: I have reviewed the patient's current medications.  Results for orders placed or performed during the hospital encounter of 02/01/17 (from the past 48 hour(s))  Basic metabolic panel     Status: Abnormal   Collection Time: 02/04/17  4:47 AM  Result Value Ref Range   Sodium 140 135 - 145 mmol/L   Potassium 3.2 (L) 3.5 - 5.1 mmol/L   Chloride 107 101 - 111 mmol/L   CO2 25 22 - 32 mmol/L   Glucose, Bld 125 (H) 65 - 99 mg/dL   BUN 28 (H) 6 - 20 mg/dL   Creatinine, Ser 1.48 (H) 0.61 - 1.24 mg/dL   Calcium 8.2 (L) 8.9 - 10.3 mg/dL   GFR calc non Af Amer 45 (L) >60  mL/min   GFR calc Af Amer 52 (L) >60 mL/min    Comment: (NOTE) The eGFR has been calculated using the CKD EPI equation. This calculation has not been validated in all clinical situations. eGFR's persistently <60 mL/min signify possible Chronic Kidney Disease.    Anion gap 8 5 - 15  Magnesium     Status: None   Collection Time: 02/04/17  4:47 AM  Result Value Ref Range   Magnesium 1.7 1.7 - 2.4 mg/dL  CBC     Status: Abnormal   Collection Time: 02/04/17  4:47 AM  Result Value Ref Range   WBC 10.3 4.0 - 10.5 K/uL   RBC 2.83 (L) 4.22 - 5.81 MIL/uL   Hemoglobin 8.9 (L) 13.0 - 17.0 g/dL   HCT 26.7 (L) 39.0 - 52.0 %   MCV 94.3 78.0 - 100.0 fL   MCH 31.4 26.0 - 34.0 pg   MCHC 33.3 30.0 - 36.0 g/dL   RDW 16.1 (H) 11.5 - 15.5 %   Platelets 73 (L) 150 - 400 K/uL    Comment: CONSISTENT WITH PREVIOUS RESULT  Magnesium     Status: None   Collection Time: 02/05/17  4:27 AM  Result Value Ref Range   Magnesium 2.1 1.7 - 2.4 mg/dL  CBC     Status: Abnormal   Collection Time: 02/05/17  4:27 AM  Result Value Ref Range   WBC 10.3 4.0 - 10.5 K/uL   RBC 2.83 (L) 4.22 - 5.81 MIL/uL   Hemoglobin 8.8 (L) 13.0 - 17.0 g/dL   HCT 26.6 (L) 39.0 - 52.0 %   MCV 94.0 78.0 - 100.0 fL   MCH 31.1 26.0 - 34.0 pg   MCHC 33.1 30.0 - 36.0 g/dL   RDW 16.4 (H) 11.5 - 15.5 %   Platelets 73 (L) 150 - 400 K/uL    Comment: CONSISTENT WITH PREVIOUS RESULT    Dg Chest Port 1 View  Result Date: 02/05/2017 CLINICAL DATA:  Dyspnea. EXAM: PORTABLE CHEST 1 VIEW COMPARISON:  02/04/2017, 02/03/2017 and 10/22/2016 FINDINGS: There is progressive pulmonary edema in the right upper lobe and in the left perihilar region. Slight increased density at the left lung base is probably a combination of pleural effusion and of pulmonary edema. Persistent small right pleural effusion. Cardiomegaly.  Pulmonary vascularity appears normal. Pacemaker in place. IMPRESSION: Progressive bilateral pulmonary edema. Small bilateral pleural  effusions slightly increased on the left.  Electronically Signed   By: Lorriane Shire M.D.   On: 02/05/2017 12:31   Dg Chest Port 1 View  Result Date: 02/04/2017 CLINICAL DATA:  Shortness of breath. EXAM: PORTABLE CHEST 1 VIEW COMPARISON:  Chest x-ray from yesterday. FINDINGS: Stable cardiomegaly. Unchanged left chest wall pacer device. Improved interstitial and alveolar opacities in the right lower lung. More fine, granular opacities in the right upper lobe are similar to prior study. Decreased right pleural effusion. Unchanged small left pleural effusion with adjacent basilar atelectasis. No pneumothorax. No acute osseous abnormality. IMPRESSION: 1. Improving pulmonary edema in the right lung with decreased right pleural effusion. More fine, granular opacities in the right upper lobe could reflect superimposed pneumonia. 2. Unchanged small left pleural effusion and left basilar atelectasis. Electronically Signed   By: Titus Dubin M.D.   On: 02/04/2017 08:32    Review of Systems  Constitutional: Negative.   HENT: Negative.   Respiratory: Positive for shortness of breath.   Cardiovascular: Positive for palpitations. Negative for chest pain and leg swelling.  Gastrointestinal: Positive for melena. Negative for abdominal pain.  Musculoskeletal: Negative.   Neurological:       Lightheadedness  All other systems reviewed and are negative.  Blood pressure (!) 83/64, pulse 70, temperature 99.8 F (37.7 C), temperature source Oral, resp. rate (!) 22, height 6' (1.829 m), weight 79.7 kg (175 lb 12.8 oz), SpO2 95 %. Physical Exam  Nursing note and vitals reviewed. Constitutional: He is oriented to person, place, and time. He appears well-developed and well-nourished.  HENT:  Head: Normocephalic and atraumatic.  Neck: No JVD present.  Cardiovascular: Normal rate and regular rhythm.   Murmur (Harsh III/VI murmur RUSB) heard. Respiratory: Effort normal. He has rales (Left basal).  GI: Soft.   Musculoskeletal: He exhibits no edema.  Neurological: He is alert and oriented to person, place, and time.    Assessment: 1. 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 Moderate valvular aortic stenosis 3. Blood loss anemia Lightheadedness: Combination of 1, 2, 3 Shortness of breath: Combination of 1, 2, 3 4. Paroxysmal atrial fib/flutter 5. BiV pacemaker placement s/p AV node ablation  Recommendations: Agree with holding Eliquis given bleeding risk. His CHA2DS2VASc score is 4, but bleeding risk outweighs the benefits at this time. I've discussed this with the patient and he understands that her risk of stroke. Blood transfusion as necessary for anemia. Currently stable. He needs suppression of his heart rate to reduce LVOT obstruction. Given that he is predominantly paced with complete AV block, we will reduce his pacing rate in hopes of reducing LVOT obstruction and subsequent symptoms. He is fairly euvolemic at this time. We will see him in clinic for outpatient follow-up and refer [facility] to [facility] for possible septal ablation.   Kru Allman J Zayden Maffei 02/05/2017, 12:53 PM   [place], MD [facility] [place] Pager: [phone] Office: [phone] If no answer Cell [phone]

## 2017-02-05 NOTE — Care Management Important Message (Signed)
Important Message  Patient Details  Name: [name] MRN: [id number] Date of Birth: 03-Oct-1940   Medicare Important Message Given:  Yes    Nathen May 02/05/2017, 10:18 AM

## 2017-02-05 NOTE — Progress Notes (Signed)
CSW met with patient at bedside and discussed rehab recommendations. Patient now agreeable to fax out referrals to SNFs. Patient's first choice is [facility], and declines to fax referral to [facility]. CSW faxed to [facility] and other area facilities. Patient still undecided if he will go home with PT or go to rehab, but agreeable to have referrals faxed out. Patient hopeful to be able to be mobile enough to go home at discharge. CSW to follow and support with disposition planning.  Estanislado Emms, [place]

## 2017-02-05 NOTE — Progress Notes (Signed)
BP 78/55 (63). Pt asymptomatic. Will continue to monitor patient closely

## 2017-02-05 NOTE — Plan of Care (Signed)
Problem: Activity: Goal: Risk for activity intolerance will decrease Outcome: Not Progressing Pt demonstrates dyspnea at rest. Verbalizes increased weakness. Positive orthostatics

## 2017-02-05 NOTE — Progress Notes (Signed)
PROGRESS NOTE    [name]  OXB:[id number] DOB: 1940-06-29 DOA: 02/01/2017 PCP: [name], MD   Brief Narrative: [name] is a 76 y.o. male with a history of anemia, aortic stenosis, chronic lower back pain, MI, CAD, Endocarditis, Heart murmur, HTN, Atrial Fibrillation S/P Ablation s/p pacemaker, HLD. Patient had both EGD and colonoscopy on 10/26, and was told he could resume his eliquis that night. He had 1 episode of dark BM on 10/27, but on 10/28 he had 4 dark stools and began to feel very SOB w/ exertion. On presentation to the ER, patient was found to have heme positive stools.   Hemoglobin today is 8.9g/dl, and has ranged from 8.9 to 10.1g/dl. Low BP persists, but patient tells me that he normally runs low BP's, with his usual systolic in the mid 68'T.   Assessment & Plan:   Active Problems:   HTN (hypertension)   Atrial fibrillation (HCC)   GI bleed   Acute on chronic diastolic heart failure Pulmonary edema Valvular heart disease Patient follows with Dr. Einar Gip. Echo significant for LVH, EF of 65-70%, and dilation of left/right atrium, tricuspid/pulmonic/mitral valve regurgitation, pulmonary artery hypertension and subaortic valve stenosis. -consult Dr. Einar Gip -lasix IV -daily weight/in out  Chronic atrial fibrillation S/p ablation and pacer placement. In sinus rhythm. -continue to hold Eliquis secondary to GI bleeding. Will restart once No evidence of GI bleeding  Hypotension Per patient this is chronic, however, it BP is significantly low. Made worse by Lasix and complicated by CHF  -watch blood pressure  CAD S/p stent in 2012  GI bleed Biopsies obtained one week ago from EGD/colonoscopy per [facility]. GI consulted without further recommendations. Last episode of melena yesterday. -watch for continued GI bleeding  Fever No leukocytosis. Low suspicion for infection but antibiotics started. Patient has a history of bloodstream infections in  addition to endocarditis. -blood cultures/urine culture pending -continue cefepime -discontinue vancomycin   DVT prophylaxis: SCDs Code Status: Full code Family Communication: None at bedside Disposition Plan: Discharge pending improvement in hypotension   Consultants:   Gastroenterology  Cardiology  Procedures:   Echocardiogram (02/04/17)    Antimicrobials:  Vancomycin  Cefepime    Subjective: Mild dyspnea. No chest pain.  Objective: Vitals:   02/05/17 0822 02/05/17 0856 02/05/17 0957 02/05/17 1135  BP: (!) 73/52 93/62 (!) 76/52 (!) 83/64  Pulse: 72   70  Resp: 16   (!) 22  Temp: 98.4 F (36.9 C)   99.8 F (37.7 C)  TempSrc: Oral   Oral  SpO2: 95% 97% 96% 95%  Weight:      Height:        Intake/Output Summary (Last 24 hours) at 02/05/17 1448 Last data filed at 02/05/17 1300  Gross per 24 hour  Intake             1420 ml  Output              750 ml  Net              670 ml   Filed Weights   02/03/17 0509 02/04/17 0641 02/05/17 0538  Weight: 77.4 kg (170 lb 11.2 oz) 77.7 kg (171 lb 3.2 oz) 79.7 kg (175 lb 12.8 oz)    Examination:  General exam: Appears calm and comfortable Respiratory system: Diminished breath sounds. Respiratory effort normal. Cardiovascular system: S1 & S2 heard, RRR. No murmurs, rubs, gallops or clicks. Gastrointestinal system: Abdomen is nondistended, soft and nontender. Normal  bowel sounds heard. Central nervous system: Alert and oriented. No focal neurological deficits. Extremities: No edema. No calf tenderness Skin: No cyanosis. No rashes Psychiatry: Judgement and insight appear normal. Mood & affect appropriate.     Data Reviewed: I have personally reviewed following labs and imaging studies  CBC:  Recent Labs Lab 02/01/17 1500 02/02/17 0018 02/02/17 1834 02/03/17 0348 02/04/17 0447 02/05/17 0427  WBC 4.1 4.4 8.4 13.0* 10.3 10.3  NEUTROABS 3.1  --   --   --   --   --   HGB 9.0* 9.1* 9.5* 10.1* 8.9* 8.8*    HCT 27.3* 27.0* 28.8* 30.6* 26.7* 26.6*  MCV 93.5 92.5 93.8 93.6 94.3 94.0  PLT 87* 72* 83* 82* 73* 73*   Basic Metabolic Panel:  Recent Labs Lab 02/01/17 1500 02/02/17 0018 02/03/17 0348 02/04/17 0447 02/05/17 0427  NA 138 138 139 140  --   K 4.2 3.9 3.7 3.2*  --   CL 109 108 110 107  --   CO2 22 23 18* 25  --   GLUCOSE 120* 110* 142* 125*  --   BUN 40* 36* 29* 28*  --   CREATININE 1.31* 1.29* 1.39* 1.48*  --   CALCIUM 8.4* 8.3* 8.6* 8.2*  --   MG  --   --   --  1.7 2.1   GFR: Estimated Creatinine Clearance: 48.1 mL/min (A) (by C-G formula based on SCr of 1.48 mg/dL (H)). Liver Function Tests:  Recent Labs Lab 02/01/17 1500 02/03/17 0348  AST 34 40  ALT 16* 16*  ALKPHOS 48 55  BILITOT 1.0 2.2*  PROT 5.7* 5.8*  ALBUMIN 3.5 3.5   No results for input(s): LIPASE, AMYLASE in the last 168 hours. No results for input(s): AMMONIA in the last 168 hours. Coagulation Profile: No results for input(s): INR, PROTIME in the last 168 hours. Cardiac Enzymes: No results for input(s): CKTOTAL, CKMB, CKMBINDEX, TROPONINI in the last 168 hours. BNP (last 3 results) No results for input(s): PROBNP in the last 8760 hours. HbA1C: No results for input(s): HGBA1C in the last 72 hours. CBG: No results for input(s): GLUCAP in the last 168 hours. Lipid Profile: No results for input(s): CHOL, HDL, LDLCALC, TRIG, CHOLHDL, LDLDIRECT in the last 72 hours. Thyroid Function Tests: No results for input(s): TSH, T4TOTAL, FREET4, T3FREE, THYROIDAB in the last 72 hours. Anemia Panel: No results for input(s): VITAMINB12, FOLATE, FERRITIN, TIBC, IRON, RETICCTPCT in the last 72 hours. Sepsis Labs: No results for input(s): PROCALCITON, LATICACIDVEN in the last 168 hours.  Recent Results (from the past 240 hour(s))  MRSA PCR Screening     Status: None   Collection Time: 02/02/17 12:35 AM  Result Value Ref Range Status   MRSA by PCR NEGATIVE NEGATIVE Final    Comment:        The GeneXpert  MRSA Assay (FDA approved for NASAL specimens only), is one component of a comprehensive MRSA colonization surveillance program. It is not intended to diagnose MRSA infection nor to guide or monitor treatment for MRSA infections.   Culture, blood (routine x 2)     Status: None (Preliminary result)   Collection Time: 02/04/17  6:37 PM  Result Value Ref Range Status   Specimen Description BLOOD LEFT ANTECUBITAL  Final   Special Requests   Final    BOTTLES DRAWN AEROBIC AND ANAEROBIC Blood Culture adequate volume   Culture NO GROWTH < 24 HOURS  Final   Report Status PENDING  Incomplete  Culture,  blood (routine x 2)     Status: None (Preliminary result)   Collection Time: 02/04/17  6:37 PM  Result Value Ref Range Status   Specimen Description BLOOD LEFT HAND  Final   Special Requests   Final    BOTTLES DRAWN AEROBIC AND ANAEROBIC Blood Culture adequate volume   Culture NO GROWTH < 24 HOURS  Final   Report Status PENDING  Incomplete         Radiology Studies: Dg Chest Port 1 View  Result Date: 02/05/2017 CLINICAL DATA:  Dyspnea. EXAM: PORTABLE CHEST 1 VIEW COMPARISON:  02/04/2017, 02/03/2017 and 10/22/2016 FINDINGS: There is progressive pulmonary edema in the right upper lobe and in the left perihilar region. Slight increased density at the left lung base is probably a combination of pleural effusion and of pulmonary edema. Persistent small right pleural effusion. Cardiomegaly.  Pulmonary vascularity appears normal. Pacemaker in place. IMPRESSION: Progressive bilateral pulmonary edema. Small bilateral pleural effusions slightly increased on the left. Electronically Signed   By: Lorriane Shire M.D.   On: 02/05/2017 12:31   Dg Chest Port 1 View  Result Date: 02/04/2017 CLINICAL DATA:  Shortness of breath. EXAM: PORTABLE CHEST 1 VIEW COMPARISON:  Chest x-ray from yesterday. FINDINGS: Stable cardiomegaly. Unchanged left chest wall pacer device. Improved interstitial and alveolar  opacities in the right lower lung. More fine, granular opacities in the right upper lobe are similar to prior study. Decreased right pleural effusion. Unchanged small left pleural effusion with adjacent basilar atelectasis. No pneumothorax. No acute osseous abnormality. IMPRESSION: 1. Improving pulmonary edema in the right lung with decreased right pleural effusion. More fine, granular opacities in the right upper lobe could reflect superimposed pneumonia. 2. Unchanged small left pleural effusion and left basilar atelectasis. Electronically Signed   By: Titus Dubin M.D.   On: 02/04/2017 08:32        Scheduled Meds: . furosemide  20 mg Intravenous Once  . ipratropium  2 spray Each Nare QID  . LORazepam  0.25 mg Intravenous Once  . pantoprazole  40 mg Oral BID   Continuous Infusions: . ceFEPime (MAXIPIME) IV Stopped (02/05/17 0617)     LOS: 4 days     Cordelia Poche, MD [facility] 02/05/2017, 2:48 PM Pager: [phone]  If 7PM-7AM, please contact night-coverage [email or web address] Password [facility] 02/05/2017, 2:48 PM

## 2017-02-06 DIAGNOSIS — I5032 Chronic diastolic (congestive) heart failure: Secondary | ICD-10-CM

## 2017-02-06 LAB — URINE CULTURE: Culture: NO GROWTH

## 2017-02-06 LAB — CBC
HCT: 27.4 % — ABNORMAL LOW (ref 39.0–52.0)
Hemoglobin: 9 g/dL — ABNORMAL LOW (ref 13.0–17.0)
MCH: 31 pg (ref 26.0–34.0)
MCHC: 32.8 g/dL (ref 30.0–36.0)
MCV: 94.5 fL (ref 78.0–100.0)
PLATELETS: 88 10*3/uL — AB (ref 150–400)
RBC: 2.9 MIL/uL — ABNORMAL LOW (ref 4.22–5.81)
RDW: 17 % — ABNORMAL HIGH (ref 11.5–15.5)
WBC: 8.9 10*3/uL (ref 4.0–10.5)

## 2017-02-06 LAB — BASIC METABOLIC PANEL
ANION GAP: 7 (ref 5–15)
BUN: 29 mg/dL — ABNORMAL HIGH (ref 6–20)
CO2: 23 mmol/L (ref 22–32)
Calcium: 7.8 mg/dL — ABNORMAL LOW (ref 8.9–10.3)
Chloride: 103 mmol/L (ref 101–111)
Creatinine, Ser: 1.37 mg/dL — ABNORMAL HIGH (ref 0.61–1.24)
GFR calc Af Amer: 57 mL/min — ABNORMAL LOW (ref >60)
GFR, EST NON AFRICAN AMERICAN: 49 mL/min — AB (ref >60)
GLUCOSE: 119 mg/dL — AB (ref 65–99)
POTASSIUM: 3.2 mmol/L — AB (ref 3.5–5.1)
SODIUM: 133 mmol/L — AB (ref 135–145)

## 2017-02-06 LAB — MAGNESIUM: Magnesium: 1.8 mg/dL (ref 1.7–2.4)

## 2017-02-06 LAB — LACTIC ACID, PLASMA: LACTIC ACID, VENOUS: 2.1 mmol/L — AB (ref 0.5–1.9)

## 2017-02-06 MED ORDER — FUROSEMIDE 10 MG/ML IJ SOLN
20.0000 mg | Freq: Once | INTRAMUSCULAR | Status: AC
  Administered 2017-02-06: 20 mg via INTRAVENOUS
  Filled 2017-02-06: qty 2

## 2017-02-06 MED ORDER — SODIUM CHLORIDE 0.9 % IV BOLUS (SEPSIS)
250.0000 mL | Freq: Once | INTRAVENOUS | Status: AC
  Administered 2017-02-06: 250 mL via INTRAVENOUS

## 2017-02-06 MED ORDER — POTASSIUM CHLORIDE CRYS ER 20 MEQ PO TBCR
40.0000 meq | EXTENDED_RELEASE_TABLET | Freq: Once | ORAL | Status: AC
Start: 2017-02-06 — End: 2017-02-06
  Administered 2017-02-06: 40 meq via ORAL
  Filled 2017-02-06: qty 2

## 2017-02-06 MED ORDER — APIXABAN 5 MG PO TABS: 5.0000 mg | ORAL_TABLET | Freq: Two times a day (BID) | ORAL | Status: DC

## 2017-02-06 MED ORDER — DILTIAZEM HCL ER COATED BEADS 120 MG PO TB24
120.0000 mg | ORAL_TABLET | Freq: Every day | ORAL | Status: DC
  Filled 2017-02-06 (×2): qty 1

## 2017-02-06 MED ORDER — POTASSIUM CHLORIDE CRYS ER 20 MEQ PO TBCR
40.0000 meq | EXTENDED_RELEASE_TABLET | Freq: Once | ORAL | Status: AC
  Administered 2017-02-06: 40 meq via ORAL
  Filled 2017-02-06: qty 2

## 2017-02-06 MED ORDER — SIMETHICONE 80 MG PO CHEW
80.0000 mg | CHEWABLE_TABLET | Freq: Once | ORAL | Status: AC
  Administered 2017-02-06: 80 mg via ORAL
  Filled 2017-02-06: qty 1

## 2017-02-06 MED ORDER — DILTIAZEM HCL 30 MG PO TABS
30.0000 mg | ORAL_TABLET | Freq: Once | ORAL | Status: DC
  Filled 2017-02-06: qty 1

## 2017-02-06 NOTE — Plan of Care (Signed)
Problem: Safety: Goal: Ability to remain free from injury will improve Outcome: Progressing Pt educated on use of call light system. Verbalizes understanding of need to call for assistance prior to ambulation when necessary

## 2017-02-06 NOTE — Progress Notes (Signed)
Results for [name], [name] (MRN [id number]) as of 02/06/2017 19:32  Ref. Range 02/06/2017 17:57  Lactic Acid, Venous Latest Ref Range: 0.5 - 1.9 mmol/L 2.1 [facility])   MD Patwardhan aware of above. Verbal orders given to administer 22ml NS IV bolus over 2-4 hours and repeat lactic acid. Day RN will start bolus and make oncoming RN aware.

## 2017-02-06 NOTE — Progress Notes (Addendum)
PROGRESS NOTE    [name]  WHQ:[id number] DOB: 09/22/1940 DOA: 02/01/2017 PCP: [name], MD   Brief Narrative: [name] is a 76 y.o. male with a history of anemia, aortic stenosis, chronic lower back pain, MI, CAD, Endocarditis, Heart murmur, HTN, Atrial Fibrillation S/P Ablation s/p pacemaker, HLD. Patient had both EGD and colonoscopy on 10/26, and was told he could resume his eliquis that night. He had 1 episode of dark BM on 10/27, but on 10/28 he had 4 dark stools and began to feel very SOB w/ exertion. On presentation to the ER, patient was found to have heme positive stools.   Hemoglobin today is 8.9g/dl, and has ranged from 8.9 to 10.1g/dl. Low BP persists, but patient tells me that he normally runs low BP's, with his usual systolic in the mid 65'L.   Assessment & Plan:   Active Problems:   HTN (hypertension)   Atrial fibrillation (HCC)   GI bleed   Acute on chronic diastolic heart failure Pulmonary edema Valvular heart disease Patient follows with Dr. Einar Gip. Echo significant for LVH, EF of 65-70%, and dilation of left/right atrium, tricuspid/pulmonic/mitral valve regurgitation, pulmonary artery hypertension and subaortic valve stenosis. -cardiology recommendations: continue to hold Eliquis. Reduced pacing rate. Outpatient follow-up -lasix IV x1, then resume home regimen in AM -daily weight/in out -reconsult PT for evaluation  Chronic atrial fibrillation S/p ablation and pacer placement. In sinus rhythm. -continue to hold Eliquis secondary to GI bleeding  Hypotension Per patient this is chronic, however, it BP is significantly low. Per discussion with cardiology, blood pressure averages in 80-90 SBP range. -watch blood pressure  CAD S/p stent in 2012  GI bleed Biopsies obtained one week ago from EGD/colonoscopy per [facility]. GI consulted without further recommendations. Last episode of melena three days ago. -watch for continued GI  bleeding  Fever No leukocytosis. Low suspicion for infection but antibiotics started. Patient has a history of bloodstream infections in addition to endocarditis. Blood cultures negative x24 hours.  -blood cultures/urine culture pending -discontinue cefepime -watch for fevers off of antibiotics   DVT prophylaxis: SCDs Code Status: Full code Family Communication: None at bedside Disposition Plan: Discharge to SNF   Consultants:   Gastroenterology  Cardiology  Procedures:   Echocardiogram (02/04/17)    Antimicrobials:  Vancomycin  Cefepime    Subjective: Mild dyspnea. No chest pain. Feels weak. Unable to ambulate very far without significant feeling of fatigue.  Objective: Vitals:   02/05/17 1600 02/05/17 1630 02/05/17 2028 02/06/17 0740  BP: 103/60  (!) 87/53 104/65  Pulse:   62 64  Resp:   20 17  Temp:  99.8 F (37.7 C) 98.7 F (37.1 C) 98.6 F (37 C)  TempSrc:  Oral Oral Oral  SpO2:  93% 90% 94%  Weight:      Height:        Intake/Output Summary (Last 24 hours) at 02/06/17 0845 Last data filed at 02/06/17 0742  Gross per 24 hour  Intake              960 ml  Output              720 ml  Net              240 ml   Filed Weights   02/03/17 0509 02/04/17 0641 02/05/17 0538  Weight: 77.4 kg (170 lb 11.2 oz) 77.7 kg (171 lb 3.2 oz) 79.7 kg (175 lb 12.8 oz)    Examination:  General exam: Appears calm and comfortable Respiratory system: Diminished breath sounds. Left lower lobe rales. Respiratory effort normal. Cardiovascular system: S1 & S2 heard, RRR. No murmurs, rubs, gallops or clicks. Gastrointestinal system: Abdomen is nondistended, soft and nontender. Normal bowel sounds heard. Central nervous system: Alert and oriented. No focal neurological deficits. Extremities: No edema. No calf tenderness Skin: No cyanosis. No rashes Psychiatry: Judgement and insight appear normal. Mood & affect appropriate.     Data Reviewed: I have personally reviewed  following labs and imaging studies  CBC:  Recent Labs Lab 02/01/17 1500 02/02/17 0018 02/02/17 1834 02/03/17 0348 02/04/17 0447 02/05/17 0427  WBC 4.1 4.4 8.4 13.0* 10.3 10.3  NEUTROABS 3.1  --   --   --   --   --   HGB 9.0* 9.1* 9.5* 10.1* 8.9* 8.8*  HCT 27.3* 27.0* 28.8* 30.6* 26.7* 26.6*  MCV 93.5 92.5 93.8 93.6 94.3 94.0  PLT 87* 72* 83* 82* 73* 73*   Basic Metabolic Panel:  Recent Labs Lab 02/01/17 1500 02/02/17 0018 02/03/17 0348 02/04/17 0447 02/05/17 0427 02/06/17 0545  NA 138 138 139 140  --   --   K 4.2 3.9 3.7 3.2*  --   --   CL 109 108 110 107  --   --   CO2 22 23 18* 25  --   --   GLUCOSE 120* 110* 142* 125*  --   --   BUN 40* 36* 29* 28*  --   --   CREATININE 1.31* 1.29* 1.39* 1.48*  --   --   CALCIUM 8.4* 8.3* 8.6* 8.2*  --   --   MG  --   --   --  1.7 2.1 1.8   GFR: Estimated Creatinine Clearance: 48.1 mL/min (A) (by C-G formula based on SCr of 1.48 mg/dL (H)). Liver Function Tests:  Recent Labs Lab 02/01/17 1500 02/03/17 0348  AST 34 40  ALT 16* 16*  ALKPHOS 48 55  BILITOT 1.0 2.2*  PROT 5.7* 5.8*  ALBUMIN 3.5 3.5   No results for input(s): LIPASE, AMYLASE in the last 168 hours. No results for input(s): AMMONIA in the last 168 hours. Coagulation Profile: No results for input(s): INR, PROTIME in the last 168 hours. Cardiac Enzymes: No results for input(s): CKTOTAL, CKMB, CKMBINDEX, TROPONINI in the last 168 hours. BNP (last 3 results) No results for input(s): PROBNP in the last 8760 hours. HbA1C: No results for input(s): HGBA1C in the last 72 hours. CBG: No results for input(s): GLUCAP in the last 168 hours. Lipid Profile: No results for input(s): CHOL, HDL, LDLCALC, TRIG, CHOLHDL, LDLDIRECT in the last 72 hours. Thyroid Function Tests: No results for input(s): TSH, T4TOTAL, FREET4, T3FREE, THYROIDAB in the last 72 hours. Anemia Panel: No results for input(s): VITAMINB12, FOLATE, FERRITIN, TIBC, IRON, RETICCTPCT in the last 72  hours. Sepsis Labs: No results for input(s): PROCALCITON, LATICACIDVEN in the last 168 hours.  Recent Results (from the past 240 hour(s))  MRSA PCR Screening     Status: None   Collection Time: 02/02/17 12:35 AM  Result Value Ref Range Status   MRSA by PCR NEGATIVE NEGATIVE Final    Comment:        The GeneXpert MRSA Assay (FDA approved for NASAL specimens only), is one component of a comprehensive MRSA colonization surveillance program. It is not intended to diagnose MRSA infection nor to guide or monitor treatment for MRSA infections.   Culture, blood (routine x 2)  Status: None (Preliminary result)   Collection Time: 02/04/17  6:37 PM  Result Value Ref Range Status   Specimen Description BLOOD LEFT ANTECUBITAL  Final   Special Requests   Final    BOTTLES DRAWN AEROBIC AND ANAEROBIC Blood Culture adequate volume   Culture NO GROWTH < 24 HOURS  Final   Report Status PENDING  Incomplete  Culture, blood (routine x 2)     Status: None (Preliminary result)   Collection Time: 02/04/17  6:37 PM  Result Value Ref Range Status   Specimen Description BLOOD LEFT HAND  Final   Special Requests   Final    BOTTLES DRAWN AEROBIC AND ANAEROBIC Blood Culture adequate volume   Culture NO GROWTH < 24 HOURS  Final   Report Status PENDING  Incomplete         Radiology Studies: Dg Chest Port 1 View  Result Date: 02/05/2017 CLINICAL DATA:  Dyspnea. EXAM: PORTABLE CHEST 1 VIEW COMPARISON:  02/04/2017, 02/03/2017 and 10/22/2016 FINDINGS: There is progressive pulmonary edema in the right upper lobe and in the left perihilar region. Slight increased density at the left lung base is probably a combination of pleural effusion and of pulmonary edema. Persistent small right pleural effusion. Cardiomegaly.  Pulmonary vascularity appears normal. Pacemaker in place. IMPRESSION: Progressive bilateral pulmonary edema. Small bilateral pleural effusions slightly increased on the left. Electronically  Signed   By: Lorriane Shire M.D.   On: 02/05/2017 12:31        Scheduled Meds: . furosemide  20 mg Intravenous Once  . ipratropium  2 spray Each Nare QID  . LORazepam  0.25 mg Intravenous Once  . pantoprazole  40 mg Oral BID   Continuous Infusions:    LOS: 5 days     Cordelia Poche, MD [facility] 02/06/2017, 8:45 AM Pager: [phone]  If 7PM-7AM, please contact night-coverage [email or web address] Password [email or web address] 02/06/2017, 8:45 AM

## 2017-02-06 NOTE — Progress Notes (Signed)
Dr Virgina Jock in to see patient. MD placed orders for lactic acid to be drawn in the am and CXRAY to be taken in the am as well. New orders for potassium placed. MD also noted that he is okay with the patient's systolic BP to be in the high 70s as long as the patient is not symptomatic, namely lightheaded.

## 2017-02-06 NOTE — Progress Notes (Signed)
Subjective:  Feels slightl better than yesterday  O2 sat low to mid 90s off Oxygen  Objective:  Vital Signs in the last 24 hours: Temp:  [98.6 F (37 C)-99.8 F (37.7 C)] 98.6 F (37 C) (11/03 0740) Pulse Rate:  [62-70] 64 (11/03 0740) Resp:  [17-22] 17 (11/03 0740) BP: (76-104)/(52-65) 104/65 (11/03 0740) SpO2:  [90 %-97 %] 94 % (11/03 0740)  Intake/Output from previous day: 11/02 0701 - 11/03 0700 In: 960 [P.O.:960] Out: 520 [Urine:520] Intake/Output from this shift: Total I/O In: -  Out: 200 [Urine:200]  Physical Exam: Nursing note and vitals reviewed. Constitutional: He is oriented to person, place, and time. He appears well-developed and well-nourished.  HENT:  Head: Normocephalic and atraumatic.  Neck: No JVD present.  Cardiovascular: Normal rate and regular rhythm.   Murmur (Harsh III/VI murmur RUSB) heard. Respiratory: Effort normal. He has rales (Left basal).  GI: Soft.  Musculoskeletal: He exhibits no edema.  Neurological: He is alert and oriented to person, place, and time.     Lab Results:  Recent Labs  02/04/17 0447 02/05/17 0427  WBC 10.3 10.3  HGB 8.9* 8.8*  PLT 73* 73*    Recent Labs  02/04/17 0447  NA 140  K 3.2*  CL 107  CO2 25  GLUCOSE 125*  BUN 28*  CREATININE 1.48*    Imaging:   Cardiac Studies: Study Conclusions  - Left ventricle: The ventricle is hyperdynamic with what appears   to be obstruction in the outflow tract also. The cavity size was   normal. Wall thickness was increased in a pattern of moderate to   severe LVH. Systolic function was vigorous. The estimated   ejection fraction was in the range of 65% to 70%. Wall motion was   normal; there were no regional wall motion abnormalities. - Aortic valve: There is an extremely high gradient across the   aortic valve. It is difficult to tell if this is also from   contributiion of subaortic stenosis due to outflow tract   obstruction or contamination of the jet  from a mitral   regurgitaiton jet and poor angles. Cusp separation was reduced.   Transvalvular velocity was increased. There was severe stenosis.   There was moderate regurgitation. - Mitral valve: Moderately calcified annulus. Mildly thickened   leaflets . There was moderate to severe regurgitation. - Left atrium: The atrium was severely dilated. - Right atrium: The atrium was moderately dilated. - Tricuspid valve: There was moderate regurgitation. - Pulmonic valve: There was moderate regurgitation. - Pulmonary arteries: Systolic pressure was severely increased. PA   peak pressure: 72 mm Hg (S).   Assessment: 1. 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 Moderate valvular aortic stenosis 3. Blood loss anemia Lightheadedness: Combination of 1, 2, 3 Shortness of breath: Combination of 1, 2, 3 4. Paroxysmal atrial fib/flutter 5. BiV pacemaker placement s/p AV node ablation  Recommendations: Pacemaker reprogramming 02/05/2017: Medtronic BiV pacemaker with RA, RV (His), and LV (CS) leads. Reduced lower rate from 70 bpm to 50 bpm Underlying junctional rate in 60s, post AV nodal ablation Battery life 5 yrs He needs suppression of his heart rate to reduce LVOT obstruction. Given that he is predominantly paced with complete AV block, we have reduced his pacing rate in hopes of reducing LVOT obstruction and subsequent symptoms. However, he does have junctional escape rhythm in 60s post AV node ablation. Recommend diltiazem 120 mg daily. Check blood pressure in both arms. If not symptomatic, SBP  in 80s-90s is acceptable. He does have left basal rales. Recommend one dose of 40 mg IV lasix today. We will see him in clinic for outpatient follow-up and refer him to [facility] for possible septal ablation. Agree with holding Eliquis given bleeding risk. His CHA2DS2VASc score is 4, but bleeding risk outweighs the benefits at this time. I've discussed this with the patient and he  understands that his risk of stroke. Blood transfusion as necessary for anemia. Currently stable.    LOS: 5 days    Kalmen Lollar J Kris Burd 02/06/2017, 8:50 AM  [place], MD [facility] [place] Pager: [phone] Office: [phone] If no answer Cell [phone]

## 2017-02-06 NOTE — Progress Notes (Addendum)
   02/06/17 1634  Vitals  BP (!) 79/53  MAP (mmHg) (!) 63  BP Location Right Arm  BP Method Manual  Patient Position (if appropriate) Sitting  ECG Heart Rate 62  Oxygen Therapy  SpO2 96 %  O2 Device Nasal Cannula  O2 Flow Rate (L/min) 2 L/min    MD Patwardhan notified of above as well as pt being orthostatic. MD also aware that PO cardizem was held. Orders given to check lactic acid at this time. Will obtain and continue to monitor closely.

## 2017-02-07 ENCOUNTER — Other Ambulatory Visit: Payer: Self-pay

## 2017-02-07 ENCOUNTER — Inpatient Hospital Stay (HOSPITAL_COMMUNITY): Payer: Medicare Other

## 2017-02-07 LAB — BASIC METABOLIC PANEL
Anion gap: 10 (ref 5–15)
BUN: 35 mg/dL — ABNORMAL HIGH (ref 6–20)
CALCIUM: 8.2 mg/dL — AB (ref 8.9–10.3)
CHLORIDE: 101 mmol/L (ref 101–111)
CO2: 23 mmol/L (ref 22–32)
Creatinine, Ser: 1.54 mg/dL — ABNORMAL HIGH (ref 0.61–1.24)
GFR calc non Af Amer: 43 mL/min — ABNORMAL LOW (ref >60)
GFR, EST AFRICAN AMERICAN: 50 mL/min — AB (ref >60)
GLUCOSE: 127 mg/dL — AB (ref 65–99)
Potassium: 3.8 mmol/L (ref 3.5–5.1)
SODIUM: 134 mmol/L — AB (ref 135–145)

## 2017-02-07 LAB — LACTIC ACID, PLASMA: Lactic Acid, Venous: 1.9 mmol/L (ref 0.5–1.9)

## 2017-02-07 LAB — CREATININE, SERUM
CREATININE: 1.63 mg/dL — AB (ref 0.61–1.24)
GFR, EST AFRICAN AMERICAN: 46 mL/min — AB (ref >60)
GFR, EST NON AFRICAN AMERICAN: 40 mL/min — AB (ref >60)

## 2017-02-07 LAB — MAGNESIUM: MAGNESIUM: 1.8 mg/dL (ref 1.7–2.4)

## 2017-02-07 MED ORDER — SIMETHICONE 80 MG PO CHEW
80.0000 mg | CHEWABLE_TABLET | Freq: Four times a day (QID) | ORAL | Status: DC | PRN
  Administered 2017-02-07 – 2017-02-25 (×11): 80 mg via ORAL
  Filled 2017-02-07 (×11): qty 1

## 2017-02-07 MED ORDER — SODIUM CHLORIDE 0.9% FLUSH: 3.0000 mL | INTRAVENOUS | Status: DC | PRN

## 2017-02-07 MED ORDER — SODIUM CHLORIDE 0.9% FLUSH
3.0000 mL | Freq: Two times a day (BID) | INTRAVENOUS | Status: DC
  Administered 2017-02-07 – 2017-02-09 (×3): 3 mL via INTRAVENOUS

## 2017-02-07 MED ORDER — DILTIAZEM HCL 30 MG PO TABS: 30.0000 mg | ORAL_TABLET | Freq: Two times a day (BID) | ORAL | Status: DC

## 2017-02-07 MED ORDER — SODIUM CHLORIDE 0.9 % IV SOLN: INTRAVENOUS | Status: DC

## 2017-02-07 MED ORDER — SODIUM CHLORIDE 0.9 % IV SOLN
INTRAVENOUS | Status: AC
  Administered 2017-02-08: via INTRAVENOUS

## 2017-02-07 MED ORDER — ASPIRIN 81 MG PO CHEW
81.0000 mg | CHEWABLE_TABLET | ORAL | Status: AC
  Administered 2017-02-08: 81 mg via ORAL
  Filled 2017-02-07: qty 1

## 2017-02-07 MED ORDER — METOPROLOL TARTRATE 12.5 MG HALF TABLET
12.5000 mg | ORAL_TABLET | Freq: Two times a day (BID) | ORAL | Status: DC
  Administered 2017-02-07 – 2017-02-09 (×4): 12.5 mg via ORAL
  Filled 2017-02-07 (×5): qty 1

## 2017-02-07 MED ORDER — SODIUM CHLORIDE 0.9 % IV SOLN: 250.0000 mL | INTRAVENOUS | Status: DC | PRN

## 2017-02-07 NOTE — Progress Notes (Addendum)
Subjective:  No lightheadedness Breathing marginally improved Continue to require supplemental oxygen  Mildly elevated lactic acid on 02/06/2017 after episodes of SBP in 70s, having received 40 mg IV lasix earlier in the day. Lactic acid down from 2.1 to 1.9 after gentle hydration.   Objective:  Vital Signs in the last 24 hours: Temp:  [97.6 F (36.4 C)-98.7 F (37.1 C)] 97.6 F (36.4 C) (11/04 0743) Pulse Rate:  [57-64] 57 (11/04 0743) Resp:  [18-26] 26 (11/04 0743) BP: (76-104)/(51-65) 96/58 (11/04 0743) SpO2:  [93 %-100 %] 94 % (11/04 0743) Weight:  [79.6 kg (175 lb 8 oz)] 79.6 kg (175 lb 8 oz) (11/04 0348)  Intake/Output from previous day: 11/03 0701 - 11/04 0700 In: 870 [P.O.:620; IV Piggyback:250] Out: 776 [Urine:775; Stool:1] Intake/Output from this shift: Total I/O In: 240 [P.O.:240] Out: -   Physical Exam: Nursing note and vitals reviewed. Constitutional: He is oriented to person, place, and time. He appears well-developed and well-nourished.  HENT:  Head: Normocephalic and atraumatic.  Neck: No JVD present.  Cardiovascular: Normal rate and regular rhythm.   Murmur (Harsh III/VI murmur RUSB) heard. Respiratory: Effort normal. He has rales (Left basal).  GI: Soft.  Musculoskeletal: He exhibits no edema.  Neurological: He is alert and oriented to person, place, and time.     Lab Results: Recent Labs    02/05/17 0427 02/06/17 0924  WBC 10.3 8.9  HGB 8.8* 9.0*  PLT 73* 88*   Recent Labs    02/06/17 1110 02/07/17 0509  NA 133* 134*  K 3.2* 3.8  CL 103 101  CO2 23 23  GLUCOSE 119* 127*  BUN 29* 35*  CREATININE 1.37* 1.54*    Imaging:   Cardiac Studies: Study Conclusions  - Left ventricle: The ventricle is hyperdynamic with what appears   to be obstruction in the outflow tract also. The cavity size was   normal. Wall thickness was increased in a pattern of moderate to   severe LVH. Systolic function was vigorous. The estimated   ejection  fraction was in the range of 65% to 70%. Wall motion was   normal; there were no regional wall motion abnormalities. - Aortic valve: There is an extremely high gradient across the   aortic valve. It is difficult to tell if this is also from   contributiion of subaortic stenosis due to outflow tract   obstruction or contamination of the jet from a mitral   regurgitaiton jet and poor angles. Cusp separation was reduced.   Transvalvular velocity was increased. There was severe stenosis.   There was moderate regurgitation. - Mitral valve: Moderately calcified annulus. Mildly thickened   leaflets . There was moderate to severe regurgitation. - Left atrium: The atrium was severely dilated. - Right atrium: The atrium was moderately dilated. - Tricuspid valve: There was moderate regurgitation. - Pulmonic valve: There was moderate regurgitation. - Pulmonary arteries: Systolic pressure was severely increased. PA   peak pressure: 72 mm Hg (S).  02/07/2017 EXAM: PORTABLE CHEST 1 VIEW  COMPARISON:  02/05/2017.  FINDINGS: Left-sided triple lead pacer device appears in stable position.  The cardiomediastinal silhouette is enlarged but stable. There is continued pulmonary vascular congestion and edema.  Small bilateral pleural effusions, left greater than right. No pneumothorax.  No acute osseous abnormalities.  IMPRESSION: Grossly stable appearance of the chest demonstrating cardiomegaly, pulmonary edema and bilateral pleural effusions. Superimposed infiltrate difficult to exclude in this setting.    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 He was admitted on 10/29 with lightheadedness post blood anemia after colonoscopy, and shortness of breath  requiring oxygen   1. 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 Moderate valvular aortic stenosis 3. Blood loss anemia Lightheadedness: Combination of 1, 2, 3 Shortness of breath: Combination of 1, 2, 3 4. Paroxysmal atrial fib/flutter 5. BiV pacemaker placement s/p AV node ablation 6. Mildly increased creatinine, likely pre renal azotemia  Recommendations: Pacemaker reprogramming 02/05/2017: Medtronic BiV pacemaker with RA, RV (His), and LV (CS) leads. Reduced lower rate from 70 bpm to 50 bpm Underlying junctional rate in 60s, post AV nodal ablation Battery life 5 yrs  He needs suppression of his heart rate to reduce LVOT obstruction. Given that he is predominantly paced with complete AV block, we have reduced his pacing rate in hopes of reducing LVOT obstruction and subsequent symptoms. However, he does have junctional escape rhythm in 60s post AV node ablation. Fusion complexes seen on telemetry due to intrinsic junctional rhythm being faster than the set lower rate of 50s. Will attempt to decrease intrinsic rhythm lower than 50 mg. Unable to use diltiazem due to SBP in 70s and 80s. BP slightly improved after 250 cc fluid overnight. Will try metoprolol 12.5 bid today.  He is currently not in shock, but his hemodynamics are extremely tenuous due to severe LVOT obstruction, concurrent pulmonary congestion. I have discussed right heart catheterization with possibility of leaving indwelling PA catheter with him, This will give [place] more objective hemodynamic data to help his medical management. Will also consider simultaneous left and right heart catheterization to directly measure LVOT gradient and aortic valvular gradient. I will consider IV phenylephrine, depending on his invasive hemodynamics, to decrease LVOT gradient and increase his MAP, and then use beta blocker or CCB to decrease his heart rate and further reduce LVOT obstruction. Eventually, he will need to be  evaluated for either myectomy or alcohol septal ablation. I will discuss this with his primary cardiologist Dr. Einar Gip and we will discuss with Dr. Jacelyn Grip at [facility].  He will likely need transfer to [facility] for invasive hemodynamic monitoring after right heart cath placement on 02/08/2017.  Agree with holding Eliquis given bleeding risk. His CHA2DS2VASc score is 4, but bleeding risk outweighs the benefits at this time. I've discussed this with the patient and he understands that his risk of stroke. Blood transfusion as necessary for anemia. Currently stable.    LOS: 6 days    Manish J Patwardhan 02/07/2017, 9:50 AM  [place], MD [facility] [place] Pager: [phone] Office: [phone] If no answer Cell [phone]

## 2017-02-07 NOTE — Progress Notes (Signed)
PT Cancellation Note  Patient Details Name: [name] MRN: [id number] DOB: December 01, 1940   Cancelled Treatment:    Reason Eval/Treat Not Completed: Medical issues which prohibited therapy.  PT re-consulted for pt currently on caseload.  Based on cardiology note, "hemodynamics extremely tenuous" and apparent plan for R heart cath next date, will plan to hold PT and await re-order post-procedure once pt is medically appropriate for mobility retraining.    [facility] 02/07/2017, 11:53 AM

## 2017-02-07 NOTE — Progress Notes (Signed)
MD paged and notified. Pacer spikes showing up in the middle of QRS and after QRS.

## 2017-02-07 NOTE — Progress Notes (Signed)
PROGRESS NOTE    [name]  KYH:[id number] DOB: 1940-11-04 DOA: 02/01/2017 PCP: [name], MD   Brief Narrative: [name] is a 76 y.o. male with a history of anemia, aortic stenosis, chronic lower back pain, MI, CAD, Endocarditis, Heart murmur, HTN, Atrial Fibrillation S/P Ablation s/p pacemaker, HLD. Patient had both EGD and colonoscopy on 10/26, and was told he could resume his eliquis that night. He had 1 episode of dark BM on 10/27, but on 10/28 he had 4 dark stools and began to feel very SOB w/ exertion. On presentation to the ER, patient was found to have heme positive stools.   Hemoglobin today is 8.9g/dl, and has ranged from 8.9 to 10.1g/dl. Low BP persists, but patient tells me that he normally runs low BP's, with his usual systolic in the mid 15'V.   Assessment & Plan:   Active Problems:   Iron deficiency anemia   Hypertrophic obstructive cardiomyopathy (HCC)   HTN (hypertension)   Subaortic stenosis   Hyperlipidemia   Hypotension   Atrial fibrillation (HCC)   GI bleed   Acute on chronic diastolic CHF (congestive heart failure) (HCC)   Acute on chronic diastolic heart failure Pulmonary edema Valvular heart disease Patient follows with Dr. Einar Gip. Echo significant for LVH, EF of 65-70%, and dilation of left/right atrium, tricuspid/pulmonic/mitral valve regurgitation, pulmonary artery hypertension and subaortic valve stenosis. -cardiology recommendations: continue to hold Eliquis. Reduced pacing rate. Plan for L/R heart cath in AM. metoprolol -daily weight/in out -reconsult PT for evaluation  Chronic atrial fibrillation S/p ablation and pacer placement. In sinus rhythm. -continue to hold Eliquis secondary to GI bleeding  Hypotension Per patient this is chronic, however, it BP is significantly low. Per discussion with cardiology, blood pressure averages in 80-90 SBP range. Likely secondary to subaortic stenosis. Required a 250 mL bolus overnight  for systolic BP in 76H -watch blood pressure -fluid support as needed  CAD S/p stent in 2012  GI bleed Biopsies obtained one week ago from EGD/colonoscopy per [facility]. GI consulted without further recommendations. No recurrent GI bleeding  Fever No leukocytosis. Low suspicion for infection but antibiotics started. Patient has a history of bloodstream infections in addition to endocarditis. Blood cultures no growth to date. Urine culture negative. Afebrile.   DVT prophylaxis: SCDs Code Status: Full code Family Communication: None at bedside Disposition Plan: Discharge to SNF   Consultants:   Gastroenterology  Cardiology  Procedures:   Echocardiogram (02/04/17)    Antimicrobials:  Vancomycin  Cefepime    Subjective: Mild dyspnea. No chest pain. Feels weak. Unable to ambulate very far without significant feeling of fatigue.  Objective: Vitals:   02/07/17 0351 02/07/17 0743 02/07/17 1216 02/07/17 1314  BP:  (!) 96/58 90/66 (!) 92/44  Pulse:  (!) 57 (!) 55   Resp: (!) 23 (!) 26 18 (!) 24  Temp:  97.6 F (36.4 C) 98.4 F (36.9 C)   TempSrc:  Oral Oral   SpO2:  94% 97%   Weight:      Height:        Intake/Output Summary (Last 24 hours) at 02/07/2017 1413 Last data filed at 02/07/2017 0842 Gross per 24 hour  Intake 990 ml  Output 426 ml  Net 564 ml   Filed Weights   02/05/17 0538 02/06/17 0919 02/07/17 0348  Weight: 79.7 kg (175 lb 12.8 oz) 80.8 kg (178 lb 1.6 oz) 79.6 kg (175 lb 8 oz)    Examination:  General exam: Appears calm  and comfortable Respiratory system: Left lower lobe rales. Right lower lobe with diminished breath sounds. Respiratory effort normal. Cardiovascular system: S1 & S2 heard, RRR. 3/6 systolic murmur with radiation to axilla Gastrointestinal system: Abdomen is nondistended, soft and nontender. Normal bowel sounds heard. Central nervous system: Alert and oriented. No focal neurological deficits. Extremities: No edema. No calf  tenderness Skin: No cyanosis. No rashes Psychiatry: Judgement and insight appear normal. Mood & affect appropriate.     Data Reviewed: I have personally reviewed following labs and imaging studies  CBC: Recent Labs  Lab 02/01/17 1500  02/02/17 1834 02/03/17 0348 02/04/17 0447 02/05/17 0427 02/06/17 0924  WBC 4.1   < > 8.4 13.0* 10.3 10.3 8.9  NEUTROABS 3.1  --   --   --   --   --   --   HGB 9.0*   < > 9.5* 10.1* 8.9* 8.8* 9.0*  HCT 27.3*   < > 28.8* 30.6* 26.7* 26.6* 27.4*  MCV 93.5   < > 93.8 93.6 94.3 94.0 94.5  PLT 87*   < > 83* 82* 73* 73* 88*   < > = values in this interval not displayed.   Basic Metabolic Panel: Recent Labs  Lab 02/02/17 0018 02/03/17 0348 02/04/17 0447 02/05/17 0427 02/06/17 0545 02/06/17 1110 02/07/17 0509  NA 138 139 140  --   --  133* 134*  K 3.9 3.7 3.2*  --   --  3.2* 3.8  CL 108 110 107  --   --  103 101  CO2 23 18* 25  --   --  23 23  GLUCOSE 110* 142* 125*  --   --  119* 127*  BUN 36* 29* 28*  --   --  29* 35*  CREATININE 1.29* 1.39* 1.48*  --   --  1.37* 1.54*  CALCIUM 8.3* 8.6* 8.2*  --   --  7.8* 8.2*  MG  --   --  1.7 2.1 1.8  --  1.8   GFR: Estimated Creatinine Clearance: 46.2 mL/min (A) (by C-G formula based on SCr of 1.54 mg/dL (H)). Liver Function Tests: Recent Labs  Lab 02/01/17 1500 02/03/17 0348  AST 34 40  ALT 16* 16*  ALKPHOS 48 55  BILITOT 1.0 2.2*  PROT 5.7* 5.8*  ALBUMIN 3.5 3.5   No results for input(s): LIPASE, AMYLASE in the last 168 hours. No results for input(s): AMMONIA in the last 168 hours. Coagulation Profile: No results for input(s): INR, PROTIME in the last 168 hours. Cardiac Enzymes: No results for input(s): CKTOTAL, CKMB, CKMBINDEX, TROPONINI in the last 168 hours. BNP (last 3 results) No results for input(s): PROBNP in the last 8760 hours. HbA1C: No results for input(s): HGBA1C in the last 72 hours. CBG: No results for input(s): GLUCAP in the last 168 hours. Lipid Profile: No results  for input(s): CHOL, HDL, LDLCALC, TRIG, CHOLHDL, LDLDIRECT in the last 72 hours. Thyroid Function Tests: No results for input(s): TSH, T4TOTAL, FREET4, T3FREE, THYROIDAB in the last 72 hours. Anemia Panel: No results for input(s): VITAMINB12, FOLATE, FERRITIN, TIBC, IRON, RETICCTPCT in the last 72 hours. Sepsis Labs: Recent Labs  Lab 02/06/17 1757 02/07/17 0509  LATICACIDVEN 2.1* 1.9    Recent Results (from the past 240 hour(s))  MRSA PCR Screening     Status: None   Collection Time: 02/02/17 12:35 AM  Result Value Ref Range Status   MRSA by PCR NEGATIVE NEGATIVE Final    Comment:  The GeneXpert MRSA Assay (FDA approved for NASAL specimens only), is one component of a comprehensive MRSA colonization surveillance program. It is not intended to diagnose MRSA infection nor to guide or monitor treatment for MRSA infections.   Culture, blood (routine x 2)     Status: None (Preliminary result)   Collection Time: 02/04/17  6:37 PM  Result Value Ref Range Status   Specimen Description BLOOD LEFT ANTECUBITAL  Final   Special Requests   Final    BOTTLES DRAWN AEROBIC AND ANAEROBIC Blood Culture adequate volume   Culture NO GROWTH 2 DAYS  Final   Report Status PENDING  Incomplete  Culture, blood (routine x 2)     Status: None (Preliminary result)   Collection Time: 02/04/17  6:37 PM  Result Value Ref Range Status   Specimen Description BLOOD LEFT HAND  Final   Special Requests   Final    BOTTLES DRAWN AEROBIC AND ANAEROBIC Blood Culture adequate volume   Culture NO GROWTH 2 DAYS  Final   Report Status PENDING  Incomplete  Culture, Urine     Status: None   Collection Time: 02/05/17  2:40 PM  Result Value Ref Range Status   Specimen Description URINE, CLEAN CATCH  Final   Special Requests NONE  Final   Culture NO GROWTH  Final   Report Status 02/06/2017 FINAL  Final         Radiology Studies: Dg Chest Port 1 View  Result Date: 02/07/2017 CLINICAL DATA:   76 year old male with shortness of breath for 1 week. EXAM: PORTABLE CHEST 1 VIEW COMPARISON:  02/05/2017. FINDINGS: Left-sided triple lead pacer device appears in stable position. The cardiomediastinal silhouette is enlarged but stable. There is continued pulmonary vascular congestion and edema. Small bilateral pleural effusions, left greater than right. No pneumothorax. No acute osseous abnormalities. IMPRESSION: Grossly stable appearance of the chest demonstrating cardiomegaly, pulmonary edema and bilateral pleural effusions. Superimposed infiltrate difficult to exclude in this setting. Electronically Signed   By: Kristopher Oppenheim M.D.   On: 02/07/2017 09:29        Scheduled Meds: . ipratropium  2 spray Each Nare QID  . LORazepam  0.25 mg Intravenous Once  . metoprolol tartrate  12.5 mg Oral BID  . pantoprazole  40 mg Oral BID   Continuous Infusions:    LOS: 6 days     Cordelia Poche, MD [facility] 02/07/2017, 2:13 PM Pager: [phone]  If 7PM-7AM, please contact night-coverage [email or web address] Password [email or web address] 02/07/2017, 2:13 PM

## 2017-02-08 LAB — BASIC METABOLIC PANEL
ANION GAP: 8 (ref 5–15)
BUN: 38 mg/dL — AB (ref 6–20)
CALCIUM: 7.9 mg/dL — AB (ref 8.9–10.3)
CO2: 23 mmol/L (ref 22–32)
CREATININE: 1.4 mg/dL — AB (ref 0.61–1.24)
Chloride: 102 mmol/L (ref 101–111)
GFR calc Af Amer: 56 mL/min — ABNORMAL LOW (ref >60)
GFR, EST NON AFRICAN AMERICAN: 48 mL/min — AB (ref >60)
GLUCOSE: 113 mg/dL — AB (ref 65–99)
Potassium: 3.5 mmol/L (ref 3.5–5.1)
Sodium: 133 mmol/L — ABNORMAL LOW (ref 135–145)

## 2017-02-08 LAB — CBC
HEMATOCRIT: 29.2 % — AB (ref 39.0–52.0)
Hemoglobin: 9.4 g/dL — ABNORMAL LOW (ref 13.0–17.0)
MCH: 30.1 pg (ref 26.0–34.0)
MCHC: 32.2 g/dL (ref 30.0–36.0)
MCV: 93.6 fL (ref 78.0–100.0)
PLATELETS: 130 10*3/uL — AB (ref 150–400)
RBC: 3.12 MIL/uL — ABNORMAL LOW (ref 4.22–5.81)
RDW: 16.3 % — AB (ref 11.5–15.5)
WBC: 9.2 10*3/uL (ref 4.0–10.5)

## 2017-02-08 LAB — MAGNESIUM: MAGNESIUM: 2 mg/dL (ref 1.7–2.4)

## 2017-02-08 LAB — PROTIME-INR
INR: 1.32
Prothrombin Time: 16.3 seconds — ABNORMAL HIGH (ref 11.4–15.2)

## 2017-02-08 MED ORDER — SODIUM CHLORIDE 0.9 % IV SOLN
INTRAVENOUS | Status: DC
  Administered 2017-02-08: 23:00:00 via INTRAVENOUS

## 2017-02-08 NOTE — H&P (View-Only) (Signed)
Subjective:  No lightheadedness Breathing marginally improved Continue to require supplemental oxygen, especially on ambulation.  Mildly elevated lactic acid on 02/06/2017 after episodes of SBP in 70s, having received 40 mg IV lasix earlier in the day. Lactic acid down from 2.1 to 1.9 after gentle hydration.   Unable to perform cath today due to scheduling issues.   Objective:  Vital Signs in the last 24 hours: Temp:  [98 F (36.7 C)-98.9 F (37.2 C)] 98 F (36.7 C) (11/05 0757) Pulse Rate:  [55-61] 61 (11/05 0757) Resp:  [18-27] 19 (11/05 0757) BP: (85-112)/(44-69) 90/69 (11/05 0641) SpO2:  [95 %-98 %] 98 % (11/05 0757) Weight:  [William.4 kg (177 lb 4.8 oz)] William.4 kg (177 lb 4.8 oz) (11/05 0641)  Intake/Output from previous day: 11/04 0701 - 11/05 0700 In: 481.7 [P.O.:240; I.V.:241.7] Out: 275 [Urine:275] Intake/Output from this shift: No intake/output data recorded.  Physical Exam: Nursing note and vitals reviewed. Constitutional: He is oriented to person, place, and time. He appears well-developed and well-nourished.  HENT:  Head: Normocephalic and atraumatic.  Neck: No JVD present.  Cardiovascular: Normal rate and regular rhythm.   Murmur (Harsh III/VI murmur RUSB) heard. Respiratory: Effort normal. He has rales (Left basal).  GI: Soft.  Musculoskeletal: He exhibits no edema.  Neurological: He is alert and oriented to person, place, and time.     Lab Results: Recent Labs    02/06/17 0924 02/08/17 0701  WBC 8.9 9.2  HGB 9.0* 9.4*  PLT 88* 130*   Recent Labs    02/07/17 0509 02/07/17 1438 02/08/17 0453  NA 134*  --  133*  K 3.8  --  3.5  CL 101  --  102  CO2 23  --  23  GLUCOSE 127*  --  113*  BUN 35*  --  38*  CREATININE 1.54* 1.63* 1.40*    Imaging:   Cardiac Studies: Study Conclusions  - Left ventricle: The ventricle is hyperdynamic with what appears   to be obstruction in the outflow tract also. The cavity size was   normal. Wall thickness  was increased in a pattern of moderate to   severe LVH. Systolic function was vigorous. The estimated   ejection fraction was in the range of 65% to 70%. Wall motion was   normal; there were no regional wall motion abnormalities. - Aortic valve: There is an extremely high gradient across the   aortic valve. It is difficult to tell if this is also from   contributiion of subaortic stenosis due to outflow tract   obstruction or contamination of the jet from a mitral   regurgitaiton jet and poor angles. Cusp separation was reduced.   Transvalvular velocity was increased. There was severe stenosis.   There was moderate regurgitation. - Mitral valve: Moderately calcified annulus. Mildly thickened   leaflets . There was moderate to severe regurgitation. - Left atrium: The atrium was severely dilated. - Right atrium: The atrium was moderately dilated. - Tricuspid valve: There was moderate regurgitation. - Pulmonic valve: There was moderate regurgitation. - Pulmonary arteries: Systolic pressure was severely increased. PA   peak pressure: 72 mm Hg (S).  02/07/2017 EXAM: PORTABLE CHEST 1 VIEW  COMPARISON:  02/05/2017.  FINDINGS: Left-sided triple lead pacer device appears in stable position.  The cardiomediastinal silhouette is enlarged but stable. There is continued pulmonary vascular congestion and edema.  Small bilateral pleural effusions, left greater than right. No pneumothorax.  No acute osseous abnormalities.  IMPRESSION: Grossly stable appearance of  the chest demonstrating cardiomegaly, pulmonary edema and bilateral pleural effusions. Superimposed infiltrate difficult to exclude in this setting.    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 He was admitted on 10/29 with lightheadedness post blood anemia after colonoscopy, and shortness of breath requiring oxygen   1. 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 Moderate valvular aortic stenosis 3. Blood loss anemia Lightheadedness: Combination of 1, 2, 3 Shortness of breath: Combination of 1, 2, 3 4. Paroxysmal atrial fib/flutter 5. BiV pacemaker placement s/p AV node ablation 6. Mildly increased creatinine, likely pre renal azotemia  Recommendations: Pacemaker reprogramming 02/05/2017: Medtronic BiV pacemaker with RA, RV (His), and LV (CS) leads. Reduced lower rate from 70 bpm to 50 bpm Underlying junctional rate in 60s, post AV nodal ablation Battery life 5 yrs  He needs suppression of his heart rate to reduce LVOT obstruction. Given that he is predominantly paced with complete AV block, we have reduced his pacing rate in hopes of reducing LVOT obstruction and subsequent symptoms. However, he does have junctional escape rhythm in 60s post AV node ablation.   Recommend continuing metoprolol 12.5 mg bid.   He is currently not in shock, but his hemodynamics are extremely tenuous due to severe LVOT obstruction, concurrent pulmonary congestion. I have discussed right heart catheterization with possibility of leaving indwelling PA catheter with him, This will give [place] more objective hemodynamic data to help his medical management. Will also consider simultaneous left and right heart catheterization to directly measure LVOT gradient and aortic valvular gradient. I will consider IV phenylephrine, depending on his invasive hemodynamics, to decrease LVOT gradient and increase his MAP, and then use beta blocker or CCB to decrease his heart rate and further reduce LVOT obstruction. Eventually, he will need to be evaluated for either myectomy or alcohol septal ablation. I have discussed this with his primary cardiologist Dr. Einar Gip and we will discuss with Dr. Jacelyn Grip  at [facility].  He will likely need transfer to [facility] for invasive hemodynamic monitoring after right heart cath placement on 02/09/2017.  Cath planned for 02/09/2017. Will hydrate overnight.  Agree with holding Eliquis given bleeding risk. His CHA2DS2VASc score is 4, but bleeding risk outweighs the benefits at this time. I've discussed this with the patient and he understands that his risk of stroke. Blood transfusion as necessary for anemia. Currently stable.    LOS: 7 days    Caressa Scearce J Sakai Heinle 02/08/2017, 8:05 AM  [place], MD [facility] Pager: [phone] Office: [phone] If no answer Cell [phone]

## 2017-02-08 NOTE — Progress Notes (Signed)
Subjective:  No lightheadedness Breathing marginally improved Continue to require supplemental oxygen, especially on ambulation.  Mildly elevated lactic acid on 02/06/2017 after episodes of SBP in 70s, having received 40 mg IV lasix earlier in the day. Lactic acid down from 2.1 to 1.9 after gentle hydration.   Unable to perform cath today due to scheduling issues.   Objective:  Vital Signs in the last 24 hours: Temp:  [98 F (36.7 C)-98.9 F (37.2 C)] 98 F (36.7 C) (11/05 0757) Pulse Rate:  [55-61] 61 (11/05 0757) Resp:  [18-27] 19 (11/05 0757) BP: (85-112)/(44-69) 90/69 (11/05 0641) SpO2:  [95 %-98 %] 98 % (11/05 0757) Weight:  [80.4 kg (177 lb 4.8 oz)] 80.4 kg (177 lb 4.8 oz) (11/05 0641)  Intake/Output from previous day: 11/04 0701 - 11/05 0700 In: 481.7 [P.O.:240; I.V.:241.7] Out: 275 [Urine:275] Intake/Output from this shift: No intake/output data recorded.  Physical Exam: Nursing note and vitals reviewed. Constitutional: He is oriented to person, place, and time. He appears well-developed and well-nourished.  HENT:  Head: Normocephalic and atraumatic.  Neck: No JVD present.  Cardiovascular: Normal rate and regular rhythm.   Murmur (Harsh III/VI murmur RUSB) heard. Respiratory: Effort normal. He has rales (Left basal).  GI: Soft.  Musculoskeletal: He exhibits no edema.  Neurological: He is alert and oriented to person, place, and time.     Lab Results: Recent Labs    02/06/17 0924 02/08/17 0701  WBC 8.9 9.2  HGB 9.0* 9.4*  PLT 88* 130*   Recent Labs    02/07/17 0509 02/07/17 1438 02/08/17 0453  NA 134*  --  133*  K 3.8  --  3.5  CL 101  --  102  CO2 23  --  23  GLUCOSE 127*  --  113*  BUN 35*  --  38*  CREATININE 1.54* 1.63* 1.40*    Imaging:   Cardiac Studies: Study Conclusions  - Left ventricle: The ventricle is hyperdynamic with what appears   to be obstruction in the outflow tract also. The cavity size was   normal. Wall thickness  was increased in a pattern of moderate to   severe LVH. Systolic function was vigorous. The estimated   ejection fraction was in the range of 65% to 70%. Wall motion was   normal; there were no regional wall motion abnormalities. - Aortic valve: There is an extremely high gradient across the   aortic valve. It is difficult to tell if this is also from   contributiion of subaortic stenosis due to outflow tract   obstruction or contamination of the jet from a mitral   regurgitaiton jet and poor angles. Cusp separation was reduced.   Transvalvular velocity was increased. There was severe stenosis.   There was moderate regurgitation. - Mitral valve: Moderately calcified annulus. Mildly thickened   leaflets . There was moderate to severe regurgitation. - Left atrium: The atrium was severely dilated. - Right atrium: The atrium was moderately dilated. - Tricuspid valve: There was moderate regurgitation. - Pulmonic valve: There was moderate regurgitation. - Pulmonary arteries: Systolic pressure was severely increased. PA   peak pressure: 72 mm Hg (S).  02/07/2017 EXAM: PORTABLE CHEST 1 VIEW  COMPARISON:  02/05/2017.  FINDINGS: Left-sided triple lead pacer device appears in stable position.  The cardiomediastinal silhouette is enlarged but stable. There is continued pulmonary vascular congestion and edema.  Small bilateral pleural effusions, left greater than right. No pneumothorax.  No acute osseous abnormalities.  IMPRESSION: Grossly stable appearance of  the chest demonstrating cardiomegaly, pulmonary edema and bilateral pleural effusions. Superimposed infiltrate difficult to exclude in this setting.    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 He was admitted on 10/29 with lightheadedness post blood anemia after colonoscopy, and shortness of breath requiring oxygen   1. 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 Moderate valvular aortic stenosis 3. Blood loss anemia Lightheadedness: Combination of 1, 2, 3 Shortness of breath: Combination of 1, 2, 3 4. Paroxysmal atrial fib/flutter 5. BiV pacemaker placement s/p AV node ablation 6. Mildly increased creatinine, likely pre renal azotemia  Recommendations: Pacemaker reprogramming 02/05/2017: Medtronic BiV pacemaker with RA, RV (His), and LV (CS) leads. Reduced lower rate from 70 bpm to 50 bpm Underlying junctional rate in 60s, post AV nodal ablation Battery life 5 yrs  He needs suppression of his heart rate to reduce LVOT obstruction. Given that he is predominantly paced with complete AV block, we have reduced his pacing rate in hopes of reducing LVOT obstruction and subsequent symptoms. However, he does have junctional escape rhythm in 60s post AV node ablation.   Recommend continuing metoprolol 12.5 mg bid.   He is currently not in shock, but his hemodynamics are extremely tenuous due to severe LVOT obstruction, concurrent pulmonary congestion. I have discussed right heart catheterization with possibility of leaving indwelling PA catheter with him, This will give [place] more objective hemodynamic data to help his medical management. Will also consider simultaneous left and right heart catheterization to directly measure LVOT gradient and aortic valvular gradient. I will consider IV phenylephrine, depending on his invasive hemodynamics, to decrease LVOT gradient and increase his MAP, and then use beta blocker or CCB to decrease his heart rate and further reduce LVOT obstruction. Eventually, he will need to be evaluated for either myectomy or alcohol septal ablation. I have discussed this with his primary cardiologist Dr. Einar Gip and we will discuss with Dr. Jacelyn Grip  at [facility].  He will likely need transfer to [facility] for invasive hemodynamic monitoring after right heart cath placement on 02/09/2017.  Cath planned for 02/09/2017. Will hydrate overnight.  Agree with holding Eliquis given bleeding risk. His CHA2DS2VASc score is 4, but bleeding risk outweighs the benefits at this time. I've discussed this with the patient and he understands that his risk of stroke. Blood transfusion as necessary for anemia. Currently stable.    LOS: 7 days    Naila Elizondo J Jasiah Buntin 02/08/2017, 8:05 AM  [place], MD [facility] [place] Pager: [phone] Office: [phone] If no answer Cell [phone]

## 2017-02-08 NOTE — Progress Notes (Signed)
PT Cancellation Note  Patient Details Name: JULIOCESAR BLASIUS MRN: [id number] DOB: May 20, 1940   Cancelled Treatment:    Reason Eval/Treat Not Completed: Medical issues which prohibited therapy(Can't exert pt per nurse. Plan for CATH tomorrow.)Will check back after CATH.  Thanks.    Godfrey Pick Raeford Brandenburg 02/08/2017, 1:14 PM [facility] [facility] [phone] (pager)

## 2017-02-08 NOTE — Plan of Care (Signed)
Bed in low position, call bell and belongings in reach, non skid socks on, pt calls appropriately for assistance

## 2017-02-08 NOTE — Plan of Care (Signed)
Patient going for heart cath this morning, right radial and neck prepped. 6 HR of prep fluid run throughout shift, patient now KVO. ASA given. HR maintained paced in the 50's, SBP 80-90's. SCD's maintained, educated patient on importance due to not currently being anticoagulated. Standing weight completed but patient was winded from activity.

## 2017-02-08 NOTE — Progress Notes (Signed)
OT Cancellation Note  Patient Details Name: [name] MRN: [id number] DOB: October 09, 1940   Cancelled Treatment:    Reason Eval/Treat Not Completed: Medical issues which prohibited therapy.  Pt scheduled for Cath tomorrow.  Will reattempt after cath if pt medically stable.  Iza Preston [place], OTR/L [phone]   Lucille Passy M 02/08/2017, 1:07 PM

## 2017-02-08 NOTE — Progress Notes (Signed)
[name]  JSE:[id number] DOB: [birth year] DOA: 02/01/2017 PCP: [name], MD   Brief Narrative: PHU RECORD is a 76 y.o. male with a history of anemia, aortic stenosis, chronic lower back pain, MI, CAD, Endocarditis, Heart murmur, HTN, Atrial Fibrillation S/P Ablation s/p pacemaker, HLD. Patient had both EGD and colonoscopy on 10/26, and was told he could resume his eliquis that night. He had 1 episode of dark BM on 10/27, but on 10/28 he had 4 dark stools and began to feel very SOB w/ exertion. On presentation to the ER, patient was found to have heme positive stools.    Assessment & Plan:   Active Problems:   Iron deficiency anemia   Hypertrophic obstructive cardiomyopathy (HCC)   HTN (hypertension)   Subaortic stenosis   Hyperlipidemia   Hypotension   Atrial fibrillation (HCC)   GI bleed   Acute on chronic diastolic CHF (congestive heart failure) (HCC)   Acute on chronic diastolic heart failure Pulmonary edema Valvular heart disease Patient follows with Dr. Einar Gip. Echo significant for LVH, EF of 65-70%, and dilation of left/right atrium, tricuspid/pulmonic/mitral valve regurgitation, pulmonary artery hypertension and subaortic valve stenosis. -cardiology recommendations: continue to hold Eliquis. Reduced pacing rate. Plan for L/R heart cath postponed until tomorrow. metoprolol -daily weight/in out  Chronic atrial fibrillation S/p ablation and pacer placement. In sinus rhythm. -continue to hold Eliquis secondary to GI bleeding; plan per cardiology: discontinue Eliquis on discharge  Hypotension Per patient this is chronic, however, it BP is significantly low. Per discussion with cardiology, blood pressure averages in 80-90 SBP range. Likely secondary to subaortic stenosis in addition to direusis. Required a 250 mL bolus on 07/3 for systolic BP in 71G -watch blood pressure -fluid support as needed  CAD S/p stent in 2012  GI  bleed Biopsies obtained one week ago from EGD/colonoscopy per [facility]. GI consulted without further recommendations. No recurrent GI bleeding  Fever No leukocytosis. Low suspicion for infection but antibiotics started. Patient has a history of bloodstream infections in addition to endocarditis. Blood cultures no growth to date. Urine culture negative. Afebrile.   DVT prophylaxis: SCDs Code Status: Full code Family Communication: None at bedside Disposition Plan: Discharge to SNF   Consultants:   Gastroenterology  Cardiology  Procedures:   Echocardiogram (02/04/17)    Antimicrobials:  Vancomycin  Cefepime    Subjective: Still with dyspnea on exertion  Objective: Vitals:   02/08/17 0014 02/08/17 0044 02/08/17 0544 02/08/17 0641  BP: (!) 87/58 (!) 85/57 (!) 97/59 90/69  Pulse:    (!) 56  Resp: (!) 22 (!) 21 19 (!) 24  Temp:    98.1 F (36.7 C)  TempSrc:    Oral  SpO2: 96% 96% 98% 95%  Weight:      Height:        Intake/Output Summary (Last 24 hours) at 02/08/2017 0643 Last data filed at 02/08/2017 0500 Gross per 24 hour  Intake 481.67 ml  Output 275 ml  Net 206.67 ml   Filed Weights   02/05/17 0538 02/06/17 0919 02/07/17 0348  Weight: 79.7 kg (175 lb 12.8 oz) 80.8 kg (178 lb 1.6 oz) 79.6 kg (175 lb 8 oz)    Examination:  General exam: Appears calm and comfortable Respiratory system: Decreased breath sounds bilaterally in lower lobes. Respiratory effort normal at rest. Cardiovascular system: S1 & S2 heard, RRR. 3/6 systolic murmur with radiation to axilla Gastrointestinal system: Abdomen is nondistended, soft and nontender. Normal  bowel sounds heard. Central nervous system: Alert and oriented. No focal neurological deficits. Extremities: No edema. No calf tenderness Skin: No cyanosis. No rashes Psychiatry: Judgement and insight appear normal. Mood & affect appropriate.     Data Reviewed: I have personally reviewed following labs and imaging  studies  CBC: Recent Labs  Lab 02/01/17 1500  02/02/17 1834 02/03/17 0348 02/04/17 0447 02/05/17 0427 02/06/17 0924  WBC 4.1   < > 8.4 13.0* 10.3 10.3 8.9  NEUTROABS 3.1  --   --   --   --   --   --   HGB 9.0*   < > 9.5* 10.1* 8.9* 8.8* 9.0*  HCT 27.3*   < > 28.8* 30.6* 26.7* 26.6* 27.4*  MCV 93.5   < > 93.8 93.6 94.3 94.0 94.5  PLT 87*   < > 83* 82* 73* 73* 88*   < > = values in this interval not displayed.   Basic Metabolic Panel: Recent Labs  Lab 02/03/17 0348 02/04/17 0447 02/05/17 0427 02/06/17 0545 02/06/17 1110 02/07/17 0509 02/07/17 1438 02/08/17 0453  NA 139 140  --   --  133* 134*  --  133*  K 3.7 3.2*  --   --  3.2* 3.8  --  3.5  CL 110 107  --   --  103 101  --  102  CO2 18* 25  --   --  23 23  --  23  GLUCOSE 142* 125*  --   --  119* 127*  --  113*  BUN 29* 28*  --   --  29* 35*  --  38*  CREATININE 1.39* 1.48*  --   --  1.37* 1.54* 1.63* 1.40*  CALCIUM 8.6* 8.2*  --   --  7.8* 8.2*  --  7.9*  MG  --  1.7 2.1 1.8  --  1.8  --  2.0   GFR: Estimated Creatinine Clearance: 50.8 mL/min (A) (by C-G formula based on SCr of 1.4 mg/dL (H)). Liver Function Tests: Recent Labs  Lab 02/01/17 1500 02/03/17 0348  AST 34 40  ALT 16* 16*  ALKPHOS 48 55  BILITOT 1.0 2.2*  PROT 5.7* 5.8*  ALBUMIN 3.5 3.5   No results for input(s): LIPASE, AMYLASE in the last 168 hours. No results for input(s): AMMONIA in the last 168 hours. Coagulation Profile: Recent Labs  Lab 02/08/17 0453  INR 1.32   Cardiac Enzymes: No results for input(s): CKTOTAL, CKMB, CKMBINDEX, TROPONINI in the last 168 hours. BNP (last 3 results) No results for input(s): PROBNP in the last 8760 hours. HbA1C: No results for input(s): HGBA1C in the last 72 hours. CBG: No results for input(s): GLUCAP in the last 168 hours. Lipid Profile: No results for input(s): CHOL, HDL, LDLCALC, TRIG, CHOLHDL, LDLDIRECT in the last 72 hours. Thyroid Function Tests: No results for input(s): TSH, T4TOTAL,  FREET4, T3FREE, THYROIDAB in the last 72 hours. Anemia Panel: No results for input(s): VITAMINB12, FOLATE, FERRITIN, TIBC, IRON, RETICCTPCT in the last 72 hours. Sepsis Labs: Recent Labs  Lab 02/06/17 1757 02/07/17 0509  LATICACIDVEN 2.1* 1.9    Recent Results (from the past 240 hour(s))  MRSA PCR Screening     Status: None   Collection Time: 02/02/17 12:35 AM  Result Value Ref Range Status   MRSA by PCR NEGATIVE NEGATIVE Final    Comment:        The GeneXpert MRSA Assay (FDA approved for NASAL specimens only), is one component  of a comprehensive MRSA colonization surveillance program. It is not intended to diagnose MRSA infection nor to guide or monitor treatment for MRSA infections.   Culture, blood (routine x 2)     Status: None (Preliminary result)   Collection Time: 02/04/17  6:37 PM  Result Value Ref Range Status   Specimen Description BLOOD LEFT ANTECUBITAL  Final   Special Requests   Final    BOTTLES DRAWN AEROBIC AND ANAEROBIC Blood Culture adequate volume   Culture NO GROWTH 3 DAYS  Final   Report Status PENDING  Incomplete  Culture, blood (routine x 2)     Status: None (Preliminary result)   Collection Time: 02/04/17  6:37 PM  Result Value Ref Range Status   Specimen Description BLOOD LEFT HAND  Final   Special Requests   Final    BOTTLES DRAWN AEROBIC AND ANAEROBIC Blood Culture adequate volume   Culture NO GROWTH 3 DAYS  Final   Report Status PENDING  Incomplete  Culture, Urine     Status: None   Collection Time: 02/05/17  2:40 PM  Result Value Ref Range Status   Specimen Description URINE, CLEAN CATCH  Final   Special Requests NONE  Final   Culture NO GROWTH  Final   Report Status 02/06/2017 FINAL  Final         Radiology Studies: Dg Chest Port 1 View  Result Date: 02/07/2017 CLINICAL DATA:  76 year old male with shortness of breath for 1 week. EXAM: PORTABLE CHEST 1 VIEW COMPARISON:  02/05/2017. FINDINGS: Left-sided triple lead pacer device  appears in stable position. The cardiomediastinal silhouette is enlarged but stable. There is continued pulmonary vascular congestion and edema. Small bilateral pleural effusions, left greater than right. No pneumothorax. No acute osseous abnormalities. IMPRESSION: Grossly stable appearance of the chest demonstrating cardiomegaly, pulmonary edema and bilateral pleural effusions. Superimposed infiltrate difficult to exclude in this setting. Electronically Signed   By: Kristopher Oppenheim M.D.   On: 02/07/2017 09:29        Scheduled Meds: . ipratropium  2 spray Each Nare QID  . LORazepam  0.25 mg Intravenous Once  . metoprolol tartrate  12.5 mg Oral BID  . pantoprazole  40 mg Oral BID  . sodium chloride flush  3 mL Intravenous Q12H   Continuous Infusions: . sodium chloride    . sodium chloride       LOS: 7 days     Cordelia Poche, MD [facility] 02/08/2017, 6:43 AM Pager: [phone]  If 7PM-7AM, please contact night-coverage [email or web address] Password [facility] 02/08/2017, 6:43 AM

## 2017-02-09 ENCOUNTER — Encounter (HOSPITAL_COMMUNITY)
Admission: EM | Disposition: A | Discharge status: Skilled Nursing Facility | Payer: Self-pay | Source: Home / Self Care | Attending: Family Medicine

## 2017-02-09 DIAGNOSIS — Q244 Congenital subaortic stenosis: Secondary | ICD-10-CM

## 2017-02-09 HISTORY — PX: RIGHT/LEFT HEART CATH AND CORONARY ANGIOGRAPHY: CATH118266

## 2017-02-09 LAB — POCT I-STAT 3, ART BLOOD GAS (G3+)
ACID-BASE DEFICIT: 4 mmol/L — AB (ref 0.0–2.0)
ACID-BASE DEFICIT: 4 mmol/L — AB (ref 0.0–2.0)
ACID-BASE DEFICIT: 4 mmol/L — AB (ref 0.0–2.0)
ACID-BASE DEFICIT: 4 mmol/L — AB (ref 0.0–2.0)
BICARBONATE: 17.7 mmol/L — AB (ref 20.0–28.0)
BICARBONATE: 18 mmol/L — AB (ref 20.0–28.0)
BICARBONATE: 18 mmol/L — AB (ref 20.0–28.0)
Bicarbonate: 18.1 mmol/L — ABNORMAL LOW (ref 20.0–28.0)
O2 SAT: 95 %
O2 SAT: 94 %
O2 SAT: 94 %
O2 Saturation: 95 %
PCO2 ART: 22.9 mmHg — AB (ref 32.0–48.0)
PH ART: 7.518 — AB (ref 7.350–7.450)
PO2 ART: 66 mmHg — AB (ref 83.0–108.0)
PO2 ART: 61 mmHg — AB (ref 83.0–108.0)
TCO2: 18 mmol/L — ABNORMAL LOW (ref 22–32)
TCO2: 19 mmol/L — AB (ref 22–32)
TCO2: 19 mmol/L — AB (ref 22–32)
TCO2: 19 mmol/L — ABNORMAL LOW (ref 22–32)
pCO2 arterial: 21.1 mmHg — ABNORMAL LOW (ref 32.0–48.0)
pCO2 arterial: 22.3 mmHg — ABNORMAL LOW (ref 32.0–48.0)
pCO2 arterial: 22.4 mmHg — ABNORMAL LOW (ref 32.0–48.0)
pH, Arterial: 7.532 — ABNORMAL HIGH (ref 7.350–7.450)
pH, Arterial: 7.505 — ABNORMAL HIGH (ref 7.350–7.450)
pH, Arterial: 7.512 — ABNORMAL HIGH (ref 7.350–7.450)
pO2, Arterial: 60 mmHg — ABNORMAL LOW (ref 83.0–108.0)
pO2, Arterial: 66 mmHg — ABNORMAL LOW (ref 83.0–108.0)

## 2017-02-09 LAB — CULTURE, BLOOD (ROUTINE X 2)
Culture: NO GROWTH
Culture: NO GROWTH
Special Requests: ADEQUATE
Special Requests: ADEQUATE

## 2017-02-09 LAB — LACTIC ACID, PLASMA: Lactic Acid, Venous: 1.9 mmol/L (ref 0.5–1.9)

## 2017-02-09 LAB — POCT I-STAT 3, VENOUS BLOOD GAS (G3P V)
ACID-BASE DEFICIT: 1 mmol/L (ref 0.0–2.0)
ACID-BASE DEFICIT: 2 mmol/L (ref 0.0–2.0)
ACID-BASE DEFICIT: 2 mmol/L (ref 0.0–2.0)
Acid-base deficit: 1 mmol/L (ref 0.0–2.0)
BICARBONATE: 22.1 mmol/L (ref 20.0–28.0)
Bicarbonate: 22 mmol/L (ref 20.0–28.0)
Bicarbonate: 20.7 mmol/L (ref 20.0–28.0)
Bicarbonate: 21.5 mmol/L (ref 20.0–28.0)
O2 SAT: 30 %
O2 SAT: 34 %
O2 SAT: 29 %
O2 SAT: 29 %
PCO2 VEN: 30.4 mmHg — AB (ref 44.0–60.0)
PCO2 VEN: 30.6 mmHg — AB (ref 44.0–60.0)
PO2 VEN: 17 mmHg — AB (ref 32.0–45.0)
TCO2: 23 mmol/L (ref 22–32)
TCO2: 23 mmol/L (ref 22–32)
TCO2: 22 mmol/L (ref 22–32)
TCO2: 22 mmol/L (ref 22–32)
pCO2, Ven: 29 mmHg — ABNORMAL LOW (ref 44.0–60.0)
pCO2, Ven: 29.3 mmHg — ABNORMAL LOW (ref 44.0–60.0)
pH, Ven: 7.466 — ABNORMAL HIGH (ref 7.250–7.430)
pH, Ven: 7.468 — ABNORMAL HIGH (ref 7.250–7.430)
pH, Ven: 7.462 — ABNORMAL HIGH (ref 7.250–7.430)
pH, Ven: 7.474 — ABNORMAL HIGH (ref 7.250–7.430)
pO2, Ven: 18 mmHg — CL (ref 32.0–45.0)
pO2, Ven: 19 mmHg — CL (ref 32.0–45.0)
pO2, Ven: 17 mmHg — CL (ref 32.0–45.0)

## 2017-02-09 LAB — BASIC METABOLIC PANEL
ANION GAP: 11 (ref 5–15)
BUN: 38 mg/dL — ABNORMAL HIGH (ref 6–20)
CALCIUM: 8 mg/dL — AB (ref 8.9–10.3)
CHLORIDE: 102 mmol/L (ref 101–111)
CO2: 21 mmol/L — ABNORMAL LOW (ref 22–32)
CREATININE: 1.4 mg/dL — AB (ref 0.61–1.24)
GFR calc non Af Amer: 48 mL/min — ABNORMAL LOW (ref >60)
GFR, EST AFRICAN AMERICAN: 56 mL/min — AB (ref >60)
Glucose, Bld: 125 mg/dL — ABNORMAL HIGH (ref 65–99)
Potassium: 3.6 mmol/L (ref 3.5–5.1)
SODIUM: 134 mmol/L — AB (ref 135–145)

## 2017-02-09 LAB — COOXEMETRY PANEL
Carboxyhemoglobin: 1.1 % (ref 0.5–1.5)
Methemoglobin: 1.3 % (ref 0.0–1.5)
O2 Saturation: 24.3 %
TOTAL HEMOGLOBIN: 9 g/dL — AB (ref 12.0–16.0)

## 2017-02-09 LAB — POCT ACTIVATED CLOTTING TIME: ACTIVATED CLOTTING TIME: 169 s

## 2017-02-09 LAB — MAGNESIUM: Magnesium: 2.1 mg/dL (ref 1.7–2.4)

## 2017-02-09 SURGERY — RIGHT/LEFT HEART CATH AND CORONARY ANGIOGRAPHY
Anesthesia: LOCAL

## 2017-02-09 MED ORDER — SODIUM CHLORIDE 0.9 % IV SOLN
INTRAVENOUS | Status: AC | PRN
  Administered 2017-02-09: 999 mL/h via INTRAVENOUS

## 2017-02-09 MED ORDER — MIDAZOLAM HCL 2 MG/2ML IJ SOLN
INTRAMUSCULAR | Status: DC | PRN
  Administered 2017-02-09: 0.5 mg via INTRAVENOUS

## 2017-02-09 MED ORDER — LIDOCAINE HCL (PF) 1 % IJ SOLN
INTRAMUSCULAR | Status: DC | PRN
Start: 2017-02-09 — End: 2017-02-09
  Administered 2017-02-09: 5 mL

## 2017-02-09 MED ORDER — SODIUM CHLORIDE 0.9% FLUSH
3.0000 mL | INTRAVENOUS | Status: DC | PRN
  Administered 2017-02-14: 3 mL via INTRAVENOUS
  Filled 2017-02-09: qty 3

## 2017-02-09 MED ORDER — SODIUM CHLORIDE 0.9 % IV SOLN
INTRAVENOUS | Status: DC
  Administered 2017-02-09: 16:00:00 via INTRAVENOUS

## 2017-02-09 MED ORDER — VERAPAMIL HCL 2.5 MG/ML IV SOLN
INTRAVENOUS | Status: AC
  Filled 2017-02-09: qty 2

## 2017-02-09 MED ORDER — HEPARIN (PORCINE) IN NACL 2-0.9 UNIT/ML-% IJ SOLN
INTRAMUSCULAR | Status: AC
  Filled 2017-02-09: qty 1000

## 2017-02-09 MED ORDER — HEPARIN (PORCINE) IN NACL 2-0.9 UNIT/ML-% IJ SOLN
INTRAMUSCULAR | Status: AC | PRN
  Administered 2017-02-09: 1000 mL via INTRA_ARTERIAL

## 2017-02-09 MED ORDER — SODIUM CHLORIDE 0.9 % IV SOLN: 250.0000 mL | INTRAVENOUS | Status: DC | PRN

## 2017-02-09 MED ORDER — HEPARIN (PORCINE) IN NACL 2-0.9 UNIT/ML-% IJ SOLN
INTRAMUSCULAR | Status: DC | PRN
  Administered 2017-02-09: 3 mL via INTRA_ARTERIAL

## 2017-02-09 MED ORDER — HEPARIN SODIUM (PORCINE) 1000 UNIT/ML IJ SOLN
INTRAMUSCULAR | Status: AC
  Filled 2017-02-09: qty 1

## 2017-02-09 MED ORDER — FENTANYL CITRATE (PF) 100 MCG/2ML IJ SOLN
INTRAMUSCULAR | Status: AC
  Filled 2017-02-09: qty 2

## 2017-02-09 MED ORDER — ACETAMINOPHEN 325 MG PO TABS
650.0000 mg | ORAL_TABLET | ORAL | Status: DC | PRN
  Administered 2017-02-13 – 2017-02-14 (×2): 650 mg via ORAL
  Filled 2017-02-09 (×2): qty 2

## 2017-02-09 MED ORDER — IOPAMIDOL (ISOVUE-370) INJECTION 76%
INTRAVENOUS | Status: DC | PRN
  Administered 2017-02-09: 40 mL via INTRAVENOUS

## 2017-02-09 MED ORDER — ONDANSETRON HCL 4 MG/2ML IJ SOLN: 4.0000 mg | Freq: Four times a day (QID) | INTRAMUSCULAR | Status: DC | PRN

## 2017-02-09 MED ORDER — SODIUM CHLORIDE 0.9 % IV SOLN
40.0000 ug/min | INTRAVENOUS | Status: DC
  Administered 2017-02-09 – 2017-02-10 (×4): 40 ug/min via INTRAVENOUS
  Filled 2017-02-09 (×4): qty 1

## 2017-02-09 MED ORDER — PHENYLEPHRINE HCL 10 MG/ML IJ SOLN
INTRAMUSCULAR | Status: AC
  Filled 2017-02-09: qty 1

## 2017-02-09 MED ORDER — SODIUM CHLORIDE 0.9% FLUSH
3.0000 mL | Freq: Two times a day (BID) | INTRAVENOUS | Status: DC
  Administered 2017-02-09 – 2017-02-26 (×29): 3 mL via INTRAVENOUS

## 2017-02-09 MED ORDER — IOPAMIDOL (ISOVUE-370) INJECTION 76%
INTRAVENOUS | Status: AC
  Filled 2017-02-09: qty 100

## 2017-02-09 MED ORDER — FENTANYL CITRATE (PF) 100 MCG/2ML IJ SOLN
INTRAMUSCULAR | Status: DC | PRN
  Administered 2017-02-09 (×2): 12.5 ug via INTRAVENOUS

## 2017-02-09 MED ORDER — PHENYLEPHRINE HCL 10 MG/ML IJ SOLN
INTRAVENOUS | Status: DC | PRN
  Administered 2017-02-09: 10 ug/min via INTRAVENOUS

## 2017-02-09 MED ORDER — MIDAZOLAM HCL 2 MG/2ML IJ SOLN
INTRAMUSCULAR | Status: AC
  Filled 2017-02-09: qty 2

## 2017-02-09 MED ORDER — LIDOCAINE HCL (PF) 1 % IJ SOLN
INTRAMUSCULAR | Status: AC
  Filled 2017-02-09: qty 30

## 2017-02-09 MED ORDER — HEPARIN SODIUM (PORCINE) 1000 UNIT/ML IJ SOLN
INTRAMUSCULAR | Status: DC | PRN
  Administered 2017-02-09 (×2): 2000 [IU] via INTRAVENOUS

## 2017-02-09 SURGICAL SUPPLY — 21 items
CATH INFINITI 5 FR JL3.5 (CATHETERS) ×1 IMPLANT
CATH INFINITI 5FR AL1 (CATHETERS) ×1 IMPLANT
CATH LANGSTON DUAL LUM PIG 6FR (CATHETERS) ×2 IMPLANT
CATH OPTITORQUE TIG 4.0 5F (CATHETERS) ×1 IMPLANT
CATH SWAN GANZ VIP 7.5F (CATHETERS) ×1 IMPLANT
COVER PRB 48X5XTLSCP FOLD TPE (BAG) IMPLANT
COVER PROBE 5X48 (BAG) ×2
DEVICE RAD COMP TR BAND LRG (VASCULAR PRODUCTS) ×2 IMPLANT
GLIDESHEATH SLEND A-KIT 6F 22G (SHEATH) ×1 IMPLANT
GUIDEWIRE INQWIRE 1.5J.035X260 (WIRE) IMPLANT
INQWIRE 1.5J .035X260CM (WIRE) ×2
KIT HEART LEFT (KITS) ×2 IMPLANT
KIT MICROINTRODUCER STIFF 5F (SHEATH) ×1 IMPLANT
PACK CARDIAC CATHETERIZATION (CUSTOM PROCEDURE TRAY) ×2 IMPLANT
SHEATH GLIDE SLENDER 4/5FR (SHEATH) ×1 IMPLANT
SHEATH PINNACLE 7F 10CM (SHEATH) IMPLANT
SHEATH PINNACLE 8F 10CM (SHEATH) ×2 IMPLANT
SLEEVE REPOSITIONING LENGTH 30 (MISCELLANEOUS) ×2 IMPLANT
TRANSDUCER W/STOPCOCK (MISCELLANEOUS) ×3 IMPLANT
TUBING CIL FLEX 10 FLL-RA (TUBING) ×2 IMPLANT
WIRE MICROINTRODUCER 60CM (WIRE) ×1 IMPLANT

## 2017-02-09 NOTE — Progress Notes (Addendum)
Subjective:  No lightheadedness Breathing marginally improved Continue to require supplemental oxygen, especially on ambulation.  Underwent LHC/RHC/Cor angiogram today. Peak LV-AO gradient >150 mmhg secondary to HOCM Aortic valve mean gradient 25 mmhg  Objective:  Vital Signs in the last 24 hours: Temp:  [97.7 F (36.5 C)-99.1 F (37.3 C)] 99 F (37.2 C) (11/06 1700) Pulse Rate:  [0-63] 50 (11/06 1700) Resp:  [0-33] 27 (11/06 1700) BP: (80-108)/(46-65) 91/52 (11/06 1457) SpO2:  [0 %-100 %] 94 % (11/06 1700)  Intake/Output from previous day: 11/05 0701 - 11/06 0700 In: 1021.7 [P.O.:630; I.V.:391.7] Out: 202 [Urine:201; Stool:1] Intake/Output from this shift: Total I/O In: 0  Out: 2 [Urine:1; Stool:1]  Physical Exam: Nursing note and vitals reviewed. Constitutional: He is oriented to person, place, and time. He appears well-developed and well-nourished.  HENT:  Head: Normocephalic and atraumatic.  Neck: Rt IJ PA catheter in place Cardiovascular: Normal rate and regular rhythm.   Murmur (Harsh III/VI murmur RUSB) heard. Respiratory: Effort normal. He has rales (Left basal).  Rt radial cath site with no bleeding/hematoma GI: Soft.  Musculoskeletal: He exhibits no edema.  Neurological: He is alert and oriented to person, place, and time.     Lab Results: Recent Labs    02/08/17 0701  WBC 9.2  HGB 9.4*  PLT 130*   Recent Labs    02/08/17 0453 02/09/17 0523  NA 133* 134*  K 3.5 3.6  CL 102 102  CO2 23 21*  GLUCOSE 113* 125*  BUN 38* 38*  CREATININE 1.40* 1.40*    Imaging:   Cardiac Studies: Study Conclusions  - Left ventricle: The ventricle is hyperdynamic with what appears   to be obstruction in the outflow tract also. The cavity size was   normal. Wall thickness was increased in a pattern of moderate to   severe LVH. Systolic function was vigorous. The estimated   ejection fraction was in the range of 65% to 70%. Wall motion was   normal;  there were no regional wall motion abnormalities. - Aortic valve: There is an extremely high gradient across the   aortic valve. It is difficult to tell if this is also from   contributiion of subaortic stenosis due to outflow tract   obstruction or contamination of the jet from a mitral   regurgitaiton jet and poor angles. Cusp separation was reduced.   Transvalvular velocity was increased. There was severe stenosis.   There was moderate regurgitation. - Mitral valve: Moderately calcified annulus. Mildly thickened   leaflets . There was moderate to severe regurgitation. - Left atrium: The atrium was severely dilated. - Right atrium: The atrium was moderately dilated. - Tricuspid valve: There was moderate regurgitation. - Pulmonic valve: There was moderate regurgitation. - Pulmonary arteries: Systolic pressure was severely increased. PA   peak pressure: 72 mm Hg (S).  02/07/2017 EXAM: PORTABLE CHEST 1 VIEW  COMPARISON:  02/05/2017.  FINDINGS: Left-sided triple lead pacer device appears in stable position.  The cardiomediastinal silhouette is enlarged but stable. There is continued pulmonary vascular congestion and edema.  Small bilateral pleural effusions, left greater than right. No pneumothorax.  No acute osseous abnormalities.  IMPRESSION: Grossly stable appearance of the chest demonstrating cardiomegaly, pulmonary edema and bilateral pleural effusions. Superimposed infiltrate difficult to exclude in this setting.    Assessment:  76 y/o male with hypertrophic cardiomyopathy (severe basal hypertrophy) w/LVOT obstruction, paroxysmal Afib/flutter on eliquis, s/p AV nodal ablation and BIV pacemaker placement 09/2016, CAD s/p mid LAD PCI 2012,  h/o colon mass s/p hemicolectomy, blood loss anemia. He was admitted on 10/29 with lightheadedness post blood anemia after colonoscopy, and shortness of breath requiring oxygen   1. 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 Moderate valvular aortic stenosis 3. Blood loss anemia 4. Cardiogenic shock: Due to #1LHC/RHC, Cor angiogram 02/09/2017  CI 1.6 L/m2. PA sats 29% 5. Paroxysmal atrial fib/flutter 6. BiV pacemaker placement s/p AV node ablation 7. Mildly increased creatinine, likely pre renal azotemia  Recommendations: Pacemaker reprogramming 02/10/2017: [employer] BiV pacemaker with RA, RV (His), and LV (CS) leads. CS lead turned off to achieve asynchronous pacing Underlying rhythm AF with junctional rate in 60s, post AV nodal ablation Battery life 5 yrs  Recommend Phenylephrine at 40 mcg/min to decrease dynamic LVOT obstruction. If PA pressures continue to stay elevated, he may need discontinuation of phenylephrine and/or diuresis. Recommend continuing metoprolol 12.5 mg bid to achieve negative inotropy.  I discussed the case with heart failure specialist Dr. Haroldine Laws, who also discussed the case with Dr. Jacelyn Grip at [facility]. Eventually, he will need to be evaluated for either myectomy or alcohol septal ablation. I have discussed this with his primary cardiologist Dr. Einar Gip and we will discuss with Dr. Jacelyn Grip at [facility].   Agree with holding Eliquis given bleeding risk. His CHA2DS2VASc score is 4, but bleeding risk outweighs the benefits at this time. I've discussed this with the patient and he understands that his risk of stroke. Blood transfusion as necessary for anemia. Currently stable.    LOS: 8 days    Renelda Kilian J Veleria Barnhardt 02/09/2017, 5:21 PM  [place], MD [facility] Cardi[facility]. [place] Pager: [phone] Office: [phone] If no answer Cell [phone]

## 2017-02-09 NOTE — Interval H&P Note (Signed)
History and Physical Interval Note:  02/09/2017 12:07 PM  [name]  has presented today for surgery, with the diagnosis of cp  The various methods of treatment have been discussed with the patient and family. After consideration of risks, benefits and other options for treatment, the patient has consented to  Procedure(s): LEFT HEART CATH AND CORONARY ANGIOGRAPHY (N/A) as a surgical intervention .  The patient's history has been reviewed, patient examined, no change in status, stable for surgery.  I have reviewed the patient's chart and labs.  Questions were answered to the patient's satisfaction.    Hypertrophic cardiomyopathy, moderate aortic stenosis, with heart failure decompensation.   Indication:  1. Re-evaluation of known cardiomyopathy 2. Change in clinical status or cardiac exam or to guide therapy A (7) Indication: 94; Score 7      Lauraann Missey J Kaitlyn Franko

## 2017-02-09 NOTE — Care Management Important Message (Signed)
Important Message  Patient Details  Name: [name] MRN: [id number] Date of Birth: 20-Nov-1940   Medicare Important Message Given:  Yes    Nathen May 02/09/2017, 10:31 AM

## 2017-02-09 NOTE — Progress Notes (Signed)
PROGRESS NOTE    [name]  KKX:[id number] DOB: Oct 22, 1940 DOA: 02/01/2017 PCP: [name], MD   Brief Narrative: [name] is a 76 y.o. male with a history of anemia, aortic stenosis, chronic lower back pain, MI, CAD, Endocarditis, Heart murmur, HTN, Atrial Fibrillation S/P Ablation s/p pacemaker, HLD. Patient had both EGD and colonoscopy on 10/26, and was told he could resume his eliquis that night. He had 1 episode of dark BM on 10/27, but on 10/28 he had 4 dark stools and began to feel very SOB w/ exertion. On presentation to the ER, patient was found to have heme positive stools.    Assessment & Plan:   Active Problems:   Iron deficiency anemia   Hypertrophic obstructive cardiomyopathy (HCC)   HTN (hypertension)   Subaortic stenosis   Hyperlipidemia   Hypotension   Atrial fibrillation (HCC)   GI bleed   Acute on chronic diastolic CHF (congestive heart failure) (HCC)   Acute on chronic diastolic heart failure Pulmonary edema Valvular heart disease Patient follows with Dr. Einar Gip. Echo significant for LVH, EF of 65-70%, and dilation of left/right atrium, tricuspid/pulmonic/mitral valve regurgitation, pulmonary artery hypertension and subaortic valve stenosis. -cardiology recommendations: continue to hold Eliquis. Reduced pacing rate. Plan for L/R heart cath today. Metoprolol -daily weight/in out  Chronic atrial fibrillation S/p ablation and pacer placement. In sinus rhythm. -continue to hold Eliquis secondary to GI bleeding; plan per cardiology: discontinue Eliquis on discharge  Hypotension Per patient this is chronic, however, it BP is significantly low. Per discussion with cardiology, blood pressure averages in 80-90 SBP range. Likely secondary to subaortic stenosis in addition to direusis. Required a 250 mL bolus on 16/9 for systolic BP in 67E -watch blood pressure -fluid support as needed  CAD S/p stent in 2012  GI bleed Biopsies obtained one  week ago from EGD/colonoscopy per [facility]. GI consulted without further recommendations. No recurrent GI bleeding  Fever No leukocytosis. Low suspicion for infection but antibiotics started. Patient has a history of bloodstream infections in addition to endocarditis. Blood cultures no growth to date. Urine culture negative. Afebrile. No recurrent fevers.  Thrombocytopenia Improving.   DVT prophylaxis: SCDs Code Status: Full code Family Communication: None at bedside Disposition Plan: Discharge to SNF   Consultants:   Gastroenterology  Cardiology  Procedures:   Echocardiogram (02/04/17)    Antimicrobials:  Vancomycin  Cefepime    Subjective: On the commode.  Objective: Vitals:   02/09/17 1003 02/09/17 1123 02/09/17 1233 02/09/17 1246  BP: (!) 105/51 91/61    Pulse: (!) 52 (!) 50    Resp:  19    Temp:  97.7 F (36.5 C)    TempSrc:  Oral    SpO2:  97% 95% 90%  Weight:      Height:        Intake/Output Summary (Last 24 hours) at 02/09/2017 1305 Last data filed at 02/09/2017 1247 Gross per 24 hour  Intake 541.67 ml  Output 204 ml  Net 337.67 ml   Filed Weights   02/06/17 0919 02/07/17 0348 02/08/17 0641  Weight: 80.8 kg (178 lb 1.6 oz) 79.6 kg (175 lb 8 oz) 80.4 kg (177 lb 4.8 oz)    Examination:  General exam: Appears calm and comfortable Respiratory system: Respiratory effort normal Central nervous system: Alert and oriented. No focal neurological deficits. Psychiatry: Judgement and insight appear normal. Mood & affect appropriate.     Data Reviewed: I have personally reviewed following labs and imaging  studies  CBC: Recent Labs  Lab 02/03/17 0348 02/04/17 0447 02/05/17 0427 02/06/17 0924 02/08/17 0701  WBC 13.0* 10.3 10.3 8.9 9.2  HGB 10.1* 8.9* 8.8* 9.0* 9.4*  HCT 30.6* 26.7* 26.6* 27.4* 29.2*  MCV 93.6 94.3 94.0 94.5 93.6  PLT 82* 73* 73* 88* 073*   Basic Metabolic Panel: Recent Labs  Lab 02/04/17 0447 02/05/17 0427  02/06/17 0545 02/06/17 1110 02/07/17 0509 02/07/17 1438 02/08/17 0453 02/09/17 0523  NA 140  --   --  133* 134*  --  133* 134*  K 3.2*  --   --  3.2* 3.8  --  3.5 3.6  CL 107  --   --  103 101  --  102 102  CO2 25  --   --  23 23  --  23 21*  GLUCOSE 125*  --   --  119* 127*  --  113* 125*  BUN 28*  --   --  29* 35*  --  38* 38*  CREATININE 1.48*  --   --  1.37* 1.54* 1.63* 1.40* 1.40*  CALCIUM 8.2*  --   --  7.8* 8.2*  --  7.9* 8.0*  MG 1.7 2.1 1.8  --  1.8  --  2.0 2.1   GFR: Estimated Creatinine Clearance: 50.8 mL/min (A) (by C-G formula based on SCr of 1.4 mg/dL (H)). Liver Function Tests: Recent Labs  Lab 02/03/17 0348  AST 40  ALT 16*  ALKPHOS 55  BILITOT 2.2*  PROT 5.8*  ALBUMIN 3.5   No results for input(s): LIPASE, AMYLASE in the last 168 hours. No results for input(s): AMMONIA in the last 168 hours. Coagulation Profile: Recent Labs  Lab 02/08/17 0453  INR 1.32   Cardiac Enzymes: No results for input(s): CKTOTAL, CKMB, CKMBINDEX, TROPONINI in the last 168 hours. BNP (last 3 results) No results for input(s): PROBNP in the last 8760 hours. HbA1C: No results for input(s): HGBA1C in the last 72 hours. CBG: No results for input(s): GLUCAP in the last 168 hours. Lipid Profile: No results for input(s): CHOL, HDL, LDLCALC, TRIG, CHOLHDL, LDLDIRECT in the last 72 hours. Thyroid Function Tests: No results for input(s): TSH, T4TOTAL, FREET4, T3FREE, THYROIDAB in the last 72 hours. Anemia Panel: No results for input(s): VITAMINB12, FOLATE, FERRITIN, TIBC, IRON, RETICCTPCT in the last 72 hours. Sepsis Labs: Recent Labs  Lab 02/06/17 1757 02/07/17 0509  LATICACIDVEN 2.1* 1.9    Recent Results (from the past 240 hour(s))  MRSA PCR Screening     Status: None   Collection Time: 02/02/17 12:35 AM  Result Value Ref Range Status   MRSA by PCR NEGATIVE NEGATIVE Final    Comment:        The GeneXpert MRSA Assay (FDA approved for NASAL specimens only), is one  component of a comprehensive MRSA colonization surveillance program. It is not intended to diagnose MRSA infection nor to guide or monitor treatment for MRSA infections.   Culture, blood (routine x 2)     Status: None   Collection Time: 02/04/17  6:37 PM  Result Value Ref Range Status   Specimen Description BLOOD LEFT ANTECUBITAL  Final   Special Requests   Final    BOTTLES DRAWN AEROBIC AND ANAEROBIC Blood Culture adequate volume   Culture NO GROWTH 5 DAYS  Final   Report Status 02/09/2017 FINAL  Final  Culture, blood (routine x 2)     Status: None   Collection Time: 02/04/17  6:37 PM  Result  Value Ref Range Status   Specimen Description BLOOD LEFT HAND  Final   Special Requests   Final    BOTTLES DRAWN AEROBIC AND ANAEROBIC Blood Culture adequate volume   Culture NO GROWTH 5 DAYS  Final   Report Status 02/09/2017 FINAL  Final  Culture, Urine     Status: None   Collection Time: 02/05/17  2:40 PM  Result Value Ref Range Status   Specimen Description URINE, CLEAN CATCH  Final   Special Requests NONE  Final   Culture NO GROWTH  Final   Report Status 02/06/2017 FINAL  Final         Radiology Studies: No results found.      Scheduled Meds: . [MAR Hold] ipratropium  2 spray Each Nare QID  . [MAR Hold] metoprolol tartrate  12.5 mg Oral BID  . [MAR Hold] pantoprazole  40 mg Oral BID  . sodium chloride flush  3 mL Intravenous Q12H   Continuous Infusions: . sodium chloride    . sodium chloride    . sodium chloride 50 mL/hr at 02/08/17 2310  . sodium chloride 999 mL/hr (02/09/17 1301)  . heparin       LOS: 8 days     Cordelia Poche, MD [facility] 02/09/2017, 1:05 PM Pager: [phone]  If 7PM-7AM, please contact night-coverage [email or web address] Password [email or web address] 02/09/2017, 1:05 PM

## 2017-02-09 NOTE — Progress Notes (Signed)
Nonobstructive CAD.  Severe LVOT obstruction Moderate aortic valve stenosis.  Transfer to [place]  Start phenylephrine at 40 mcg/min Follow PA sats and cardiac output.  Will make adjustments to pacemaker settings.  Nigel Mormon, MD [facility]. PA Pager: [phone] Office: [phone] If no answer Cell [phone]

## 2017-02-10 ENCOUNTER — Encounter (HOSPITAL_COMMUNITY): Payer: Self-pay | Admitting: Cardiology

## 2017-02-10 DIAGNOSIS — D509 Iron deficiency anemia, unspecified: Secondary | ICD-10-CM

## 2017-02-10 DIAGNOSIS — R0600 Dyspnea, unspecified: Secondary | ICD-10-CM

## 2017-02-10 DIAGNOSIS — I9589 Other hypotension: Secondary | ICD-10-CM

## 2017-02-10 DIAGNOSIS — I421 Obstructive hypertrophic cardiomyopathy: Secondary | ICD-10-CM

## 2017-02-10 DIAGNOSIS — I5033 Acute on chronic diastolic (congestive) heart failure: Secondary | ICD-10-CM

## 2017-02-10 DIAGNOSIS — E785 Hyperlipidemia, unspecified: Secondary | ICD-10-CM

## 2017-02-10 LAB — COOXEMETRY PANEL
CARBOXYHEMOGLOBIN: 1.2 % (ref 0.5–1.5)
CARBOXYHEMOGLOBIN: 1.6 % — AB (ref 0.5–1.5)
METHEMOGLOBIN: 1.2 % (ref 0.0–1.5)
Methemoglobin: 1 % (ref 0.0–1.5)
O2 SAT: 45 %
O2 Saturation: 26.3 %
TOTAL HEMOGLOBIN: 7.6 g/dL — AB (ref 12.0–16.0)
Total hemoglobin: 8.8 g/dL — ABNORMAL LOW (ref 12.0–16.0)

## 2017-02-10 LAB — CBC WITH DIFFERENTIAL/PLATELET
BASOS ABS: 0 10*3/uL (ref 0.0–0.1)
BASOS PCT: 0 %
Eosinophils Absolute: 0 10*3/uL (ref 0.0–0.7)
Eosinophils Relative: 0 %
HEMATOCRIT: 27.9 % — AB (ref 39.0–52.0)
Hemoglobin: 8.6 g/dL — ABNORMAL LOW (ref 13.0–17.0)
LYMPHS PCT: 13 %
Lymphs Abs: 1.5 10*3/uL (ref 0.7–4.0)
MCH: 29.5 pg (ref 26.0–34.0)
MCHC: 30.8 g/dL (ref 30.0–36.0)
MCV: 95.5 fL (ref 78.0–100.0)
MONO ABS: 1.6 10*3/uL — AB (ref 0.1–1.0)
Monocytes Relative: 13 %
NEUTROS ABS: 8.6 10*3/uL — AB (ref 1.7–7.7)
NEUTROS PCT: 74 %
Platelets: 152 10*3/uL (ref 150–400)
RBC: 2.92 MIL/uL — AB (ref 4.22–5.81)
RDW: 17.2 % — AB (ref 11.5–15.5)
WBC: 11.7 10*3/uL — AB (ref 4.0–10.5)

## 2017-02-10 LAB — BASIC METABOLIC PANEL
Anion gap: 9 (ref 5–15)
BUN: 41 mg/dL — AB (ref 6–20)
CHLORIDE: 106 mmol/L (ref 101–111)
CO2: 21 mmol/L — AB (ref 22–32)
CREATININE: 1.35 mg/dL — AB (ref 0.61–1.24)
Calcium: 8 mg/dL — ABNORMAL LOW (ref 8.9–10.3)
GFR calc Af Amer: 58 mL/min — ABNORMAL LOW (ref >60)
GFR calc non Af Amer: 50 mL/min — ABNORMAL LOW (ref >60)
GLUCOSE: 118 mg/dL — AB (ref 65–99)
Potassium: 4 mmol/L (ref 3.5–5.1)
SODIUM: 136 mmol/L (ref 135–145)

## 2017-02-10 LAB — LACTIC ACID, PLASMA
LACTIC ACID, VENOUS: 1.5 mmol/L (ref 0.5–1.9)
LACTIC ACID, VENOUS: 1.6 mmol/L (ref 0.5–1.9)
Lactic Acid, Venous: 1.7 mmol/L (ref 0.5–1.9)

## 2017-02-10 LAB — MAGNESIUM: Magnesium: 2.2 mg/dL (ref 1.7–2.4)

## 2017-02-10 LAB — PHOSPHORUS: Phosphorus: 3.3 mg/dL (ref 2.5–4.6)

## 2017-02-10 MED ORDER — DISOPYRAMIDE PHOSPHATE ER 100 MG PO CP12
200.0000 mg | ORAL_CAPSULE | Freq: Once | ORAL | Status: AC
  Administered 2017-02-10: 200 mg via ORAL
  Filled 2017-02-10: qty 2

## 2017-02-10 MED ORDER — MIDODRINE HCL 5 MG PO TABS
10.0000 mg | ORAL_TABLET | Freq: Three times a day (TID) | ORAL | Status: DC
  Administered 2017-02-10 – 2017-02-13 (×10): 10 mg via ORAL
  Filled 2017-02-10 (×10): qty 2

## 2017-02-10 MED ORDER — SODIUM CHLORIDE 0.9 % IV SOLN
40.0000 ug/min | INTRAVENOUS | Status: DC
  Filled 2017-02-10: qty 4

## 2017-02-10 MED ORDER — DISOPYRAMIDE PHOSPHATE ER 100 MG PO CP12
100.0000 mg | ORAL_CAPSULE | Freq: Two times a day (BID) | ORAL | Status: DC
  Administered 2017-02-10 – 2017-02-12 (×4): 100 mg via ORAL
  Filled 2017-02-10 (×4): qty 1

## 2017-02-10 MED ORDER — DILTIAZEM HCL 30 MG PO TABS
30.0000 mg | ORAL_TABLET | Freq: Four times a day (QID) | ORAL | Status: DC
  Administered 2017-02-10 – 2017-02-11 (×3): 30 mg via ORAL
  Filled 2017-02-10 (×4): qty 1

## 2017-02-10 NOTE — Progress Notes (Signed)
PT Cancellation Note  Patient Details Name: [name] MRN: [id number] DOB: 06-10-1940   Cancelled Treatment:    Reason Eval/Treat Not Completed: Medical issues which prohibited therapy. Nurse report pt remains very SOB moving in bed and weaning neo. Will follow up tomorrow.   Shary Decamp [place] 02/10/2017, 9:56 AM [place]

## 2017-02-10 NOTE — Progress Notes (Signed)
Spoke with Dr. Virgina Jock about pt Co-ox this am. Dr. Einar Gip to see pt today. Dr. Einar Gip called and advised will talk with Heart Failure Team and requested to wean Neo gtt. Dr. Einar Gip made aware of SBP,(90's) Cardiac Index and PA pressures. Advised pts sbp in office ranges 80-90's. Will continue

## 2017-02-10 NOTE — Progress Notes (Signed)
Subjective:  Feels weak still and has not sat up in chair. No chest pain, no orthopnea. No complication from heart cath yesterday.  Objective:  Vital Signs in the last 24 hours: Temp:  [95.2 F (35.1 C)-99.5 F (37.5 C)] 98.5 F (36.9 C) (11/07 1227) Pulse Rate:  [0-63] 50 (11/07 1215) Resp:  [0-33] 21 (11/07 1215) BP: (80-121)/(44-107) 105/67 (11/07 1215) SpO2:  [0 %-100 %] 100 % (11/07 1215) Weight:  [84 kg (185 lb 3 oz)] 84 kg (185 lb 3 oz) (11/07 0500)  Intake/Output from previous day: 11/06 0701 - 11/07 0700 In: 2041.9 [P.O.:480; I.V.:1561.9] Out: 272 [Urine:271; Stool:1] Intake/Output from this shift: Total I/O In: -  Out: 100 [Urine:100]  Physical Exam: General appearance: alert, cooperative, appears stated age and no distress Lungs: clear to auscultation bilaterally Heart: S1, S2 normal, no S3 or S4 and systolic murmur: systolic ejection 3/6, harsh at 2nd right intercostal space, at apex Abdomen: soft, non-tender; bowel sounds normal; no masses,  no organomegaly Extremities: extremities normal, atraumatic, no cyanosis or edema Pulses: 2+ and symmetric Neurologic: Grossly normal  Lab Results: Recent Labs    02/08/17 0701 02/10/17 0828  WBC 9.2 11.7*  HGB 9.4* 8.6*  PLT 130* 152   Recent Labs    02/09/17 0523 02/10/17 0425  NA 134* 136  K 3.6 4.0  CL 102 106  CO2 21* 21*  GLUCOSE 125* 118*  BUN 38* 41*  CREATININE 1.40* 1.35*   Imaging: Imaging results have been reviewed  Cardiac Studies: Echo 02/04/2017: Left ventricle: The ventricle is hyperdynamic with what appearsto be obstruction in the outflow tract also. The cavity size wasnormal. Wall thickness was increased in a pattern of moderate tosevere LVH. Systolic function was vigorous. The estimatedejection fraction was in the range of 65% to 70%. Wall motion was normal; there were no regional wall motion abnormalities. - Aortic valve: There is an extremely high gradient across theaortic  valve. It is difficult to tell if this is also fromcontributiion of subaortic stenosis due to outflow tractobstruction or contamination of the jet from a mitralregurgitaiton jet and poor angles. Cusp separation was reduced.Transvalvular velocity was increased. There was severe stenosis.There was moderate regurgitation. - Mitral valve: Moderately calcified annulus. Mildly thickenedleaflets . There was moderate to severe regurgitation. - Left atrium: The atrium was severely dilated. - Right atrium: The atrium was moderately dilated. - Tricuspid valve: There was moderate regurgitation.  Pulmonary arteries: Systolic pressure was severely increased. PApeak pressure: 72 mm Hg (S). - Pulmonic valve: There was moderate regurgitation.  Cardiac catheterization 02/08/2017: No significant obstructive coronary artery disease Patent ostial and mid LAD stents Cardiogenic shock with peak LV apex-Ao gradient 178 mmhg  LV apex 275/9 mmHg, Ao 93/42 mmHg,  eak-to-peak gradient 178 mmhg, pullback shows max gradient in mid cavity and LVOT Aortic valve mean gradient 25 mmhg. PA sats 29% Fick CO 3.1 L/min, CI 1.6 L/min/m2 PA: 76/25, mean 40 mmHg, PW 20 mmHg.  Assessment/Plan:   1.  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  Acute on chronic diastolic heart failure 3.  Chronic atrial fibrillation S/P AV  Nodal ablation and Bi-V pacemaker implantation using His bundle pacing 10/21/16: LV lead turned off yesterday to increase dyssynchrony due to LVOT obstruction. 4.  Chronic stage 3 CKD 5. CAD native vessel without angina. H/P LAD stents in past   Recommendation: Extremely difficult situation, patient on phenylephrine without any improvement in cardiac output and cardiac index and in fact the CO has decreased.  No change in pulmonary pressure, in fact PA pressures have worsened. Patient lives with low BP and has chronically been mildly dyspneic. Recent GI bleed probably led  to decompensation. Clinical presentation and mixed venous O2 sat are not making sense and patient not in multi-organ failure although he is presently in acute decompensated CHF.  Discussed with Dr. Sung Amabile, will start the patient on midodrine, 10 mg 3 times daily, wean off ephedrine. I would also add disopyramide 100 mg p.o. twice daily due to severe LVOT obstruction and HCM. Poor candidate for surgical intervention and septal ablation may not be beneficial as the septal perforator on coronary angiogram is small. Hb trending down and may need transfusion. S. Cr stable.   LOS: 9 days    [name] 02/10/2017, 12:41 PM

## 2017-02-10 NOTE — Progress Notes (Signed)
Discussed case with Cardiology Dr. Einar Gip and they have taken over as Primary after patient's Cardiac Catheterization. Cardiology managing and have patient on Neo-Synephrine. TRH will sign off case at this time given primary issue is the Heart and Dr. Einar Gip to re-consult [place] if needed.

## 2017-02-10 NOTE — Progress Notes (Signed)
Patient transferred from [place] team following for SNF placement if needed when pt medically stable for [place], [place] Social Worker [phone]

## 2017-02-10 NOTE — Progress Notes (Signed)
OT Cancellation Note  Patient Details Name: [name] MRN: [id number] DOB: March 27, 1941   Cancelled Treatment:    Reason Eval/Treat Not Completed: Medical issues which prohibited therapy. Per nursing, pt SOB with movement in bed and is currently weaning neo. OT will check back next date.  Norman Herrlich, MS OTR/L  Pager: [name] 02/10/2017, 10:36 AM

## 2017-02-10 NOTE — Consult Note (Signed)
Advanced Heart Failure Team Consult Note  Primary Cardiologist:  Dr. Einar Gip  Reason for Consultation: HOCM/CHF  HPI:    [name] is seen today for evaluation of HOCM/CHF at the request of Dr. Einar Gip and Dr. Virgina Jock.   [name] is a 76 y.o. male with known history of HOCM with LVOT obstruction, PAF/AFL on Eliquis, s/p AV nodal ablation and BiV pacemaker.   Recently underwent colonoscopy/EGD with blood loss anemia on eliquis. Presented to [facility] 02/01/17 with lightheadedness and heme positive stool. Developed SOB and with HOCM on echo as below, cardiology consulted.  Attempted to diurese patient but limited by soft pressures.  Lactic acid elevated at 2.1 on 02/06/17 after episodes of BP in 70s. Taken for cath as below 02/09/17. HF team consulted with severe HOCM and low cardiac output.   Tenuous. Remains SOB with any exertion.  Has UO ~ 400 for the day so far per RN. Pt irritated with line in his neck, would like it out. Has been struggling with heart issues for "a long time" but acutely worse over past few weeks".  Not very active.   Coox 26.3% this am on phenylephrine 40 (in attempt to decrease dynamic LVOT obstruction). Recheck pending with med adjustments.   Last Swan Numbers (Pulled at 1330 02/10/17) PAP 96/27 (43) CVP 15  Echo 02/04/2017 LVEF 65-70%, Severe LVH/HOCM, Severe AS and Mod AI, complicated by get from MR, Mod/Sev MR, Severe LAE, Mod RAE, Mod TR, Mod PI, PA peak pressure 72 mm/Hg.   [facility] 02/09/17 No significant obstructive CAD RHC Procedural Findings: Hemodynamics (mmHg) RA mean 12 RV 66/0 PA 76/25 (40) PCWP 20 AO 93/42 Cardiac Output (Fick) 3.18 Cardiac Index (Fick) 1.57  Review of Systems: [y] = yes, [ ]  = no   General: Weight gain [ ] ; Weight loss [ ] ; Anorexia [ ] ; Fatigue [y]; Fever [ ] ; Chills [ ] ; Weakness [y]  Cardiac: Chest pain/pressure [ ] ; Resting SOB [y]; Exertional SOB [y]; Orthopnea [ ] ; Pedal Edema [y]; Palpitations [ ] ; Syncope  [ ] ; Presyncope [ ] ; Paroxysmal nocturnal dyspnea[ ]   Pulmonary: Cough [y]; Wheezing[ ] ; Hemoptysis[ ] ; Sputum [ ] ; Snoring [ ]   GI: Vomiting[ ] ; Dysphagia[ ] ; Melena[ ] ; Hematochezia [ ] ; Heartburn[ ] ; Abdominal pain [ ] ; Constipation [ ] ; Diarrhea [ ] ; BRBPR [ ]   GU: Hematuria[ ] ; Dysuria [ ] ; Nocturia[ ]   Vascular: Pain in legs with walking [ ] ; Pain in feet with lying flat [ ] ; Non-healing sores [ ] ; Stroke [ ] ; TIA [ ] ; Slurred speech [ ] ;  Neuro: Headaches[ ] ; Vertigo[ ] ; Seizures[ ] ; Paresthesias[ ] ;Blurred vision [ ] ; Diplopia [ ] ; Vision changes [ ]   Ortho/Skin: Arthritis [y]; Joint pain [y]; Muscle pain [ ] ; Joint swelling [ ] ; Back Pain [ ] ; Rash [ ]   Psych: Depression[ ] ; Anxiety[ ]   Heme: Bleeding problems [ ] ; Clotting disorders [ ] ; Anemia [ ]   Endocrine: Diabetes [ ] ; Thyroid dysfunction[ ]   Home Medications Prior to Admission medications   Medication Sig Start Date End Date Taking? Authorizing Provider  albuterol (PROVENTIL HFA;VENTOLIN HFA) 108 (90 Base) MCG/ACT inhaler Inhale 1-2 puffs into the lungs every 6 (six) hours as needed for wheezing or shortness of breath. 07/07/16  Yes Mabe, Shanon Brow, NP  apixaban (ELIQUIS) 5 MG TABS tablet Take 5 mg by mouth 2 (two) times daily.   Yes [provider]  Ascorbic Acid (VITAMIN C) 500 MG tablet Take 500 mg by mouth 2 (two)  times daily.     Yes [provider]  b complex vitamins tablet Take 1 tablet by mouth daily.   Yes [provider]  Bilberry 1000 MG CAPS Take 1,000 mg by mouth daily.    Yes [provider]  Cholecalciferol (VITAMIN D PO) Take 1 capsule by mouth daily.   Yes [provider]  Coenzyme Q10 (EQL COQ10) 300 MG CAPS Take 1 capsule by mouth every morning.    Yes [provider]  diphenhydrAMINE (BENADRYL) 25 MG tablet Take 25 mg by mouth daily as needed for itching.   Yes [provider]  Ferrous Sulfate (IRON) 325 (65 Fe) MG TABS Take 325 mg by mouth 2 (two)  times daily.    Yes [provider]  furosemide (LASIX) 20 MG tablet Take 1 tablet (20 mg total) by mouth daily. 10/29/16 02/01/17 Yes [place], PA-C  Glucosamine HCl 1000 MG TABS Take 1,000 mg by mouth 2 (two) times daily.    Yes [provider]  HAWTHORN BERRY PO Take 1 capsule by mouth every morning.    Yes [provider]  HORSE CHESTNUT PO Take 1 capsule by mouth daily.   Yes [provider]  hydrocortisone cream 1 % Apply 1 application topically daily as needed for itching.   Yes [provider]  ipratropium (ATROVENT) 0.06 % nasal spray 2 sprays into each nostril 4 times a day as needed for runny nose. Patient taking differently: Place 2 sprays into both nostrils 4 (four) times daily as needed for rhinitis.  07/07/16  Yes Mabe, Shanon Brow, NP  metoprolol succinate (TOPROL-XL) 25 MG 24 hr tablet Take 25 mg by mouth daily.   Yes [provider]  Multiple Vitamin (MULTIVITAMIN WITH MINERALS) TABS Take 1 tablet by mouth every morning.    Yes [provider]  omeprazole (PRILOSEC) 20 MG capsule Take 20 mg by mouth daily. 01/29/17  Yes [provider]  Polyethyl Glycol-Propyl Glycol (SYSTANE) 0.4-0.3 % SOLN Place 1 drop into both eyes 2 (two) times daily.    Yes [provider]  pravastatin (PRAVACHOL) 40 MG tablet Take 40 mg by mouth at bedtime.    Yes [provider]  saw palmetto 160 MG capsule Take 160 mg by mouth 2 (two) times daily.    Yes [provider]  vitamin E 400 UNIT capsule Take 400 Units by mouth daily.    Yes [provider]  VITAMIN K PO Take 1 capsule by mouth daily.   Yes [provider]  imiquimod (ALDARA) 5 % cream Apply 1 application topically at bedtime. 10/13/16   [provider]  triamcinolone cream (KENALOG) 0.1 % Apply 1 application topically 2 (two) times daily. Patient not taking: Reported on 02/01/2017 09/16/16   Vanessa Kick, MD   Past  Medical History: Past Medical History:  Diagnosis Date  . Anemia   . Arthritis    "little in my fingers" (10/21/2016)  . Chronic lower back pain    "last 3 months; usually when I bend" (12/24/2011)  . Coronary artery disease   . Endocarditis   . GERD (gastroesophageal reflux disease)   . Heart murmur   . High cholesterol   . Hypertension   . Migraines    "none in years" (10/21/2016)  . Myocardial infarction [facility])    POSSIBLE 2012 ELEVATED ENZYMES  . Pneumonia ~ 2011  . Presence of permanent cardiac pacemaker    Past Surgical History: Past Surgical History:  Procedure Laterality Date  . APPENDECTOMY    . AV NODE ABLATION  10/21/2016  . BI-VENTRICULAR PACEMAKER INSERTION (CRT-P)  10/21/2016   BiV Pacemaker Insertion CRT-P  . COLON SURGERY    . CORONARY ANGIOPLASTY WITH STENT PLACEMENT  11/11/2010   "1"  . CYSTECTOMY Right ~ 1980    eyelid  . INGUINAL HERNIA REPAIR Bilateral    Family History: Family History  Problem Relation Age of Onset  . Hypertension Mother   . Heart failure Father   . Cancer Neg Hx   . Diabetes Neg Hx   . Hyperlipidemia Neg Hx   . Sudden death Neg Hx   . Stroke Neg Hx   . Heart attack Neg Hx    Social History: Social History   Socioeconomic History  . Marital status: Widowed    Spouse name: None  . Number of children: None  . Years of education: None  . Highest education level: None  Social Needs  . Financial resource strain: None  . Food insecurity - worry: None  . Food insecurity - inability: None  . Transportation needs - medical: None  . Transportation needs - non-medical: None  Occupational History  . None  Tobacco Use  . Smoking status: Never Smoker  . Smokeless tobacco: Never Used  . Tobacco comment: 12/24/2011 "used to puff cigarettes; never inhaled"  Substance and Sexual Activity  . Alcohol use: Yes    Comment: 10/21/2016 "don't drink at all anymore; used to have a beer q 3-4 months"  . Drug use: No  . Sexual activity: No    Other Topics Concern  . None  Social History Narrative  . None   Allergies:  Allergies  Allergen Reactions  . Adhesive [Tape] Itching and Rash    (The patches affected his back, when he had his ablation)  . Brilinta [Ticagrelor] Itching and Rash  . Penicillins Rash    Has patient had a PCN reaction causing immediate rash, facial/tongue/throat swelling, SOB or lightheadedness with hypotension: Yes Has patient had a PCN reaction causing severe rash involving mucus membranes or skin necrosis: Yes Has patient had a PCN reaction that required hospitalization No Has patient had a PCN reaction occurring within the last 10 years: No If all of the above answers are "NO", then may proceed with Cephalosporin use.    Objective:    Vital Signs:   Temp:  [95.2 F (35.1 C)-99.5 F (37.5 C)] 99.5 F (37.5 C) (11/07 1300) Pulse Rate:  [0-54] 51 (11/07 1300) Resp:  [0-33] 22 (11/07 1300) BP: (82-121)/(44-107) 97/62 (11/07 1300) SpO2:  [0 %-100 %] 98 % (11/07 1300) Weight:  [185 lb 3 oz (84 kg)] 185 lb 3 oz (84 kg) (11/07 0500) Last BM Date: 02/09/17  Weight change: Filed Weights   02/07/17 0348 02/08/17 0641 02/10/17 0500  Weight: 175 lb 8 oz (79.6 kg) 177 lb 4.8 oz (80.4 kg) 185 lb 3 oz (84 kg)   Intake/Output:   Intake/Output Summary (Last 24 hours) at 02/10/2017 1449 Last data filed at 02/10/2017 0900 Gross per 24 hour  Intake 2041.9 ml  Output 370 ml  Net 1671.9 ml    Physical Exam    General:  Elderly and chronically ill appearing. Dyspneic with moving around bed HEENT: O2 via [place]. Neck: supple. JVP 9-10 cm. Carotids 2+ bilat; no bruits. No lymphadenopathy or thyromegaly appreciated. Cor: PMI lateral. Regular, slightly brady. Regular rate & rhythm. Harsh 3/6 murmur best heard at RUSB increased with valsalae. 3/6  MR at apex Lungs: Diminished Abdomen: Soft, nontender, nondistended. No hepatosplenomegaly. No bruits or masses. Good bowel sounds. Extremities: no cyanosis,  clubbing, or rash. 1+ edema. L>R.  Neuro: alert & orientedx3, cranial nerves grossly intact. moves all 4 extremities w/o difficulty. Affect  Pleasant.  Telemetry   V paced 50-60s, Personally reviewed.   EKG    V paced 65 bpm, Personally reviewed  Labs   Basic Metabolic Panel: Recent Labs  Lab 02/06/17 0545 02/06/17 1110 02/07/17 0509 02/07/17 1438 02/08/17 0453 02/09/17 0523 02/10/17 0425 02/10/17 0828  NA  --  133* 134*  --  133* 134* 136  --   K  --  3.2* 3.8  --  3.5 3.6 4.0  --   CL  --  103 101  --  102 102 106  --   CO2  --  23 23  --  23 21* 21*  --   GLUCOSE  --  119* 127*  --  113* 125* 118*  --   BUN  --  29* 35*  --  38* 38* 41*  --   CREATININE  --  1.37* 1.54* 1.63* 1.40* 1.40* 1.35*  --   CALCIUM  --  7.8* 8.2*  --  7.9* 8.0* 8.0*  --   MG 1.8  --  1.8  --  2.0 2.1 2.2  --   PHOS  --   --   --   --   --   --   --  3.3   Liver Function Tests: No results for input(s): AST, ALT, ALKPHOS, BILITOT, PROT, ALBUMIN in the last 168 hours. No results for input(s): LIPASE, AMYLASE in the last 168 hours. No results for input(s): AMMONIA in the last 168 hours.  CBC: Recent Labs  Lab 02/04/17 0447 02/05/17 0427 02/06/17 0924 02/08/17 0701 02/10/17 0828  WBC 10.3 10.3 8.9 9.2 11.7*  NEUTROABS  --   --   --   --  8.6*  HGB 8.9* 8.8* 9.0* 9.4* 8.6*  HCT 26.7* 26.6* 27.4* 29.2* 27.9*  MCV 94.3 94.0 94.5 93.6 95.5  PLT 73* 73* 88* 130* 152   Cardiac Enzymes: No results for input(s): CKTOTAL, CKMB, CKMBINDEX, TROPONINI in the last 168 hours.  BNP: BNP (last 3 results) No results for input(s): BNP in the last 8760 hours.  ProBNP (last 3 results) No results for input(s): PROBNP in the last 8760 hours.  CBG: No results for input(s): GLUCAP in the last 168 hours.  Coagulation Studies: Recent Labs    02/08/17 0453  LABPROT 16.3*  INR 1.32   Imaging    No results found.  Medications:    Current Medications: . diltiazem  30 mg Oral Q6H  .  disopyramide  100 mg Oral Q12H  . ipratropium  2 spray Each Nare QID  . midodrine  10 mg Oral TID WC  . pantoprazole  40 mg Oral BID  . sodium chloride flush  3 mL Intravenous Q12H    Infusions: . sodium chloride 250 mL (02/09/17 1900)  . phenylephrine (NEO-SYNEPHRINE) Adult infusion Stopped (02/10/17 0806)     Patient Profile   [name] is a 76 y.o. male with known history of HOCM with LVOT obstruction, PAF/AFL on Eliquis, s/p AV nodal ablation and BiV pacemaker.   Admitted with lightheadedness 2/2 blood loss anemia with GI bleed. Cardiology consulted with worsening SOB. HF team consulted with severe HOCM and cardiogenic shock.   Assessment/Plan   1. Cardiogenic shock in  setting of HOCM - Tenuous. Coox 26% this am. Coox now up to 45% with med adjustments as below.  - Per Dr. Haroldine Laws has been weaned off neo.  - Continue midodrine 10 mg TID - Continue disopyramide 100 mg po BID. - Currently on diltiazem 30 mg q6hr. Will discuss with MD. 2. ABL Anemia - Eliquis on hold - Hgb 8.6 - No further bleeding.  3. Chronic Afib - s/p AV nodal ablation with BiV pacing.  - CHA2DS2/VASc is at least 3 Stroke Risk Points  Pt remains extremely tenuous. Will discuss further with MD.   Length of Stay: [place], [place]  02/10/2017, 2:49 PM  Advanced Heart Failure Team Pager [phone] (M-F; 7a - 4p)  Please contact [facility] for night-coverage after hours (4p -7a ) and weekends on [email or web address]  Patient seen and examined with the above-signed Advanced Practice Provider and/or Housestaff. I personally reviewed laboratory data, imaging studies and relevant notes. I independently examined the patient and formulated the important aspects of the plan. I have edited the note to reflect any of my changes or salient points. I have personally discussed the plan with the patient and/or family.  Cath films, hemodynamics and echo images reviewed personally and discussed with  Dr. Einar Gip, He is extremely tenuous with HOCM and severe LVOT obstruction and severe MR. OVerall very frail recent course complicated by GI bleeding and anemia. Mixed venous sat extremely low in the 20s% (which is the lowest I have ever seen in HOCM patient) yet suprisingly end-organ function intact. We have been able to wean him off neosynephrine by adding midodrine and mixed venous sats now up in the 40s. Disopyramide has also been added (can increase to standard dose of 150 bid, if needed.  Overall options are very limited. Would avoid overdiuresis and keep hgb >= 8.5 or 9.0. Agree that septal perforator anatomy not ideal for ablation. Could consider deactivating LV lead to promote LV dysynchrony and reduce LVOT gradient. The real question can is can we get him well enough to be able to withstand myomectomy +/- MV repair. Given size of LA restoring NSR probably not a worthwhile task to undertake.   We will follow with you.  Glori Bickers, MD  9:28 PM

## 2017-02-11 ENCOUNTER — Inpatient Hospital Stay (HOSPITAL_COMMUNITY): Payer: Medicare Other

## 2017-02-11 LAB — COOXEMETRY PANEL
Carboxyhemoglobin: 1.4 % (ref 0.5–1.5)
Methemoglobin: 1.1 % (ref 0.0–1.5)
O2 Saturation: 40.2 %
Total hemoglobin: 8.6 g/dL — ABNORMAL LOW (ref 12.0–16.0)

## 2017-02-11 LAB — BASIC METABOLIC PANEL
Anion gap: 8 (ref 5–15)
BUN: 36 mg/dL — AB (ref 6–20)
CALCIUM: 7.7 mg/dL — AB (ref 8.9–10.3)
CO2: 22 mmol/L (ref 22–32)
Chloride: 104 mmol/L (ref 101–111)
Creatinine, Ser: 1.43 mg/dL — ABNORMAL HIGH (ref 0.61–1.24)
GFR calc Af Amer: 54 mL/min — ABNORMAL LOW (ref >60)
GFR, EST NON AFRICAN AMERICAN: 47 mL/min — AB (ref >60)
GLUCOSE: 102 mg/dL — AB (ref 65–99)
Potassium: 3.9 mmol/L (ref 3.5–5.1)
Sodium: 134 mmol/L — ABNORMAL LOW (ref 135–145)

## 2017-02-11 LAB — IRON AND TIBC
IRON: 23 ug/dL — AB (ref 45–182)
SATURATION RATIOS: 9 % — AB (ref 17.9–39.5)
TIBC: 251 ug/dL (ref 250–450)
UIBC: 228 ug/dL

## 2017-02-11 LAB — CBC
HEMATOCRIT: 27.6 % — AB (ref 39.0–52.0)
HEMOGLOBIN: 8.7 g/dL — AB (ref 13.0–17.0)
MCH: 30.2 pg (ref 26.0–34.0)
MCHC: 31.5 g/dL (ref 30.0–36.0)
MCV: 95.8 fL (ref 78.0–100.0)
Platelets: 149 10*3/uL — ABNORMAL LOW (ref 150–400)
RBC: 2.88 MIL/uL — AB (ref 4.22–5.81)
RDW: 17.3 % — ABNORMAL HIGH (ref 11.5–15.5)
WBC: 15 10*3/uL — ABNORMAL HIGH (ref 4.0–10.5)

## 2017-02-11 LAB — FERRITIN: Ferritin: 98 ng/mL (ref 24–336)

## 2017-02-11 LAB — RETICULOCYTES
RBC.: 3.01 MIL/uL — ABNORMAL LOW (ref 4.22–5.81)
RETIC COUNT ABSOLUTE: 316.1 10*3/uL — AB (ref 19.0–186.0)
Retic Ct Pct: 10.5 % — ABNORMAL HIGH (ref 0.4–3.1)

## 2017-02-11 LAB — MAGNESIUM: MAGNESIUM: 2.2 mg/dL (ref 1.7–2.4)

## 2017-02-11 LAB — VITAMIN B12: Vitamin B-12: 2200 pg/mL — ABNORMAL HIGH (ref 180–914)

## 2017-02-11 LAB — FOLATE: FOLATE: 33 ng/mL (ref >5.9)

## 2017-02-11 MED ORDER — SODIUM CHLORIDE 0.9 % IV SOLN
510.0000 mg | Freq: Once | INTRAVENOUS | Status: AC
  Administered 2017-02-11: 510 mg via INTRAVENOUS
  Filled 2017-02-11: qty 17

## 2017-02-11 MED ORDER — DILTIAZEM HCL 60 MG PO TABS
60.0000 mg | ORAL_TABLET | Freq: Four times a day (QID) | ORAL | Status: DC
  Administered 2017-02-11 – 2017-02-19 (×30): 60 mg via ORAL
  Filled 2017-02-11 (×29): qty 1

## 2017-02-11 MED ORDER — FUROSEMIDE 10 MG/ML IJ SOLN
20.0000 mg | Freq: Once | INTRAMUSCULAR | Status: AC
  Administered 2017-02-11: 20 mg via INTRAVENOUS
  Filled 2017-02-11: qty 2

## 2017-02-11 NOTE — Progress Notes (Signed)
Physical Therapy Treatment Patient Details Name: [name] MRN: [id number] DOB: 1940-05-30 Today's Date: 02/11/2017    History of Present Illness Pt is 76 y.o. male admitted to hospital with GI bleed, anemia, and hypotension. Pt also with acute on chronic heart failure and transferred to ICU. PMH includes HTN, afib s/p ablation, pacemaker, CAD with stenting, R hemicolectomy.    PT Comments    Pt with decline in function since last seen. Low activity tolerance. Continue to feel that pt will need SNF at dc.   Follow Up Recommendations  SNF     Equipment Recommendations  Other (comment)(To be assessed)    Recommendations for Other Services       Precautions / Restrictions Precautions Precautions: Fall Precaution Comments: watch BP Restrictions Weight Bearing Restrictions: No    Mobility  Bed Mobility Overal bed mobility: Needs Assistance Bed Mobility: Supine to Sit     Supine to sit: Mod assist     General bed mobility comments: Assist to bring legs off bed, elevate trunk into sitting, and bring hips to EOB. Dyspnea 3/4 on RA with SpO2 98%  Transfers Overall transfer level: Needs assistance Equipment used: Rolling walker (2 wheeled) Transfers: Sit to/from [facility] Sit to Stand: Min assist;+2 safety/equipment Stand pivot transfers: Min assist;+2 safety/equipment       General transfer comment: Assist to bring hips up and for balance.  Ambulation/Gait Ambulation/Gait assistance: Min assist;+2 safety/equipment Ambulation Distance (Feet): 2 Feet Assistive device: Rolling walker (2 wheeled) Gait Pattern/deviations: Step-to pattern;Decreased step length - right;Decreased step length - left;Shuffle;Trunk flexed Gait velocity: decr Gait velocity interpretation: Below normal speed for age/gender General Gait Details: Assist for balance and support. Pt with dyspnea 3/4 on RA with SpO2 92%.   Stairs            Wheelchair Mobility     Modified Rankin (Stroke Patients Only)       Balance Overall balance assessment: Needs assistance Sitting-balance support: No upper extremity supported;Feet unsupported Sitting balance-Leahy Scale: Fair     Standing balance support: Bilateral upper extremity supported Standing balance-Leahy Scale: Poor Standing balance comment: walker and min assist for static standing                            Cognition Arousal/Alertness: Awake/alert Behavior During Therapy: WFL for tasks assessed/performed Overall Cognitive Status: Within Functional Limits for tasks assessed                                        Exercises      General Comments        Pertinent Vitals/Pain      Home Living                      Prior Function            PT Goals (current goals can now be found in the care plan section) Acute Rehab PT Goals PT Goal Formulation: With patient Time For Goal Achievement: 02/25/17 Potential to Achieve Goals: Good Progress towards PT goals: Goals downgraded-see care plan    Frequency    Min 2X/week      PT Plan Current plan remains appropriate    Co-evaluation              AM-PAC PT "6 Clicks" Daily Activity  Outcome  Measure  Difficulty turning over in bed (including adjusting bedclothes, sheets and blankets)?: Unable Difficulty moving from lying on back to sitting on the side of the bed? : Unable Difficulty sitting down on and standing up from a chair with arms (e.g., wheelchair, bedside commode, etc,.)?: Unable Help needed moving to and from a bed to chair (including a wheelchair)?: A Little Help needed walking in hospital room?: Total Help needed climbing 3-5 steps with a railing? : Total 6 Click Score: 8    End of Session Equipment Utilized During Treatment: Gait belt Activity Tolerance: Patient limited by fatigue Patient left: with call bell/phone within reach;in chair;with chair alarm set Nurse  Communication: Mobility status PT Visit Diagnosis: Muscle weakness (generalized) (M62.81);Difficulty in walking, not elsewhere classified (R26.2)     Time: 5830-9407 PT Time Calculation (min) (ACUTE ONLY): 20 min  Charges:  $Therapeutic Activity: 8-22 mins                    G Codes:       [facility] PT [place] 02/11/2017, 3:30 PM

## 2017-02-11 NOTE — Progress Notes (Signed)
Subjective:  No lightheadedness Breathing marginally improved Continue to require supplemental oxygen, especially on ambulation.  Blood pressure and PA Sats improved after stopping phenylephrine, starting disopyramide and diltiazem.  Underwent LHC/RHC/Cor angiogram on 02/09/2017 Peak LV-AO gradient >150 mmhg secondary to HOCM Aortic valve mean gradient 25 mmhg  Objective:  Vital Signs in the last 24 hours: Temp:  [97.7 F (36.5 C)-99.5 F (37.5 C)] 98.5 F (36.9 C) (11/08 0328) Pulse Rate:  [45-53] William (11/08 0800) Resp:  [20-31] 23 (11/08 0800) BP: (82-124)/(45-101) 115/57 (11/08 0800) SpO2:  [91 %-100 %] 100 % (11/08 0800) Weight:  [83.1 kg (183 lb 3.2 oz)] 83.1 kg (183 lb 3.2 oz) (11/08 0500)  Intake/Output from previous day: 11/07 0701 - 11/08 0700 In: 7616 [P.O.:950; I.V.:370] Out: 675 [Urine:675] Intake/Output from this shift: Total I/O In: 20 [I.V.:20] Out: -    Net +5.1 L for hospital admission  Physical Exam: Nursing note and vitals reviewed. Constitutional: He is oriented to person, place, and time. He appears well-developed and well-nourished.  HENT:  Head: Normocephalic and atraumatic.  Neck: Rt IJ PA catheter in place Cardiovascular: Normal rate and regular rhythm.  . JVP CVP aprox 15 cm Murmur (Harsh III/VI murmur RUSB) heard. Respiratory: Effort normal. He has rales (Left basal).  Rt radial cath site with no bleeding/hematoma GI: Soft.  Musculoskeletal: He exhibits no edema.  Neurological: He is alert and oriented to person, place, and time.     Lab Results: Recent Labs    02/10/17 0828 02/11/17 0445  WBC 11.7* 15.0*  HGB 8.6* 8.7*  PLT 152 149*   Recent Labs    02/10/17 0425 02/11/17 0445  NA 136 134*  K 4.0 3.9  CL 106 104  CO2 21* 22  GLUCOSE 118* 102*  BUN 41* 36*  CREATININE 1.35* 1.43*    Imaging:   Cardiac Studies: Study Conclusions  - Left ventricle: The ventricle is hyperdynamic with what appears   to be  obstruction in the outflow tract also. The cavity size was   normal. Wall thickness was increased in a pattern of moderate to   severe LVH. Systolic function was vigorous. The estimated   ejection fraction was in the range of 65% to 70%. Wall motion was   normal; there were no regional wall motion abnormalities. - Aortic valve: There is an extremely high gradient across the   aortic valve. It is difficult to tell if this is also from   contributiion of subaortic stenosis due to outflow tract   obstruction or contamination of the jet from a mitral   regurgitaiton jet and poor angles. Cusp separation was reduced.   Transvalvular velocity was increased. There was severe stenosis.   There was moderate regurgitation. - Mitral valve: Moderately calcified annulus. Mildly thickened   leaflets . There was moderate to severe regurgitation. - Left atrium: The atrium was severely dilated. - Right atrium: The atrium was moderately dilated. - Tricuspid valve: There was moderate regurgitation. - Pulmonic valve: There was moderate regurgitation. - Pulmonary arteries: Systolic pressure was severely increased. PA   peak pressure: 72 mm Hg (S).  02/07/2017 EXAM: PORTABLE CHEST 1 VIEW  COMPARISON:  02/05/2017.  FINDINGS: Left-sided triple lead pacer device appears in stable position.  The cardiomediastinal silhouette is enlarged but stable. There is continued pulmonary vascular congestion and edema.  Small bilateral pleural effusions, left greater than right. No pneumothorax.  No acute osseous abnormalities.  IMPRESSION: Grossly stable appearance of the chest demonstrating cardiomegaly, pulmonary edema  and bilateral pleural effusions. Superimposed infiltrate difficult to exclude in this setting.    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 He was admitted on 10/29 with lightheadedness post blood anemia after colonoscopy, and shortness of breath requiring oxygen   1. 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 Moderate valvular aortic stenosis 3. Blood loss anemia 4. Cardiogenic shock: Due to #1LHC/RHC, Cor angiogram 02/09/2017  CI 1.6 L/m2. PA sats 29% 5. Paroxysmal atrial fib/flutter 6. BiV pacemaker placement s/p AV node ablation 7. Mildly increased creatinine, likely pre renal azotemia  Recommendations: Pacemaker reprogramming 02/10/2017: [employer] BiV pacemaker with RA, RV (His), and LV (CS) leads. CS lead turned off to achieve asynchronous pacing Underlying rhythm AF with junctional rate in 60s, post AV nodal ablation Battery life 5 yrs  Continue disopyramide 100 mg bid Increase diltiazem to 60 mg q6hr IV lasix 20 mg today Ambulate  Potentially transfer to telemetry later today.  I discussed the case with heart failure specialist Dr. Haroldine Laws, who also discussed the case with Dr. Jacelyn Grip at [facility]. Ideally, he needs to be evaluated for either myectomy or alcohol septal ablation, but may not be a candidate for either given comorbidities and poor septal perforator target. I have discussed this with his primary cardiologist Dr. Einar Gip and we will discuss with Dr. Jacelyn Grip at [facility].   Agree with holding Eliquis given bleeding risk. His CHA2DS2VASc score is 4, but bleeding risk outweighs the benefits at this time. I've discussed this with the patient and he understands that his risk of stroke. Blood transfusion as necessary for anemia. Currently stable.    LOS: 10 days    [name] J [name] 02/11/2017, 8:13 AM  [place], MD [facility] [place] Pager: [phone] Office: [phone] If no answer Cell [phone]

## 2017-02-11 NOTE — Progress Notes (Signed)
2 Days Post-Op Procedure(s) (LRB): RIGHT/LEFT HEART CATH AND CORONARY ANGIOGRAPHY (N/A) Subjective: Cardiac cath, echocardiogram and right heart cath data personally reviewed and patient examined.  It appears he has moderate aortic stenosis, significant subvalvular outflow tract obstruction 3/6 murmur severe mitral regurgitation and pulmonary hypertension and is in low output heart failure.  He has a severe pectus deformity of his chest and is in atrial fibrillation  He is currently recovering from GI bleed and is very debilitated and does not appear to be a candidate for probable myomectomy with double valve replacement at this time.   Objective: Vital signs in last 24 hours: Temp:  [97.7 F (36.5 C)-98.8 F (37.1 C)] 98.8 F (37.1 C) (11/08 1200) Pulse Rate:  [49-53] 50 (11/08 1700) Cardiac Rhythm: Ventricular paced;Atrial flutter (11/08 1400) Resp:  [18-29] 24 (11/08 1700) BP: (82-138)/(47-99) 94/66 (11/08 1700) SpO2:  [90 %-100 %] 93 % (11/08 1700) Weight:  [183 lb 3.2 oz (83.1 kg)] 183 lb 3.2 oz (83.1 kg) (11/08 0500)  Hemodynamic parameters for last 24 hours: CVP:  [11 mmHg] 11 mmHg  Intake/Output from previous day: 11/07 0701 - 11/08 0700 In: 1340 [P.O.:950; I.V.:370] Out: 675 [Urine:675] Intake/Output this shift: Total I/O In: 460 [P.O.:220; I.V.:123; IV Piggyback:117] Out: 38 [Urine:825]  Exam  Debilitated elderly male sitting in the chair Positive JVD Distant breath sounds 3/6 holosystolic murmur of MR, 3/6 ejection murmur right upper sternal border of aortic stenosis-outflow tract obstruction Mild peripheral edema Lab Results: Recent Labs    02/10/17 0828 02/11/17 0445  WBC 11.7* 15.0*  HGB 8.6* 8.7*  HCT 27.9* 27.6*  PLT 152 149*   BMET:  Recent Labs    02/10/17 0425 02/11/17 0445  NA 136 134*  K 4.0 3.9  CL 106 104  CO2 21* 22  GLUCOSE 118* 102*  BUN 41* 36*  CREATININE 1.35* 1.43*  CALCIUM 8.0* 7.7*    PT/INR: No results for input(s):  LABPROT, INR in the last 72 hours. ABG    Component Value Date/Time   PHART 7.518 (H) 02/09/2017 1409   HCO3 20.7 02/09/2017 1410   TCO2 22 02/09/2017 1410   ACIDBASEDEF 2.0 02/09/2017 1410   O2SAT 40.2 02/11/2017 0445   CBG (last 3)  No results for input(s): GLUCAP in the last 72 hours.  Assessment/Plan: S/P Procedure(s) (LRB): RIGHT/LEFT HEART CATH AND CORONARY ANGIOGRAPHY (N/A) Severe heart failure and low cardiac output Probably has underlying COPD from restriction from the pectus deformity We will follow  LOS: 10 days    Tharon Aquas Trigt III 02/11/2017

## 2017-02-11 NOTE — Plan of Care (Signed)
  Activity: Risk for activity intolerance will decrease 02/11/2017 0859 - Not Progressing by Jenne Campus, RN  Pt has shortness of breath with minimal exertion such as moving/turning in bed

## 2017-02-11 NOTE — Progress Notes (Signed)
Advanced Heart Failure Rounding Note  Primary Cardiologist:  Dr. Einar Gip  Reason for Consultation: HOCM/CHF  Subjective:    Had been feeling somewhat better, but somewhat more SOB this am.   Coox 40% this am. Off phenylephrine. On disopyramide, midodrine, and diltiazem.   Objective:   Weight Range: 183 lb 3.2 oz (83.1 kg) Body mass index is 24.85 kg/m.   Vital Signs:   Temp:  [97.7 F (36.5 C)-99.5 F (37.5 C)] 98.3 F (36.8 C) (11/08 0821) Pulse Rate:  [45-53] 49 (11/08 1000) Resp:  [19-29] 19 (11/08 1000) BP: (82-130)/(49-101) 127/64 (11/08 1000) SpO2:  [91 %-100 %] 96 % (11/08 1000) Weight:  [183 lb 3.2 oz (83.1 kg)] 183 lb 3.2 oz (83.1 kg) (11/08 0500) Last BM Date: 02/09/17  Weight change: Filed Weights   02/08/17 0641 02/10/17 0500 02/11/17 0500  Weight: 177 lb 4.8 oz (80.4 kg) 185 lb 3 oz (84 kg) 183 lb 3.2 oz (83.1 kg)    Intake/Output:   Intake/Output Summary (Last 24 hours) at 02/11/2017 1113 Last data filed at 02/11/2017 1000 Gross per 24 hour  Intake 1058.01 ml  Output 675 ml  Net 383.01 ml      Physical Exam    General:  Elderly and chronically ill appearing. Mild dyspnea.  HEENT: Normal Neck: Supple. JVP 9-10 cm. Carotids 2+ bilat; no bruits. No lymphadenopathy or thyromegaly appreciated. Cor: PMI lateral. Regular, slightly brady. Harsh 3/6 murmur best at RUSB increased with valsalva. 3/6 MR at apex.  Lungs: Diminished throughout.  Abdomen: Soft, nontender, nondistended. No hepatosplenomegaly. No bruits or masses. Good bowel sounds. Extremities: No cyanosis, clubbing, or rash. Trace to 1+ BLE edema.  Neuro: Alert & orientedx3, cranial nerves grossly intact. moves all 4 extremities w/o difficulty. Affect pleasant  Telemetry   V paced 50-60s, underlying Afib. Personally reviewed.   EKG    V paced 65 bpm   Labs    CBC Recent Labs    02/10/17 0828 02/11/17 0445  WBC 11.7* 15.0*  NEUTROABS 8.6*  --   HGB 8.6* 8.7*  HCT 27.9*  27.6*  MCV 95.5 95.8  PLT 152 235*   Basic Metabolic Panel Recent Labs    02/10/17 0425 02/10/17 0828 02/11/17 0445  NA 136  --  134*  K 4.0  --  3.9  CL 106  --  104  CO2 21*  --  22  GLUCOSE 118*  --  102*  BUN 41*  --  36*  CREATININE 1.35*  --  1.43*  CALCIUM 8.0*  --  7.7*  MG 2.2  --  2.2  PHOS  --  3.3  --    Liver Function Tests No results for input(s): AST, ALT, ALKPHOS, BILITOT, PROT, ALBUMIN in the last 72 hours. No results for input(s): LIPASE, AMYLASE in the last 72 hours. Cardiac Enzymes No results for input(s): CKTOTAL, CKMB, CKMBINDEX, TROPONINI in the last 72 hours.  BNP: BNP (last 3 results) No results for input(s): BNP in the last 8760 hours.  ProBNP (last 3 results) No results for input(s): PROBNP in the last 8760 hours.   D-Dimer No results for input(s): DDIMER in the last 72 hours. Hemoglobin A1C No results for input(s): HGBA1C in the last 72 hours. Fasting Lipid Panel No results for input(s): CHOL, HDL, LDLCALC, TRIG, CHOLHDL, LDLDIRECT in the last 72 hours. Thyroid Function Tests No results for input(s): TSH, T4TOTAL, T3FREE, THYROIDAB in the last 72 hours.  Invalid input(s): FREET3  Other results:  Imaging    Dg Chest Port 1 View  Result Date: 02/11/2017 CLINICAL DATA:  Shortness of breath, history of coronary artery disease, pneumonia, endocarditis. EXAM: PORTABLE CHEST 1 VIEW COMPARISON:  Chest x-ray of February 07, 2017 FINDINGS: Or oral wall the cardiac silhouette is mildly enlarged. The pulmonary vascularity is engorged. There are small bilateral pleural effusions. There is patchy alveolar opacity in the right upper lobe. There is increased density at the left lung base. The ICD is in stable position. IMPRESSION: CHF with pulmonary interstitial edema and small bilateral pleural effusions. Confluent airspace opacity now present in the right upper lobe is worrisome for pneumonia. Left lower lobe atelectasis or pneumonia persists.  Electronically Signed   By: David  Martinique M.D.   On: 02/11/2017 08:57      Medications:     Scheduled Medications: . diltiazem  60 mg Oral Q6H  . disopyramide  100 mg Oral Q12H  . ipratropium  2 spray Each Nare QID  . midodrine  10 mg Oral TID WC  . pantoprazole  40 mg Oral BID  . sodium chloride flush  3 mL Intravenous Q12H     Infusions: . sodium chloride 250 mL (02/10/17 1700)  . phenylephrine (NEO-SYNEPHRINE) Adult infusion Stopped (02/10/17 0806)     PRN Medications:  sodium chloride, acetaminophen, albuterol, ondansetron (ZOFRAN) IV, simethicone, sodium chloride flush    Patient Profile   [name] is a 76 y.o. male with known history of HOCM with LVOT obstruction, PAF/AFL on Eliquis, s/p AV nodal ablation and BiV pacemaker.   Admitted with lightheadedness 2/2 blood loss anemia with GI bleed. Cardiology consulted with worsening SOB. HF team consulted with severe HOCM and cardiogenic shock.   Assessment/Plan   1. Cardiogenic shock in setting of HOCM - Remains tenuous. Coox 40% this am. Treatment complicated by HOCM.  - Continue midodrine 10 mg TID - Continue disopyramide 100 mg po BID. - Currently on diltiazem 30 mg q6hr.  - Volume status at least mildly elevated. Given 20 mg IV lasix x 1 this am. Will watch diuretic regimen closely with need to preserve LV filling in setting of HOCM.  2. ABL Anemia - Eliquis on hold - Hgb 8.7 this am.  - No bleeding. Continue to follow closely.  3. Chronic Afib - s/p AV nodal ablation with BiV pacing. No change.  - CHA2DS2/VASc is at least 3   Pt remains very tenuous. Dr. Einar Gip to ask Dr. Prescott Gum to see for surgical consideration. If not, will need to be soon at [facility]. Pt understands his prognosis may be very poor.   Length of Stay: [address]  Annamaria Helling  02/11/2017, 11:13 AM  Advanced Heart Failure Team Pager [phone] (M-F; 7a - 4p)  Please contact [facility] for night-coverage after hours  (4p -7a ) and weekends on [email or web address]  Patient seen and examined with the above-signed Advanced Practice Provider and/or Housestaff. I personally reviewed laboratory data, imaging studies and relevant notes. I independently examined the patient and formulated the important aspects of the plan. I have edited the note to reflect any of my changes or salient points. I have personally discussed the plan with the patient and/or family.  Remains extremely tenuous. Co-ox remains in 45s. Respiratory status a bit worse. BP improved on midodrine and with asynchronous pacing. Will diurese gently. Keep hgb > 8.5. Check iron stores. Long discussion about possible need for myomectomy. Willing to consider surgery if it will help him.  I have  discussed case with Drs. Nils Pyle and [place]. Will plan surgical eval. Will need rehab before surgery to try to optimize him if he is candidate.   Glori Bickers, MD  1:18 PM

## 2017-02-11 NOTE — Care Management Note (Addendum)
Case Management Note  Patient Details  Name: [name] MRN: [id number] Date of Birth: January 25, 1941  Subjective/Objective:  From home alone, presents with GIB, hypertrophic obstructive cardiomyapathy, acute blood loss anemia, afib, co ox is 26.3, weaning epi, he is not a surgical candidate, plan is SNF per pt rec.  CSW following.  NCM spoke with patient to confirm that he is in agreement with SNF placement and he states yes , he is to weak to go home, NCM informed him that will notate.     11/14 [name] RN, BSN - Palliative consulted, patient too debilitated for surgery.  Palliative will meet with other family members to determine code status and ? [place].   11/16 Reynolds, BSN - per palliative note, patient would like to stay full code and go to snf and then try for surgery.                  Action/Plan: NCM will follow along with CSW for dc needs.   Expected Discharge Date:                  Expected Discharge Plan:  Skilled Nursing Facility  In-House Referral:  NA, Clinical Social Work  Discharge planning Services  CM Consult  Post Acute Care Choice:  NA Choice offered to:     DME Arranged:  N/A DME Agency:  NA  HH Arranged:  NA HH Agency:     Status of Service:  In process, will continue to follow  If discussed at Long Length of Stay Meetings, dates discussed:    Additional Comments:  Zenon Mayo, RN 02/11/2017, 4:13 PM

## 2017-02-12 DIAGNOSIS — I4721 Torsades de pointes: Secondary | ICD-10-CM

## 2017-02-12 DIAGNOSIS — I472 Ventricular tachycardia: Secondary | ICD-10-CM

## 2017-02-12 LAB — BASIC METABOLIC PANEL
ANION GAP: 10 (ref 5–15)
Anion gap: 9 (ref 5–15)
BUN: 36 mg/dL — AB (ref 6–20)
BUN: 37 mg/dL — ABNORMAL HIGH (ref 6–20)
CHLORIDE: 102 mmol/L (ref 101–111)
CO2: 22 mmol/L (ref 22–32)
CO2: 20 mmol/L — AB (ref 22–32)
CREATININE: 1.54 mg/dL — AB (ref 0.61–1.24)
Calcium: 8 mg/dL — ABNORMAL LOW (ref 8.9–10.3)
Calcium: 7.9 mg/dL — ABNORMAL LOW (ref 8.9–10.3)
Chloride: 102 mmol/L (ref 101–111)
Creatinine, Ser: 1.43 mg/dL — ABNORMAL HIGH (ref 0.61–1.24)
GFR calc Af Amer: 54 mL/min — ABNORMAL LOW (ref >60)
GFR calc non Af Amer: 43 mL/min — ABNORMAL LOW (ref >60)
GFR, EST AFRICAN AMERICAN: 50 mL/min — AB (ref >60)
GFR, EST NON AFRICAN AMERICAN: 47 mL/min — AB (ref >60)
GLUCOSE: 91 mg/dL (ref 65–99)
Glucose, Bld: 155 mg/dL — ABNORMAL HIGH (ref 65–99)
POTASSIUM: 3.7 mmol/L (ref 3.5–5.1)
POTASSIUM: 3.7 mmol/L (ref 3.5–5.1)
SODIUM: 132 mmol/L — AB (ref 135–145)
Sodium: 133 mmol/L — ABNORMAL LOW (ref 135–145)

## 2017-02-12 LAB — MAGNESIUM
MAGNESIUM: 2.3 mg/dL (ref 1.7–2.4)
MAGNESIUM: 2.4 mg/dL (ref 1.7–2.4)

## 2017-02-12 LAB — URINALYSIS, ROUTINE W REFLEX MICROSCOPIC
Bilirubin Urine: NEGATIVE
Glucose, UA: NEGATIVE mg/dL
Hgb urine dipstick: NEGATIVE
Ketones, ur: NEGATIVE mg/dL
LEUKOCYTES UA: NEGATIVE
NITRITE: NEGATIVE
Protein, ur: NEGATIVE mg/dL
SPECIFIC GRAVITY, URINE: 1.021 (ref 1.005–1.030)
pH: 5 (ref 5.0–8.0)

## 2017-02-12 LAB — CBC
HEMATOCRIT: 29 % — AB (ref 39.0–52.0)
HEMOGLOBIN: 8.9 g/dL — AB (ref 13.0–17.0)
MCH: 29.4 pg (ref 26.0–34.0)
MCHC: 30.7 g/dL (ref 30.0–36.0)
MCV: 95.7 fL (ref 78.0–100.0)
Platelets: 162 10*3/uL (ref 150–400)
RBC: 3.03 MIL/uL — AB (ref 4.22–5.81)
RDW: 17.8 % — ABNORMAL HIGH (ref 11.5–15.5)
WBC: 14.8 10*3/uL — ABNORMAL HIGH (ref 4.0–10.5)

## 2017-02-12 LAB — COOXEMETRY PANEL
CARBOXYHEMOGLOBIN: 1.4 % (ref 0.5–1.5)
METHEMOGLOBIN: 1.6 % — AB (ref 0.0–1.5)
O2 Saturation: 35.7 %
Total hemoglobin: 9.1 g/dL — ABNORMAL LOW (ref 12.0–16.0)

## 2017-02-12 MED ORDER — DISOPYRAMIDE PHOSPHATE 100 MG PO CAPS
200.0000 mg | ORAL_CAPSULE | Freq: Once | ORAL | Status: DC
  Filled 2017-02-12: qty 2

## 2017-02-12 MED ORDER — POTASSIUM CHLORIDE 10 MEQ/100ML IV SOLN
10.0000 meq | INTRAVENOUS | Status: AC
  Administered 2017-02-12 (×4): 10 meq via INTRAVENOUS
  Filled 2017-02-12 (×4): qty 100

## 2017-02-12 MED ORDER — AMIODARONE HCL IN DEXTROSE 360-4.14 MG/200ML-% IV SOLN: 30.0000 mg/h | INTRAVENOUS | Status: DC

## 2017-02-12 MED ORDER — AMIODARONE LOAD VIA INFUSION
150.0000 mg | Freq: Once | INTRAVENOUS | Status: AC
  Administered 2017-02-12: 150 mg via INTRAVENOUS
  Filled 2017-02-12: qty 83.34

## 2017-02-12 MED ORDER — DISOPYRAMIDE PHOSPHATE ER 100 MG PO CP12
200.0000 mg | ORAL_CAPSULE | Freq: Once | ORAL | Status: DC
Start: 2017-02-12 — End: 2017-02-12

## 2017-02-12 MED ORDER — MAGNESIUM SULFATE 2 GM/50ML IV SOLN
2.0000 g | INTRAVENOUS | Status: AC
  Administered 2017-02-12: 2 g via INTRAVENOUS
  Filled 2017-02-12: qty 50

## 2017-02-12 MED ORDER — AMIODARONE HCL IN DEXTROSE 360-4.14 MG/200ML-% IV SOLN
60.0000 mg/h | INTRAVENOUS | Status: DC
  Administered 2017-02-12: 60 mg/h via INTRAVENOUS
  Filled 2017-02-12: qty 200

## 2017-02-12 MED ORDER — DISOPYRAMIDE PHOSPHATE ER 100 MG PO CP12
100.0000 mg | ORAL_CAPSULE | Freq: Once | ORAL | Status: AC
  Administered 2017-02-12: 100 mg via ORAL
  Filled 2017-02-12 (×3): qty 1

## 2017-02-12 MED ORDER — DOCUSATE SODIUM 100 MG PO CAPS
100.0000 mg | ORAL_CAPSULE | Freq: Every day | ORAL | Status: DC
  Administered 2017-02-12 – 2017-02-25 (×13): 100 mg via ORAL
  Filled 2017-02-12 (×15): qty 1

## 2017-02-12 MED ORDER — DISOPYRAMIDE PHOSPHATE 100 MG PO CAPS
100.0000 mg | ORAL_CAPSULE | Freq: Once | ORAL | Status: DC
Start: 2017-02-12 — End: 2017-02-12
  Filled 2017-02-12: qty 1

## 2017-02-12 MED ORDER — DISOPYRAMIDE PHOSPHATE ER 100 MG PO CP12: 200.0000 mg | ORAL_CAPSULE | Freq: Two times a day (BID) | ORAL | Status: DC

## 2017-02-12 NOTE — Progress Notes (Signed)
Pt in torsades no pulse went  unresponsive witnessed by RN shocked x1. Paced rhythm after shock and pt regained conscious. Pt able to follow commands post shock. Son Burt at bedside. Jonni Sanger PA at bedside along with Dr. Virgina Jock.

## 2017-02-12 NOTE — Progress Notes (Signed)
Advanced Heart Failure Rounding Note  Primary Cardiologist:  Dr. Einar Gip  Reason for Consultation: HOCM/CHF  Subjective:    Seen by Dr. Prescott Gum 02/11/17. Too debilitated for surgery at this time.   Feeling OK. Breathing slightly improved. Wants line out of neck. Denies lightheadedness with transitioning to chair, but gets winded very easily.   Coox 35.7% this am. Off phenylephrine. On disopyramide, midodrine, and diltiazem.   Negative 200 cc with one dose IV lasix. Up 3 lbs. CVP 6-7 cm.  Objective:   Weight Range: 186 lb 15.2 oz (84.8 kg) Body mass index is 25.35 kg/m.   Vital Signs:   Temp:  [97.7 F (36.5 C)-98.8 F (37.1 C)] 98.7 F (37.1 C) (11/09 0729) Pulse Rate:  [49-51] 50 (11/09 0800) Resp:  [16-27] 22 (11/09 0800) BP: (93-138)/(47-69) 97/55 (11/09 0800) SpO2:  [90 %-100 %] 100 % (11/09 0800) Weight:  [186 lb 15.2 oz (84.8 kg)] 186 lb 15.2 oz (84.8 kg) (11/09 0404) Last BM Date: 02/09/17  Weight change: Filed Weights   02/10/17 0500 02/11/17 0500 02/12/17 0404  Weight: 185 lb 3 oz (84 kg) 183 lb 3.2 oz (83.1 kg) 186 lb 15.2 oz (84.8 kg)   Intake/Output:   Intake/Output Summary (Last 24 hours) at 02/12/2017 0821 Last data filed at 02/12/2017 0800 Gross per 24 hour  Intake 890 ml  Output 1025 ml  Net -135 ml    Physical Exam    CVP 6-7 cm  General: Elderly and chronically ill appearing. Mild dyspnea.  HEENT: Normal Neck: Supple. JVP 6-7 cm Carotids 2+ bilat; no bruits. No thyromegaly or nodule noted. Cor: PMI nondisplaced. Regular, slightly brady. Harsh 3/6 murmur best at RUSB increased with Valsalva. 3/6 MR at apex. Lungs: CTAB, normal effort. + Pectus excavatum Abdomen: Soft, non-tender, non-distended, no HSM. No bruits or masses. +BS  Extremities: No cyanosis, clubbing, or rash. Trace to 1+ BLE edema.  Neuro: Alert & orientedx3, cranial nerves grossly intact. moves all 4 extremities w/o difficulty. Affect pleasant   Telemetry   V paced  50s, underlying Afib, Personally reviewed.   EKG    V paced 65 bpm   Labs    CBC Recent Labs    02/10/17 0828 02/11/17 0445 02/12/17 0407  WBC 11.7* 15.0* 14.8*  NEUTROABS 8.6*  --   --   HGB 8.6* 8.7* 8.9*  HCT 27.9* 27.6* 29.0*  MCV 95.5 95.8 95.7  PLT 152 149* 027   Basic Metabolic Panel Recent Labs    02/10/17 0828 02/11/17 0445 02/12/17 0407  NA  --  134* 133*  K  --  3.9 3.7  CL  --  104 102  CO2  --  22 22  GLUCOSE  --  102* 91  BUN  --  36* 36*  CREATININE  --  1.43* 1.43*  CALCIUM  --  7.7* 8.0*  MG  --  2.2 2.3  PHOS 3.3  --   --    Liver Function Tests No results for input(s): AST, ALT, ALKPHOS, BILITOT, PROT, ALBUMIN in the last 72 hours. No results for input(s): LIPASE, AMYLASE in the last 72 hours. Cardiac Enzymes No results for input(s): CKTOTAL, CKMB, CKMBINDEX, TROPONINI in the last 72 hours.  BNP: BNP (last 3 results) No results for input(s): BNP in the last 8760 hours.  ProBNP (last 3 results) No results for input(s): PROBNP in the last 8760 hours.  D-Dimer No results for input(s): DDIMER in the last 72 hours. Hemoglobin A1C  No results for input(s): HGBA1C in the last 72 hours. Fasting Lipid Panel No results for input(s): CHOL, HDL, LDLCALC, TRIG, CHOLHDL, LDLDIRECT in the last 72 hours. Thyroid Function Tests No results for input(s): TSH, T4TOTAL, T3FREE, THYROIDAB in the last 72 hours.  Invalid input(s): FREET3  Other results:   Imaging   Dg Chest Port 1 View  Result Date: 02/11/2017 CLINICAL DATA:  Shortness of breath, history of coronary artery disease, pneumonia, endocarditis. EXAM: PORTABLE CHEST 1 VIEW COMPARISON:  Chest x-ray of February 07, 2017 FINDINGS: Or oral wall the cardiac silhouette is mildly enlarged. The pulmonary vascularity is engorged. There are small bilateral pleural effusions. There is patchy alveolar opacity in the right upper lobe. There is increased density at the left lung base. The ICD is in stable  position. IMPRESSION: CHF with pulmonary interstitial edema and small bilateral pleural effusions. Confluent airspace opacity now present in the right upper lobe is worrisome for pneumonia. Left lower lobe atelectasis or pneumonia persists. Electronically Signed   By: David  Martinique M.D.   On: 02/11/2017 08:57    Medications:    Scheduled Medications: . diltiazem  60 mg Oral Q6H  . disopyramide  100 mg Oral Q12H  . ipratropium  2 spray Each Nare QID  . midodrine  10 mg Oral TID WC  . pantoprazole  40 mg Oral BID  . sodium chloride flush  3 mL Intravenous Q12H    Infusions: . sodium chloride 250 mL (02/11/17 2000)  . phenylephrine (NEO-SYNEPHRINE) Adult infusion Stopped (02/10/17 0806)    PRN Medications: sodium chloride, acetaminophen, albuterol, ondansetron (ZOFRAN) IV, simethicone, sodium chloride flush  Patient Profile   [name] is a 76 y.o. male with known history of HOCM with LVOT obstruction, PAF/AFL on Eliquis, s/p AV nodal ablation and BiV pacemaker.   Admitted with lightheadedness 2/2 blood loss anemia with GI bleed. Cardiology consulted with worsening SOB. HF team consulted with severe HOCM and cardiogenic shock.   Assessment/Plan   1. Cardiogenic shock in setting of HOCM - Remains tenuous. Coox 35.7%. Treatment complicated by HOCM.  - Volume status improved. CVP 6-7.  - Legs remain swollen. Will place ted hose.  - Continue midodrine 10 mg TID - Continue disopyramide 100 mg po BID. - Currently on diltiazem 30 mg q6hr.  2. ABL Anemia  - Eliquis on hold - Hgb 8.9 this am.  - No bleeding. Continue to follow closely.  - Iron stores low. Given feraheme 02/11/17. - No BM for 3-4 days.  Will order bowel regimen. Discussed personally with pharmacy.  3. Chronic Afib - s/p AV nodal ablation with BiV pacing. No change.   - CHA2DS2/VASc is at least 4  Pt remains tenuous.  Central access per primary team. Too debilitated for surgery at this point per Dr. Prescott Gum. May eventually need to be seen at [facility]. Prognosis relatively poor in absence of surgery.  Length of Stay: [place], [place]  02/12/2017, 8:21 AM  Advanced Heart Failure Team Pager [phone] (M-F; 7a - 4p)  Please contact [facility] for night-coverage after hours (4p -7a ) and weekends on [email or web address]  Patient seen and examined with the above-signed Advanced Practice Provider and/or Housestaff. I personally reviewed laboratory data, imaging studies and relevant notes. I independently examined the patient and formulated the important aspects of the plan. I have edited the note to reflect any of my changes or salient points. I have personally discussed the plan with the patient and/or  family.  Remains very weak and debilitated. Co-ox falling again. Breathing is better. Hgb stable around 9. Has been seen by Dr. Prescott Gum. Not currently candidate for surgery.   Long talk with him and his son about situation. Also d/w Dr. Einar Gip. Currently too debilitated for surgery. I reiterated to him that his anatomy is not favorable for septal ablation and that surgery is only hope for improvement but I felt that he may never have the reserve to make it to surgery. Quoted him 15-20% chance of improving enough for [place] to reconsider. He says he is motivated to get better and will try.   Agree with current meds. Can take central line out and transfer to floow. Continue PT. Hopefully can go to SNF soon and begin rehab.   William Bickers, MD  12:30 PM

## 2017-02-12 NOTE — Progress Notes (Signed)
Code Blue. Son was with patient and sudden change made for code blue.  Patient was brought back. After care team dispersed, visited patient and son [name].  Had prayer.   Conard Novak, Chaplain   02/12/17 1300  Clinical Encounter Type  Visited With Patient and family together  Visit Type Initial;Code  Referral From Other (Comment)  Consult/Referral To Chaplain  Spiritual Encounters  Spiritual Needs Prayer;Emotional  Stress Factors  Patient Stress Factors Other (Comment)  Family Stress Factors Other (Comment)

## 2017-02-12 NOTE — Progress Notes (Signed)
  Paged for repeat torsades. Lasted < 1 minutes and patient able to "cough" and return to V paced rhythm.    Per Dr. Haroldine Laws start on IV amiodarone for now. Continue to watch closely. Pt very tenuous.    Legrand Como [address] [place], PA-C 02/12/2017 1:43 PM

## 2017-02-12 NOTE — Plan of Care (Signed)
  Fluid Volume: Ability to maintain a balanced intake and output will improve 02/12/2017 0955 - Progressing by Jenne Campus, RN Nutrition: Adequate nutrition will be maintained 02/12/2017 0955 - Progressing by Jenne Campus, RN     Pt appetite and PO intake improving daily one dose lasix given yesterday urine output improved Activity: Risk for activity intolerance will decrease 02/12/2017 0955 - Not Progressing by Jenne Campus, RN  Pt refused to get out of bed states SOB

## 2017-02-12 NOTE — Progress Notes (Addendum)
Episodes of torsades noted. Most likely immediate cause is extra dose of disopyramide of 100 mg this morning. Disopyramide discontinued. Will replace Mag. Will touch base with Dr. [name]. Patient will continue to stay in 2 heart.  Nigel Mormon, MD [facility]. [place] Pager: [phone] Office: [phone] If no answer Cell [phone]

## 2017-02-12 NOTE — Progress Notes (Signed)
Subjective:  No lightheadedness Breathing marginally improved  Dysuria overnight Leukocytosis without fever.  Underwent LHC/RHC/Cor angiogram on 02/09/2017 Peak LV-AO gradient >150 mmhg secondary to HOCM Aortic valve mean gradient 25 mmhg  Objective:  Vital Signs in the last 24 hours: Temp:  [97.7 F (36.5 C)-98.8 F (37.1 C)] 98.7 F (37.1 C) (11/09 0729) Pulse Rate:  [49-52] 52 (11/09 0900) Resp:  [16-27] 20 (11/09 0900) BP: (93-138)/(47-69) 98/55 (11/09 0900) SpO2:  [90 %-100 %] 96 % (11/09 0900) Weight:  [84.8 kg (186 lb 15.2 oz)] 84.8 kg (186 lb 15.2 oz) (11/09 0404)  Intake/Output from previous day: 11/08 0701 - 11/09 0700 In: 900 [P.O.:460; I.V.:323; IV Piggyback:117] Out: 1125 [Urine:1125] Intake/Output from this shift: Total I/O In: 143 [P.O.:120; I.V.:23] Out: 100 [Urine:100]   Net +4.9 L for hospital admission  Physical Exam: Nursing note and vitals reviewed. Constitutional: He is oriented to person, place, and time. He appears well-developed and well-nourished.  HENT:  Head: Normocephalic and atraumatic.  Neck: Rt IJ PA catheter in place Cardiovascular: Normal rate and regular rhythm.  . JVP CVP aprox 15 cm Murmur (Harsh III/VI murmur RUSB) heard. Respiratory: Effort normal. He has rales (Left basal).  Rt radial cath site with no bleeding/hematoma GI: Soft.  Musculoskeletal: He exhibits no edema.  Neurological: He is alert and oriented to person, place, and time.     Lab Results: Recent Labs    02/11/17 0445 02/12/17 0407  WBC 15.0* 14.8*  HGB 8.7* 8.9*  PLT 149* 162   Recent Labs    02/11/17 0445 02/12/17 0407  NA 134* 133*  K 3.9 3.7  CL 104 102  CO2 22 22  GLUCOSE 102* 91  BUN 36* 36*  CREATININE 1.43* 1.43*    Imaging:   Cardiac Studies: Study Conclusions  - Left ventricle: The ventricle is hyperdynamic with what appears   to be obstruction in the outflow tract also. The cavity size was   normal. Wall thickness was  increased in a pattern of moderate to   severe LVH. Systolic function was vigorous. The estimated   ejection fraction was in the range of 65% to 70%. Wall motion was   normal; there were no regional wall motion abnormalities. - Aortic valve: There is an extremely high gradient across the   aortic valve. It is difficult to tell if this is also from   contributiion of subaortic stenosis due to outflow tract   obstruction or contamination of the jet from a mitral   regurgitaiton jet and poor angles. Cusp separation was reduced.   Transvalvular velocity was increased. There was severe stenosis.   There was moderate regurgitation. - Mitral valve: Moderately calcified annulus. Mildly thickened   leaflets . There was moderate to severe regurgitation. - Left atrium: The atrium was severely dilated. - Right atrium: The atrium was moderately dilated. - Tricuspid valve: There was moderate regurgitation. - Pulmonic valve: There was moderate regurgitation. - Pulmonary arteries: Systolic pressure was severely increased. PA   peak pressure: 72 mm Hg (S).  02/07/2017 EXAM: PORTABLE CHEST 1 VIEW  COMPARISON:  02/05/2017.  FINDINGS: Left-sided triple lead pacer device appears in stable position.  The cardiomediastinal silhouette is enlarged but stable. There is continued pulmonary vascular congestion and edema.  Small bilateral pleural effusions, left greater than right. No pneumothorax.  No acute osseous abnormalities.  IMPRESSION: Grossly stable appearance of the chest demonstrating cardiomegaly, pulmonary edema and bilateral pleural effusions. Superimposed infiltrate difficult to exclude in this setting.  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 He was admitted on 10/29 with lightheadedness post blood anemia after colonoscopy, and shortness of breath requiring oxygen   1. 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 Moderate valvular aortic stenosis 3. Blood loss anemia 4. Cardiogenic shock: Due to #1LHC/RHC, Cor angiogram 02/09/2017  CI 1.6 L/m2. PA sats 29% PA sats incongruent with overall clinical status with intact mentation, improving blood pressure, urine output, and negative lactic acid. 5. Paroxysmal atrial fib/flutter 6. BiV pacemaker placement s/p AV node ablation 7. Mildly increased creatinine, likely pre renal azotemia 8. Mild leukocytosis. Possible sources include UTI and pneumonia. No fevers.  Recommendations: Pacemaker reprogramming 02/10/2017: Medtronic BiV pacemaker with RA, RV (His), and LV (CS) leads. CS lead turned off to achieve asynchronous pacing Underlying rhythm AF with junctional rate in 60s, post AV nodal ablation Battery life 5 yrs  Agree with holding Eliquis given bleeding risk. His CHA2DS2VASc score is 4, but bleeding risk outweighs the benefits at this time. I've discussed this with the patient and he understands that his risk of stroke. Blood transfusion as necessary for anemia. Currently stable.   Increase disopyramide to 200 mg bid Continue diltiazem to 60 mg q6hr, can switch to 240 mg qd tomorrow Ambulate  Ongoing medical issues include management of possible UTI, pneumonia, and continue medical therapy for HCM. Not a surgical candidate. Could consider outpatient referral to [facility] once he recovers from this hospital stay. He may need rehab placement.   Will discuss regarding transfer to hospitalist service today.   LOS: 11 days    Azizah Lisle J Storm Dulski 02/12/2017, 9:59 AM  [place], MD [facility] Pager: [phone] Office: [phone] If no answer Cell [phone]

## 2017-02-12 NOTE — Progress Notes (Signed)
Pt had run of torsades at 1330. Strip printed. Jonni Sanger PA with HF and Dr.  Virgina Jock called and made aware. New order obtained will continue to monitor.

## 2017-02-12 NOTE — Progress Notes (Signed)
   Patient with 2 episodes of torsades after receiving increased dose of disopyramide.  Had brief CPR (~ 1 minute) followed by defibrillation and immediate ROSC. Electrolytes ok.   Discussed with PharmD and will discontinue disopyramide as this likely precipitated torsades.  Will keep in ICU today for ongoing monitoring. Can stop amiodarone.   D/w Dr. Virgina Jock.  35 minutes of CCM time  Glori Bickers, MD  3:02 PM

## 2017-02-12 NOTE — Progress Notes (Signed)
OT Cancellation Note  Patient Details Name: KSHAWN CANAL MRN: [id number] DOB: 22-Nov-1940   Cancelled Treatment:    Reason Eval/Treat Not Completed: Medical issues which prohibited therapy.  Event noted.  Will defer OT eval/activity this afternoon, and reattempt as appropriate.  [place], OTR/L [phone]   Lucille Passy M 02/12/2017, 1:59 PM

## 2017-02-12 NOTE — Progress Notes (Signed)
Paged for VF arrest.   At my arrival to the room pt was awake and alert and on O2 via NRB  Per nurse pt son had run out of room stating his father had lost consciousness. Nurse came in and immediately started CPR.  Code cart wheeled to room, and prior to any ACLS meds delivered pt had ROSC.   Tele with Torsades.  EKG post event relatively unremarkable. ? Slight TWI in inferior leads compared to previous.    Repeat labs and follow electrolytes. No role for amio at this point. Keep in ICU.    All above discussed with MD.    Beryle Beams" [place], PA-C 02/12/2017 1:32 PM

## 2017-02-13 ENCOUNTER — Inpatient Hospital Stay (HOSPITAL_COMMUNITY): Payer: Medicare Other

## 2017-02-13 DIAGNOSIS — R0600 Dyspnea, unspecified: Secondary | ICD-10-CM

## 2017-02-13 DIAGNOSIS — I482 Chronic atrial fibrillation: Secondary | ICD-10-CM

## 2017-02-13 LAB — BASIC METABOLIC PANEL
Anion gap: 4 — ABNORMAL LOW (ref 5–15)
BUN: 29 mg/dL — AB (ref 6–20)
CALCIUM: 7.1 mg/dL — AB (ref 8.9–10.3)
CHLORIDE: 107 mmol/L (ref 101–111)
CO2: 22 mmol/L (ref 22–32)
CREATININE: 1.22 mg/dL (ref 0.61–1.24)
GFR, EST NON AFRICAN AMERICAN: 57 mL/min — AB (ref >60)
Glucose, Bld: 85 mg/dL (ref 65–99)
Potassium: 3.6 mmol/L (ref 3.5–5.1)
SODIUM: 133 mmol/L — AB (ref 135–145)

## 2017-02-13 LAB — CBC
HEMATOCRIT: 26.9 % — AB (ref 39.0–52.0)
Hemoglobin: 8.4 g/dL — ABNORMAL LOW (ref 13.0–17.0)
MCH: 30 pg (ref 26.0–34.0)
MCHC: 31.2 g/dL (ref 30.0–36.0)
MCV: 96.1 fL (ref 78.0–100.0)
PLATELETS: 141 10*3/uL — AB (ref 150–400)
RBC: 2.8 MIL/uL — ABNORMAL LOW (ref 4.22–5.81)
RDW: 18 % — AB (ref 11.5–15.5)
WBC: 13.6 10*3/uL — ABNORMAL HIGH (ref 4.0–10.5)

## 2017-02-13 LAB — ECHOCARDIOGRAM LIMITED
AO mean calculated velocity dopler: 521 cm/s
AOVTI: 176 cm
AV pk vel: 695 cm/s
AVG: 123 mmHg
AVPG: 193 mmHg
Height: 72 in
P 1/2 time: 314 ms
Weight: 3022.95 oz

## 2017-02-13 LAB — COOXEMETRY PANEL
Carboxyhemoglobin: 1.3 % (ref 0.5–1.5)
Methemoglobin: 1.4 % (ref 0.0–1.5)
O2 SAT: 38.5 %
TOTAL HEMOGLOBIN: 8.6 g/dL — AB (ref 12.0–16.0)

## 2017-02-13 LAB — MAGNESIUM: MAGNESIUM: 2.4 mg/dL (ref 1.7–2.4)

## 2017-02-13 MED ORDER — MIDODRINE HCL 5 MG PO TABS
15.0000 mg | ORAL_TABLET | Freq: Three times a day (TID) | ORAL | Status: DC
  Administered 2017-02-13 – 2017-02-26 (×40): 15 mg via ORAL
  Filled 2017-02-13 (×41): qty 3

## 2017-02-13 MED ORDER — METOPROLOL TARTRATE 12.5 MG HALF TABLET
12.5000 mg | ORAL_TABLET | Freq: Four times a day (QID) | ORAL | Status: DC
  Administered 2017-02-13 – 2017-02-18 (×20): 12.5 mg via ORAL
  Filled 2017-02-13 (×21): qty 1

## 2017-02-13 MED ORDER — POTASSIUM CHLORIDE CRYS ER 20 MEQ PO TBCR
40.0000 meq | EXTENDED_RELEASE_TABLET | Freq: Once | ORAL | Status: AC
  Administered 2017-02-13: 40 meq via ORAL
  Filled 2017-02-13: qty 2

## 2017-02-13 NOTE — Progress Notes (Addendum)
Patient ID: [name], male   DOB: 05/20/1940, 76 y.o.   MRN: [id number]     Advanced Heart Failure Rounding Note  Primary Cardiologist:  Dr. Einar Gip  Reason for Consultation: HOCM/CHF  Subjective:    Initially on phenylephrine, now stopped. Seen by Dr. Prescott Gum 02/11/17. Too debilitated for surgery at this time.  11/9 had torsades requiring defibrillation and brief CPR.  Disopyramide stopped as likely precipitating torsades.   Short of breath with any exertion.  Comfortable in bed.  Sore chest post-CPR.   Coox still low at 39% this am. Off phenylephrine. Currently on midodrine + diltiazem.   CVP 7 cm.   L/RHC: RA mean 12 PA 76/25 PCWP mean 20  CI 1.6 Mean aortic valve gradient 25 mmHg Peak to peak LV apex-aorta gradient 178 mmHg  Echo: LV EF 65-70%, moderate to severe LVH with LVOT/AoV gradient (very high gradient across aortic valve, likely contribution from moderate LVH and severe LVOT gradient).  Moderate AS and moderate AI.  Moderate to severe MR.  Moderate TR.  PASP 72 mmHg.   Objective:   Weight Range: 188 lb 15 oz (85.7 kg) Body mass index is 25.62 kg/m.   Vital Signs:   Temp:  [97.8 F (36.6 C)-98.3 F (36.8 C)] 97.9 F (36.6 C) (11/10 0800) Pulse Rate:  [49-92] 65 (11/10 0700) Resp:  [17-30] 20 (11/10 0700) BP: (77-131)/(53-77) 98/56 (11/10 0700) SpO2:  [84 %-100 %] 95 % (11/10 0700) Weight:  [188 lb 15 oz (85.7 kg)] 188 lb 15 oz (85.7 kg) (11/10 0800) Last BM Date: 02/09/17  Weight change: Filed Weights   02/11/17 0500 02/12/17 0404 02/13/17 0800  Weight: 183 lb 3.2 oz (83.1 kg) 186 lb 15.2 oz (84.8 kg) 188 lb 15 oz (85.7 kg)   Intake/Output:   Intake/Output Summary (Last 24 hours) at 02/13/2017 0835 Last data filed at 02/13/2017 0700 Gross per 24 hour  Intake 949 ml  Output 600 ml  Net 349 ml    Physical Exam    CVP 7 cm General: NAD Neck: No JVD, no thyromegaly or thyroid nodule.  Lungs: Clear to auscultation bilaterally with  normal respiratory effort. CV: Nondisplaced PMI.  Heart regular S1/S2, no F0/X3, 3/6 systolic murmur across precordium.  1+ ankle edema.   Abdomen: Soft, nontender, no hepatosplenomegaly, no distention.  Skin: Intact without lesions or rashes.  Neurologic: Alert and oriented x 3.  Psych: Normal affect. Extremities: No clubbing or cyanosis.  HEENT: Normal.    Telemetry   V-paced in 50s, personally reviewed.  Underlying atrial fibrillation.    Labs    CBC Recent Labs    02/12/17 0407 02/13/17 0305  WBC 14.8* 13.6*  HGB 8.9* 8.4*  HCT 29.0* 26.9*  MCV 95.7 96.1  PLT 162 235*   Basic Metabolic Panel Recent Labs    02/12/17 1300 02/13/17 0305  NA 132* 133*  K 3.7 3.6  CL 102 107  CO2 20* 22  GLUCOSE 155* 85  BUN 37* 29*  CREATININE 1.54* 1.22  CALCIUM 7.9* 7.1*  MG 2.4 2.4   Liver Function Tests No results for input(s): AST, ALT, ALKPHOS, BILITOT, PROT, ALBUMIN in the last 72 hours. No results for input(s): LIPASE, AMYLASE in the last 72 hours. Cardiac Enzymes No results for input(s): CKTOTAL, CKMB, CKMBINDEX, TROPONINI in the last 72 hours.  BNP: BNP (last 3 results) No results for input(s): BNP in the last 8760 hours.  ProBNP (last 3 results) No results for input(s): PROBNP in  the last 8760 hours.  D-Dimer No results for input(s): DDIMER in the last 72 hours. Hemoglobin A1C No results for input(s): HGBA1C in the last 72 hours. Fasting Lipid Panel No results for input(s): CHOL, HDL, LDLCALC, TRIG, CHOLHDL, LDLDIRECT in the last 72 hours. Thyroid Function Tests No results for input(s): TSH, T4TOTAL, T3FREE, THYROIDAB in the last 72 hours.  Invalid input(s): FREET3  Other results:   Imaging   No results found.  Medications:    Scheduled Medications: . diltiazem  60 mg Oral Q6H  . docusate sodium  100 mg Oral Daily  . ipratropium  2 spray Each Nare QID  . midodrine  10 mg Oral TID WC  . pantoprazole  40 mg Oral BID  . sodium chloride flush  3  mL Intravenous Q12H    Infusions: . sodium chloride 250 mL (02/11/17 2000)  . phenylephrine (NEO-SYNEPHRINE) Adult infusion Stopped (02/10/17 0806)    PRN Medications: sodium chloride, acetaminophen, albuterol, ondansetron (ZOFRAN) IV, simethicone, sodium chloride flush  Patient Profile   [name] is a 76 y.o. 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   Admitted with lightheadedness 2/2 blood loss anemia with GI bleed. Cardiology consulted with worsening SOB. HF team consulted with severe HOCM and cardiogenic shock.   Assessment/Plan   1. Cardiogenic shock in setting of HOCM: Complicated anatomy/physiology with probably moderate aortic stenosis and moderate aortic insufficiency, severe LVOT gradient from HOCM, and moderate to severe MR.  SBP in 90s-100s and co-ox remains quite low at 39%.  He was initially on phenylephrine, now off.  Disopyramide attempted but had torsades thought to be related to disopyramide and this was stopped.  Currently on diltiazem + midodrine.  CVP 7.  He is markedly symptomatic with exertion.  - Seen by Dr. Prescott Gum.  Ideally would have myectomy + aortic valve replacement + mitral valve repair but not stable for this and seems to have very poor reserve.   - No Lasix, CVP ok.  - Increase midodrine to 15 mg tid for more afterload and continue diltiazem.  - With torsades and to decrease contractility, will try metoprolol 12.5 mg every 6 hrs and titrate up as tolerated.  - Off disopyramide with torsades.  - He has history of AV nodal ablation with BiV pacing.  Discussed with Dr. Caryl Comes => will try turning off LV lead today under echo guidance to see if this helps his LVOT gradient.  If gradient falls with RV pacing alone, will leave only RV lead on.  2. GI bleed: Fairly stable hemoglobin.  Got feraheme 11/8.   3. Chronic Afib: s/p AV nodal ablation with BiV pacing. Eliquis  on hold.  4. CAD:  Coronary angiography 11/16 with patent ostial and mid LAD stents.   5. VT: Torsades on 11/9, thought to be due to disopyramide which has been stopped. No further arrhythmia.  As above, adding metoprolol.   CRITICAL CARE Performed by: Loralie Champagne  Total critical care time: 35 minutes  Critical care time was exclusive of separately billable procedures and treating other patients.  Critical care was necessary to treat or prevent imminent or life-threatening deterioration.  Critical care was time spent personally by me on the following activities: development of treatment plan with patient and/or surrogate as well as nursing, discussions with consultants, evaluation of patient's response to treatment, examination of patient, obtaining history from patient or surrogate, ordering and performing treatments  and interventions, ordering and review of laboratory studies, ordering and review of radiographic studies, pulse oximetry and re-evaluation of patient's condition.  Length of Stay: 12  Loralie Champagne, MD  02/13/2017, 8:35 AM  Advanced Heart Failure Team Pager [phone] (M-F; 7a - 4p)  Please contact [facility] for night-coverage after hours (4p -7a ) and weekends on [email or web address]  Device interrogated at bedside with Dr. Caryl Comes.  Echo guidance used for device optimization.  At baseline, LV lead was off, and patient was RV pacing only.  Peak LVOT gradient 190 mmHg with RV pacing.  We changed device settings to BiV pacing with fall in LVOT gradient to 160 mmHg.  With LV only pacing, LVOT gradient was 150 mmHg.  We left him with LV only pacing.    Also of note, patient is in NSR underlying rhythm, not atrial fibrillation.   Loralie Champagne 02/13/2017 10:30 AM

## 2017-02-13 NOTE — Progress Notes (Signed)
  Echocardiogram 2D Echocardiogram has been performed.  [name] 02/13/2017, 10:42 AM

## 2017-02-13 NOTE — Progress Notes (Signed)
Consulted by Dr. Virgina Jock to re-assume care of patient Given complexity and ICU stay with predominant CV issues still, will defer at this time and can assume care when stabilized for SDU Thanks  Verneita Griffes, MD [facility] (P) [phone]

## 2017-02-13 NOTE — Progress Notes (Signed)
PROGRESS NOTE    [name]  PYK:[id number] DOB: [birth year] DOA: 02/01/2017 PCP: [name], MD   Specialists:   Heart failure Cardiology Gangj and Patwardhan  Brief Narrative:  76 year old male Admitted by hospitalist service 10/29 with dyspnea and GI bleed-unclear where transfused this admission History of atrial fibrillation and pacemaker History of hypertrophic cardiomyopathy severe basal hypertrophy with LVOT obstruction AV nodal ablation + biventricular pacemaker placement 09/2016  TEE 10/20/2016 showed dynamic LVOT obstruction subaortic membrane-aortic valve fixed right coronary cusp with myxomatous thickened prolapsing mitral valve  Cardiology was consulted and patient was eventually taken for cath 02/09/2017 and heart failure team was consulted for severe hocm and low cardiac output He underwent left and right heart cath 02/09/2017 showing no significant obstructive CAD He was placed on Neo-Synephrine and Disopyramide added--cardiothoracic surgery consulted but patient felt too debilitated for any type of surgery  Assessment & Plan:   Active Problems:   Iron deficiency anemia   Hypertrophic obstructive cardiomyopathy (HCC)   HTN (hypertension)   Subaortic stenosis   Hyperlipidemia   Hypotension   Atrial fibrillation (HCC)   GI bleed   Acute on chronic diastolic CHF (congestive heart failure) (HCC)   Torsades de pointes (HCC)   HOCM, Cardiogenic shock 2/2 LVOT/Subaortic stenosis Afib,flutter s/p AV nodal ablation with PPM-on eliquis PTA--In Sinus after interrogation 11/10 CAd + stent 2012 Hypotension 2/2 to abiove He has had re-programming of PPM 11/2, L + RCath 11/6--Tx to 2h for Cardogenic shock and was transiently on Phenylephrine to improve OP state -His LV lead was adjusted by Cardiology with hope of aiding his HF--Cardiology disscussed case with [facility] specialists But felt to be a poor candidate for procedures with comorbids + poor targets--CVTS  Dr. Nils Pyle was consulted here regarding possible surgery but ffelt was too high risk Subsequently was placed on Disopyramide however then had Torsades [place]  11/9 Finally EP has attempted to turn off AV lead under echo guidance to aide with HF Defer to cardiology management of disease Cont for now Cardizem 60 q 6 lopressor 12.5 q 6, Midodrine 15 tid  Recent GIB and had endoscope 01/29/17--1 U prbc Tx this admit Seen by Howie Ill this admit on admit and signed off as no further bleed Interval moitoring  Leukocytosis since 11/5--he has only had low grade temps of 99.1, not sure if leukocytosis is infectious  cxr 11/8 showed confluent R upper lobe concerning for PNa Plan not concerned about the patient's risk for dysphagia and the patient is having no cough and had a full breakfast this morning Urine cult from 11/2 was neg and he has a condom cath occasionally has a little discomfort at the beginning of passing urine but has not had overt dysuria  AKI Mild hyponatremia Lasix as per Cardiology  Bun/Creat 29/1.25 ---Bun /creat is relatively stable with regards to pta values    Defer to cardiology for further management, I do not think the patient needs any further workup at this time for fever or infectious etiology and my understanding is that the patient may need to be transferred for subspecialty cardiology care to [facility]    Procedures:  [facility]: RA mean 12 PA 76/25 PCWP mean 20  CI 1.6 Mean aortic valve gradient 25 mmHg Peak to peak LV apex-aorta gradient 178 mmHg  Echo:11/1 LV EF 65-70%, moderate to severe LVH with LVOT/AoV gradient (very high gradient across aortic valve, likely contribution from moderate LVH and severe LVOT gradient).  Moderate AS and moderate AI.  Moderate to severe MR.  Moderate TR.  PASP 72 mmHg.    Repeat echo 11/10 for device optimization Impressions:  - Device optimization study. LVEF approximately 65-70% with mitral    annular calcification and significant left atrial enlargement.   Peak LVOT gradient was approximately 190 mmHg with RV pacing.   With biventricular pacing, LVOT gradient decreased to   approximately 160 mmHg. With LV only pacing, LVOT gradient was   approximately150 mmHg.  Antimicrobials:   None and none required   Subjective: Alert pleasant sitting up in the bed without any distress eating and drinking No chest pain no nausea no vomiting no shortness of breath No fever no chills no blurred vision no double vision  Objective: Vitals:   02/13/17 0400 02/13/17 0500 02/13/17 0600 02/13/17 0700  BP: (!) 103/58 (!) 85/54 (!) 77/58 (!) 98/56  Pulse: 69 66 65 65  Resp: 19 19 (!) 22 20  Temp:      TempSrc:      SpO2: 96% 95% 93% 95%  Weight:      Height:        Intake/Output Summary (Last 24 hours) at 02/13/2017 0751 Last data filed at 02/13/2017 0700 Gross per 24 hour  Intake 959 ml  Output 600 ml  Net 359 ml   Filed Weights   02/10/17 0500 02/11/17 0500 02/12/17 0404  Weight: 84 kg (185 lb 3 oz) 83.1 kg (183 lb 3.2 oz) 84.8 kg (186 lb 15.2 oz)    Examination:  EOMI NCAT Chest clinically clear without added sounds no rales no rhonchi IV access in the right side of neck S1-S2 harsh 4/6 murmur across precordium Abdomen soft nontender Left arm appears a little bit swollen at site of prior IV No pallor no icterus Moderate dentition no JVD   Data Reviewed: I have personally reviewed following labs and imaging studies  CBC: Recent Labs  Lab 02/08/17 0701 02/10/17 0828 02/11/17 0445 02/12/17 0407 02/13/17 0305  WBC 9.2 11.7* 15.0* 14.8* 13.6*  NEUTROABS  --  8.6*  --   --   --   HGB 9.4* 8.6* 8.7* 8.9* 8.4*  HCT 29.2* 27.9* 27.6* 29.0* 26.9*  MCV 93.6 95.5 95.8 95.7 96.1  PLT 130* 152 149* 162 283*   Basic Metabolic Panel: Recent Labs  Lab 02/10/17 0425 02/10/17 0828 02/11/17 0445 02/12/17 0407 02/12/17 1300 02/13/17 0305  NA 136  --  134* 133* 132*  133*  K 4.0  --  3.9 3.7 3.7 3.6  CL 106  --  104 102 102 107  CO2 21*  --  22 22 20* 22  GLUCOSE 118*  --  102* 91 155* 85  BUN 41*  --  36* 36* 37* 29*  CREATININE 1.35*  --  1.43* 1.43* 1.54* 1.22  CALCIUM 8.0*  --  7.7* 8.0* 7.9* 7.1*  MG 2.2  --  2.2 2.3 2.4 2.4  PHOS  --  3.3  --   --   --   --    GFR: Estimated Creatinine Clearance: 58.3 mL/min (by C-G formula based on SCr of 1.22 mg/dL). Liver Function Tests: No results for input(s): AST, ALT, ALKPHOS, BILITOT, PROT, ALBUMIN in the last 168 hours. No results for input(s): LIPASE, AMYLASE in the last 168 hours. No results for input(s): AMMONIA in the last 168 hours. Coagulation Profile: Recent Labs  Lab 02/08/17 0453  INR 1.32   Cardiac Enzymes: No results for input(s): CKTOTAL, CKMB,  CKMBINDEX, TROPONINI in the last 168 hours. BNP (last 3 results) No results for input(s): PROBNP in the last 8760 hours. HbA1C: No results for input(s): HGBA1C in the last 72 hours. CBG: No results for input(s): GLUCAP in the last 168 hours. Lipid Profile: No results for input(s): CHOL, HDL, LDLCALC, TRIG, CHOLHDL, LDLDIRECT in the last 72 hours. Thyroid Function Tests: No results for input(s): TSH, T4TOTAL, FREET4, T3FREE, THYROIDAB in the last 72 hours. Anemia Panel: Recent Labs    02/11/17 1010  VITAMINB12 2,200*  FOLATE 33.0  FERRITIN 98  TIBC 251  IRON 23*  RETICCTPCT 10.5*   Urine analysis:    Component Value Date/Time   COLORURINE AMBER (A) 02/12/2017 0934   APPEARANCEUR CLEAR 02/12/2017 0934   LABSPEC 1.021 02/12/2017 0934   PHURINE 5.0 02/12/2017 0934   GLUCOSEU NEGATIVE 02/12/2017 0934   HGBUR NEGATIVE 02/12/2017 [place] 02/12/2017 [place] 02/12/2017 0934   PROTEINUR NEGATIVE 02/12/2017 0934   UROBILINOGEN 0.2 01/13/2015 2015   NITRITE NEGATIVE 02/12/2017 0934   LEUKOCYTESUR NEGATIVE 02/12/2017 0934     Radiology Studies: Reviewed images personally in health database      Scheduled Meds: . diltiazem  60 mg Oral Q6H  . docusate sodium  100 mg Oral Daily  . ipratropium  2 spray Each Nare QID  . midodrine  10 mg Oral TID WC  . pantoprazole  40 mg Oral BID  . sodium chloride flush  3 mL Intravenous Q12H   Continuous Infusions: . sodium chloride 250 mL (02/11/17 2000)  . phenylephrine (NEO-SYNEPHRINE) Adult infusion Stopped (02/10/17 0806)     LOS: 12 days    Time spent: 45 minutes    Verneita Griffes, MD [facility]   If 7PM-7AM, please contact night-coverage [email or web address] Password [email or web address] 02/13/2017, 7:51 AM

## 2017-02-14 LAB — BASIC METABOLIC PANEL
ANION GAP: 9 (ref 5–15)
BUN: 29 mg/dL — ABNORMAL HIGH (ref 6–20)
CALCIUM: 7.6 mg/dL — AB (ref 8.9–10.3)
CO2: 18 mmol/L — ABNORMAL LOW (ref 22–32)
Chloride: 104 mmol/L (ref 101–111)
Creatinine, Ser: 1.25 mg/dL — ABNORMAL HIGH (ref 0.61–1.24)
GFR, EST NON AFRICAN AMERICAN: 55 mL/min — AB (ref >60)
GLUCOSE: 98 mg/dL (ref 65–99)
POTASSIUM: 4.8 mmol/L (ref 3.5–5.1)
Sodium: 131 mmol/L — ABNORMAL LOW (ref 135–145)

## 2017-02-14 LAB — CBC
HCT: 30.9 % — ABNORMAL LOW (ref 39.0–52.0)
HEMOGLOBIN: 9.7 g/dL — AB (ref 13.0–17.0)
MCH: 30.5 pg (ref 26.0–34.0)
MCHC: 31.4 g/dL (ref 30.0–36.0)
MCV: 97.2 fL (ref 78.0–100.0)
Platelets: 159 10*3/uL (ref 150–400)
RBC: 3.18 MIL/uL — AB (ref 4.22–5.81)
RDW: 19.3 % — ABNORMAL HIGH (ref 11.5–15.5)
WBC: 14.9 10*3/uL — AB (ref 4.0–10.5)

## 2017-02-14 LAB — MAGNESIUM: MAGNESIUM: 2.3 mg/dL (ref 1.7–2.4)

## 2017-02-14 MED ORDER — FUROSEMIDE 10 MG/ML IJ SOLN
40.0000 mg | Freq: Once | INTRAMUSCULAR | Status: AC
  Administered 2017-02-14: 40 mg via INTRAVENOUS
  Filled 2017-02-14: qty 4

## 2017-02-14 MED ORDER — POTASSIUM CHLORIDE CRYS ER 20 MEQ PO TBCR
40.0000 meq | EXTENDED_RELEASE_TABLET | Freq: Once | ORAL | Status: AC
  Administered 2017-02-14: 40 meq via ORAL
  Filled 2017-02-14: qty 2

## 2017-02-14 NOTE — Progress Notes (Signed)
Patient ID: [name], male   DOB: 1940-12-10, 76 y.o.   MRN: [id number]     Advanced Heart Failure Rounding Note  Primary Cardiologist:  Dr. Einar Gip  Reason for Consultation: HOCM/CHF  Subjective:    Initially on phenylephrine, now stopped. Seen by Dr. Prescott Gum 02/11/17. Too debilitated for surgery at this time.  11/9 had torsades requiring defibrillation and brief CPR.  Disopyramide stopped as likely precipitating torsades.   Device interrogated at bedside with Dr. Caryl Comes on 11/10.  Echo guidance used for device optimization.  At baseline, LV lead was off, and patient was RV pacing only.  Peak LVOT gradient 190 mmHg with RV pacing.  We changed device settings to BiV pacing with fall in LVOT gradient to 160 mmHg.  With LV only pacing, LVOT gradient was 150 mmHg.  We left him with LV only pacing.   Patient felt better subjectively yesterday after device settings adjusted. Able to sit up in chair for 9 hours and more appetite, but still very fatigued with most exertion.  SBP 80s-90s but he has been getting his metoprolol and diltiazem.   Unable to draw co-ox off line this morning.    CVP 10-11.   L/RHC: RA mean 12 PA 76/25 PCWP mean 20  CI 1.6 Mean aortic valve gradient 25 mmHg Peak to peak LV apex-aorta gradient 178 mmHg  Echo: LV EF 65-70%, moderate to severe LVH with LVOT/AoV gradient (very high gradient across aortic valve, likely contribution from moderate LVH and severe LVOT gradient).  Moderate AS and moderate AI.  Moderate to severe MR.  Moderate TR.  PASP 72 mmHg.   Objective:   Weight Range: 179 lb 14.3 oz (81.6 kg) Body mass index is 24.4 kg/m.   Vital Signs:   Temp:  [97.7 F (36.5 C)-99.1 F (37.3 C)] 99.1 F (37.3 C) (11/11 0315) Pulse Rate:  [63-76] 76 (11/11 0600) Resp:  [15-24] 24 (11/11 0600) BP: (81-100)/(52-68) 92/61 (11/11 0600) SpO2:  [93 %-100 %] 96 % (11/11 0600) Weight:  [179 lb 14.3 oz (81.6 kg)-188 lb 15 oz (85.7 kg)] 179 lb 14.3 oz (81.6  kg) (11/11 0430) Last BM Date: 02/11/17  Weight change: Filed Weights   02/12/17 0404 02/13/17 0800 02/14/17 0430  Weight: 186 lb 15.2 oz (84.8 kg) 188 lb 15 oz (85.7 kg) 179 lb 14.3 oz (81.6 kg)   Intake/Output:   Intake/Output Summary (Last 24 hours) at 02/14/2017 0602 Last data filed at 02/14/2017 0500 Gross per 24 hour  Intake 850 ml  Output 820 ml  Net 30 ml    Physical Exam    CVP 10-11 General: NAD Neck: JVP 10-12 cm, no thyromegaly or thyroid nodule.  Lungs: Slight crackles at bases. CV: Nondisplaced PMI.  Heart regular S1/S2, no S3/S4, 3/6 SEM RUSB and 3/6 HSM apex. 1+ edema to knees bilaterally.  Abdomen: Soft, nontender, no hepatosplenomegaly, no distention.  Skin: Intact without lesions or rashes.  Neurologic: Alert and oriented x 3.  Psych: Normal affect. Extremities: No clubbing or cyanosis.  HEENT: Normal.    Telemetry   V-paced in 60s, personally reviewed. Underlying NSR.    Labs    CBC Recent Labs    02/12/17 0407 02/13/17 0305  WBC 14.8* 13.6*  HGB 8.9* 8.4*  HCT 29.0* 26.9*  MCV 95.7 96.1  PLT 162 448*   Basic Metabolic Panel Recent Labs    02/12/17 1300 02/13/17 0305  NA 132* 133*  K 3.7 3.6  CL 102 107  CO2  20* 22  GLUCOSE 155* 85  BUN 37* 29*  CREATININE 1.54* 1.22  CALCIUM 7.9* 7.1*  MG 2.4 2.4   Liver Function Tests No results for input(s): AST, ALT, ALKPHOS, BILITOT, PROT, ALBUMIN in the last 72 hours. No results for input(s): LIPASE, AMYLASE in the last 72 hours. Cardiac Enzymes No results for input(s): CKTOTAL, CKMB, CKMBINDEX, TROPONINI in the last 72 hours.  BNP: BNP (last 3 results) No results for input(s): BNP in the last 8760 hours.  ProBNP (last 3 results) No results for input(s): PROBNP in the last 8760 hours.  D-Dimer No results for input(s): DDIMER in the last 72 hours. Hemoglobin A1C No results for input(s): HGBA1C in the last 72 hours. Fasting Lipid Panel No results for input(s): CHOL, HDL,  LDLCALC, TRIG, CHOLHDL, LDLDIRECT in the last 72 hours. Thyroid Function Tests No results for input(s): TSH, T4TOTAL, T3FREE, THYROIDAB in the last 72 hours.  Invalid input(s): FREET3  Other results:   Imaging   No results found.  Medications:    Scheduled Medications: . diltiazem  60 mg Oral Q6H  . docusate sodium  100 mg Oral Daily  . furosemide  40 mg Intravenous Once  . ipratropium  2 spray Each Nare QID  . metoprolol tartrate  12.5 mg Oral Q6H  . midodrine  15 mg Oral TID WC  . pantoprazole  40 mg Oral BID  . potassium chloride  40 mEq Oral Once  . sodium chloride flush  3 mL Intravenous Q12H    Infusions: . sodium chloride 250 mL (02/13/17 0700)  . phenylephrine (NEO-SYNEPHRINE) Adult infusion Stopped (02/10/17 0806)    PRN Medications: sodium chloride, acetaminophen, albuterol, ondansetron (ZOFRAN) IV, simethicone, sodium chloride flush  Patient Profile   [name] is a 76 y.o. 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   Admitted with lightheadedness 2/2 blood loss anemia with GI bleed. Cardiology consulted with worsening SOB. HF team consulted with severe HOCM and cardiogenic shock.   Assessment/Plan   1. Cardiogenic shock in setting of HOCM: Complicated anatomy/physiology with probably moderate aortic stenosis and moderate aortic insufficiency, severe LVOT gradient from HOCM, and moderate to severe MR.  SBP in 80s-90s and co-ox had been consistently very low (though not able to draw from line today).  He was initially on phenylephrine, now off.  Disopyramide attempted but had torsades thought to be related to disopyramide and this was stopped.  Currently on diltiazem + low dose metoprolol + midodrine.  Pacemaker adjusted with echo guidance yesterday with Dr. Caryl Comes, he was left with LV only pacing (lowest LVOT gradient in this mode).  He felt subjectively better but still  very fatigued with any exertion. CVP 10-11 today.  - Seen by Dr. Prescott Gum.  Ideally would have myectomy + aortic valve replacement + mitral valve repair but not stable for this and seems to have very poor reserve.   - Lasix 40 mg IV x 1 today with rise in CVP and JVD.   - Continue higher dose of midodrine, 15 mg tid.  - Continue metoprolol at low dose and diltiazem.  Does not have BP room to increase (but his doses are not being held).   - Off disopyramide with torsades.  - Continue LV only pacing.   2. GI bleed: Fairly stable hemoglobin.  Got feraheme 11/8.   3. Chronic Afib: s/p AV nodal ablation with BiV pacing. Eliquis on hold.  Patient has actually been in NSR, confirmed by device interrogation on 11/10.  4. CAD:  Coronary angiography 11/16 with patent ostial and mid LAD stents.   5. VT: Torsades on 11/9, thought to be due to disopyramide which has been stopped. No further arrhythmia.  He is now on metoprolol.  6. Frailty/deconditioning: Ongoing PT work needed, mobilize.   Length of Stay: 23  Loralie Champagne, MD  02/14/2017, 6:02 AM  Advanced Heart Failure Team Pager [phone] (M-F; 7a - 4p)  Please contact [facility] for night-coverage after hours (4p -7a ) and weekends on [email or web address]

## 2017-02-15 LAB — CBC
HEMATOCRIT: 30.4 % — AB (ref 39.0–52.0)
Hemoglobin: 9.5 g/dL — ABNORMAL LOW (ref 13.0–17.0)
MCH: 30.7 pg (ref 26.0–34.0)
MCHC: 31.3 g/dL (ref 30.0–36.0)
MCV: 98.4 fL (ref 78.0–100.0)
PLATELETS: 161 10*3/uL (ref 150–400)
RBC: 3.09 MIL/uL — AB (ref 4.22–5.81)
RDW: 19.8 % — ABNORMAL HIGH (ref 11.5–15.5)
WBC: 12.3 10*3/uL — AB (ref 4.0–10.5)

## 2017-02-15 LAB — BASIC METABOLIC PANEL
Anion gap: 7 (ref 5–15)
BUN: 27 mg/dL — AB (ref 6–20)
CHLORIDE: 104 mmol/L (ref 101–111)
CO2: 23 mmol/L (ref 22–32)
CREATININE: 1.32 mg/dL — AB (ref 0.61–1.24)
Calcium: 7.9 mg/dL — ABNORMAL LOW (ref 8.9–10.3)
GFR calc non Af Amer: 51 mL/min — ABNORMAL LOW (ref >60)
GFR, EST AFRICAN AMERICAN: 60 mL/min — AB (ref >60)
Glucose, Bld: 107 mg/dL — ABNORMAL HIGH (ref 65–99)
POTASSIUM: 4.3 mmol/L (ref 3.5–5.1)
SODIUM: 134 mmol/L — AB (ref 135–145)

## 2017-02-15 LAB — MAGNESIUM: Magnesium: 2.3 mg/dL (ref 1.7–2.4)

## 2017-02-15 MED ORDER — FUROSEMIDE 10 MG/ML IJ SOLN
40.0000 mg | Freq: Once | INTRAMUSCULAR | Status: AC
  Administered 2017-02-15: 40 mg via INTRAVENOUS
  Filled 2017-02-15: qty 4

## 2017-02-15 MED FILL — Medication: Qty: 1 | Status: AC

## 2017-02-15 NOTE — Progress Notes (Signed)
At shift change 07.30 am, pt heart rate dropped into the 50's. Amy HF NP was in the room noticed the same and contacted EP. Pacer was checked and adjust and rate returned to 70BPM. Rate dropped again. EP and Bensimhon were on the unit and came to check the pacer. Rate and setting were adjusted again and patient now pacing at [place], RN, BSN, MSN. RN3

## 2017-02-15 NOTE — Progress Notes (Addendum)
Occupational Therapy Treatment and Re-evaluation Patient Details Name: [name] MRN: [id number] DOB: 11-09-1940 Today's Date: 02/15/2017    History of present illness Pt is 76 y.o. male admitted to hospital with GI bleed, anemia, and hypotension. Pt also with acute on chronic heart failure and transferred to ICU. Pt with unresponsive event shocked x1 11/9. PMH includes HTN, afib s/p ablation, pacemaker, CAD with stenting, R hemicolectomy.   OT comments  Pt demonstrating significant functional decline from previous OT sessions due to significant medical events since initial evaluation. Pt currently requires max assist for LB ADL, min assist for UB ADL, and mod assist for sit<>stand in preparation for ADL participation. He presents with significantly diminished activity tolerance for ADL reporting fatigue with minimal activity this session and only able to stand for approximately 2 minutes as a precursor to ADL activity. Additionally, pt with low BP 91/55 at beginning of session (cleared with RN to see pt) and down to 78/53 at end of session with RN aware. Pt would continue to benefit from OT services while admitted to improve independence with and tolerance for ADL participation. Continue to recommend SNF level rehabilitation.    Follow Up Recommendations  SNF;Supervision/Assistance - 24 hour    Equipment Recommendations  Other (comment)(TBD at next venue )    Recommendations for Other Services      Precautions / Restrictions Precautions Precautions: Fall Precaution Comments: watch BP Restrictions Weight Bearing Restrictions: No       Mobility Bed Mobility Overal bed mobility: Needs Assistance Bed Mobility: Supine to Sit     Supine to sit: HOB elevated     General bed mobility comments: OOB in chair on my arrival.   Transfers Overall transfer level: Needs assistance Equipment used: Rolling walker (2 wheeled) Transfers: Sit to/from Stand Sit to Stand: Mod  assist Stand pivot transfers: Min assist;+2 safety/equipment       General transfer comment: Mod assist to rise to standing with one person assistance. Pt with significant fatigue and only able to maintain standing position taking a few marching in place steps with min assist for balance for approximately 2 minutes.     Balance Overall balance assessment: Needs assistance Sitting-balance support: No upper extremity supported;Feet supported Sitting balance-Leahy Scale: Fair     Standing balance support: Bilateral upper extremity supported Standing balance-Leahy Scale: Poor Standing balance comment: Relies on B UE support as well as external assistance to maintain standing.                            ADL either performed or assessed with clinical judgement   ADL Overall ADL's : Needs assistance/impaired Eating/Feeding: Supervision/ safety;Sitting   Grooming: Supervision/safety;Sitting   Upper Body Bathing: Minimal assistance;Sitting   Lower Body Bathing: Maximal assistance;Sit to/from stand   Upper Body Dressing : Minimal assistance;Sitting   Lower Body Dressing: Maximal assistance;Sit to/from Health and safety inspector Details (indicate cue type and reason): Able to complete sit<>stand with mod assist to power up to standing.  Toileting- Clothing Manipulation and Hygiene: Maximal assistance;Sit to/from stand         General ADL Comments: Pt very fatigued this session. Reports feeling as though he is carrying around a "ton of bricks." Minimal activity is highly taxing for pt.      Vision Baseline Vision/History: Wears glasses Wears Glasses: At all times Patient Visual Report: No change from baseline Additional Comments: Will continue to assess. No deficits  noted on eval.    Perception     Praxis      Cognition Arousal/Alertness: Awake/alert Behavior During Therapy: WFL for tasks assessed/performed Overall Cognitive Status: Within Functional Limits for  tasks assessed                                 General Comments: Requiring encouragement to participate        Exercises     Shoulder Instructions       General Comments Pt with HR paced at 60 bpm throughout session, SpO2 >90% on 2L O2 thorughout. BP initially 91/55 and down to 78/53 after activity. RN notified and monitoring.      Pertinent Vitals/ Pain       Pain Assessment: Faces Faces Pain Scale: Hurts little more Pain Location: generalized Pain Descriptors / Indicators: Discomfort Pain Intervention(s): Monitored during session  Home Living Family/patient expects to be discharged to:: Private residence Living Arrangements: Alone Available Help at Discharge: Family;Available 24 hours/day(grandson) Type of Home: House Home Access: Stairs to enter [employer] of Steps: 6 Entrance Stairs-Rails: Right;Left;Can reach both Home Layout: One level     Bathroom Shower/Tub: Tub/shower unit;Curtain   Bathroom Toilet: Handicapped height     Home Equipment: Environmental consultant - 2 wheels;Cane - single point          Prior Functioning/Environment Level of Independence: Independent        Comments: wife passed away in July 14, 2022, reports sometimes he is too tired to shower and sponge bathes seated at the sink   Frequency  Min 2X/week        Progress Toward Goals  OT Goals(current goals can now be found in the care plan section)  Progress towards OT goals: Goals drowngraded-see care plan  Acute Rehab OT Goals Patient Stated Goal: feel stronger OT Goal Formulation: With patient Time For Goal Achievement: 03/01/17 Potential to Achieve Goals: Good ADL Goals Pt Will Perform Grooming: with set-up;sitting Pt Will Perform Lower Body Bathing: with min assist;sit to/from stand Pt Will Perform Lower Body Dressing: with min assist;sit to/from stand Pt Will Transfer to Toilet: with min assist;ambulating Pt Will Perform Toileting - Clothing Manipulation and  hygiene: with min assist;sit to/from stand  Plan Discharge plan remains appropriate    Co-evaluation                 AM-PAC PT "6 Clicks" Daily Activity     Outcome Measure   Help from another person eating meals?: A Little Help from another person taking care of personal grooming?: A Little Help from another person toileting, which includes using toliet, bedpan, or urinal?: A Lot Help from another person bathing (including washing, rinsing, drying)?: A Lot Help from another person to put on and taking off regular upper body clothing?: A Lot Help from another person to put on and taking off regular lower body clothing?: A Lot 6 Click Score: 14    End of Session Equipment Utilized During Treatment: Gait belt  OT Visit Diagnosis: Unsteadiness on feet (R26.81)   Activity Tolerance Patient limited by fatigue   Patient Left in chair;with family/visitor present   Nurse Communication Mobility status;Other (comment)(Low BP)        Time: 7782-4235 OT Time Calculation (min): 24 min  Charges: OT General Charges $OT Visit: 1 Visit OT Evaluation $OT Re-eval: 1 Re-eval OT Treatments $Self Care/Home Management : 8-22 mins  Nadja Lina A Goran Olden, MS OTR/L  Pager: [place] 02/15/2017, 3:59 PM

## 2017-02-15 NOTE — Progress Notes (Signed)
Orthopedic Tech Progress Note Patient Details:  [name] 05-23-1940 [id number]  Ortho Devices Type of Ortho Device: Ace wrap, Unna boot Ortho Device/Splint Interventions: Application   [name] 02/15/2017, 1:58 PM

## 2017-02-15 NOTE — Progress Notes (Signed)
Patient ID: [name], male   DOB: 1940-11-28, 76 y.o.   MRN: [id number]     Advanced Heart Failure Rounding Note  Primary Cardiologist:  Dr. Einar Gip  Reason for Consultation: HOCM/CHF  Subjective:    Initially on phenylephrine, now stopped. Seen by Dr. Prescott Gum 02/11/17. Too debilitated for surgery at this time.  11/9 had torsades requiring defibrillation and brief CPR.  Disopyramide stopped as likely precipitating torsades.   Device interrogated at bedside with Dr. Caryl Comes on 11/10.  Echo guidance used for device optimization.  At baseline, LV lead was off, and patient was RV pacing only.  Peak LVOT gradient 190 mmHg with RV pacing.  We changed device settings to BiV pacing with fall in LVOT gradient to 160 mmHg.  With LV only pacing, LVOT gradient was 150 mmHg.  We left him with LV only pacing.   Yesterday received 40 mg IV lasix x1 . Negative 1.2 liters   Denies SOB. Some chest soreness. Says tylenol relieves pain.   L/RHC: RA mean 12 PA 76/25 PCWP mean 20  CI 1.6 Mean aortic valve gradient 25 mmHg Peak to peak LV apex-aorta gradient 178 mmHg  Echo: LV EF 65-70%, moderate to severe LVH with LVOT/AoV gradient (very high gradient across aortic valve, likely contribution from moderate LVH and severe LVOT gradient).  Moderate AS and moderate AI.  Moderate to severe MR.  Moderate TR.  PASP 72 mmHg.   Objective:   Weight Range: 191 lb 5.8 oz (86.8 kg) Body mass index is 25.95 kg/m.   Vital Signs:   Temp:  [97.6 F (36.4 C)-99.1 F (37.3 C)] 99.1 F (37.3 C) (11/12 0328) Pulse Rate:  [45-80] 50 (11/12 0700) Resp:  [18-25] 18 (11/12 0700) BP: (71-120)/(49-100) 120/100 (11/12 0700) SpO2:  [93 %-99 %] 95 % (11/12 0700) Weight:  [191 lb 5.8 oz (86.8 kg)] 191 lb 5.8 oz (86.8 kg) (11/12 0500) Last BM Date: 02/11/17  Weight change: Filed Weights   02/13/17 0800 02/14/17 0430 02/15/17 0500  Weight: 188 lb 15 oz (85.7 kg) 179 lb 14.3 oz (81.6 kg) 191 lb 5.8 oz (86.8 kg)    Intake/Output:   Intake/Output Summary (Last 24 hours) at 02/15/2017 0731 Last data filed at 02/15/2017 0541 Gross per 24 hour  Intake 600 ml  Output 1805 ml  Net -1205 ml    Physical Exam   CVP 11-12  General:  Pale  No resp difficulty HEENT: normal Neck: supple. JVP ~10. Carotids 2+ bilat; no bruits. No lymphadenopathy or thryomegaly appreciated. RIJ  Cor: PMI nondisplaced. Regular rate & rhythm. 3/6 SEM RUSB and 3/6 HSM apex.  Lungs: clear Abdomen: soft, nontender, nondistended. No hepatosplenomegaly. No bruits or masses. Good bowel sounds. Extremities: no cyanosis, clubbing, rash, R and LLE 2+ edema. SCDs + ted hose in place. Neuro: alert & orientedx3, cranial nerves grossly intact. moves all 4 extremities w/o difficulty. Affect pleasant      Telemetry   Pacer not capturing 50s. Personally reviewed.   Labs    CBC Recent Labs    02/14/17 0529 02/15/17 0626  WBC 14.9* 12.3*  HGB 9.7* 9.5*  HCT 30.9* 30.4*  MCV 97.2 98.4  PLT 159 637   Basic Metabolic Panel Recent Labs    02/14/17 0529 02/15/17 0626  NA 131* 134*  K 4.8 4.3  CL 104 104  CO2 18* 23  GLUCOSE 98 107*  BUN 29* 27*  CREATININE 1.25* 1.32*  CALCIUM 7.6* 7.9*  MG 2.3 2.3  Liver Function Tests No results for input(s): AST, ALT, ALKPHOS, BILITOT, PROT, ALBUMIN in the last 72 hours. No results for input(s): LIPASE, AMYLASE in the last 72 hours. Cardiac Enzymes No results for input(s): CKTOTAL, CKMB, CKMBINDEX, TROPONINI in the last 72 hours.  BNP: BNP (last 3 results) No results for input(s): BNP in the last 8760 hours.  ProBNP (last 3 results) No results for input(s): PROBNP in the last 8760 hours.  D-Dimer No results for input(s): DDIMER in the last 72 hours. Hemoglobin A1C No results for input(s): HGBA1C in the last 72 hours. Fasting Lipid Panel No results for input(s): CHOL, HDL, LDLCALC, TRIG, CHOLHDL, LDLDIRECT in the last 72 hours. Thyroid Function Tests No results for  input(s): TSH, T4TOTAL, T3FREE, THYROIDAB in the last 72 hours.  Invalid input(s): FREET3  Other results:   Imaging   No results found.  Medications:    Scheduled Medications: . diltiazem  60 mg Oral Q6H  . docusate sodium  100 mg Oral Daily  . ipratropium  2 spray Each Nare QID  . metoprolol tartrate  12.5 mg Oral Q6H  . midodrine  15 mg Oral TID WC  . pantoprazole  40 mg Oral BID  . sodium chloride flush  3 mL Intravenous Q12H    Infusions: . sodium chloride 250 mL (02/14/17 1900)  . phenylephrine (NEO-SYNEPHRINE) Adult infusion Stopped (02/10/17 0806)    PRN Medications: sodium chloride, acetaminophen, albuterol, ondansetron (ZOFRAN) IV, simethicone, sodium chloride flush  Patient Profile   [name] is a 76 y.o. 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   Admitted with lightheadedness 2/2 blood loss anemia with GI bleed. Cardiology consulted with worsening SOB. HF team consulted with severe HOCM and cardiogenic shock.   Assessment/Plan   1. Cardiogenic shock in setting of HOCM: Complicated anatomy/physiology with probably moderate aortic stenosis and moderate aortic insufficiency, severe LVOT gradient from HOCM, and moderate to severe MR.  SBP in 80s-90s and co-ox had been consistently very low (though not able to draw from line today).  He was initially on phenylephrine, now off.  Disopyramide attempted but had torsades thought to be related to disopyramide and this was stopped.  Currently on diltiazem + low dose metoprolol + midodrine.  Pacemaker adjusted with echo guidance yesterday with Dr. Caryl Comes, he was left with LV only pacing (lowest LVOT gradient in this mode).   - Seen by Dr. Prescott Gum.  Ideally would have myectomy + aortic valve replacement + mitral valve repair but not stable for this and seems to have very poor reserve.   - CVP 11-12.  - Give 40 mg IV lasix x1.    -  Continue higher dose of midodrine, 15 mg tid.  - Continue metoprolol at low dose and diltiazem.  - SBP soft can not up titrate.   - Off disopyramide with torsades.  - Pacer not capturing for the last 6 hours.  [employer] called to evaluate.   2. GI bleed: Fairly stable hemoglobin.  Got feraheme 11/8.  Hgb stable.  3. Chronic Afib: s/p AV nodal ablation with BiV pacing. Eliquis on hold.   Was in NSR earlier. Underlying rhythm this am on device is atrial tach  4. CAD:  Coronary angiography 11/16 with patent ostial and mid LAD stents.   5. VT: Torsades on 11/9, thought to be due to disopyramide which has been stopped. No further arrhythmia.  Stable on metoprolol.  K and Mag stable today.  6. Frailty/deconditioning: Ongoing PT work needed, mobilize. Continue PT.   Length of Stay: [place], NP  02/15/2017, 7:31 AM  Advanced Heart Failure Team Pager [phone] (M-F; 7a - 4p)  Please contact [facility] for night-coverage after hours (4p -7a ) and weekends on [email or web address]   Patient seen and examined with Darrick Grinder, NP. We discussed all aspects of the encounter. I agree with the assessment and plan as stated above.   Remains very tenuous. Had pacemaker malfunction overnight. This am device reprogrammed at bedside CVP up despite IV lasix yesterday. No further torsades. Will diurese again today (carefully with high dynamic LVOT gradient).  Goal will be to diurese gently and hope to get to rehab in the next few days. Discussed with Dr. Einar Gip.   Glori Bickers, MD  9:13 AM

## 2017-02-15 NOTE — Progress Notes (Signed)
Physical Therapy Treatment Patient Details Name: [name] MRN: [id number] DOB: 1940-09-29 Today's Date: 02/15/2017    History of Present Illness Pt is 76 y.o. male admitted to hospital with GI bleed, anemia, and hypotension. Pt also with acute on chronic heart failure and transferred to ICU. Pt with unresponsive event shocked x1 11/9. PMH includes HTN, afib s/p ablation, pacemaker, CAD with stenting, R hemicolectomy.    PT Comments    Pt seemed very fatigued at beginning of session. BP supine 112/41mmHg. Min-modA to get EOB pt reports increased fatigued. 2 person hand held assist for stand pivot transfer to [facility] BP sitting 99/37mmHg. Had BM (~15 mins). Pt not motivated for ambulation. Was able to ambulate ~27ft. VC for proper hand placement and slow descend to recliner. BP seated in recliner 89/109mmHg. Pt will continue to benefit from PT to help increase their independence and mobility.  Follow Up Recommendations  SNF     Equipment Recommendations  Other (comment)(to be assessed )    Recommendations for Other Services       Precautions / Restrictions Precautions Precautions: Fall Precaution Comments: watch BP Restrictions Weight Bearing Restrictions: No    Mobility  Bed Mobility Overal bed mobility: Needs Assistance Bed Mobility: Supine to Sit     Supine to sit: HOB elevated     General bed mobility comments: OOB in chair on my arrival.   Transfers Overall transfer level: Needs assistance Equipment used: Rolling walker (2 wheeled) Transfers: Sit to/from Stand Sit to Stand: Mod assist Stand pivot transfers: Min assist;+2 safety/equipment       General transfer comment: Mod assist to rise to standing with one person assistance. Pt with significant fatigue and only able to maintain standing position taking a few marching in place steps with min assist for balance for approximately 2 minutes.   Ambulation/Gait Ambulation/Gait assistance: Min assist;+2  safety/equipment Ambulation Distance (Feet): 6 Feet(chair follow ) Assistive device: Rolling walker (2 wheeled) Gait Pattern/deviations: Step-to pattern;Decreased step length - right;Decreased step length - left;Shuffle;Trunk flexed Gait velocity: decr   General Gait Details: min Assist for balance. VC for erect posutre and to hold head up   Stairs            Wheelchair Mobility    Modified Rankin (Stroke Patients Only)       Balance Overall balance assessment: Needs assistance Sitting-balance support: No upper extremity supported;Feet supported Sitting balance-Leahy Scale: Fair     Standing balance support: Bilateral upper extremity supported Standing balance-Leahy Scale: Poor Standing balance comment: Relies on B UE support as well as external assistance to maintain standing.                             Cognition Arousal/Alertness: Awake/alert Behavior During Therapy: WFL for tasks assessed/performed Overall Cognitive Status: Within Functional Limits for tasks assessed                                 General Comments: Requiring encouragement to participate      Exercises      General Comments General comments (skin integrity, edema, etc.): Pt with HR paced at 60 bpm throughout session, SpO2 >90% on 2L O2 thorughout. BP initially 91/55 and down to 78/53 after activity. RN notified and monitoring.        Pertinent Vitals/Pain Pain Assessment: Faces Faces Pain Scale: Hurts little more Pain Location: generalized  Pain Descriptors / Indicators: Discomfort Pain Intervention(s): Monitored during session    Home Living Family/patient expects to be discharged to:: Private residence Living Arrangements: Alone Available Help at Discharge: Family;Available 24 hours/day(grandson) Type of Home: House Home Access: Stairs to enter Entrance Stairs-Rails: Right;Left;Can reach both Home Layout: One level Home Equipment: Environmental consultant - 2 wheels;Cane -  single point      Prior Function Level of Independence: Independent      Comments: wife passed away in 07/29/2022, reports sometimes he is too tired to shower and sponge bathes seated at the sink   PT Goals (current goals can now be found in the care plan section) Acute Rehab PT Goals Patient Stated Goal: feel stronger Progress towards PT goals: Goals downgraded-see care plan    Frequency    Min 2X/week      PT Plan Current plan remains appropriate    Co-evaluation              AM-PAC PT "6 Clicks" Daily Activity  Outcome Measure                   End of Session Equipment Utilized During Treatment: Gait belt;Oxygen Activity Tolerance: Patient limited by fatigue Patient left: with call bell/phone within reach;in chair;with chair alarm set Nurse Communication: Mobility status PT Visit Diagnosis: Muscle weakness (generalized) (M62.81);Difficulty in walking, not elsewhere classified (R26.2)     Time: 0737-1062 PT Time Calculation (min) (ACUTE ONLY): 42 min  Charges:  $Therapeutic Activity: 38-52 mins                    G Codes:       Fransisca Connors, SPTA   Fransisca Connors 02/15/2017, 5:17 PM

## 2017-02-16 LAB — CBC
HEMATOCRIT: 30.1 % — AB (ref 39.0–52.0)
HEMOGLOBIN: 9.4 g/dL — AB (ref 13.0–17.0)
MCH: 30.7 pg (ref 26.0–34.0)
MCHC: 31.2 g/dL (ref 30.0–36.0)
MCV: 98.4 fL (ref 78.0–100.0)
Platelets: 153 10*3/uL (ref 150–400)
RBC: 3.06 MIL/uL — ABNORMAL LOW (ref 4.22–5.81)
RDW: 20.3 % — AB (ref 11.5–15.5)
WBC: 11.9 10*3/uL — ABNORMAL HIGH (ref 4.0–10.5)

## 2017-02-16 LAB — BASIC METABOLIC PANEL
Anion gap: 8 (ref 5–15)
BUN: 30 mg/dL — ABNORMAL HIGH (ref 6–20)
CALCIUM: 7.9 mg/dL — AB (ref 8.9–10.3)
CHLORIDE: 99 mmol/L — AB (ref 101–111)
CO2: 22 mmol/L (ref 22–32)
CREATININE: 1.41 mg/dL — AB (ref 0.61–1.24)
GFR calc Af Amer: 55 mL/min — ABNORMAL LOW (ref >60)
GFR calc non Af Amer: 48 mL/min — ABNORMAL LOW (ref >60)
GLUCOSE: 118 mg/dL — AB (ref 65–99)
Potassium: 4.3 mmol/L (ref 3.5–5.1)
Sodium: 129 mmol/L — ABNORMAL LOW (ref 135–145)

## 2017-02-16 LAB — MAGNESIUM: Magnesium: 2.2 mg/dL (ref 1.7–2.4)

## 2017-02-16 MED ORDER — FUROSEMIDE 10 MG/ML IJ SOLN
40.0000 mg | Freq: Once | INTRAMUSCULAR | Status: AC
  Administered 2017-02-16: 40 mg via INTRAVENOUS
  Filled 2017-02-16: qty 4

## 2017-02-16 MED ORDER — FUROSEMIDE 10 MG/ML IJ SOLN
40.0000 mg | Freq: Once | INTRAMUSCULAR | Status: AC
  Administered 2017-02-16: 40 mg via INTRAVENOUS

## 2017-02-16 NOTE — Progress Notes (Signed)
provided current SNF bed offers to pt- pt still prefers [facility] if he is appropriate for SNF at that time  Per MD note possibly will need more palliative approach- CSW will continue to follow for recommendations and assist as needed  Jorge Ny, [place] Social Worker [phone]

## 2017-02-16 NOTE — Progress Notes (Signed)
No charge note:   Palliative consult received.   Left message for Belenda Cruise requesting return call to schedule [place] meeting.  Mariana Kaufman, AGNP-C Palliative Medicine  Please call Palliative Medicine team phone with any questions [phone]. For individual providers please see AMION.

## 2017-02-16 NOTE — Progress Notes (Signed)
Patient ID: [name], male   DOB: 03-Jan-1941, 76 y.o.   MRN: [id number]     Advanced Heart Failure Rounding Note  Primary Cardiologist:  Dr. Einar Gip  Reason for Consultation: HOCM/CHF  Subjective:    Initially on phenylephrine, now stopped. Seen by Dr. Prescott Gum 02/11/17. Too debilitated for surgery at this time.  11/9 had torsades requiring defibrillation and brief CPR.  Disopyramide stopped as likely precipitating torsades.   Device interrogated at bedside with Dr. Caryl Comes on 11/10.  Echo guidance used for device optimization.  At baseline, LV lead was off, and patient was RV pacing only.  Peak LVOT gradient 190 mmHg with RV pacing.  We changed device settings to BiV pacing with fall in LVOT gradient to 160 mmHg.  With LV only pacing, LVOT gradient was 150 mmHg.  We left him with LV only pacing.   Yesterday diuresed with 40 mg IV lasix x1. Poor diuresis noted. Weight unchanged. CVP up to 21. Remains dyspneic  SBP 80-90s   L/RHC: RA mean 12 PA 76/25 PCWP mean 20  CI 1.6 Mean aortic valve gradient 25 mmHg Peak to peak LV apex-aorta gradient 178 mmHg  Echo: LV EF 65-70%, moderate to severe LVH with LVOT/AoV gradient (very high gradient across aortic valve, likely contribution from moderate LVH and severe LVOT gradient).  Moderate AS and moderate AI.  Moderate to severe MR.  Moderate TR.  PASP 72 mmHg.   Objective:   Weight Range: 190 lb 14.7 oz (86.6 kg) Body mass index is 25.89 kg/m.   Vital Signs:   Temp:  [97.6 F (36.4 C)-99.9 F (37.7 C)] 97.6 F (36.4 C) (11/13 0400) Pulse Rate:  [54-77] 59 (11/13 0700) Resp:  [17-27] 19 (11/13 0700) BP: (74-102)/(45-62) 82/53 (11/13 0700) SpO2:  [94 %-100 %] 94 % (11/13 0700) Weight:  [190 lb 14.7 oz (86.6 kg)] 190 lb 14.7 oz (86.6 kg) (11/13 0442) Last BM Date: 02/16/17  Weight change: Filed Weights   02/15/17 0500 02/16/17 0000 02/16/17 0442  Weight: 191 lb 5.8 oz (86.8 kg) 190 lb 14.7 oz (86.6 kg) 190 lb 14.7 oz (86.6  kg)   Intake/Output:   Intake/Output Summary (Last 24 hours) at 02/16/2017 0736 Last data filed at 02/16/2017 0400 Gross per 24 hour  Intake 470 ml  Output 765 ml  Net -295 ml    Physical Exam  CVP 20 General:  Appears chronically ill. Pale. No resp difficulty HEENT: normal Neck: supple. JVP to jaw.  Carotids 2+ bilat; no bruits. No lymphadenopathy or thryomegaly appreciated. Cor: PMI nondisplaced. Regular rate & rhythm. No rubs, gallops or murmurs. Lungs: Decreased  Abdomen: soft, nontender, nondistended. No hepatosplenomegaly. No bruits or masses. Good bowel sounds. Extremities: no cyanosis, clubbing, rash, R and LLE unna boots. R and L thigh 2-3+ edema.  Neuro: alert & orientedx3, cranial nerves grossly intact. moves all 4 extremities w/o difficulty. Affect pleasa     Telemetry   V pacing 60  Personally reviewed  Labs    CBC Recent Labs    02/15/17 0626 02/16/17 0202  WBC 12.3* 11.9*  HGB 9.5* 9.4*  HCT 30.4* 30.1*  MCV 98.4 98.4  PLT 161 063   Basic Metabolic Panel Recent Labs    02/14/17 0529 02/15/17 0626 02/16/17 0202  NA 131* 134*  --   K 4.8 4.3  --   CL 104 104  --   CO2 18* 23  --   GLUCOSE 98 107*  --   BUN 29*  27*  --   CREATININE 1.25* 1.32*  --   CALCIUM 7.6* 7.9*  --   MG 2.3 2.3 2.2   Liver Function Tests No results for input(s): AST, ALT, ALKPHOS, BILITOT, PROT, ALBUMIN in the last 72 hours. No results for input(s): LIPASE, AMYLASE in the last 72 hours. Cardiac Enzymes No results for input(s): CKTOTAL, CKMB, CKMBINDEX, TROPONINI in the last 72 hours.  BNP: BNP (last 3 results) No results for input(s): BNP in the last 8760 hours.  ProBNP (last 3 results) No results for input(s): PROBNP in the last 8760 hours.  D-Dimer No results for input(s): DDIMER in the last 72 hours. Hemoglobin A1C No results for input(s): HGBA1C in the last 72 hours. Fasting Lipid Panel No results for input(s): CHOL, HDL, LDLCALC, TRIG, CHOLHDL,  LDLDIRECT in the last 72 hours. Thyroid Function Tests No results for input(s): TSH, T4TOTAL, T3FREE, THYROIDAB in the last 72 hours.  Invalid input(s): FREET3  Other results:   Imaging   No results found.  Medications:    Scheduled Medications: . diltiazem  60 mg Oral Q6H  . docusate sodium  100 mg Oral Daily  . ipratropium  2 spray Each Nare QID  . metoprolol tartrate  12.5 mg Oral Q6H  . midodrine  15 mg Oral TID WC  . pantoprazole  40 mg Oral BID  . sodium chloride flush  3 mL Intravenous Q12H    Infusions: . sodium chloride 250 mL (02/15/17 1800)    PRN Medications: sodium chloride, acetaminophen, albuterol, ondansetron (ZOFRAN) IV, simethicone, sodium chloride flush  Patient Profile   [name] is a 76 y.o. 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   Admitted with lightheadedness 2/2 blood loss anemia with GI bleed. Cardiology consulted with worsening SOB. HF team consulted with severe HOCM and cardiogenic shock.   Assessment/Plan   1. Cardiogenic shock in setting of HOCM: Complicated anatomy/physiology with probably moderate aortic stenosis and moderate aortic insufficiency, severe LVOT gradient from HOCM, and moderate to severe MR.  SBP in 80s-90s and co-ox had been consistently very low (though not able to draw from line today).  He was initially on phenylephrine, now off.  Disopyramide attempted but had torsades thought to be related to disopyramide and this was stopped.  Currently on diltiazem + low dose metoprolol + midodrine.  Pacemaker adjusted with echo guidance yesterday with Dr. Caryl Comes, he was left with LV only pacing (lowest LVOT gradient in this mode).   - Seen by Dr. Prescott Gum.  Ideally would have myectomy + aortic valve replacement + mitral valve repair but not stable for this and seems to have very poor reserve.   - Continues to be extremely tenuous. Volume  status up markedly and more SOB. Worsening edema. Will try and diurese gently despite HOCM. Did not respond to lasix 40 IV. Will try 80 IV. Watch BP closely. May need to restart neo. Continue UNNA boots  - Continue higher dose of midodrine, 15 mg tid.  - Continue metoprolol at low dose and diltiazem.  - SBP soft can not up titrate.   - Off disopyramide with torsades.  - Pacer reprogrammed yesterday due to episodes of atrail tach 2. GI bleed: Fairly stable hemoglobin.  Got feraheme 11/8.  Hgb stable.  3. Chronic Afib: s/p AV nodal ablation with BiV pacing. Eliquis on hold.   - Pacer interrogated 11/12 and showed 70% atrial tach and  30% NSR. Device reprogrammed to promote v-pacing at 60. (has junctional escape in 50-55 range as well)  4. CAD:  Coronary angiography 11/16 with patent ostial and mid LAD stents.   5. VT: Torsades on 11/9, thought to be due to disopyramide which has been stopped. No further arrhythmia.  Stable on metoprolol.   K and Mag stable today.  6. Frailty/deconditioning: Ongoing PT work needed, mobilize. Continue PT.   I remain very concerned about him. Suspect we may be nearing palliative situation. I would like to get Palliatvie Care team involved to discuss [place].   Length of Stay: [place], NP  02/16/2017, 7:36 AM  Advanced Heart Failure Team Pager [phone] (M-F; [place])  Please contact [facility] for night-coverage after hours (4p -7a ) and weekends on [email or web address]

## 2017-02-17 DIAGNOSIS — Z515 Encounter for palliative care: Secondary | ICD-10-CM

## 2017-02-17 DIAGNOSIS — R0602 Shortness of breath: Secondary | ICD-10-CM

## 2017-02-17 DIAGNOSIS — L8990 Pressure ulcer of unspecified site, unspecified stage: Secondary | ICD-10-CM

## 2017-02-17 DIAGNOSIS — I480 Paroxysmal atrial fibrillation: Secondary | ICD-10-CM

## 2017-02-17 DIAGNOSIS — Z7189 Other specified counseling: Secondary | ICD-10-CM

## 2017-02-17 LAB — BASIC METABOLIC PANEL
Anion gap: 10 (ref 5–15)
BUN: 28 mg/dL — ABNORMAL HIGH (ref 6–20)
CHLORIDE: 101 mmol/L (ref 101–111)
CO2: 22 mmol/L (ref 22–32)
CREATININE: 1.53 mg/dL — AB (ref 0.61–1.24)
Calcium: 7.9 mg/dL — ABNORMAL LOW (ref 8.9–10.3)
GFR calc non Af Amer: 43 mL/min — ABNORMAL LOW (ref >60)
GFR, EST AFRICAN AMERICAN: 50 mL/min — AB (ref >60)
GLUCOSE: 167 mg/dL — AB (ref 65–99)
Potassium: 3.4 mmol/L — ABNORMAL LOW (ref 3.5–5.1)
Sodium: 133 mmol/L — ABNORMAL LOW (ref 135–145)

## 2017-02-17 LAB — CBC
HEMATOCRIT: 31.7 % — AB (ref 39.0–52.0)
HEMOGLOBIN: 9.8 g/dL — AB (ref 13.0–17.0)
MCH: 29.9 pg (ref 26.0–34.0)
MCHC: 30.9 g/dL (ref 30.0–36.0)
MCV: 96.6 fL (ref 78.0–100.0)
Platelets: 164 10*3/uL (ref 150–400)
RBC: 3.28 MIL/uL — ABNORMAL LOW (ref 4.22–5.81)
RDW: 19.8 % — AB (ref 11.5–15.5)
WBC: 12.2 10*3/uL — ABNORMAL HIGH (ref 4.0–10.5)

## 2017-02-17 LAB — MAGNESIUM: MAGNESIUM: 2 mg/dL (ref 1.7–2.4)

## 2017-02-17 MED ORDER — SODIUM CHLORIDE 0.9% FLUSH: 10.0000 mL | INTRAVENOUS | Status: DC | PRN

## 2017-02-17 MED ORDER — CHLORHEXIDINE GLUCONATE CLOTH 2 % EX PADS
6.0000 | MEDICATED_PAD | Freq: Every day | CUTANEOUS | Status: DC
  Administered 2017-02-18 – 2017-02-23 (×5): 6 via TOPICAL

## 2017-02-17 MED ORDER — ENOXAPARIN SODIUM 40 MG/0.4ML ~~LOC~~ SOLN
40.0000 mg | SUBCUTANEOUS | Status: AC
  Administered 2017-02-17 – 2017-02-19 (×3): 40 mg via SUBCUTANEOUS
  Filled 2017-02-17 (×3): qty 0.4

## 2017-02-17 MED ORDER — FUROSEMIDE 10 MG/ML IJ SOLN
80.0000 mg | Freq: Two times a day (BID) | INTRAMUSCULAR | Status: DC
  Administered 2017-02-17 – 2017-02-18 (×3): 80 mg via INTRAVENOUS
  Filled 2017-02-17 (×3): qty 8

## 2017-02-17 MED ORDER — SODIUM CHLORIDE 0.9% FLUSH
10.0000 mL | Freq: Two times a day (BID) | INTRAVENOUS | Status: DC
  Administered 2017-02-17 – 2017-02-25 (×7): 10 mL

## 2017-02-17 MED ORDER — IPRATROPIUM BROMIDE 0.06 % NA SOLN
2.0000 | Freq: Four times a day (QID) | NASAL | Status: DC | PRN
  Filled 2017-02-17: qty 15

## 2017-02-17 MED ORDER — MORPHINE SULFATE (CONCENTRATE) 10 MG/0.5ML PO SOLN
5.0000 mg | ORAL | Status: DC | PRN
  Administered 2017-02-26: 5 mg via SUBLINGUAL
  Filled 2017-02-17: qty 0.5

## 2017-02-17 MED ORDER — FUROSEMIDE 10 MG/ML IJ SOLN
80.0000 mg | Freq: Once | INTRAMUSCULAR | Status: AC
  Administered 2017-02-17: 80 mg via INTRAVENOUS
  Filled 2017-02-17: qty 8

## 2017-02-17 NOTE — Plan of Care (Signed)
Palliative care consult, patient receiving lasix BID for diuresis, Cardizem and mididrine for LVOT BP 90's SBP

## 2017-02-17 NOTE — Progress Notes (Signed)
Pts midnight does of Cardizem held due to decreased blood pressure.

## 2017-02-17 NOTE — Consult Note (Signed)
Consultation Note Date: 02/17/2017   Patient Name: [name]  DOB: 11-26-1940  MRN: [id number]  Age / Sex: 76 y.o., male  PCP: [name], MD Referring Physician: Nita Sells, MD  Reason for Consultation: Establishing goals of care  HPI/Patient Profile: 76 y.o. male  with past medical history of a. Fib ( s/p ablation and pacemaker placement, was on eliquis, now on hold d/t GI bleed s/p colonoscopy), HTN, CHF, aortic stenosis, admitted on 02/01/2017 with complaints of weakness and black tarry stool. Workup revealed GI bleed s/p colonoscopy with biopsy, anemia with hgb down to 8.9. He received transfusions. During admission he developed pulmonary edema and CHF exacerbation d/t fluid overload. 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 He has undergone pacemaker reprogramming and IV pressors (which have been weaned). 11/9 had an episode of Torsades and brief period of unresponsiveness, requiring chest compression and electric shock x 1. Continues to be diuresed by lasix, Cr is starting to trend up. Remains SOB with any effort. Continues to be hypotensive- reports this is normal for him. Ideally, he would need myectomy and valve replacements, however, at this point he is not stable for surgery and poor indication he would be able to regain status to be able to tolerate. Palliative medicine consulted for [place].    Clinical Assessment and Goals of Care:  I have reviewed medical records including EPIC notes, labs and imaging, received report from patient's nurse, and Amy Klegg, NP, assessed the patient and then met at the bedside along with Kathie Rhodes, NP  to discuss diagnosis prognosis, GOC, EOL wishes, disposition and options.  I introduced Palliative Medicine as specialized medical care for people living  with serious illness. It focuses on providing relief from the symptoms and stress of a serious illness. The goal is to improve quality of life for both the patient and the family.  Patient is in bed and notably SOB. He has SOB with any effort. He is sleeping- taking frequent naps.   We discussed a brief life review of the patient. He is from [place]. 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 His wife died in 07-30-2022. His supportive family members are step children- [name] and [name]. He also has a Games developer.   As far as functional and nutritional status- prior to admission he was completely independent. He had some difficulty with ambulation d/t SOB before having his pacemaker placed, but since pacemaker placement he had been living at home. His appetite has decreased significantly- especially since being in the hospital.   We discussed their current illness and what it means in the larger context of their on-going co-morbidities.  Natural disease trajectory and expectations at EOL were discussed. He understands that his options for treating his heart failure are limited. He discussed the need to "face reality". He expressed concerns that he will not leave the hospital. He would hope for more time out of the hospital if possible.  I attempted to elicit values and goals of care important to the patient. Being independent and out of the hospital were expressed.    The difference between aggressive medical intervention and comfort care were introduced and discussed.   Patient states he is hopeful that "this medicine" (lasix) will work and he will be able to walk and return home. We discussed the issue that the overall problem with his heart cannot be fixed without surgery and he cannot tolerate surgery. He expressed that he had been concerned that this was the case and that he was going to die. He does not have advance directives and has not discussed advanced directives with his  family. He says that if he were unable to make decisions he would want his stepson - [name] to be his Media planner.    Patient requested we contact his two step children- [name] and [name], and his grandson and arrange for further meeting for continued [place]. Will offer Hospice and address disposition and code status at that meeting.   Questions and concerns were addressed.    Primary Decision Maker PATIENT    SUMMARY OF RECOMMENDATIONS -Mophine 2.43m SL for SOB- discussed with nursing to please offer this to patient -PMT will attempt to arrange meeting with family members present for continued [place] pt would be eligible for hospice services, need to have code discussion    Code Status/Advance Care Planning:  Full code  Palliative Prophylaxis:   Frequent assessment for SOB, premed with morphine prior to activity  Additional Recommendations (Limitations, Scope, Preferences):  Full Scope Treatment  Prognosis:    < 6 months d/t cardiogenic shock d/t LVOT obstruction, not candidate for surgical intervention, high risk for arrhythmia and sudden cardiac death  Discharge Planning: To Be Determined  Primary Diagnoses: Present on Admission: . GI bleed . Atrial fibrillation ([place] . HTN (hypertension) . Iron deficiency anemia . Hyperlipidemia . Hypertrophic obstructive cardiomyopathy ([place] . Hypotension   I have reviewed the medical record, interviewed the patient and family, and examined the patient. The following aspects are pertinent.  Past Medical History:  Diagnosis Date  . Anemia   . Arthritis    "little in my fingers" (10/21/2016)  . Chronic lower back pain    "last 3 months; usually when I bend" (12/24/2011)  . Coronary artery disease   . Endocarditis   . GERD (gastroesophageal reflux disease)   . Heart murmur   . High cholesterol   . Hypertension   . Migraines    "none in years" (10/21/2016)  . Myocardial infarction ([facility]    POSSIBLE 2012 ELEVATED ENZYMES    . Pneumonia ~ 2011  . Presence of permanent cardiac pacemaker    Social History   Socioeconomic History  . Marital status: Widowed    Spouse name: None  . Number of children: None  . Years of education: None  . Highest education [name]: None  Social Needs  . Financial resource strain: None  . Food insecurity - worry: None  . Food insecurity - inability: None  . Transportation needs - medical: None  . Transportation needs - non-medical: None  Occupational History  . None  Tobacco Use  . Smoking status: Never Smoker  . Smokeless tobacco: Never Used  . Tobacco comment: 12/24/2011 "used to puff cigarettes; never inhaled"  Substance and Sexual Activity  . Alcohol use: Yes    Comment: 10/21/2016 "don't drink at all anymore; used to have a beer q 3-4 months"  . Drug use:  No  . Sexual activity: No  Other Topics Concern  . None  Social History Narrative  . None   Family History  Problem Relation Age of Onset  . Hypertension Mother   . Heart failure Father   . Cancer Neg Hx   . Diabetes Neg Hx   . Hyperlipidemia Neg Hx   . Sudden death Neg Hx   . Stroke Neg Hx   . Heart attack Neg Hx    Scheduled Meds: . diltiazem  60 mg Oral Q6H  . docusate sodium  100 mg Oral Daily  . ipratropium  2 spray Each Nare QID  . metoprolol tartrate  12.5 mg Oral Q6H  . midodrine  15 mg Oral TID WC  . pantoprazole  40 mg Oral BID  . sodium chloride flush  3 mL Intravenous Q12H   Continuous Infusions: . sodium chloride 250 mL (02/17/17 0800)   PRN Meds:.sodium chloride, acetaminophen, albuterol, morphine CONCENTRATE, ondansetron (ZOFRAN) IV, simethicone, sodium chloride flush Medications Prior to Admission:  Prior to Admission medications   Medication Sig Start Date End Date Taking? Authorizing Provider  albuterol (PROVENTIL HFA;VENTOLIN HFA) 108 (90 Base) MCG/ACT inhaler Inhale 1-2 puffs into the lungs every 6 (six) hours as needed for wheezing or shortness of [name]. 07/07/16  Yes Mabe,  Shanon Brow, NP  apixaban (ELIQUIS) 5 MG TABS tablet Take 5 mg by mouth 2 (two) times daily.   Yes [provider]  Ascorbic Acid (VITAMIN C) 500 MG tablet Take 500 mg by mouth 2 (two) times daily.     Yes [provider]  b complex vitamins tablet Take 1 tablet by mouth daily.   Yes [provider]  Bilberry 1000 MG CAPS Take 1,000 mg by mouth daily.    Yes [provider]  Cholecalciferol (VITAMIN D PO) Take 1 capsule by mouth daily.   Yes [provider]  Coenzyme Q10 (EQL COQ10) 300 MG CAPS Take 1 capsule by mouth every morning.    Yes [provider]  diphenhydrAMINE (BENADRYL) 25 MG tablet Take 25 mg by mouth daily as needed for itching.   Yes [provider]  Ferrous Sulfate (IRON) 325 (65 Fe) MG TABS Take 325 mg by mouth 2 (two) times daily.    Yes [provider]  furosemide (LASIX) 20 MG tablet Take 1 tablet (20 mg total) by mouth daily. 10/29/16 02/01/17 Yes Baldwin Jamaica, PA-C  Glucosamine HCl 1000 MG TABS Take 1,000 mg by mouth 2 (two) times daily.    Yes [provider]  HAWTHORN BERRY PO Take 1 capsule by mouth every morning.    Yes [provider]  HORSE CHESTNUT PO Take 1 capsule by mouth daily.   Yes [provider]  hydrocortisone cream 1 % Apply 1 application topically daily as needed for itching.   Yes [provider]  ipratropium (ATROVENT) 0.06 % nasal spray 2 sprays into each nostril 4 times a day as needed for runny nose. Patient taking differently: Place 2 sprays into both nostrils 4 (four) times daily as needed for rhinitis.  07/07/16  Yes Mabe, Shanon Brow, NP  metoprolol succinate (TOPROL-XL) 25 MG 24 hr tablet Take 25 mg by mouth daily.   Yes [provider]  Multiple Vitamin (MULTIVITAMIN WITH MINERALS) TABS Take 1 tablet by mouth every morning.    Yes [provider]  omeprazole (PRILOSEC) 20 MG capsule Take 20 mg by mouth daily. 01/29/17  Yes [provider]  Polyethyl  Glycol-Propyl Glycol (SYSTANE) 0.4-0.3 % SOLN Place 1 drop into both eyes 2 (two) times daily.    Yes [provider]  pravastatin (PRAVACHOL) 40 MG tablet Take 40 mg by mouth at bedtime.    Yes [provider]  saw palmetto 160 MG capsule Take 160 mg by mouth 2 (two) times daily.    Yes [provider]  vitamin E 400 UNIT capsule Take 400 Units by mouth daily.    Yes [provider]  VITAMIN K PO Take 1 capsule by mouth daily.   Yes [provider]  imiquimod (ALDARA) 5 % cream Apply 1 application topically at bedtime. 10/13/16   [provider]  triamcinolone cream (KENALOG) 0.1 % Apply 1 application topically 2 (two) times daily. Patient not taking: Reported on 02/01/2017 09/16/16   Vanessa Kick, MD   Allergies  Allergen Reactions  . Adhesive [Tape] Itching and Rash    (The patches affected his back, when he had his ablation)  . Brilinta [Ticagrelor] Itching and Rash  . Penicillins Rash    Has patient had a PCN reaction causing immediate rash, facial/tongue/throat swelling, SOB or lightheadedness with hypotension: Yes Has patient had a PCN reaction causing severe rash involving mucus membranes or skin necrosis: Yes Has patient had a PCN reaction that required hospitalization No Has patient had a PCN reaction occurring within the last 10 years: No If all of the above answers are "NO", then may proceed with Cephalosporin use.    Review of Systems  Constitutional: Positive for activity change and appetite change.  Cardiovascular: Positive for leg swelling.  Psychiatric/Behavioral: The patient is not nervous/anxious.     Physical Exam  Constitutional: He is oriented to person, place, and time. He appears well-developed. No distress.  Cardiovascular: Normal rate.  Pulmonary/Chest:  SOB  Neurological: He is alert and oriented to person, place, and time.  Skin: Skin is warm and dry. He is not diaphoretic.  There is pallor.  Nursing note and vitals reviewed.   Vital Signs: BP (!) 89/64   Pulse (!) 59   Temp 97.9 F (36.6 C)   Resp 18   Ht 6' (1.829 m)   Wt 86.8 kg (191 lb 5.8 oz)   SpO2 97%   BMI 25.95 kg/m  Pain Assessment: No/denies pain POSS *See Group Information*: 1-Acceptable,Awake and alert Pain Score: 0-No pain   SpO2: SpO2: 97 % O2 Device:SpO2: 97 % O2 Flow Rate: .O2 Flow Rate (L/min): 2 L/min  IO: Intake/output summary:   Intake/Output Summary (Last 24 hours) at 02/17/2017 1244 Last data filed at 02/17/2017 1100 Gross per 24 hour  Intake 1100 ml  Output 1450 ml  Net -350 ml    LBM: Last BM Date: 02/17/17 Baseline Weight: Weight: 80.4 kg (177 lb 4.8 oz) Most recent weight: Weight: 86.8 kg (191 lb 5.8 oz)     Palliative Assessment/Data: PPS: 30%     Thank you for this consult. Palliative medicine will continue to follow and assist as needed.   Time In: 1200 Time Out: 1330 Time Total: 90 mins Greater than 50%  of this time was spent counseling and coordinating care related to the above assessment and plan.  Signed by: Mariana Kaufman, AGNP-C Palliative Medicine    Please contact Palliative Medicine Team phone at [phone] for questions and concerns.  For individual provider: See Shea Evans

## 2017-02-17 NOTE — Progress Notes (Signed)
Labs not done this AM attempted to procure from central line, central line will not draw blood back. Lab notified to come draw labs.

## 2017-02-17 NOTE — Progress Notes (Signed)
Pt took medications crushed with applesauce.  Pt is sitting up in bed. Pt denies pain. Pt is VPaced.  Nonlabored breathing noted. Will continue to monitor.

## 2017-02-17 NOTE — Progress Notes (Signed)
Pt is resting sitting up in bed with eyes closed. Pt stated earlier that he rests best in a chair or sitting up. Pt looks uncomfortable. Will continue to monitor.

## 2017-02-17 NOTE — Progress Notes (Signed)
Patient ID: [name], male   DOB: March 17, 1941, 76 y.o.   MRN: [id number]     Advanced Heart Failure Rounding Note  Primary Cardiologist:  Dr. Einar Gip  Reason for Consultation: HOCM/CHF  Subjective:    Initially on phenylephrine, now stopped. Seen by Dr. Prescott Gum 02/11/17. Too debilitated for surgery at this time.  11/9 had torsades requiring defibrillation and brief CPR.  Disopyramide stopped as likely precipitating torsades.   Device interrogated at bedside with Dr. Caryl Comes on 11/10.  Echo guidance used for device optimization.  At baseline, LV lead was off, and patient was RV pacing only.  Peak LVOT gradient 190 mmHg with RV pacing.  We changed device settings to BiV pacing with fall in LVOT gradient to 160 mmHg.  With LV only pacing, LVOT gradient was 150 mmHg.  We left him with LV only pacing.   Yesterday diuresed with 40 mg IV lasix x1. Poor diuresis noted. Weight unchanged. CVP up to 21. Remains dyspneic  SBP 80-90s  L/RHC: RA mean 12 PA 76/25 PCWP mean 20  CI 1.6 Mean aortic valve gradient 25 mmHg Peak to peak LV apex-aorta gradient 178 mmHg  Echo: LV EF 65-70%, moderate to severe LVH with LVOT/AoV gradient (very high gradient across aortic valve, likely contribution from moderate LVH and severe LVOT gradient).  Moderate AS and moderate AI.  Moderate to severe MR.  Moderate TR.  PASP 72 mmHg.   Objective:   Weight Range: 191 lb 5.8 oz (86.8 kg) Body mass index is 25.95 kg/m.   Vital Signs:   Temp:  [98.1 F (36.7 C)-99 F (37.2 C)] 98.6 F (37 C) (11/14 0730) Pulse Rate:  [57-64] 60 (11/14 0800) Resp:  [14-25] 21 (11/14 0800) BP: (76-102)/(51-68) 97/64 (11/14 0800) SpO2:  [93 %-100 %] 97 % (11/14 0800) Weight:  [191 lb 5.8 oz (86.8 kg)] 191 lb 5.8 oz (86.8 kg) (11/14 0500) Last BM Date: 02/16/17  Weight change: Filed Weights   02/16/17 0000 02/16/17 0442 02/17/17 0500  Weight: 190 lb 14.7 oz (86.6 kg) 190 lb 14.7 oz (86.6 kg) 191 lb 5.8 oz (86.8 kg)    Intake/Output:   Intake/Output Summary (Last 24 hours) at 02/17/2017 0907 Last data filed at 02/17/2017 0830 Gross per 24 hour  Intake 1110 ml  Output 1650 ml  Net -540 ml    Physical Exam  CVP 15  General:  Appears chronically ill. On 2 liters. Appears breathless when talking.  HEENT: normal Neck: supple. JVP to jaw. Carotids 2+ bilat; no bruits. No lymphadenopathy or thryomegaly appreciated. Cor: PMI nondisplaced. Regular rate & rhythm. No rubs, gallops. 3/6 HSM apex SEM RUSB. . Lungs: Crackles in the bases on 2 liters.  Abdomen: soft, nontender, nondistended. No hepatosplenomegaly. No bruits or masses. Good bowel sounds. Extremities: no cyanosis, clubbing, rash, R and LLE unna boots. 2-3+ edema Neuro: alert & orientedx3, cranial nerves grossly intact. moves all 4 extremities w/o difficulty. Affect pleasant     Telemetry   V pacing 60  Personally reviewed  Labs    CBC Recent Labs    02/15/17 0626 02/16/17 0202  WBC 12.3* 11.9*  HGB 9.5* 9.4*  HCT 30.4* 30.1*  MCV 98.4 98.4  PLT 161 962   Basic Metabolic Panel Recent Labs    02/15/17 0626 02/16/17 0202 02/16/17 0924  NA 134*  --  129*  K 4.3  --  4.3  CL 104  --  99*  CO2 23  --  22  GLUCOSE  107*  --  118*  BUN 27*  --  30*  CREATININE 1.32*  --  1.41*  CALCIUM 7.9*  --  7.9*  MG 2.3 2.2  --    Liver Function Tests No results for input(s): AST, ALT, ALKPHOS, BILITOT, PROT, ALBUMIN in the last 72 hours. No results for input(s): LIPASE, AMYLASE in the last 72 hours. Cardiac Enzymes No results for input(s): CKTOTAL, CKMB, CKMBINDEX, TROPONINI in the last 72 hours.  BNP: BNP (last 3 results) No results for input(s): BNP in the last 8760 hours.  ProBNP (last 3 results) No results for input(s): PROBNP in the last 8760 hours.  D-Dimer No results for input(s): DDIMER in the last 72 hours. Hemoglobin A1C No results for input(s): HGBA1C in the last 72 hours. Fasting Lipid Panel No results for  input(s): CHOL, HDL, LDLCALC, TRIG, CHOLHDL, LDLDIRECT in the last 72 hours. Thyroid Function Tests No results for input(s): TSH, T4TOTAL, T3FREE, THYROIDAB in the last 72 hours.  Invalid input(s): FREET3  Other results:   Imaging   No results found.  Medications:    Scheduled Medications: . diltiazem  60 mg Oral Q6H  . docusate sodium  100 mg Oral Daily  . ipratropium  2 spray Each Nare QID  . metoprolol tartrate  12.5 mg Oral Q6H  . midodrine  15 mg Oral TID WC  . pantoprazole  40 mg Oral BID  . sodium chloride flush  3 mL Intravenous Q12H    Infusions: . sodium chloride 250 mL (02/17/17 0800)    PRN Medications: sodium chloride, acetaminophen, albuterol, ondansetron (ZOFRAN) IV, simethicone, sodium chloride flush  Patient Profile   [name] is a 76 y.o. 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   Admitted with lightheadedness 2/2 blood loss anemia with GI bleed. Cardiology consulted with worsening SOB. HF team consulted with severe HOCM and cardiogenic shock.   Assessment/Plan   1. Cardiogenic shock in setting of HOCM: Complicated anatomy/physiology with probably moderate aortic stenosis and moderate aortic insufficiency, severe LVOT gradient from HOCM, and moderate to severe MR.  SBP in 80s-90s and co-ox had been consistently very low (though not able to draw from line today).  He was initially on phenylephrine, now off.  Disopyramide attempted but had torsades thought to be related to disopyramide and this was stopped.  Currently on diltiazem + low dose metoprolol + midodrine.  Pacemaker adjusted with echo guidance yesterday with Dr. Caryl Comes, he was left with LV only pacing (lowest LVOT gradient in this mode).   - Seen by Dr. Prescott Gum.  Ideally would have myectomy + aortic valve replacement + mitral valve repair but not stable for this and seems to have very poor reserve.   -  Will try and diurese gently despite HOCM. Remains SOB with exertion and fatigues quickly with any movement.   CVP 15. Give 80 mg IV lasix today. Renal function up a little. Watch closely.   Continue UNNA boots  - Continue higher dose of midodrine, 15 mg tid.  - Continue metoprolol at low dose and diltiazem.  - Off disopyramide with torsades.  - Pacer reprogrammed yesterday due to episodes of atrail tach 2. GI bleed: Fairly stable hemoglobin.  Got feraheme 11/8.  Hgb stable.   3. Chronic Afib: s/p AV nodal ablation with BiV pacing. Eliquis on hold.   - Pacer interrogated 11/12 and showed 70% atrial tach and 30% NSR.  Device reprogrammed to promote v-pacing at 60. (has junctional escape in 50-55 range as well)  4. CAD:  Coronary angiography 11/16 with patent ostial and mid LAD stents.  No S/S ischemia  5. VT: Torsades on 11/9, thought to be due to disopyramide which has been stopped. No further arrhythmia.  Stable on metoprolol  K stable. Mag pending.   6. Frailty/deconditioning: PT recommending snf.   Disposition: SNF   Needs Palliative Care for [place]. Suspect he needs Hospice.    Length of Stay: [place], NP  02/17/2017, 9:07 AM  Advanced Heart Failure Team Pager [phone] (M-F; 7a - 4p)  Please contact [facility] for night-coverage after hours (4p -7a ) and weekends on [email or web address]  Patient seen and examined with Darrick Grinder, NP. We discussed all aspects of the encounter. I agree with the assessment and plan as stated above.   Remains critically ill. CVP not improving with diuresis. Will increase lasix to 80 IV bid. With severe HOCM have to be careful not to overdiurese as well. BP remains marginal. Has not been out of bed in 48 hours. Will try to get to chair today. With stable hgb will start DVT enoxaparin. Watch closely for bleeding.   Has been seen by Palliative Care and [place] discussions ongoing. I feel it is unlikely that we will ever get him strong enough to be a surgery  candidate.   Glori Bickers, MD  8:47 PM

## 2017-02-18 ENCOUNTER — Other Ambulatory Visit: Payer: Self-pay | Admitting: Physician Assistant

## 2017-02-18 LAB — MAGNESIUM: MAGNESIUM: 1.8 mg/dL (ref 1.7–2.4)

## 2017-02-18 LAB — CBC
HEMATOCRIT: 30.5 % — AB (ref 39.0–52.0)
HEMOGLOBIN: 9.2 g/dL — AB (ref 13.0–17.0)
MCH: 29.1 pg (ref 26.0–34.0)
MCHC: 30.2 g/dL (ref 30.0–36.0)
MCV: 96.5 fL (ref 78.0–100.0)
Platelets: 167 10*3/uL (ref 150–400)
RBC: 3.16 MIL/uL — AB (ref 4.22–5.81)
RDW: 19.5 % — ABNORMAL HIGH (ref 11.5–15.5)
WBC: 12.3 10*3/uL — ABNORMAL HIGH (ref 4.0–10.5)

## 2017-02-18 LAB — BASIC METABOLIC PANEL
ANION GAP: 10 (ref 5–15)
BUN: 26 mg/dL — AB (ref 6–20)
CHLORIDE: 97 mmol/L — AB (ref 101–111)
CO2: 26 mmol/L (ref 22–32)
Calcium: 7.7 mg/dL — ABNORMAL LOW (ref 8.9–10.3)
Creatinine, Ser: 1.43 mg/dL — ABNORMAL HIGH (ref 0.61–1.24)
GFR calc Af Amer: 54 mL/min — ABNORMAL LOW (ref >60)
GFR calc non Af Amer: 47 mL/min — ABNORMAL LOW (ref >60)
GLUCOSE: 129 mg/dL — AB (ref 65–99)
POTASSIUM: 3.2 mmol/L — AB (ref 3.5–5.1)
Sodium: 133 mmol/L — ABNORMAL LOW (ref 135–145)

## 2017-02-18 MED ORDER — LORAZEPAM 0.5 MG PO TABS
0.5000 mg | ORAL_TABLET | Freq: Four times a day (QID) | ORAL | Status: DC | PRN
  Administered 2017-02-20: 0.5 mg via ORAL
  Filled 2017-02-18: qty 1

## 2017-02-18 MED ORDER — MAGNESIUM SULFATE 2 GM/50ML IV SOLN
2.0000 g | Freq: Once | INTRAVENOUS | Status: AC
  Administered 2017-02-18: 2 g via INTRAVENOUS
  Filled 2017-02-18: qty 50

## 2017-02-18 MED ORDER — POTASSIUM CHLORIDE CRYS ER 20 MEQ PO TBCR
40.0000 meq | EXTENDED_RELEASE_TABLET | Freq: Three times a day (TID) | ORAL | Status: AC
  Administered 2017-02-18: 40 meq via ORAL
  Filled 2017-02-18 (×2): qty 2

## 2017-02-18 NOTE — Progress Notes (Addendum)
Daily Progress Note   Patient Name: [name]       Date: 02/18/2017 DOB: 05/04/1940  Age: 76 y.o. MRN#: [id number] Attending Physician: Nita Sells, MD Primary Care Physician: [name], MD Admit Date: 02/01/2017  Reason for Consultation/Follow-up: Establishing goals of care  Subjective:  Patient reports breathing better today. He did not sleep well last night- states he couldn't get comfortable.  Met with patient, daughter, and son, and grandson, to discuss disease state, prognosis, and goals of care. Patient is severely deconditioned and requires surgery in order to return to fully functional state. However, it is very unlikely (even with rehab admission and physical therapy) that he will be able to return to a condition where he would be able to tolerate surgery. We began discussing what patient's values and goals are given the fact he is likely facing end of lifetime. Patient stated being home and having what independence he can have are priorities to him. We were beginning to discuss option of optimizing while in hospital and transitioning care to home with Hospice when Dr. Haroldine Laws joined meeting and presented to family that surgery is not completely ruled out- he reiterated fact that would be very difficult, patient would require admission to rehab facility, however, he would continue to offer to patient if he chose.  Discussion continued with family and patient. Patient stated it would be his desire to "try for surgery" but if it were determined that he was unable to have surgery, then he would want to "have the best quality of life".   PMT encouraged patient and family to consider how and where he would want to spend his time given the fact that his time of life is  limited. We discussed what the two tracts of continued aggressive medical care (rehab/surger) vs comfort/qualilty would look like. Hospice services were offered and explained. His grandson, [name] would be able to stay in the home with him and assist with him residing at home with Hospice support. Advance directives were discussed. Advance directive packet was given to patient and family to review.  Patient would designate his step children as his decision makers. He does not want to be intubated long term. He would not want artificial feeding.  Code status was not discussed as discussion was protracted and family overwhelmed with information and  decision making.    We also discussed some medications for symptoms. Patient having some anxiety with this discussion. Encouraged him to try morphine for SOB, anxiety. Will also order prn medication for sleep.  .Review of Systems  Constitutional: Positive for chills.  Respiratory: Positive for cough and shortness of breath.   Cardiovascular: Positive for leg swelling.  Psychiatric/Behavioral: The patient is nervous/anxious.     Length of Stay: 17  Current Medications: Scheduled Meds:  . Chlorhexidine Gluconate Cloth  6 each Topical Daily  . diltiazem  60 mg Oral Q6H  . docusate sodium  100 mg Oral Daily  . enoxaparin (LOVENOX) injection  40 mg Subcutaneous Q24H  . furosemide  80 mg Intravenous BID  . metoprolol tartrate  12.5 mg Oral Q6H  . midodrine  15 mg Oral TID WC  . pantoprazole  40 mg Oral BID  . potassium chloride  40 mEq Oral Q8H  . sodium chloride flush  10-40 mL Intracatheter Q12H  . sodium chloride flush  3 mL Intravenous Q12H    Continuous Infusions: . sodium chloride Stopped (02/17/17 1200)  . magnesium sulfate 1 - 4 g bolus IVPB 2 g (02/18/17 0921)    PRN Meds: sodium chloride, acetaminophen, albuterol, ipratropium, morphine CONCENTRATE, ondansetron (ZOFRAN) IV, simethicone, sodium chloride flush, sodium chloride  flush  Physical Exam  Constitutional: He is oriented to person, place, and time. He appears well-developed and well-nourished. No distress.  Cardiovascular: Normal rate.  Pulmonary/Chest: Effort normal.  Neurological: He is alert and oriented to person, place, and time.  Skin: Skin is warm and dry. There is pallor.  Psychiatric: He has a normal mood and affect. His behavior is normal. Judgment and thought content normal.  Nursing note and vitals reviewed.           Vital Signs: BP (!) 93/59   Pulse 60   Temp 98.6 F (37 C) (Oral)   Resp 13   Ht 6' (1.829 m)   Wt 85.1 kg (187 lb 9.8 oz)   SpO2 96%   BMI 25.44 kg/m  SpO2: SpO2: 96 % O2 Device: O2 Device: Nasal Cannula O2 Flow Rate: O2 Flow Rate (L/min): 2 L/min  Intake/output summary:   Intake/Output Summary (Last 24 hours) at 02/18/2017 0947 Last data filed at 02/18/2017 7588 Gross per 24 hour  Intake 263 ml  Output 1775 ml  Net -1512 ml   LBM: Last BM Date: 02/17/17 Baseline Weight: Weight: 80.4 kg (177 lb 4.8 oz) Most recent weight: Weight: 85.1 kg (187 lb 9.8 oz)       Palliative Assessment/Data: PPS: 30%      Patient Active Problem List   Diagnosis Date Noted  . Pressure injury of skin 02/17/2017  . Shortness of breath   . Advance care planning   . Palliative care by specialist   . Goals of care, counseling/discussion   . Torsades de pointes ([place])   . Acute on chronic diastolic CHF (congestive heart failure) ([place]) 02/06/2017  . GI bleed 02/01/2017  . Atrial fibrillation ([place]) 10/22/2016  . Typical atrial flutter ([place]) 08/12/2016  . Atrial flutter ([place]) 08/12/2016  . Hypotension 05/06/2016  . Atrial fibrillation with RVR ([place]) 05/06/2016  . AKI (acute kidney injury) ([place]) 05/06/2016  . SVT (supraventricular tachycardia) ([place]) 01/31/2015  . Colonic mass, right colon. 11/10/2011  . Acute bacterial endocarditis 11/06/2011  . HTN (hypertension) 11/06/2011  . BPH (benign prostatic hyperplasia) 11/06/2011   . 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 Back pain 11/06/2011  .  Hyperlipidemia 11/06/2011  . Acute renal insufficiency 08/25/2010  . Viridans streptococci infection 08/25/2010  . Streptococcus infection 08/25/2010  . Tooth infection 08/25/2010  . Status post PICC central line placement 08/25/2010  . Arm pain, right 08/25/2010  . ENDOCARDITIS, BACTERIAL, SUBACUTE 05/07/2010  . Iron deficiency anemia 05/06/2010  . Hypertrophic obstructive cardiomyopathy ([place]) 05/06/2010  . SYNCOPE 05/06/2010  . DYSPNEA ON EXERTION 05/06/2010  . ECHOCARDIOGRAM, ABNORMAL 05/06/2010  . HEART MURMUR, HX OF 05/06/2010    Palliative Care Assessment & Plan   Patient Profile: 75 y.o. male  with past medical history of a. Fib ( s/p ablation and pacemaker placement, was on eliquis, now on hold d/t GI bleed s/p colonoscopy), HTN, CHF, aortic stenosis, admitted on 02/01/2017 with complaints of weakness and black tarry stool. Workup revealed GI bleed s/p colonoscopy with biopsy, anemia with hgb down to 8.9. He received transfusions. During admission he developed pulmonary edema and CHF exacerbation d/t fluid overload. 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 He has undergone pacemaker reprogramming and IV pressors (which have been weaned). 11/9 had an episode of Torsades and brief period of unresponsiveness, requiring chest compression and electric shock x 1. Continues to be diuresed by lasix, Cr is starting to trend up. Remains SOB with any effort. Continues to be hypotensive- reports this is normal for him. Ideally, he would need myectomy and valve replacements, however, at this point he is not stable for surgery and poor indication he would be able to regain status to be able to tolerate. Palliative medicine consulted for [place].      Assessment/Recommendations/Plan   Patient and family to discuss patient's GOC and f/u  with providers  Lorazepam .42m po q6 hr prn for anxiety or sleep    Code Status:  Full code  Prognosis:   < 3 months  Discharge Planning:  To Be Determined  Care plan was discussed with patient, step children, Amy, NP and Dr. BHaroldine Laws   Thank you for allowing the Palliative Medicine Team to assist in the care of this patient.   Time In: 0800 Time Out: 0930 Total Time 90 minutes Prolong Time billed Yes      Greater than 50%  of this time was spent counseling and coordinating care related to the above assessment and plan.  KMariana Kaufman AGNP-C Palliative Medicine   Please contact Palliative Medicine Team phone at [phone] questions and concerns.

## 2017-02-18 NOTE — Progress Notes (Signed)
Occupational Therapy Treatment Patient Details Name: [name] MRN: [id number] DOB: Jan 02, 1941 Today's Date: 02/18/2017    History of present illness Pt is 76 y.o. male admitted to hospital with GI bleed, anemia, and hypotension. Pt also with acute on chronic heart failure and transferred to ICU. Pt with unresponsive event shocked x1 11/9. PMH includes HTN, afib s/p ablation, pacemaker, CAD with stenting, R hemicolectomy.   OT comments  Pt maybe slightly improved today.  He  Was able to transfer bed > chair with less assistance (min A), but required and extensive amount of time to do so 24 mins, as he required and extended rest break sitting EOB before attempting to stand.  Pt unable to tolerate further activity.   VSS throughout session.    Follow Up Recommendations  SNF;Supervision/Assistance - 24 hour    Equipment Recommendations  None recommended by OT    Recommendations for Other Services      Precautions / Restrictions Precautions Precautions: Fall Precaution Comments: watch BP       Mobility Bed Mobility Overal bed mobility: Needs Assistance Bed Mobility: Supine to Sit     Supine to sit: HOB elevated;Min assist     General bed mobility comments: Min A to move LEs off be.  Requires increased amount of time   Transfers Overall transfer level: Needs assistance Equipment used: Rolling walker (2 wheeled) Transfers: Sit to/from [facility] Sit to Stand: Min assist Stand pivot transfers: Min assist       General transfer comment: assist to steady     Balance Overall balance assessment: Needs assistance Sitting-balance support: Feet supported;Bilateral upper extremity supported Sitting balance-Leahy Scale: Fair Sitting balance - Comments: Pt sat EOB ~17 mins in prep for transfer to chair    Standing balance support: Single extremity supported Standing balance-Leahy Scale: Poor Standing balance comment: requires assist to steady                             ADL either performed or assessed with clinical judgement   ADL Overall ADL's : Needs assistance/impaired                         Toilet Transfer: Minimal assistance;Stand-pivot;BSC Toilet Transfer Details (indicate cue type and reason): Min a to steady            General ADL Comments: Pt very limited by fatigue.  Requires an excessive amount of time to perform minimal activity      Vision       Perception     Praxis      Cognition Arousal/Alertness: Awake/alert Behavior During Therapy: WFL for tasks assessed/performed Overall Cognitive Status: Within Functional Limits for tasks assessed                                          Exercises     Shoulder Instructions       General Comments HR 60's; 02 sats >91%; BP stable     Pertinent Vitals/ Pain       Pain Assessment: Faces Faces Pain Scale: Hurts little more Pain Location: Lt LE  Pain Descriptors / Indicators: Sore Pain Intervention(s): Monitored during session  Home Living  Prior Functioning/Environment              Frequency  Min 2X/week        Progress Toward Goals  OT Goals(current goals can now be found in the care plan section)        Plan Discharge plan remains appropriate    Co-evaluation                 AM-PAC PT "6 Clicks" Daily Activity     Outcome Measure   Help from another person eating meals?: A Little Help from another person taking care of personal grooming?: A Little Help from another person toileting, which includes using toliet, bedpan, or urinal?: A Lot Help from another person bathing (including washing, rinsing, drying)?: A Lot Help from another person to put on and taking off regular upper body clothing?: A Lot Help from another person to put on and taking off regular lower body clothing?: Total 6 Click Score: 13    End of Session  Equipment Utilized During Treatment: Oxygen  OT Visit Diagnosis: Unsteadiness on feet (R26.81)   Activity Tolerance Patient limited by fatigue   Patient Left in chair;with call bell/phone within reach;with chair alarm set   Nurse Communication Mobility status        Time: 5188-4166 OT Time Calculation (min): 24 min  Charges: OT General Charges $OT Visit: 1 Visit OT Treatments $Therapeutic Activity: 23-37 mins  [facility], OTR/L [phone]    Lucille Passy M 02/18/2017, 1:30 PM

## 2017-02-18 NOTE — Progress Notes (Signed)
Patient ID: [name], male   DOB: [birth year], 76 y.o.   MRN: [id number]     Advanced Heart Failure Rounding Note  Primary Cardiologist:  Dr. Einar Gip  Reason for Consultation: HOCM/CHF  Subjective:    Initially on phenylephrine, now stopped. Seen by Dr. Prescott Gum 02/11/17. Too debilitated for surgery at this time.  11/9 had torsades requiring defibrillation and brief CPR.  Disopyramide stopped as likely precipitating torsades.   Device interrogated at bedside with Dr. Caryl Comes on 11/10.  Echo guidance used for device optimization.  At baseline, LV lead was off, and patient was RV pacing only.  Peak LVOT gradient 190 mmHg with RV pacing.  We changed device settings to BiV pacing with fall in LVOT gradient to 160 mmHg.  With LV only pacing, LVOT gradient was 150 mmHg.  We left him with LV only pacing.   Yesterday lasix increased to 80 mg twice a day. Weight down 4 pounds. Improved urine output.   Denies SOB. Has not been out of bed.    L/RHC: RA mean 12 PA 76/25 PCWP mean 20  CI 1.6 Mean aortic valve gradient 25 mmHg Peak to peak LV apex-aorta gradient 178 mmHg  Echo: LV EF 65-70%, moderate to severe LVH with LVOT/AoV gradient (very high gradient across aortic valve, likely contribution from moderate LVH and severe LVOT gradient).  Moderate AS and moderate AI.  Moderate to severe MR.  Moderate TR.  PASP 72 mmHg.   Objective:   Weight Range: 187 lb 9.8 oz (85.1 kg) Body mass index is 25.44 kg/m.   Vital Signs:   Temp:  [97.9 F (36.6 C)-99.7 F (37.6 C)] 98.6 F (37 C) (11/15 0400) Pulse Rate:  [47-69] 59 (11/15 0700) Resp:  [15-26] 22 (11/15 0700) BP: (86-112)/(54-66) 90/58 (11/15 0700) SpO2:  [95 %-100 %] 96 % (11/15 0700) Weight:  [187 lb 9.8 oz (85.1 kg)] 187 lb 9.8 oz (85.1 kg) (11/15 0500) Last BM Date: 02/17/17  Weight change: Filed Weights   02/16/17 0442 02/17/17 0500 02/18/17 0500  Weight: 190 lb 14.7 oz (86.6 kg) 191 lb 5.8 oz (86.8 kg) 187 lb 9.8 oz  (85.1 kg)   Intake/Output:   Intake/Output Summary (Last 24 hours) at 02/18/2017 0800 Last data filed at 02/18/2017 0530 Gross per 24 hour  Intake 463 ml  Output 1875 ml  Net -1412 ml    Physical Exam  CVP 15  General:  Elderly. NAD.  HEENT: normal Neck: supple. To jaw. Carotids 2+ bilat; no bruits. No lymphadenopathy or thryomegaly appreciated. Cor: PMI nondisplaced. Regular rate & rhythm. No rubs, gallops or murmurs. Lungs: clear on 3 liters oxygen.  Abdomen: soft, nontender, nondistended. No hepatosplenomegaly. No bruits or masses. Good bowel sounds. Extremities: no cyanosis, clubbing, rash, RLE/LLE  unna boot. RUE PICC  Neuro: alert & orientedx3, cranial nerves grossly intact. moves all 4 extremities w/o difficulty. Affect pleasant   Telemetry   V pacing 60 bpm.   Labs    CBC Recent Labs    02/17/17 1242 02/18/17 0454  WBC 12.2* 12.3*  HGB 9.8* 9.2*  HCT 31.7* 30.5*  MCV 96.6 96.5  PLT 164 326   Basic Metabolic Panel Recent Labs    02/17/17 1242 02/18/17 0454  NA 133* 133*  K 3.4* 3.2*  CL 101 97*  CO2 22 26  GLUCOSE 167* 129*  BUN 28* 26*  CREATININE 1.53* 1.43*  CALCIUM 7.9* 7.7*  MG 2.0 1.8   Liver Function Tests No results for  input(s): AST, ALT, ALKPHOS, BILITOT, PROT, ALBUMIN in the last 72 hours. No results for input(s): LIPASE, AMYLASE in the last 72 hours. Cardiac Enzymes No results for input(s): CKTOTAL, CKMB, CKMBINDEX, TROPONINI in the last 72 hours.  BNP: BNP (last 3 results) No results for input(s): BNP in the last 8760 hours.  ProBNP (last 3 results) No results for input(s): PROBNP in the last 8760 hours.  D-Dimer No results for input(s): DDIMER in the last 72 hours. Hemoglobin A1C No results for input(s): HGBA1C in the last 72 hours. Fasting Lipid Panel No results for input(s): CHOL, HDL, LDLCALC, TRIG, CHOLHDL, LDLDIRECT in the last 72 hours. Thyroid Function Tests No results for input(s): TSH, T4TOTAL, T3FREE, THYROIDAB  in the last 72 hours.  Invalid input(s): FREET3  Other results:   Imaging   No results found.  Medications:    Scheduled Medications: . Chlorhexidine Gluconate Cloth  6 each Topical Daily  . diltiazem  60 mg Oral Q6H  . docusate sodium  100 mg Oral Daily  . enoxaparin (LOVENOX) injection  40 mg Subcutaneous Q24H  . furosemide  80 mg Intravenous BID  . metoprolol tartrate  12.5 mg Oral Q6H  . midodrine  15 mg Oral TID WC  . pantoprazole  40 mg Oral BID  . sodium chloride flush  10-40 mL Intracatheter Q12H  . sodium chloride flush  3 mL Intravenous Q12H    Infusions: . sodium chloride Stopped (02/17/17 1200)    PRN Medications: sodium chloride, acetaminophen, albuterol, ipratropium, morphine CONCENTRATE, ondansetron (ZOFRAN) IV, simethicone, sodium chloride flush, sodium chloride flush  Patient Profile   AVIR DERUITER is a 76 y.o. 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   Admitted with lightheadedness 2/2 blood loss anemia with GI bleed. Cardiology consulted with worsening SOB. HF team consulted with severe HOCM and cardiogenic shock.   Assessment/Plan   1. Cardiogenic shock in setting of HOCM: Complicated anatomy/physiology with probably moderate aortic stenosis and moderate aortic insufficiency, severe LVOT gradient from HOCM, and moderate to severe MR.  SBP in 80s-90s and co-ox had been consistently very low (though not able to draw from line today).  He was initially on phenylephrine, now off.  Disopyramide attempted but had torsades thought to be related to disopyramide and this was stopped.  Currently on diltiazem + low dose metoprolol + midodrine.  Pacemaker adjusted with echo guidance yesterday with Dr. Caryl Comes, he was left with LV only pacing (lowest LVOT gradient in this mode).   - Seen by Dr. Prescott Gum.  Ideally would have myectomy + aortic valve replacement + mitral valve  repair but not stable for this and seems to have very poor reserve.   - Will try and diurese gently despite HOCM.  CVP down a little 20>15.  Continue lasix 80 mg twice a day. Renal function  Add 40 meq potassium twice a day.   Continue UNNA boots  - Continue higher dose of midodrine, 15 mg tid.  - Continue metoprolol at low dose and diltiazem.  - Off disopyramide with torsades.  - Pacer reprogrammed yesterday due to episodes of atrial tach 2. GI bleed: Fairly stable hemoglobin.  Got feraheme 11/8.  Hgb 9.2    3. Chronic Afib: s/p AV nodal ablation with BiV pacing. Holding eliquis.  On lovenox. Make sure he doesn't bleed over the next 24-48  May start tomorrow. Hgb stable. No overt signs of bleeding.  -  Pacer interrogated 11/12 and showed 70% atrial tach and 30% NSR. Device reprogrammed to promote v-pacing at 60. (has junctional escape in 50-55 range as well)  4. CAD:  Coronary angiography 11/16 with patent ostial and mid LAD stents.  No S/S ischemia  5. VT: Torsades on 11/9, thought to be due to disopyramide which has been stopped. No further arrhythmia.  Stable on metoprolol  K 3.2. Give 40 meq potassium twice daily.  Mag 1.8. Give 2 grams mag . Repeat mag in am.   6. Frailty/deconditioning: PT recommending snf.   Disposition: SNF   Needs Palliative Care for [place]. Suspect he needs Hospice.    Length of Stay: [place], NP  02/18/2017, 8:00 AM  Advanced Heart Failure Team Pager [phone] (M-F; 7a - 4p)  Please contact [facility] for night-coverage after hours (4p -7a ) and weekends on [email or web address]  Patient seen and examined with Darrick Grinder, NP. We discussed all aspects of the encounter. I agree with the assessment and plan as stated above.   CVP coming down with IV lasix but still very dyspneic and weak. Unable to get out of bed. I participated in family meeting today to discuss goals of care. Likely will plan hospice care. Will supp K+. Enoxaparin restarted last night. Hgb  stable.  Total time spent 35 minutes. Over half that time spent discussing above.   Glori Bickers, MD  8:32 AM

## 2017-02-18 NOTE — Progress Notes (Signed)
Physical Therapy Treatment Patient Details Name: CORMAC WINT MRN: [id number] DOB: [birth year] Today's Date: 02/18/2017    History of Present Illness Pt is 76 y.o. male admitted to hospital with GI bleed, anemia, and hypotension. Pt also with acute on chronic heart failure and transferred to ICU. Pt with unresponsive event shocked x1 11/9. PMH includes HTN, afib s/p ablation, pacemaker, CAD with stenting, R hemicolectomy.    PT Comments    Pt in chair upon arrival. BP 95/64 seated before treatment. Pt able to sit to stand with min assist for balance and posture. Pt able to walk to door of room on 2 L/min [place]. Very decreased gait speed with pronounced pursed lip breathing. Cues for posture and head position. O2 sats > 90%, HR in 60s during whole session. Performed seated exercises after  Ambulation. Cues for speed and technique. BP 89/61 seated after session. Pt would benefit from continued PT to increase strength, endurance, and functional independence.    Follow Up Recommendations  SNF     Equipment Recommendations  Other (comment)    Recommendations for Other Services       Precautions / Restrictions Precautions Precautions: Fall Precaution Comments: watch BP Restrictions Weight Bearing Restrictions: No    Mobility  Bed Mobility Overal bed mobility: Needs Assistance           General bed mobility comments: In recliner upon entry.  Transfers Overall transfer level: Needs assistance Equipment used: Rolling walker (2 wheeled) Transfers: Sit to/from Stand Sit to Stand: Min assist Stand pivot transfers: Min assist       General transfer comment: Min assist for balance  Ambulation/Gait Ambulation/Gait assistance: Min assist;+2 safety/equipment Ambulation Distance (Feet): 12 Feet Assistive device: Rolling walker (2 wheeled) Gait Pattern/deviations: Step-to pattern;Decreased step length - right;Decreased step length - left;Shuffle;Trunk flexed Gait velocity:  decr   General Gait Details: Min assist for balance. VC for posture and neutral head position. Very decreased gait speed. O2 2L/min [place].   Stairs            Wheelchair Mobility    Modified Rankin (Stroke Patients Only)       Balance Overall balance assessment: Needs assistance Sitting-balance support: Feet supported;Bilateral upper extremity supported Sitting balance-Leahy Scale: Fair Sitting balance - Comments:    Standing balance support: Bilateral upper extremity supported Standing balance-Leahy Scale: Poor Standing balance comment: requires assist to steady                             Cognition Arousal/Alertness: Awake/alert Behavior During Therapy: WFL for tasks assessed/performed Overall Cognitive Status: Within Functional Limits for tasks assessed                                        Exercises General Exercises - Lower Extremity Ankle Circles/Pumps: AROM;Both;20 reps;Supine Quad Sets: AROM;Both;10 reps;Supine Long Arc Quad: AROM;Both;10 reps;Seated Heel Slides: AROM;Both;10 reps;Seated Hip ABduction/ADduction: AROM;Both;10 reps;Seated Straight Leg Raises: AROM;Both;10 reps;Supine Hip Flexion/Marching: AROM;Both;Seated(30 secs)    General Comments       Pertinent Vitals/Pain Pain Assessment: No/denies pain Pain Intervention(s): Monitored during session    Home Living                      Prior Function            PT Goals (current goals can now be  found in the care plan section) Acute Rehab PT Goals Patient Stated Goal: non discussed PT Goal Formulation: With patient Time For Goal Achievement: 02/25/17 Potential to Achieve Goals: Good Progress towards PT goals: Goals downgraded-see care plan    Frequency    Min 2X/week      PT Plan Current plan remains appropriate    Co-evaluation              AM-PAC PT "6 Clicks" Daily Activity  Outcome Measure  Difficulty turning over in bed  (including adjusting bedclothes, sheets and blankets)?: Unable Difficulty moving from lying on back to sitting on the side of the bed? : Unable Difficulty sitting down on and standing up from a chair with arms (e.g., wheelchair, bedside commode, etc,.)?: A Little Help needed moving to and from a bed to chair (including a wheelchair)?: A Little Help needed walking in hospital room?: A Lot Help needed climbing 3-5 steps with a railing? : Total 6 Click Score: 11    End of Session Equipment Utilized During Treatment: Gait belt;Oxygen Activity Tolerance: Patient limited by fatigue Patient left: with call bell/phone within reach;in chair;with chair alarm set Nurse Communication: Mobility status PT Visit Diagnosis: Muscle weakness (generalized) (M62.81);Difficulty in walking, not elsewhere classified (R26.2)     Time: 1027-2536 PT Time Calculation (min) (ACUTE ONLY): 38 min  Charges:  $Gait Training: 8-22 mins $Therapeutic Exercise: 8-22 mins $Therapeutic Activity: 8-22 mins                    G Codes:       Janna Arch, SPTA   Janna Arch 02/18/2017, 4:56 PM

## 2017-02-19 LAB — MAGNESIUM: MAGNESIUM: 2.1 mg/dL (ref 1.7–2.4)

## 2017-02-19 LAB — BASIC METABOLIC PANEL
ANION GAP: 11 (ref 5–15)
BUN: 28 mg/dL — ABNORMAL HIGH (ref 6–20)
CHLORIDE: 96 mmol/L — AB (ref 101–111)
CO2: 24 mmol/L (ref 22–32)
CREATININE: 1.57 mg/dL — AB (ref 0.61–1.24)
Calcium: 7.6 mg/dL — ABNORMAL LOW (ref 8.9–10.3)
GFR calc non Af Amer: 42 mL/min — ABNORMAL LOW (ref >60)
GFR, EST AFRICAN AMERICAN: 48 mL/min — AB (ref >60)
Glucose, Bld: 125 mg/dL — ABNORMAL HIGH (ref 65–99)
POTASSIUM: 3.1 mmol/L — AB (ref 3.5–5.1)
SODIUM: 131 mmol/L — AB (ref 135–145)

## 2017-02-19 LAB — CBC
HEMATOCRIT: 30.4 % — AB (ref 39.0–52.0)
Hemoglobin: 9.4 g/dL — ABNORMAL LOW (ref 13.0–17.0)
MCH: 29.7 pg (ref 26.0–34.0)
MCHC: 30.9 g/dL (ref 30.0–36.0)
MCV: 95.9 fL (ref 78.0–100.0)
Platelets: 159 10*3/uL (ref 150–400)
RBC: 3.17 MIL/uL — AB (ref 4.22–5.81)
RDW: 19.1 % — AB (ref 11.5–15.5)
WBC: 10.2 10*3/uL (ref 4.0–10.5)

## 2017-02-19 MED ORDER — POTASSIUM CHLORIDE CRYS ER 20 MEQ PO TBCR
40.0000 meq | EXTENDED_RELEASE_TABLET | Freq: Four times a day (QID) | ORAL | Status: AC
  Administered 2017-02-19 (×2): 40 meq via ORAL
  Filled 2017-02-19 (×2): qty 2

## 2017-02-19 MED ORDER — FUROSEMIDE 10 MG/ML IJ SOLN
80.0000 mg | Freq: Once | INTRAMUSCULAR | Status: AC
  Administered 2017-02-19: 80 mg via INTRAVENOUS
  Filled 2017-02-19: qty 8

## 2017-02-19 MED ORDER — POTASSIUM CHLORIDE CRYS ER 20 MEQ PO TBCR: 40.0000 meq | EXTENDED_RELEASE_TABLET | Freq: Four times a day (QID) | ORAL | Status: DC

## 2017-02-19 MED ORDER — FUROSEMIDE 10 MG/ML IJ SOLN
80.0000 mg | Freq: Once | INTRAMUSCULAR | Status: AC
  Administered 2017-02-20: 80 mg via INTRAVENOUS
  Filled 2017-02-19: qty 8

## 2017-02-19 MED ORDER — APIXABAN 5 MG PO TABS
5.0000 mg | ORAL_TABLET | Freq: Two times a day (BID) | ORAL | Status: DC
  Administered 2017-02-20 – 2017-02-26 (×13): 5 mg via ORAL
  Filled 2017-02-19 (×13): qty 1

## 2017-02-19 MED ORDER — DILTIAZEM HCL ER COATED BEADS 120 MG PO CP24
120.0000 mg | ORAL_CAPSULE | Freq: Every day | ORAL | Status: DC
  Administered 2017-02-19 – 2017-02-24 (×6): 120 mg via ORAL
  Filled 2017-02-19 (×7): qty 1

## 2017-02-19 MED ORDER — SALINE SPRAY 0.65 % NA SOLN
1.0000 | NASAL | Status: DC | PRN
  Filled 2017-02-19: qty 44

## 2017-02-19 MED ORDER — POTASSIUM CHLORIDE CRYS ER 20 MEQ PO TBCR
40.0000 meq | EXTENDED_RELEASE_TABLET | Freq: Once | ORAL | Status: AC
  Administered 2017-02-19: 40 meq via ORAL
  Filled 2017-02-19: qty 2

## 2017-02-19 MED ORDER — METOPROLOL TARTRATE 12.5 MG HALF TABLET
12.5000 mg | ORAL_TABLET | Freq: Three times a day (TID) | ORAL | Status: DC
  Administered 2017-02-19 – 2017-02-24 (×8): 12.5 mg via ORAL
  Filled 2017-02-19 (×13): qty 1

## 2017-02-19 NOTE — Progress Notes (Signed)
Patient ID: [name], male   DOB: 1940/10/21, 76 y.o.   MRN: [id number]     Advanced Heart Failure Rounding Note  Primary Cardiologist:  Dr. Einar Gip  Reason for Consultation: HOCM/CHF  Subjective:    Initially on phenylephrine, now stopped.  Seen by Dr. Prescott Gum 02/11/17. Too debilitated for surgery at this time.  11/9 had torsades requiring defibrillation and brief CPR.  Disopyramide stopped as likely precipitating torsades.   Device interrogated at bedside with Dr. Caryl Comes on 11/10.  Echo guidance used for device optimization.  At baseline, LV lead was off, and patient was RV pacing only.  Peak LVOT gradient 190 mmHg with RV pacing.  We changed device settings to BiV pacing with fall in LVOT gradient to 160 mmHg.  With LV only pacing, LVOT gradient was 150 mmHg.  We left him with LV only pacing.   Yesterday diuresed with IV lasix twice a day. CVP down to 11. Last night metoprolol and diltiazem held due to hypotension.   Complaining of cough. Able to stand to get in the chair.    L/RHC: RA mean 12 PA 76/25 PCWP mean 20  CI 1.6 Mean aortic valve gradient 25 mmHg Peak to peak LV apex-aorta gradient 178 mmHg  Echo: LV EF 65-70%, moderate to severe LVH with LVOT/AoV gradient (very high gradient across aortic valve, likely contribution from moderate LVH and severe LVOT gradient).  Moderate AS and moderate AI.  Moderate to severe MR.  Moderate TR.  PASP 72 mmHg.   Objective:   Weight Range: 168 lb 6.9 oz (76.4 kg) Body mass index is 22.84 kg/m.   Vital Signs:   Temp:  [98.1 F (36.7 C)-99 F (37.2 C)] 98.6 F (37 C) (11/16 0419) Pulse Rate:  [43-63] 59 (11/16 0625) Resp:  [13-24] 17 (11/16 0625) BP: (70-108)/(52-77) 96/65 (11/16 0625) SpO2:  [93 %-99 %] 98 % (11/16 0625) Weight:  [168 lb 6.9 oz (76.4 kg)] 168 lb 6.9 oz (76.4 kg) (11/16 0530) Last BM Date: 02/17/17  Weight change: Filed Weights   02/17/17 0500 02/18/17 0500 02/19/17 0530  Weight: 191 lb 5.8 oz  (86.8 kg) 187 lb 9.8 oz (85.1 kg) 168 lb 6.9 oz (76.4 kg)   Intake/Output:   Intake/Output Summary (Last 24 hours) at 02/19/2017 0707 Last data filed at 02/19/2017 0200 Gross per 24 hour  Intake 540 ml  Output 1550 ml  Net -1010 ml    Physical Exam  CVP 11 General:  Pale. No resp difficulty HEENT: normal Neck: supple. JVP ~10. Carotids 2+ bilat; no bruits. No lymphadenopathy or thryomegaly appreciated. RIJ  Cor: PMI nondisplaced. Regular rate & rhythm. No rubs, gallops. 3/6 HSM apex SEM RUSB.  Lungs: Crackles in the bases.  Abdomen: soft, nontender, nondistended. No hepatosplenomegaly. No bruits or masses. Good bowel sounds. Extremities: no cyanosis, clubbing, rash, R and LLE 2+ edema ted hose.  Neuro: alert & orientedx3, cranial nerves grossly intact. moves all 4 extremities w/o difficulty. Affect pleasant    Telemetry   V pacing 60 bpm personally reviewed.    Labs    CBC Recent Labs    02/18/17 0454 02/19/17 0259  WBC 12.3* 10.2  HGB 9.2* 9.4*  HCT 30.5* 30.4*  MCV 96.5 95.9  PLT 167 409   Basic Metabolic Panel Recent Labs    02/18/17 0454 02/19/17 0259  NA 133* 131*  K 3.2* 3.1*  CL 97* 96*  CO2 26 24  GLUCOSE 129* 125*  BUN 26* 28*  CREATININE 1.43* 1.57*  CALCIUM 7.7* 7.6*  MG 1.8 2.1   Liver Function Tests No results for input(s): AST, ALT, ALKPHOS, BILITOT, PROT, ALBUMIN in the last 72 hours. No results for input(s): LIPASE, AMYLASE in the last 72 hours. Cardiac Enzymes No results for input(s): CKTOTAL, CKMB, CKMBINDEX, TROPONINI in the last 72 hours.  BNP: BNP (last 3 results) No results for input(s): BNP in the last 8760 hours.  ProBNP (last 3 results) No results for input(s): PROBNP in the last 8760 hours.  D-Dimer No results for input(s): DDIMER in the last 72 hours. Hemoglobin A1C No results for input(s): HGBA1C in the last 72 hours. Fasting Lipid Panel No results for input(s): CHOL, HDL, LDLCALC, TRIG, CHOLHDL, LDLDIRECT in the  last 72 hours. Thyroid Function Tests No results for input(s): TSH, T4TOTAL, T3FREE, THYROIDAB in the last 72 hours.  Invalid input(s): FREET3  Other results:   Imaging   No results found.  Medications:    Scheduled Medications: . Chlorhexidine Gluconate Cloth  6 each Topical Daily  . diltiazem  60 mg Oral Q6H  . docusate sodium  100 mg Oral Daily  . enoxaparin (LOVENOX) injection  40 mg Subcutaneous Q24H  . furosemide  80 mg Intravenous BID  . metoprolol tartrate  12.5 mg Oral Q6H  . midodrine  15 mg Oral TID WC  . pantoprazole  40 mg Oral BID  . sodium chloride flush  10-40 mL Intracatheter Q12H  . sodium chloride flush  3 mL Intravenous Q12H    Infusions: . sodium chloride Stopped (02/17/17 1200)    PRN Medications: sodium chloride, acetaminophen, albuterol, ipratropium, LORazepam, morphine CONCENTRATE, ondansetron (ZOFRAN) IV, simethicone, sodium chloride flush, sodium chloride flush  Patient Profile   [name] is a 76 y.o. 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   Admitted with lightheadedness 2/2 blood loss anemia with GI bleed. Cardiology consulted with worsening SOB. HF team consulted with severe HOCM and cardiogenic shock.   Assessment/Plan   1. Cardiogenic shock in setting of HOCM: Complicated anatomy/physiology with probably moderate aortic stenosis and moderate aortic insufficiency, severe LVOT gradient from HOCM, and moderate to severe MR.  SBP in 80s-90s and co-ox had been consistently very low (though not able to draw from line).  He was initially on phenylephrine, now off.  Disopyramide attempted but had torsades thought to be related to disopyramide and this was stopped.     Pacemaker adjusted with echo guidance by  Dr. Caryl Comes, he was left with LV only pacing (lowest LVOT gradient in this mode).   - Seen by Dr. Prescott Gum.  Ideally would have myectomy + aortic  valve replacement + mitral valve repair but not stable for this and seems to have very poor reserve.   - Will try and diurese gently despite HOCM.  CVP down a little 20>15>11. Give one dose 80 mg IV lasix.   -Renal function slowly trending up.    Continue UNNA boots  - Continue higher dose of midodrine, 15 mg tid.  - Cut back metoprolol 12.5 mg every 8 hours. Continue current dose of diltiazem.  - Off disopyramide with torsades.  - Pacer reprogrammed yesterday due to episodes of atrial tach 2. GI bleed: Fairly stable hemoglobin.  Got feraheme 11/8.  Hgb 9.4  3. Chronic Afib: s/p AV nodal ablation with BiV pacing. No evidence of bleeding. Restart eliquis tomorrow if no bleeding.   -On  lovenox. - Pacer interrogated 11/12 and showed 70% atrial tach and 30% NSR. Device reprogrammed to promote V pacing 60 bpm.  4. CAD:  Coronary angiography 11/16 with patent ostial and mid LAD stents.  No S/S ischemia.  5. VT: Torsades on 11/9, thought to be due to disopyramide which has been stopped. No further arrhythmia.  Stable on metoprolol  K3.1 Supp K.  Mag 2.1    6. Frailty/deconditioning: PT recommending snf.   Disposition: SNF    Palliative Care following.     Length of Stay: [place], NP  02/19/2017, 7:07 AM  Advanced Heart Failure Team Pager [phone] (M-F; 7a - 4p)  Please contact [facility] for night-coverage after hours (4p -7a ) and weekends on [email or web address]  Patient seen and examined with Darrick Grinder, NP. We discussed all aspects of the encounter. I agree with the assessment and plan as stated above.   Volume status improving but still with marked edema. BP very tenuous in setting of end-stage HOCM. Will cut diltiazem to 30 q 6. Will give one more dose IV lasix as BP tolerates. Supp K+. Now up in the chair. Will continue rehab. Palliative Care has seen and discussions ongoing.   Likely plan d/c to SNF +/- Hospice following.  Glori Bickers, MD  2:13 PM

## 2017-02-19 NOTE — Progress Notes (Signed)
Daily Progress Note   Patient Name: [name]       Date: 02/19/2017 DOB: 1941-03-12  Age: 76 y.o. MRN#: [id number] Attending Physician: Nita Sells, MD Primary Care Physician: [name], MD Admit Date: 02/01/2017  Reason for Consultation/Follow-up: Establishing goals of care  Subjective:  Sitting up in chair today. Ambulated to the door with PT. He is encouraged by this. Breathing well on room air.  States he has thought about conversation from yesterday. States he was encouraged to hear Dr. Haroldine Laws say "his heart is strong".  He has made decision he would like to continue full scope of care, full PT, and try for surgery. He notes that after having his pacemaker placed "it was difficult, but I did it". He says he would rather "die trying" than "do nothing". He also discusses his brother who he feels "didn't work hard enough" to try and live- he says he believes if his brother had "let them do one more procedure" then he would have lived longer.  Patient states he is not ready for transition to Hospice and comfort care. He would like to d/c to SNF for rehab with [place] being eventual surgery. Advance directive packet was reviewed and completed with patient. He wishes to remain full code status.   .Review of Systems  Constitutional: Positive for chills.  Respiratory: Positive for cough and shortness of breath.   Cardiovascular: Positive for leg swelling.  Psychiatric/Behavioral: The patient is nervous/anxious.     Length of Stay: 18  Current Medications: Scheduled Meds:  . Chlorhexidine Gluconate Cloth  6 each Topical Daily  . diltiazem  60 mg Oral Q6H  . docusate sodium  100 mg Oral Daily  . enoxaparin (LOVENOX) injection  40 mg Subcutaneous Q24H  . metoprolol  tartrate  12.5 mg Oral Q8H  . midodrine  15 mg Oral TID WC  . pantoprazole  40 mg Oral BID  . potassium chloride  40 mEq Oral Once  . sodium chloride flush  10-40 mL Intracatheter Q12H  . sodium chloride flush  3 mL Intravenous Q12H    Continuous Infusions: . sodium chloride Stopped (02/17/17 1200)    PRN Meds: sodium chloride, acetaminophen, albuterol, ipratropium, LORazepam, morphine CONCENTRATE, ondansetron (ZOFRAN) IV, simethicone, sodium chloride flush, sodium chloride flush  Physical Exam  Constitutional: He is oriented  to person, place, and time. He appears well-developed and well-nourished. No distress.  Cardiovascular: Normal rate.  Pulmonary/Chest: Effort normal.  Neurological: He is alert and oriented to person, place, and time.  Skin: Skin is warm and dry. There is pallor.  Psychiatric: He has a normal mood and affect. His behavior is normal. Judgment and thought content normal.  Nursing note and vitals reviewed.           Vital Signs: BP 106/70   Pulse 60   Temp 97.8 F (36.6 C) (Oral)   Resp (!) 22   Ht 6' (1.829 m)   Wt 76.4 kg (168 lb 6.9 oz)   SpO2 95%   BMI 22.84 kg/m  SpO2: SpO2: 95 % O2 Device: O2 Device: Nasal Cannula O2 Flow Rate: O2 Flow Rate (L/min): 2 L/min  Intake/output summary:   Intake/Output Summary (Last 24 hours) at 02/19/2017 1358 Last data filed at 02/19/2017 0600 Gross per 24 hour  Intake 490 ml  Output 650 ml  Net -160 ml   LBM: Last BM Date: 02/17/17 Baseline Weight: Weight: 80.4 kg (177 lb 4.8 oz) Most recent weight: Weight: 76.4 kg (168 lb 6.9 oz)       Palliative Assessment/Data: PPS: 30%      Patient Active Problem List   Diagnosis Date Noted  . Pressure injury of skin 02/17/2017  . Shortness of breath   . Advance care planning   . Palliative care by specialist   . Goals of care, counseling/discussion   . Torsades de pointes ([place])   . Acute on chronic diastolic CHF (congestive heart failure) ([place]) 02/06/2017    . GI bleed 02/01/2017  . Atrial fibrillation ([place]) 10/22/2016  . Typical atrial flutter ([place]) 08/12/2016  . Atrial flutter ([place]) 08/12/2016  . Hypotension 05/06/2016  . Atrial fibrillation with RVR ([place]) 05/06/2016  . AKI (acute kidney injury) ([place]) 05/06/2016  . SVT (supraventricular tachycardia) ([place]) 01/31/2015  . Colonic mass, right colon. 11/10/2011  . Acute bacterial endocarditis 11/06/2011  . HTN (hypertension) 11/06/2011  . BPH (benign prostatic hyperplasia) 11/06/2011  . 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 Back pain 11/06/2011  . Hyperlipidemia 11/06/2011  . Acute renal insufficiency 08/25/2010  . Viridans streptococci infection 08/25/2010  . Streptococcus infection 08/25/2010  . Tooth infection 08/25/2010  . Status post PICC central line placement 08/25/2010  . Arm pain, right 08/25/2010  . ENDOCARDITIS, BACTERIAL, SUBACUTE 05/07/2010  . Iron deficiency anemia 05/06/2010  . Hypertrophic obstructive cardiomyopathy ([place]) 05/06/2010  . SYNCOPE 05/06/2010  . DYSPNEA ON EXERTION 05/06/2010  . ECHOCARDIOGRAM, ABNORMAL 05/06/2010  . HEART MURMUR, HX OF 05/06/2010    Palliative Care Assessment & Plan   Patient Profile: 76 y.o. male  with past medical history of a. Fib ( s/p ablation and pacemaker placement, was on eliquis, now on hold d/t GI bleed s/p colonoscopy), HTN, CHF, aortic stenosis, admitted on 02/01/2017 with complaints of weakness and black tarry stool. Workup revealed GI bleed s/p colonoscopy with biopsy, anemia with hgb down to 8.9. He received transfusions. During admission he developed pulmonary edema and CHF exacerbation d/t fluid overload. 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 He has undergone pacemaker reprogramming and IV pressors (which have been weaned). 11/9 had an episode of Torsades and brief period of unresponsiveness, requiring chest compression and  electric shock x 1. Continues to be diuresed by lasix, Cr is starting to trend up. Remains SOB with any  effort. Continues to be hypotensive- reports this is normal for him. Ideally, he would need myectomy and valve replacements, however, at this point he is not stable for surgery and poor indication he would be able to regain status to be able to tolerate. Palliative medicine consulted for [place].      Assessment/Recommendations/Plan   Continue full scope care  PMT will cont to follow peripherally and monitor decline and intervene if needed- please reconsult if needed sooner  Recommend outpatient palliative follow at d/c for continued GOC- great concern pt will decline rapidly at d/c and be readmitted  Code Status:  Full code  Prognosis:   < 3 months  Discharge Planning:  To Be Determined  Care plan was discussed with patient.  Thank you for allowing the Palliative Medicine Team to assist in the care of this patient.   Time In: 1230 Time Out: 1330 Total Time 60 minutes Prolong Time billed NO      Greater than 50%  of this time was spent counseling and coordinating care related to the above assessment and plan.  Mariana Kaufman, AGNP-C Palliative Medicine   Please contact Palliative Medicine Team phone at [phone] for questions and concerns.

## 2017-02-19 NOTE — Progress Notes (Signed)
Completion of Advanced Directive.  Copy on chart and original given to spouse who is at bedside.    02/19/17 1459  Clinical Encounter Type  Visited With Patient;Family  Visit Type Follow-up  Spiritual Encounters  Spiritual Needs Literature  Advance Directives (For Healthcare)  Does Patient Have a Medical Advance Directive? Yes

## 2017-02-20 LAB — MAGNESIUM: Magnesium: 2 mg/dL (ref 1.7–2.4)

## 2017-02-20 LAB — CBC
HCT: 30.7 % — ABNORMAL LOW (ref 39.0–52.0)
HEMOGLOBIN: 9.5 g/dL — AB (ref 13.0–17.0)
MCH: 29.8 pg (ref 26.0–34.0)
MCHC: 30.9 g/dL (ref 30.0–36.0)
MCV: 96.2 fL (ref 78.0–100.0)
PLATELETS: 171 10*3/uL (ref 150–400)
RBC: 3.19 MIL/uL — AB (ref 4.22–5.81)
RDW: 18.6 % — ABNORMAL HIGH (ref 11.5–15.5)
WBC: 9.2 10*3/uL (ref 4.0–10.5)

## 2017-02-20 LAB — BASIC METABOLIC PANEL
ANION GAP: 9 (ref 5–15)
BUN: 29 mg/dL — AB (ref 6–20)
CHLORIDE: 98 mmol/L — AB (ref 101–111)
CO2: 26 mmol/L (ref 22–32)
Calcium: 7.7 mg/dL — ABNORMAL LOW (ref 8.9–10.3)
Creatinine, Ser: 1.4 mg/dL — ABNORMAL HIGH (ref 0.61–1.24)
GFR calc Af Amer: 56 mL/min — ABNORMAL LOW (ref >60)
GFR, EST NON AFRICAN AMERICAN: 48 mL/min — AB (ref >60)
Glucose, Bld: 121 mg/dL — ABNORMAL HIGH (ref 65–99)
POTASSIUM: 3.8 mmol/L (ref 3.5–5.1)
Sodium: 133 mmol/L — ABNORMAL LOW (ref 135–145)

## 2017-02-20 LAB — COOXEMETRY PANEL
CARBOXYHEMOGLOBIN: 1.6 % — AB (ref 0.5–1.5)
Methemoglobin: 1 % (ref 0.0–1.5)
O2 SAT: 51.5 %
Total hemoglobin: 9.4 g/dL — ABNORMAL LOW (ref 12.0–16.0)

## 2017-02-20 MED ORDER — POTASSIUM CHLORIDE CRYS ER 10 MEQ PO TBCR
20.0000 meq | EXTENDED_RELEASE_TABLET | Freq: Two times a day (BID) | ORAL | Status: DC
  Administered 2017-02-20 – 2017-02-21 (×3): 20 meq via ORAL
  Filled 2017-02-20 (×5): qty 2

## 2017-02-20 MED ORDER — ACETAMINOPHEN 160 MG/5ML PO SOLN
650.0000 mg | Freq: Four times a day (QID) | ORAL | Status: DC | PRN
  Administered 2017-02-20: 650 mg via ORAL
  Filled 2017-02-20: qty 20.3

## 2017-02-20 MED ORDER — FUROSEMIDE 10 MG/ML IJ SOLN
80.0000 mg | Freq: Two times a day (BID) | INTRAMUSCULAR | Status: DC
  Administered 2017-02-20 – 2017-02-22 (×5): 80 mg via INTRAVENOUS
  Filled 2017-02-20 (×7): qty 8

## 2017-02-20 NOTE — Progress Notes (Signed)
Patient ID: [name], male   DOB: 1941-01-31, 76 y.o.   MRN: [id number]     Advanced Heart Failure Rounding Note  Primary Cardiologist:  Dr. Einar Gip  Reason for Consultation: HOCM/CHF  Subjective:    Initially on phenylephrine, now stopped.  Seen by Dr. Prescott Gum 02/11/17. Too debilitated for surgery at this time.  11/9 had torsades requiring defibrillation and brief CPR.  Disopyramide stopped as likely precipitating torsades.   Device interrogated at bedside with Dr. Caryl Comes on 11/10.  Echo guidance used for device optimization.  At baseline, LV lead was off, and patient was RV pacing only.  Peak LVOT gradient 190 mmHg with RV pacing.  We changed device settings to BiV pacing with fall in LVOT gradient to 160 mmHg.  With LV only pacing, LVOT gradient was 150 mmHg.  We left him with LV only pacing.   Remains on IV lasix 80 daily. Diltiazem cut back due to hypotension. Breathing better. Feels weak. Feels edema is worse. Able to ambulate room some yesterday with PT. SBP 90-100 range   L/RHC: RA mean 12 PA 76/25 PCWP mean 20  CI 1.6 Mean aortic valve gradient 25 mmHg Peak to peak LV apex-aorta gradient 178 mmHg  Echo: LV EF 65-70%, moderate to severe LVH with LVOT/AoV gradient (very high gradient across aortic valve, likely contribution from moderate LVH and severe LVOT gradient).  Moderate AS and moderate AI.  Moderate to severe MR.  Moderate TR.  PASP 72 mmHg.   Objective:   Weight Range: 84.6 kg (186 lb 8.2 oz) Body mass index is 25.3 kg/m.   Vital Signs:   Temp:  [97.8 F (36.6 C)-98.6 F (37 C)] 97.8 F (36.6 C) (11/17 0721) Pulse Rate:  [58-65] 59 (11/17 0800) Resp:  [14-23] 18 (11/17 0800) BP: (85-130)/(50-85) 97/59 (11/17 0800) SpO2:  [94 %-100 %] 99 % (11/17 0800) Weight:  [84.6 kg (186 lb 8.2 oz)] 84.6 kg (186 lb 8.2 oz) (11/17 0500) Last BM Date: 02/19/17  Weight change: Filed Weights   02/18/17 0500 02/19/17 0530 02/20/17 0500  Weight: 85.1 kg (187 lb  9.8 oz) 76.4 kg (168 lb 6.9 oz) 84.6 kg (186 lb 8.2 oz)   Intake/Output:   Intake/Output Summary (Last 24 hours) at 02/20/2017 0924 Last data filed at 02/20/2017 0000 Gross per 24 hour  Intake 240 ml  Output 1055 ml  Net -815 ml    Physical Exam  CVP 12 General:  Sitting in chair. Pale. NAD HEENT: normal anicteric Neck: supple. JVP to jaw. Carotids 2+ bilat; + bialteral bruits. No lymphadenopathy or thryomegaly appreciated. RIJ line Cor: PMI nondisplaced. Regular rate & rhythm. 3/6 SEM across precordium  Lungs: Clear. Dull in bases  Abdomen: soft, NT/ND No hepatosplenomegaly. No bruits or masses. Good BS Extremities: no cyanosis, clubbing, rash, pale. 3+ edema in thighs. No rash Neuro: alert & oriented x 3, cranial nerves grossly intact. moves all 4 extremities w/o difficulty. Affect pleasant   Telemetry   V pacing 60 bpm personally reviewed.    Labs    CBC Recent Labs    02/19/17 0259 02/20/17 0420  WBC 10.2 9.2  HGB 9.4* 9.5*  HCT 30.4* 30.7*  MCV 95.9 96.2  PLT 159 585   Basic Metabolic Panel Recent Labs    02/19/17 0259 02/20/17 0420  NA 131* 133*  K 3.1* 3.8  CL 96* 98*  CO2 24 26  GLUCOSE 125* 121*  BUN 28* 29*  CREATININE 1.57* 1.40*  CALCIUM 7.6*  7.7*  MG 2.1 2.0   Liver Function Tests No results for input(s): AST, ALT, ALKPHOS, BILITOT, PROT, ALBUMIN in the last 72 hours. No results for input(s): LIPASE, AMYLASE in the last 72 hours. Cardiac Enzymes No results for input(s): CKTOTAL, CKMB, CKMBINDEX, TROPONINI in the last 72 hours.  BNP: BNP (last 3 results) No results for input(s): BNP in the last 8760 hours.  ProBNP (last 3 results) No results for input(s): PROBNP in the last 8760 hours.  D-Dimer No results for input(s): DDIMER in the last 72 hours. Hemoglobin A1C No results for input(s): HGBA1C in the last 72 hours. Fasting Lipid Panel No results for input(s): CHOL, HDL, LDLCALC, TRIG, CHOLHDL, LDLDIRECT in the last 72  hours. Thyroid Function Tests No results for input(s): TSH, T4TOTAL, T3FREE, THYROIDAB in the last 72 hours.  Invalid input(s): FREET3  Other results:   Imaging   No results found.  Medications:    Scheduled Medications: . apixaban  5 mg Oral BID  . Chlorhexidine Gluconate Cloth  6 each Topical Daily  . diltiazem  120 mg Oral QHS  . docusate sodium  100 mg Oral Daily  . metoprolol tartrate  12.5 mg Oral Q8H  . midodrine  15 mg Oral TID WC  . pantoprazole  40 mg Oral BID  . sodium chloride flush  10-40 mL Intracatheter Q12H  . sodium chloride flush  3 mL Intravenous Q12H    Infusions: . sodium chloride Stopped (02/17/17 1200)    PRN Medications: sodium chloride, acetaminophen, albuterol, ipratropium, LORazepam, morphine CONCENTRATE, ondansetron (ZOFRAN) IV, simethicone, sodium chloride, sodium chloride flush, sodium chloride flush  Patient Profile   [name] is a 76 y.o. 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   Admitted with lightheadedness 2/2 blood loss anemia with GI bleed. Cardiology consulted with worsening SOB. HF team consulted with severe HOCM and cardiogenic shock.   Assessment/Plan   1. Cardiogenic shock in setting of HOCM: Complicated anatomy/physiology with probably moderate aortic stenosis and moderate aortic insufficiency, severe LVOT gradient from HOCM, and moderate to severe MR.  SBP in 80s-90s and co-ox had been consistently very low (though not able to draw from line).  He was initially on phenylephrine, now off.  Disopyramide attempted but had torsades thought to be related to disopyramide and this was stopped.     Pacemaker adjusted with echo guidance by  Dr. Caryl Comes, he was left with LV only pacing (lowest LVOT gradient in this mode).   - Seen by Dr. Prescott Gum.  Ideally would have myectomy + aortic valve replacement + mitral valve repair but not stable for  this and seems to have very poor reserve.   - Volume status improving slowly but still 16 pounds up from baseline. CVP 12 Will need further diuresis but need to do gently in setting of HOCM. Increase lasix to 80 IV bid. Can switch to lasix gtt as needed for slower and steady diuresis - Renal function stable today - Did not tolerate UNNA boots. Continue TED hose.   - Continue higher dose of midodrine, 15 mg tid.  - Continue metoprolol and diltiazem at current doses. Did not tolerate higher doses - Off disopyramide with torsades.  - Pacer reprogrammed due to episodes of atrial tach 2. GI bleed: Fairly hemoglobin.  Got feraheme 11/8.  Hgb 9.4  3. Chronic Afib: s/p AV nodal ablation with BiV pacing. -No evidence of bleeding.  -  Restart Eliquis today - Pacer interrogated 11/12 and showed 70% atrial tach and 30% NSR. Device reprogrammed to promote V pacing 60 bpm.  4. CAD:   -No s.s ischemia - Coronary angiography 11/16 with patent ostial and mid LAD stents.    5. VT:  -Torsades on 11/9,  due to disopyramide which has been stopped. No further arrhythmia.  Stable on metoprolol  - Keep K > 4.0 Mg >2.0  6. Frailty/deconditioning: PT recommending snf.   Can transfer to SDU. Can got o SNF when volume status improved.    Palliative Care following.     Length of Stay: [place], MD  02/20/2017, 9:24 AM  Advanced Heart Failure Team Pager [phone] (M-F; 7a - 4p)  Please contact [facility] for night-coverage after hours (4p -7a ) and weekends on [email or web address]

## 2017-02-21 LAB — URINALYSIS, ROUTINE W REFLEX MICROSCOPIC
BILIRUBIN URINE: NEGATIVE
GLUCOSE, UA: NEGATIVE mg/dL
HGB URINE DIPSTICK: NEGATIVE
Ketones, ur: NEGATIVE mg/dL
Leukocytes, UA: NEGATIVE
NITRITE: NEGATIVE
PH: 5 (ref 5.0–8.0)
Protein, ur: NEGATIVE mg/dL
SPECIFIC GRAVITY, URINE: 1.013 (ref 1.005–1.030)

## 2017-02-21 LAB — MAGNESIUM: MAGNESIUM: 2 mg/dL (ref 1.7–2.4)

## 2017-02-21 LAB — BASIC METABOLIC PANEL
Anion gap: 9 (ref 5–15)
BUN: 28 mg/dL — AB (ref 6–20)
CHLORIDE: 97 mmol/L — AB (ref 101–111)
CO2: 27 mmol/L (ref 22–32)
CREATININE: 1.4 mg/dL — AB (ref 0.61–1.24)
Calcium: 7.8 mg/dL — ABNORMAL LOW (ref 8.9–10.3)
GFR calc Af Amer: 56 mL/min — ABNORMAL LOW (ref >60)
GFR calc non Af Amer: 48 mL/min — ABNORMAL LOW (ref >60)
GLUCOSE: 114 mg/dL — AB (ref 65–99)
Potassium: 3.4 mmol/L — ABNORMAL LOW (ref 3.5–5.1)
Sodium: 133 mmol/L — ABNORMAL LOW (ref 135–145)

## 2017-02-21 LAB — COOXEMETRY PANEL
CARBOXYHEMOGLOBIN: 1.9 % — AB (ref 0.5–1.5)
Methemoglobin: 0.9 % (ref 0.0–1.5)
O2 SAT: 44.3 %
TOTAL HEMOGLOBIN: 9.5 g/dL — AB (ref 12.0–16.0)

## 2017-02-21 MED ORDER — POTASSIUM CHLORIDE CRYS ER 20 MEQ PO TBCR
40.0000 meq | EXTENDED_RELEASE_TABLET | Freq: Two times a day (BID) | ORAL | Status: DC
  Administered 2017-02-21: 40 meq via ORAL
  Filled 2017-02-21: qty 2

## 2017-02-21 MED ORDER — POTASSIUM CHLORIDE CRYS ER 20 MEQ PO TBCR
40.0000 meq | EXTENDED_RELEASE_TABLET | Freq: Once | ORAL | Status: AC
  Administered 2017-02-21: 40 meq via ORAL
  Filled 2017-02-21: qty 2

## 2017-02-21 MED ORDER — METOLAZONE 2.5 MG PO TABS
2.5000 mg | ORAL_TABLET | Freq: Once | ORAL | Status: AC
  Administered 2017-02-21: 2.5 mg via ORAL
  Filled 2017-02-21: qty 1

## 2017-02-21 NOTE — Progress Notes (Signed)
Transferred to 4 E via w/c with all personal belongings. Transferred with telemetry.

## 2017-02-21 NOTE — Progress Notes (Signed)
Report called to 4 E Patient aware of room number

## 2017-02-21 NOTE — Progress Notes (Signed)
Right IJ pulled per policy, patient could only tolerate 20 angle versus flat. Pressure held lightly until hemostasis achieved. Vaseline and 4x4 dressing applied. Small skin tag/ ingrown hair noted at old  suture site.

## 2017-02-21 NOTE — Progress Notes (Signed)
Patient transferred from [facility] A&Ox3, VSS. Telemetry applied and CCMD notified.

## 2017-02-21 NOTE — Progress Notes (Signed)
Patient ID: [name], male   DOB: 12-22-1940, 76 y.o.   MRN: [id number]     Advanced Heart Failure Rounding Note  Primary Cardiologist:  Dr. Einar Gip  Reason for Consultation: HOCM/CHF  Subjective:    Seen by Dr. Prescott Gum 02/11/17. Too debilitated for surgery at this time.  11/9 had torsades requiring defibrillation and brief CPR.  Disopyramide stopped as likely precipitating torsades.   Device interrogated at bedside with Dr. Caryl Comes on 11/10.  Echo guidance used for device optimization.  At baseline, LV lead was off, and patient was RV pacing only.  Peak LVOT gradient 190 mmHg with RV pacing.  We changed device settings to BiV pacing with fall in LVOT gradient to 160 mmHg.  With LV only pacing, LVOT gradient was 150 mmHg.  We left him with LV only pacing.   IV lasix increased to 80 IV bid yesterday. Weight down 1 pound. Breathing better but still feels edematous and bloated. Very weak. BP stable ~90s.    L/RHC: RA mean 12 PA 76/25 PCWP mean 20  CI 1.6 Mean aortic valve gradient 25 mmHg Peak to peak LV apex-aorta gradient 178 mmHg  Echo: LV EF 65-70%, moderate to severe LVH with LVOT/AoV gradient (very high gradient across aortic valve, likely contribution from moderate LVH and severe LVOT gradient).  Moderate AS and moderate AI.  Moderate to severe MR.  Moderate TR.  PASP 72 mmHg.   Objective:   Weight Range: 185 lb 3 oz (84 kg) Body mass index is 25.12 kg/m.   Vital Signs:   Temp:  [97.9 F (36.6 C)-99.2 F (37.3 C)] 97.9 F (36.6 C) (11/18 1107) Pulse Rate:  [54-61] 59 (11/18 1100) Resp:  [14-24] 19 (11/18 1100) BP: (80-125)/(56-69) 97/62 (11/18 1100) SpO2:  [90 %-100 %] 99 % (11/18 1100) Weight:  [185 lb 3 oz (84 kg)] 185 lb 3 oz (84 kg) (11/18 0607) Last BM Date: 02/20/17  Weight change: Filed Weights   02/19/17 0530 02/20/17 0500 02/21/17 0607  Weight: 168 lb 6.9 oz (76.4 kg) 186 lb 8.2 oz (84.6 kg) 185 lb 3 oz (84 kg)   Intake/Output:   Intake/Output  Summary (Last 24 hours) at 02/21/2017 1117 Last data filed at 02/21/2017 1000 Gross per 24 hour  Intake 540 ml  Output 885 ml  Net -345 ml    Physical Exam  CVP 13 General:  Sitting in chair. Weak. Pale. No resp difficulty HEENT: normal Neck: supple. JVP to jaw . RIJ cordis  Carotids 2+ bilat; + bruits. No lymphadenopathy or thryomegaly appreciated. Cor: PMI nondisplaced. Regular rate & rhythm. 3/6 SEM across precordium Lungs: clear dull at bases Abdomen: soft, nontender, nondistended. No hepatosplenomegaly. No bruits or masses. Good bowel sounds. Extremities: no cyanosis, clubbing, rash, 2-3+ edema in thighs. + TED hose Neuro: alert & orientedx3, cranial nerves grossly intact. moves all 4 extremities w/o difficulty. Affect pleasant    Telemetry   V pacing 60 bpm personally reviewed.    Labs    CBC Recent Labs    02/19/17 0259 02/20/17 0420  WBC 10.2 9.2  HGB 9.4* 9.5*  HCT 30.4* 30.7*  MCV 95.9 96.2  PLT 159 259   Basic Metabolic Panel Recent Labs    02/20/17 0420 02/21/17 0341  NA 133* 133*  K 3.8 3.4*  CL 98* 97*  CO2 26 27  GLUCOSE 121* 114*  BUN 29* 28*  CREATININE 1.40* 1.40*  CALCIUM 7.7* 7.8*  MG 2.0 2.0   Liver Function  Tests No results for input(s): AST, ALT, ALKPHOS, BILITOT, PROT, ALBUMIN in the last 72 hours. No results for input(s): LIPASE, AMYLASE in the last 72 hours. Cardiac Enzymes No results for input(s): CKTOTAL, CKMB, CKMBINDEX, TROPONINI in the last 72 hours.  BNP: BNP (last 3 results) No results for input(s): BNP in the last 8760 hours.  ProBNP (last 3 results) No results for input(s): PROBNP in the last 8760 hours.  D-Dimer No results for input(s): DDIMER in the last 72 hours. Hemoglobin A1C No results for input(s): HGBA1C in the last 72 hours. Fasting Lipid Panel No results for input(s): CHOL, HDL, LDLCALC, TRIG, CHOLHDL, LDLDIRECT in the last 72 hours. Thyroid Function Tests No results for input(s): TSH, T4TOTAL,  T3FREE, THYROIDAB in the last 72 hours.  Invalid input(s): FREET3  Other results:   Imaging   No results found.  Medications:    Scheduled Medications: . apixaban  5 mg Oral BID  . Chlorhexidine Gluconate Cloth  6 each Topical Daily  . diltiazem  120 mg Oral QHS  . docusate sodium  100 mg Oral Daily  . furosemide  80 mg Intravenous BID  . metoprolol tartrate  12.5 mg Oral Q8H  . midodrine  15 mg Oral TID WC  . pantoprazole  40 mg Oral BID  . potassium chloride  20 mEq Oral BID  . sodium chloride flush  10-40 mL Intracatheter Q12H  . sodium chloride flush  3 mL Intravenous Q12H    Infusions: . sodium chloride Stopped (02/17/17 1200)    PRN Medications: sodium chloride, acetaminophen (TYLENOL) oral liquid 160 mg/5 mL, albuterol, ipratropium, LORazepam, morphine CONCENTRATE, ondansetron (ZOFRAN) IV, simethicone, sodium chloride, sodium chloride flush, sodium chloride flush  Patient Profile   [name] is a 76 y.o. 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   Admitted with lightheadedness 2/2 blood loss anemia with GI bleed. Cardiology consulted with worsening SOB. HF team consulted with severe HOCM and cardiogenic shock.   Assessment/Plan   1. Cardiogenic shock in setting of HOCM: Complicated anatomy/physiology with probably moderate aortic stenosis and moderate aortic insufficiency, severe LVOT gradient from HOCM, and moderate to severe MR.  SBP in 80s-90s and co-ox had been consistently very low (though not able to draw from line).  He was initially on phenylephrine, now off.  Disopyramide attempted but had torsades thought to be related to disopyramide and this was stopped.     Pacemaker adjusted with echo guidance by  Dr. Caryl Comes, he was left with LV only pacing (lowest LVOT gradient in this mode).   - Seen by Dr. Prescott Gum.  Ideally would have myectomy + aortic valve replacement +  mitral valve repair but not stable for this and seems to have very poor reserve.   - Volume status improving slowly but still 15 pounds up from baseline. CVP 12-13 Will need further diuresis but need to do gently in setting of HOCM. Continue lasix 80 IV bid. Add metolazone 2.5 today and assess response.  - Co-ox remains 44-50%. Will pull cordis. - Renal function stable today at 1.4 - Did not tolerate UNNA boots. Continue TED hose.   - Continue higher dose of midodrine, 15 mg tid.  - Continue metoprolol and diltiazem at current doses. Did not tolerate higher doses - Off disopyramide with torsades.  - Pacer reprogrammed due to episodes of atrial tach 2. GI bleed: Fairly hemoglobin.  Got feraheme 11/8.  Hgb 9.5 3. Chronic Afib: s/p AV nodal ablation with BiV pacing. -No evidence of bleeding.  - Back on Eliquis. Tolerating ok  - Pacer interrogated 11/12 and showed 70% atrial tach and 30% NSR. Device reprogrammed to promote V pacing 60 bpm.  4. CAD:   -No s.s ischemia - Coronary angiography 11/16 with patent ostial and mid LAD stents.    5. VT:  -Torsades on 11/9,  due to disopyramide which has been stopped. No further arrhythmia.  Stable on metoprolol  - Keep K > 4.0 Mg >2.0. Discussed with PharmD 6. Frailty/deconditioning: PT recommending snf.   Pull cordis. Transfer to Tele. Continue diuresis and PT. Hopefully to SNF when volume status improved.    Palliative Care following.     Length of Stay: [place], MD  02/21/2017, 11:17 AM  Advanced Heart Failure Team Pager [phone] (M-F; 7a - 4p)  Please contact [facility] for night-coverage after hours (4p -7a ) and weekends on [email or web address]

## 2017-02-21 NOTE — Plan of Care (Signed)
Patient will stand and pivot or take a few steps from chair to bed, but states the heaviness of his legs are preventing him from walking.  Will continue to encourage him to ambulate.

## 2017-02-21 NOTE — Plan of Care (Signed)
Progressing in clinical picture still DOE, wearing oxygen for comfort, o2 sats > 90 on room air. Wants to go to reahab. Discussed work entailed at [employer]. Central line out. Patient encouraged to turn, developing redness on buttocks. Increase in activity and sitting up in the chair needed. Transfer to stepdown unit when a bed available

## 2017-02-22 LAB — BASIC METABOLIC PANEL
Anion gap: 11 (ref 5–15)
BUN: 26 mg/dL — AB (ref 6–20)
CALCIUM: 8.4 mg/dL — AB (ref 8.9–10.3)
CHLORIDE: 95 mmol/L — AB (ref 101–111)
CO2: 30 mmol/L (ref 22–32)
CREATININE: 1.36 mg/dL — AB (ref 0.61–1.24)
GFR calc non Af Amer: 50 mL/min — ABNORMAL LOW (ref >60)
GFR, EST AFRICAN AMERICAN: 58 mL/min — AB (ref >60)
GLUCOSE: 126 mg/dL — AB (ref 65–99)
Potassium: 3.3 mmol/L — ABNORMAL LOW (ref 3.5–5.1)
Sodium: 136 mmol/L (ref 135–145)

## 2017-02-22 LAB — MAGNESIUM: Magnesium: 2 mg/dL (ref 1.7–2.4)

## 2017-02-22 MED ORDER — METOLAZONE 5 MG PO TABS
2.5000 mg | ORAL_TABLET | Freq: Once | ORAL | Status: AC
  Administered 2017-02-22: 2.5 mg via ORAL
  Filled 2017-02-22: qty 1

## 2017-02-22 MED ORDER — POTASSIUM CHLORIDE CRYS ER 20 MEQ PO TBCR
40.0000 meq | EXTENDED_RELEASE_TABLET | Freq: Three times a day (TID) | ORAL | Status: DC
  Administered 2017-02-22 (×3): 40 meq via ORAL
  Filled 2017-02-22 (×4): qty 2

## 2017-02-22 NOTE — Progress Notes (Signed)
Physical Therapy Treatment Patient Details Name: [name] MRN: [id number] DOB: [birth year] Today's Date: 02/22/2017    History of Present Illness Pt is 76 y.o. male admitted to hospital with GI bleed, anemia, and hypotension. Pt also with acute on chronic heart failure and transferred to ICU. Pt with unresponsive event shocked x1 11/9. PMH includes HTN, afib s/p ablation, pacemaker, CAD with stenting, R hemicolectomy.    PT Comments    Pt on BSC upon entry. Agreed to ambulate before dinner. Min assist for sit<>stand for power up and balance. Able to stand with B UE support while SPTA cleaned bottom. Gait speed still decreased; required cues for posture and pursed lip breathing. Performed seated exercises. Cues for speed and technique. Pt would benefit from skilled PT to improve endurance and functional mobility.  Follow Up Recommendations  SNF     Equipment Recommendations  Other (comment)    Recommendations for Other Services       Precautions / Restrictions Precautions Precautions: Fall Precaution Comments: watch BP Restrictions Weight Bearing Restrictions: No    Mobility  Bed Mobility Overal bed mobility: Needs Assistance             General bed mobility comments: On BSC upon entry.  Transfers Overall transfer level: Needs assistance Equipment used: Rolling walker (2 wheeled) Transfers: Sit to/from Stand Sit to Stand: Min assist         General transfer comment: Min assist for balance and power up.  Ambulation/Gait Ambulation/Gait assistance: Min assist;+2 safety/equipment(chair follow) Ambulation Distance (Feet): 20 Feet Assistive device: Rolling walker (2 wheeled) Gait Pattern/deviations: Step-to pattern;Decreased step length - right;Decreased step length - left;Shuffle;Trunk flexed Gait velocity: decreased   General Gait Details: Min assist for balance. VC for posture and neutral head position. Decreased gait speed. O2 2L/min [place].   Stairs             Wheelchair Mobility    Modified Rankin (Stroke Patients Only)       Balance Overall balance assessment: Needs assistance Sitting-balance support: Feet supported;Bilateral upper extremity supported Sitting balance-Leahy Scale: Fair     Standing balance support: Bilateral upper extremity supported Standing balance-Leahy Scale: Poor Standing balance comment: requires assist to steady                             Cognition Arousal/Alertness: Awake/alert Behavior During Therapy: WFL for tasks assessed/performed Overall Cognitive Status: Within Functional Limits for tasks assessed                                 General Comments: Requiring encouragement to participate      Exercises General Exercises - Lower Extremity Ankle Circles/Pumps: AROM;Both;20 reps;Supine Quad Sets: AROM;Both;10 reps;Supine Long Arc Quad: AROM;Both;10 reps;Seated Hip ABduction/ADduction: AROM;Both;10 reps;Seated Hip Flexion/Marching: AROM;Both;Seated(30 secs)    General Comments        Pertinent Vitals/Pain Pain Assessment: No/denies pain    Home Living                      Prior Function            PT Goals (current goals can now be found in the care plan section) Acute Rehab PT Goals Patient Stated Goal: none discussed PT Goal Formulation: With patient Time For Goal Achievement: 02/25/17 Potential to Achieve Goals: Good Progress towards PT goals: Goals downgraded-see care plan  Frequency    Min 2X/week      PT Plan Current plan remains appropriate    Co-evaluation              AM-PAC PT "6 Clicks" Daily Activity  Outcome Measure  Difficulty turning over in bed (including adjusting bedclothes, sheets and blankets)?: Unable Difficulty moving from lying on back to sitting on the side of the bed? : Unable Difficulty sitting down on and standing up from a chair with arms (e.g., wheelchair, bedside commode, etc,.)?:  Unable Help needed moving to and from a bed to chair (including a wheelchair)?: A Little Help needed walking in hospital room?: A Little Help needed climbing 3-5 steps with a railing? : Total 6 Click Score: 10    End of Session Equipment Utilized During Treatment: Gait belt;Oxygen Activity Tolerance: Patient limited by fatigue Patient left: with call bell/phone within reach;in chair Nurse Communication: Mobility status PT Visit Diagnosis: Muscle weakness (generalized) (M62.81);Difficulty in walking, not elsewhere classified (R26.2)     Time: 2952-8413 PT Time Calculation (min) (ACUTE ONLY): 30 min  Charges:  $Gait Training: 8-22 mins $Therapeutic Activity: 8-22 mins                    G Codes:       Janna Arch, SPTA   Janna Arch 02/22/2017, 5:32 PM

## 2017-02-22 NOTE — Progress Notes (Signed)
Patient ID: [name], male   DOB: 1940/06/26, 76 y.o.   MRN: [id number]     Advanced Heart Failure Rounding Note  Primary Cardiologist:  Dr. Einar Gip  Reason for Consultation: HOCM/CHF  Subjective:    Seen by Dr. Prescott Gum 02/11/17. Too debilitated for surgery at this time.  11/9 had torsades requiring defibrillation and brief CPR.  Disopyramide stopped as likely precipitating torsades.   Device interrogated at bedside with Dr. Caryl Comes on 11/10.  Echo guidance used for device optimization.  At baseline, LV lead was off, and patient was RV pacing only.  Peak LVOT gradient 190 mmHg with RV pacing.  We changed device settings to BiV pacing with fall in LVOT gradient to 160 mmHg.  With LV only pacing, LVOT gradient was 150 mmHg.  We left him with LV only pacing.   Yesterday diuresed with IV lasix + metolazone. Brisk diuresis noted. Weight down 4 pounds.   Denies SOB at rest. Ongoing fatigue and LE edema. Feels weak.   Echo: LV EF 65-70%, moderate to severe LVH with LVOT/AoV gradient (very high gradient across aortic valve, likely contribution from moderate LVH and severe LVOT gradient).  Moderate AS and moderate AI.  Moderate to severe MR.  Moderate TR.  PASP 72 mmHg.   Objective:   Weight Range: 181 lb 1.6 oz (82.1 kg) Body mass index is 24.56 kg/m.   Vital Signs:   Temp:  [97.2 F (36.2 C)-98.7 F (37.1 C)] 97.2 F (36.2 C) (11/19 0800) Pulse Rate:  [56-118] 56 (11/19 0800) Resp:  [17-26] 18 (11/19 0800) BP: (90-104)/(50-71) 93/56 (11/19 0800) SpO2:  [95 %-100 %] 100 % (11/19 0800) Weight:  [181 lb 1.6 oz (82.1 kg)] 181 lb 1.6 oz (82.1 kg) (11/19 0400) Last BM Date: 02/21/17  Weight change: Filed Weights   02/20/17 0500 02/21/17 0607 02/22/17 0400  Weight: 186 lb 8.2 oz (84.6 kg) 185 lb 3 oz (84 kg) 181 lb 1.6 oz (82.1 kg)   Intake/Output:   Intake/Output Summary (Last 24 hours) at 02/22/2017 0859 Last data filed at 02/22/2017 0500 Gross per 24 hour  Intake 1020 ml    Output 3400 ml  Net -2380 ml    Physical Exam  General:  Weak appearing. No resp difficulty. Sitting in the chair.  HEENT: normal Neck: supple. JVP 11-12 . Carotids 2+ bilat; no bruits. No lymphadenopathy or thryomegaly appreciated. Cor: PMI nondisplaced. Regular rate & rhythm. No rubs, gallops . 3/6 SEM across precordium  Lungs: clear Abdomen: soft, nontender, nondistended. No hepatosplenomegaly. No bruits or masses. Good bowel sounds. Extremities: no cyanosis, clubbing, rash, R and LLE 2-3+ edema into thighs under TED.  Neuro: alert & orientedx3, cranial nerves grossly intact. moves all 4 extremities w/o difficulty. Affect pleasant   Telemetry   V pacing 60-70s personally reviewed.    Labs    CBC Recent Labs    02/20/17 0420  WBC 9.2  HGB 9.5*  HCT 30.7*  MCV 96.2  PLT 638   Basic Metabolic Panel Recent Labs    02/21/17 0341 02/22/17 0327  NA 133* 136  K 3.4* 3.3*  CL 97* 95*  CO2 27 30  GLUCOSE 114* 126*  BUN 28* 26*  CREATININE 1.40* 1.36*  CALCIUM 7.8* 8.4*  MG 2.0 2.0   Liver Function Tests No results for input(s): AST, ALT, ALKPHOS, BILITOT, PROT, ALBUMIN in the last 72 hours. No results for input(s): LIPASE, AMYLASE in the last 72 hours. Cardiac Enzymes No results for input(s):  CKTOTAL, CKMB, CKMBINDEX, TROPONINI in the last 72 hours.  BNP: BNP (last 3 results) No results for input(s): BNP in the last 8760 hours.  ProBNP (last 3 results) No results for input(s): PROBNP in the last 8760 hours.  D-Dimer No results for input(s): DDIMER in the last 72 hours. Hemoglobin A1C No results for input(s): HGBA1C in the last 72 hours. Fasting Lipid Panel No results for input(s): CHOL, HDL, LDLCALC, TRIG, CHOLHDL, LDLDIRECT in the last 72 hours. Thyroid Function Tests No results for input(s): TSH, T4TOTAL, T3FREE, THYROIDAB in the last 72 hours.  Invalid input(s): FREET3  Other results:   Imaging   No results found.  Medications:    Scheduled  Medications: . apixaban  5 mg Oral BID  . Chlorhexidine Gluconate Cloth  6 each Topical Daily  . diltiazem  120 mg Oral QHS  . docusate sodium  100 mg Oral Daily  . furosemide  80 mg Intravenous BID  . metolazone  2.5 mg Oral Once  . metoprolol tartrate  12.5 mg Oral Q8H  . midodrine  15 mg Oral TID WC  . pantoprazole  40 mg Oral BID  . potassium chloride  40 mEq Oral TID  . sodium chloride flush  10-40 mL Intracatheter Q12H  . sodium chloride flush  3 mL Intravenous Q12H    Infusions: . sodium chloride Stopped (02/17/17 1200)    PRN Medications: sodium chloride, acetaminophen (TYLENOL) oral liquid 160 mg/5 mL, albuterol, ipratropium, LORazepam, morphine CONCENTRATE, ondansetron (ZOFRAN) IV, simethicone, sodium chloride, sodium chloride flush, sodium chloride flush  Patient Profile   [name] is a 76 y.o. 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   Admitted with lightheadedness 2/2 blood loss anemia with GI bleed. Cardiology consulted with worsening SOB. HF team consulted with severe HOCM and cardiogenic shock.   Assessment/Plan   1. Cardiogenic shock in setting of HOCM: Complicated anatomy/physiology with probably moderate aortic stenosis and moderate aortic insufficiency, severe LVOT gradient from HOCM, and moderate to severe MR.  SBP in 80s-90s and co-ox had been consistently very low (though not able to draw from line).  He was initially on phenylephrine, now off.  Disopyramide attempted but had torsades thought to be related to disopyramide and this was stopped.     Pacemaker adjusted with echo guidance by  Dr. Caryl Comes, he was left with LV only pacing (lowest LVOT gradient in this mode).   - Seen by Dr. Prescott Gum.  Ideally would have myectomy + aortic valve replacement + mitral valve repair but not stable for this and seems to have very poor reserve.   - Will need further diuresis but  need to do gently in setting of HOCM.  - Will need at least one more day of IV lasix + metolazone.   - Creatinine 1.36.  - Did not tolerate UNNA boots. Continue TED hose.   - Continue higher dose of midodrine, 15 mg tid.  - Continue metoprolol and diltiazem at current doses. Did not tolerate higher doses - Off disopyramide with torsades.  - Pacer reprogrammed due to episodes of atrial tach 2. GI bleed: Fairly hemoglobin.  Got feraheme 11/8.  No s/s bleeding.  3. Chronic Afib: s/p AV nodal ablation with BiV pacing. -No evidence of bleeding.  - Continue eliquis twice a day.  - Pacer interrogated 11/12 and showed 70% atrial tach and 30% NSR. Device reprogrammed to promote V pacing 60  bpm.  4. CAD:   -No S/S ischemia  - Coronary angiography 11/16 with patent ostial and mid LAD stents.    5. VT:  -Torsades on 11/9,  due to disopyramide which has been stopped. No further arrhythmia.  Stable on metoprolol  - Keep K > 4.0 Mg >2.0. Discussed with PharmD 6. Frailty/deconditioning: PT recommending snf.     Length of Stay: [place], NP  02/22/2017, 8:59 AM  Advanced Heart Failure Team Pager [phone] (M-F; 7a - 4p)  Please contact [facility] for night-coverage after hours (4p -7a ) and weekends on [email or web address]  Patient seen and examined with Darrick Grinder, NP. We discussed all aspects of the encounter. I agree with the assessment and plan as stated above.   BP soft but stable. Starting to make progress with diuresis. Keep IV lasix going at least 1-2 more days as BP tolerates. Will wrap legs with ACE wraps. (did not tolerate UNNA boots). Hopefully to SNF but end of week.   Glori Bickers, MD  12:01 PM

## 2017-02-22 NOTE — Care Management Important Message (Signed)
Important Message  Patient Details  Name: [name] MRN: [id number] Date of Birth: [birth year]   Medicare Important Message Given:  Yes    Nathen May 02/22/2017, 10:34 AM

## 2017-02-22 NOTE — Progress Notes (Signed)
Daily Progress Note   Patient Name: [name]       Date: 02/22/2017 DOB: 04/13/1940  Age: 76 y.o. MRN#: [id number] Attending Physician: Jolaine Artist, MD Primary Care Physician: [name], MD Admit Date: 02/01/2017  Reason for Consultation/Follow-up: Establishing goals of care  Subjective:  Patient is sitting up in chair, eating lunch. Denies SOB. States he hasn't been up with PT d/t leg swelling.  We discussed GOC and code status. He continues to desire d/c to rehab. He is hopeful for some improvement in his ability to ambulate and care for himself.  He desires full code status- states that he went through it during this admission. He would prefer to be resuscitated and allow his children to make decisions regarding his care.   .Review of Systems  HENT: Positive for congestion.   Respiratory: Positive for cough, sputum production and shortness of breath.   Cardiovascular: Positive for leg swelling.    Length of Stay: 21  Current Medications: Scheduled Meds:  . apixaban  5 mg Oral BID  . Chlorhexidine Gluconate Cloth  6 each Topical Daily  . diltiazem  120 mg Oral QHS  . docusate sodium  100 mg Oral Daily  . furosemide  80 mg Intravenous BID  . metoprolol tartrate  12.5 mg Oral Q8H  . midodrine  15 mg Oral TID WC  . pantoprazole  40 mg Oral BID  . potassium chloride  40 mEq Oral TID  . sodium chloride flush  10-40 mL Intracatheter Q12H  . sodium chloride flush  3 mL Intravenous Q12H    Continuous Infusions: . sodium chloride Stopped (02/17/17 1200)    PRN Meds: sodium chloride, acetaminophen (TYLENOL) oral liquid 160 mg/5 mL, albuterol, ipratropium, LORazepam, morphine CONCENTRATE, ondansetron (ZOFRAN) IV, simethicone, sodium chloride, sodium chloride  flush, sodium chloride flush  Physical Exam  Constitutional: He is oriented to person, place, and time. He appears well-developed and well-nourished. No distress.  Cardiovascular: Normal rate.  Pulmonary/Chest: Effort normal.  Producing yellow sputum  Neurological: He is alert and oriented to person, place, and time.  Skin: Skin is warm and dry. There is pallor.  Psychiatric: He has a normal mood and affect. His behavior is normal. Judgment and thought content normal.  Nursing note and vitals reviewed.  Vital Signs: BP (!) 88/61   Pulse 60   Temp (!) 97.2 F (36.2 C) (Oral)   Resp 18   Ht 6' (1.829 m)   Wt 82.1 kg (181 lb 1.6 oz)   SpO2 100%   BMI 24.56 kg/m  SpO2: SpO2: 100 % O2 Device: O2 Device: Not Delivered O2 Flow Rate: O2 Flow Rate (L/min): 2 L/min  Intake/output summary:   Intake/Output Summary (Last 24 hours) at 02/22/2017 1246 Last data filed at 02/22/2017 1244 Gross per 24 hour  Intake 960 ml  Output 3075 ml  Net -2115 ml   LBM: Last BM Date: 02/21/17 Baseline Weight: Weight: 80.4 kg (177 lb 4.8 oz) Most recent weight: Weight: 82.1 kg (181 lb 1.6 oz)       Palliative Assessment/Data: PPS: 30%      Patient Active Problem List   Diagnosis Date Noted  . Pressure injury of skin 02/17/2017  . Shortness of breath   . Advance care planning   . Palliative care by specialist   . Goals of care, counseling/discussion   . Torsades de pointes ([place])   . Acute on chronic diastolic CHF (congestive heart failure) ([place]) 02/06/2017  . GI bleed 02/01/2017  . Atrial fibrillation ([place]) 10/22/2016  . Typical atrial flutter ([place]) 08/12/2016  . Atrial flutter ([place]) 08/12/2016  . Hypotension 05/06/2016  . Atrial fibrillation with RVR ([place]) 05/06/2016  . AKI (acute kidney injury) ([place]) 05/06/2016  . SVT (supraventricular tachycardia) ([place]) 01/31/2015  . Colonic mass, right colon. 11/10/2011  . Acute bacterial endocarditis 11/06/2011  . HTN (hypertension)  11/06/2011  . BPH (benign prostatic hyperplasia) 11/06/2011  . 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 Back pain 11/06/2011  . Hyperlipidemia 11/06/2011  . Acute renal insufficiency 08/25/2010  . Viridans streptococci infection 08/25/2010  . Streptococcus infection 08/25/2010  . Tooth infection 08/25/2010  . Status post PICC central line placement 08/25/2010  . Arm pain, right 08/25/2010  . ENDOCARDITIS, BACTERIAL, SUBACUTE 05/07/2010  . Iron deficiency anemia 05/06/2010  . Hypertrophic obstructive cardiomyopathy ([place]) 05/06/2010  . SYNCOPE 05/06/2010  . DYSPNEA ON EXERTION 05/06/2010  . ECHOCARDIOGRAM, ABNORMAL 05/06/2010  . HEART MURMUR, HX OF 05/06/2010    Palliative Care Assessment & Plan   Patient Profile: 76 y.o. male  with past medical history of a. Fib ( s/p ablation and pacemaker placement, was on eliquis, now on hold d/t GI bleed s/p colonoscopy), HTN, CHF, aortic stenosis, admitted on 02/01/2017 with complaints of weakness and black tarry stool. Workup revealed GI bleed s/p colonoscopy with biopsy, anemia with hgb down to 8.9. He received transfusions. During admission he developed pulmonary edema and CHF exacerbation d/t fluid overload. 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 He has undergone pacemaker reprogramming and IV pressors (which have been weaned). 11/9 had an episode of Torsades and brief period of unresponsiveness, requiring chest compression and electric shock x 1. Continues to be diuresed by lasix, Cr is starting to trend up. Remains SOB with any effort. Continues to be hypotensive- reports this is normal for him. Ideally, he would need myectomy and valve replacements, however, at this point he is not stable for surgery and poor indication he would be able to regain status to be able to tolerate. Palliative medicine consulted for [place].       Assessment/Recommendations/Plan   Continue full scope care, Full Code status  D/C to rehab  PMT will follow peripherally and see patient  if declines, please reconsult if needed sooner  Recommend referral to outpatient Palliative at SNF  Code Status:  Full code  Prognosis:   < 3 months  Discharge Planning:  To Be Determined  Care plan was discussed with patient.  Thank you for allowing the Palliative Medicine Team to assist in the care of this patient.   Time In: 1225 Time Out: 1250 Total Time 25 mins Prolong Time billed NO      Greater than 50%  of this time was spent counseling and coordinating care related to the above assessment and plan.  Mariana Kaufman, AGNP-C Palliative Medicine   Please contact Palliative Medicine Team phone at [phone] for questions and concerns.

## 2017-02-22 NOTE — Progress Notes (Signed)
CSW met with patient at bedside. CSW and patient discussed palliative meetings and disposition planning. Patient reported he completed health care proxy paperwork and named a family member to make decisions if he is unable to. Patient indicated he prefers to discharge to [facility] and would be agreeable to palliative following him there if recommended. CSW to follow for disposition planning.  Estanislado Emms, [place]

## 2017-02-23 ENCOUNTER — Inpatient Hospital Stay (HOSPITAL_COMMUNITY): Payer: Medicare Other

## 2017-02-23 LAB — EXPECTORATED SPUTUM ASSESSMENT W GRAM STAIN, RFLX TO RESP C

## 2017-02-23 LAB — CBC
HCT: 31.4 % — ABNORMAL LOW (ref 39.0–52.0)
Hemoglobin: 9.3 g/dL — ABNORMAL LOW (ref 13.0–17.0)
MCH: 28.4 pg (ref 26.0–34.0)
MCHC: 29.6 g/dL — AB (ref 30.0–36.0)
MCV: 96 fL (ref 78.0–100.0)
PLATELETS: 146 10*3/uL — AB (ref 150–400)
RBC: 3.27 MIL/uL — ABNORMAL LOW (ref 4.22–5.81)
RDW: 18.1 % — ABNORMAL HIGH (ref 11.5–15.5)
WBC: 8.6 10*3/uL (ref 4.0–10.5)

## 2017-02-23 LAB — URINE CULTURE

## 2017-02-23 LAB — EXPECTORATED SPUTUM ASSESSMENT W REFEX TO RESP CULTURE

## 2017-02-23 LAB — BASIC METABOLIC PANEL
Anion gap: 11 (ref 5–15)
BUN: 25 mg/dL — AB (ref 6–20)
CALCIUM: 8.4 mg/dL — AB (ref 8.9–10.3)
CO2: 32 mmol/L (ref 22–32)
CREATININE: 1.45 mg/dL — AB (ref 0.61–1.24)
Chloride: 92 mmol/L — ABNORMAL LOW (ref 101–111)
GFR calc Af Amer: 53 mL/min — ABNORMAL LOW (ref >60)
GFR calc non Af Amer: 46 mL/min — ABNORMAL LOW (ref >60)
GLUCOSE: 123 mg/dL — AB (ref 65–99)
Potassium: 3.3 mmol/L — ABNORMAL LOW (ref 3.5–5.1)
Sodium: 135 mmol/L (ref 135–145)

## 2017-02-23 LAB — MAGNESIUM: Magnesium: 1.9 mg/dL (ref 1.7–2.4)

## 2017-02-23 MED ORDER — POTASSIUM CHLORIDE CRYS ER 20 MEQ PO TBCR
40.0000 meq | EXTENDED_RELEASE_TABLET | Freq: Two times a day (BID) | ORAL | Status: DC
  Administered 2017-02-23 (×2): 40 meq via ORAL
  Filled 2017-02-23 (×2): qty 2

## 2017-02-23 MED ORDER — LEVOFLOXACIN 500 MG PO TABS
500.0000 mg | ORAL_TABLET | Freq: Every day | ORAL | Status: DC
  Administered 2017-02-23 – 2017-02-26 (×4): 500 mg via ORAL
  Filled 2017-02-23 (×4): qty 1

## 2017-02-23 MED ORDER — MAGNESIUM SULFATE 2 GM/50ML IV SOLN
2.0000 g | Freq: Once | INTRAVENOUS | Status: AC
  Administered 2017-02-23: 2 g via INTRAVENOUS
  Filled 2017-02-23: qty 50

## 2017-02-23 MED ORDER — SODIUM CHLORIDE 0.9 % IV BOLUS (SEPSIS)
250.0000 mL | Freq: Once | INTRAVENOUS | Status: AC
  Administered 2017-02-23: 250 mL via INTRAVENOUS

## 2017-02-23 MED ORDER — TORSEMIDE 20 MG PO TABS
40.0000 mg | ORAL_TABLET | Freq: Every day | ORAL | Status: DC
  Administered 2017-02-23 – 2017-02-24 (×2): 40 mg via ORAL
  Filled 2017-02-23 (×3): qty 2

## 2017-02-23 NOTE — Progress Notes (Addendum)
Cardiology Progress Note  Was paged overnight due to low blood pressure with readings of 79/53, 60/49, and 75/53.  Currently 86/54.  He has been getting diuresed with IV lasix and is receiving diltiazem. Metoprolol has been held.  Also receiving midorine.  He is noted to have HOCM with a significant LVOT gradient.  On exam he was in no distress.  His neck veins appeared flat and lungs were mostly clear to ausculation.  Saturations 100% on nasal cannula.  Given that he did not appear volume overloaded, pt was given 250 cc IV fluid bolus for hypotension.  No adjustments made to diuretic regimen.

## 2017-02-23 NOTE — Progress Notes (Signed)
Patient ID: [name], male   DOB: 1940/11/05, 76 y.o.   MRN: [id number]     Advanced Heart Failure Rounding Note  Primary Cardiologist:  Dr. Einar Gip  Reason for Consultation: HOCM/CHF  Subjective:    Seen by Dr. Prescott Gum 02/11/17. Too debilitated for surgery at this time.  11/9 had torsades requiring defibrillation and brief CPR.  Disopyramide stopped as likely precipitating torsades.   Device interrogated at bedside with Dr. Caryl Comes on 11/10.  Echo guidance used for device optimization.  At baseline, LV lead was off, and patient was RV pacing only.  Peak LVOT gradient 190 mmHg with RV pacing.  We changed device settings to BiV pacing with fall in LVOT gradient to 160 mmHg.  With LV only pacing, LVOT gradient was 150 mmHg.  We left him with LV only pacing.   Yesterday continued to diurese with IV lasix + metolazone. Weight down another 4 pounds. Over night SBP was in the 70s. Received 250 cc NS.   Complaining of productive cough. Brown sputum. Remains SOB with exertion.   Echo: LV EF 65-70%, moderate to severe LVH with LVOT/AoV gradient (very high gradient across aortic valve, likely contribution from moderate LVH and severe LVOT gradient).  Moderate AS and moderate AI.  Moderate to severe MR.  Moderate TR.  PASP 72 mmHg.   Objective:   Weight Range: 177 lb 7.5 oz (80.5 kg) Body mass index is 24.07 kg/m.   Vital Signs:   Temp:  [97.7 F (36.5 C)-98.1 F (36.7 C)] 98.1 F (36.7 C) (11/20 0414) Pulse Rate:  [55-70] 58 (11/20 0521) Resp:  [15-21] 18 (11/20 0521) BP: (60-106)/(49-66) 86/62 (11/20 0521) SpO2:  [97 %-100 %] 100 % (11/20 0521) Weight:  [177 lb 7.5 oz (80.5 kg)] 177 lb 7.5 oz (80.5 kg) (11/20 0521) Last BM Date: 02/22/17  Weight change: Filed Weights   02/21/17 0607 02/22/17 0400 02/23/17 0521  Weight: 185 lb 3 oz (84 kg) 181 lb 1.6 oz (82.1 kg) 177 lb 7.5 oz (80.5 kg)   Intake/Output:   Intake/Output Summary (Last 24 hours) at 02/23/2017 0805 Last data  filed at 02/23/2017 0732 Gross per 24 hour  Intake 500 ml  Output 2225 ml  Net -1725 ml    Physical Exam   eneral:   No resp difficulty in bed HEENT: normal Neck: supple. JVP 9-10. Carotids 2+ bilat; no bruits. No lymphadenopathy or thryomegaly appreciated. Cor: PMI nondisplaced. Irregular rate & rhythm. No rubs, gallops. 3/6 SEM across precordium.  Lungs: LLL crackles RLL decreased on 2 liters oxygen.  Abdomen: soft, nontender, nondistended. No hepatosplenomegaly. No bruits or masses. Good bowel sounds. Extremities: no cyanosis, clubbing, rash, R and LLE ace wraps in place. 1-2+ edema in thighs (improved) Neuro: alert & orientedx3, cranial nerves grossly intact. moves all 4 extremities w/o difficulty. Affect pleasant  Telemetry   A flutter 60s V paced personally reviewed  Labs    CBC Recent Labs    02/23/17 0518  WBC 8.6  HGB 9.3*  HCT 31.4*  MCV 96.0  PLT 732*   Basic Metabolic Panel Recent Labs    02/22/17 0327 02/23/17 0226  NA 136 135  K 3.3* 3.3*  CL 95* 92*  CO2 30 32  GLUCOSE 126* 123*  BUN 26* 25*  CREATININE 1.36* 1.45*  CALCIUM 8.4* 8.4*  MG 2.0 1.9   Liver Function Tests No results for input(s): AST, ALT, ALKPHOS, BILITOT, PROT, ALBUMIN in the last 72 hours. No results for input(s):  LIPASE, AMYLASE in the last 72 hours. Cardiac Enzymes No results for input(s): CKTOTAL, CKMB, CKMBINDEX, TROPONINI in the last 72 hours.  BNP: BNP (last 3 results) No results for input(s): BNP in the last 8760 hours.  ProBNP (last 3 results) No results for input(s): PROBNP in the last 8760 hours.  D-Dimer No results for input(s): DDIMER in the last 72 hours. Hemoglobin A1C No results for input(s): HGBA1C in the last 72 hours. Fasting Lipid Panel No results for input(s): CHOL, HDL, LDLCALC, TRIG, CHOLHDL, LDLDIRECT in the last 72 hours. Thyroid Function Tests No results for input(s): TSH, T4TOTAL, T3FREE, THYROIDAB in the last 72 hours.  Invalid input(s):  FREET3  Other results:   Imaging   No results found.  Medications:    Scheduled Medications: . apixaban  5 mg Oral BID  . Chlorhexidine Gluconate Cloth  6 each Topical Daily  . diltiazem  120 mg Oral QHS  . docusate sodium  100 mg Oral Daily  . furosemide  80 mg Intravenous BID  . metoprolol tartrate  12.5 mg Oral Q8H  . midodrine  15 mg Oral TID WC  . pantoprazole  40 mg Oral BID  . potassium chloride  40 mEq Oral TID  . sodium chloride flush  10-40 mL Intracatheter Q12H  . sodium chloride flush  3 mL Intravenous Q12H    Infusions: . sodium chloride Stopped (02/17/17 1200)    PRN Medications: sodium chloride, acetaminophen (TYLENOL) oral liquid 160 mg/5 mL, albuterol, ipratropium, LORazepam, morphine CONCENTRATE, ondansetron (ZOFRAN) IV, simethicone, sodium chloride, sodium chloride flush, sodium chloride flush  Patient Profile   JERAL ZICK is a 76 y.o. 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   Admitted with lightheadedness 2/2 blood loss anemia with GI bleed. Cardiology consulted with worsening SOB. HF team consulted with severe HOCM and cardiogenic shock.   Assessment/Plan   1. Cardiogenic shock in setting of HOCM: Complicated anatomy/physiology with probably moderate aortic stenosis and moderate aortic insufficiency, severe LVOT gradient from HOCM, and moderate to severe MR.  SBP in 80s-90s and co-ox had been consistently very low (though not able to draw from line).  He was initially on phenylephrine, now off.  Disopyramide attempted but had torsades thought to be related to disopyramide and this was stopped.     Pacemaker adjusted with echo guidance by  Dr. Caryl Comes, he was left with LV only pacing (lowest LVOT gradient in this mode).   - Seen by Dr. Prescott Gum.  Ideally would have myectomy + aortic valve replacement + mitral valve repair but not stable for this and seems to have  very poor reserve.   - Stop IV lasix. Hold IV diuretics today. Start po soon.  - Did not tolerate UNNA boots. Continue TED hose.   - Continue higher dose of midodrine, 15 mg tid.  - Continue metoprolol and diltiazem at current doses. Did not tolerate higher doses - Off disopyramide with torsades.  - Pacer reprogrammed due to episodes of atrial tach 2. GI bleed: Fairly hemoglobin.  Got feraheme 11/8.  No s/s bleeding.  3. Chronic Afib: s/p AV nodal ablation with BiV pacing. Looks like A flutter today.  -No evidence of bleeding.  - Continue eliquis twice a day.  - Pacer interrogated 11/12 and showed 70% atrial tach and 30% NSR. Device reprogrammed to promote V pacing 60 bpm.  4. CAD:   -No S/S ischemia.   -  Coronary angiography 11/16 with patent ostial and mid LAD stents.    5. VT:  -Torsades on 11/9,  due to disopyramide which has been stopped. No further arrhythmia.  Stable on metoprolol  - Keep K > 4.0 Mg >2.0. Discussed with PharmD -Supplement K.  6. Frailty/deconditioning: PT recommending snf.  7. Cough- productive cough. CXR now. Send sputum. WBC ok.   Hold diuretics. CXR now. Send sputum.    Length of Stay: [place], NP  02/23/2017, 8:05 AM Advanced Heart Failure Team Pager [phone] (M-F; 7a - 4p)  Please contact [facility] for night-coverage after hours (4p -7a ) and weekends on [email or web address]  Patient seen and examined with Darrick Grinder, NP. We discussed all aspects of the encounter. I agree with the assessment and plan as stated above.   BP low overnight. Given 250 NS. This am BP remains soft. Has productive cough.  CXR (viewed personally) small bilateral effusions. Will cover with Levaquin. Switch to po diuretics. Weight still up about 5 pounds from baseline.  Hopefully to SNF by Friday.   Glori Bickers, MD  12:47 PM

## 2017-02-23 NOTE — Progress Notes (Addendum)
Occupational Therapy Treatment Patient Details Name: [name] MRN: [id number] DOB: [birth year] Today's Date: 02/23/2017    History of present illness Pt is 76 y.o. male admitted to hospital with GI bleed, anemia, and hypotension. Pt also with acute on chronic heart failure and transferred to ICU. Pt with unresponsive event shocked x1 11/9. PMH includes HTN, afib s/p ablation, pacemaker, CAD with stenting, R hemicolectomy.   OT comments  Pt demonstrating progress toward OT goals. He continues to require therapeutic rest breaks during seated and standing ADL tasks. He was able to complete seated grooming tasks with set-up and increased time sitting in chair without back support this session. He was able to complete stand-pivot toilet transfer to [name] with min guard assist for safety this session. Pt with stable vital signs as reported below throughout ADL; however, pt continues to demonstrate increased work of breathing with minimal activity. Pt with improved motivation to participate with therapy and return to independence as compared with previous sessions. Will continue to follow while admitted. D/C recommendation remains appropriate.   Follow Up Recommendations  SNF;Supervision/Assistance - 24 hour    Equipment Recommendations  None recommended by OT    Recommendations for Other Services      Precautions / Restrictions Precautions Precautions: Fall Precaution Comments: watch BP Restrictions Weight Bearing Restrictions: No       Mobility Bed Mobility               General bed mobility comments: OOB in chair on my arrival  Transfers Overall transfer level: Needs assistance Equipment used: Rolling walker (2 wheeled) Transfers: Sit to/from Stand Sit to Stand: Min guard;From elevated surface         General transfer comment: Min guard assist for safety and balance from [facility].     Balance Overall balance assessment: Needs assistance Sitting-balance support: Feet  supported;Bilateral upper extremity supported Sitting balance-Leahy Scale: Fair Sitting balance - Comments: Able to sit without back support for ADL tasks.    Standing balance support: Bilateral upper extremity supported;Single extremity supported Standing balance-Leahy Scale: Poor Standing balance comment: Relies on at least single UE support.                            ADL either performed or assessed with clinical judgement   ADL Overall ADL's : Needs assistance/impaired Eating/Feeding: Set up;Sitting   Grooming: Set up;Sitting Grooming Details (indicate cue type and reason): Multiple therapeutic rest breaks                 Toilet Transfer: Min Statistician Details (indicate cue type and reason): Min guard assist for safety. Pt able to power up without assistance this session.            General ADL Comments: Pt continues to demonstrate significantly diminished activity tolerance for ADL. He was able to complete multiple ADL tasks seated without back support in chair; however, pt requiring multiple therapeutic rest breaks throughout and demonstrates increased work of breathing despite SpO2 >90% on 3L O2. Pt was able to tolerate face washing task without supplemental O2 with stable SpO2 94-98% on RA for brief time. Replaced 3L O2 for continued tasks due to pt fatigue and increased work of breathing.      Vision   Additional Comments: Wearing glasses throughout session.    Perception     Praxis      Cognition Arousal/Alertness: Awake/alert Behavior During Therapy: WFL for tasks assessed/performed  Overall Cognitive Status: Within Functional Limits for tasks assessed                                          Exercises     Shoulder Instructions       General Comments HR up to 84 bpm while completing seated ADL tasks.     Pertinent Vitals/ Pain       Pain Assessment: No/denies pain  Home Living                                           Prior Functioning/Environment              Frequency  Min 2X/week        Progress Toward Goals  OT Goals(current goals can now be found in the care plan section)  Progress towards OT goals: Progressing toward goals  Acute Rehab OT Goals Patient Stated Goal: get back to being independent OT Goal Formulation: With patient Time For Goal Achievement: 03/01/17 Potential to Achieve Goals: Good  Plan Discharge plan remains appropriate    Co-evaluation                 AM-PAC PT "6 Clicks" Daily Activity     Outcome Measure   Help from another person eating meals?: A Little Help from another person taking care of personal grooming?: A Little Help from another person toileting, which includes using toliet, bedpan, or urinal?: A Lot Help from another person bathing (including washing, rinsing, drying)?: A Lot Help from another person to put on and taking off regular upper body clothing?: A Little Help from another person to put on and taking off regular lower body clothing?: A Lot 6 Click Score: 15    End of Session Equipment Utilized During Treatment: Gait belt(3L O2)  OT Visit Diagnosis: Unsteadiness on feet (R26.81)   Activity Tolerance Patient limited by fatigue   Patient Left in chair;with call bell/phone within reach   Nurse Communication Mobility status        Time: 9826-4158 OT Time Calculation (min): 44 min  Charges: OT General Charges $OT Visit: 1 Visit OT Treatments $Self Care/Home Management : 23-37 mins $Therapeutic Activity: 8-22 mins  William Herrlich, MS OTR/L  Pager: [name] 02/23/2017, 3:55 PM

## 2017-02-24 DIAGNOSIS — N390 Urinary tract infection, site not specified: Secondary | ICD-10-CM

## 2017-02-24 LAB — BASIC METABOLIC PANEL
ANION GAP: 11 (ref 5–15)
BUN: 24 mg/dL — AB (ref 6–20)
CALCIUM: 8.3 mg/dL — AB (ref 8.9–10.3)
CO2: 33 mmol/L — AB (ref 22–32)
CREATININE: 1.39 mg/dL — AB (ref 0.61–1.24)
Chloride: 90 mmol/L — ABNORMAL LOW (ref 101–111)
GFR calc Af Amer: 56 mL/min — ABNORMAL LOW (ref >60)
GFR calc non Af Amer: 48 mL/min — ABNORMAL LOW (ref >60)
GLUCOSE: 119 mg/dL — AB (ref 65–99)
Potassium: 3.1 mmol/L — ABNORMAL LOW (ref 3.5–5.1)
Sodium: 134 mmol/L — ABNORMAL LOW (ref 135–145)

## 2017-02-24 LAB — MAGNESIUM: MAGNESIUM: 2.1 mg/dL (ref 1.7–2.4)

## 2017-02-24 LAB — CULTURE, RESPIRATORY

## 2017-02-24 LAB — CULTURE, RESPIRATORY W GRAM STAIN

## 2017-02-24 MED ORDER — ACETAZOLAMIDE 250 MG PO TABS
250.0000 mg | ORAL_TABLET | Freq: Two times a day (BID) | ORAL | Status: DC
  Administered 2017-02-24 (×2): 250 mg via ORAL
  Filled 2017-02-24 (×3): qty 1

## 2017-02-24 MED ORDER — TAMSULOSIN HCL 0.4 MG PO CAPS
0.4000 mg | ORAL_CAPSULE | Freq: Every day | ORAL | Status: DC
  Administered 2017-02-24 – 2017-02-25 (×2): 0.4 mg via ORAL
  Filled 2017-02-24 (×2): qty 1

## 2017-02-24 MED ORDER — POTASSIUM CHLORIDE CRYS ER 20 MEQ PO TBCR
40.0000 meq | EXTENDED_RELEASE_TABLET | Freq: Three times a day (TID) | ORAL | Status: DC
  Administered 2017-02-24 (×3): 40 meq via ORAL
  Filled 2017-02-24 (×3): qty 2

## 2017-02-24 NOTE — Progress Notes (Signed)
CSW confirmed with [facility] that they will have a bed available for patient if he discharges later this week. CSW to follow and support with discharge planning.  [name], [place]

## 2017-02-24 NOTE — Progress Notes (Signed)
Physical Therapy Treatment Patient Details Name: [name] MRN: [id number] DOB: [birth year] Today's Date: 02/24/2017    History of Present Illness Pt is 76 y.o. male admitted to hospital with GI bleed, anemia, and hypotension. Pt also with acute on chronic heart failure and transferred to ICU. Pt with unresponsive event shocked x1 11/9. PMH includes HTN, afib s/p ablation, pacemaker, CAD with stenting, R hemicolectomy.    PT Comments    Pt in recliner upon arrival. Seated BP 78/50. Notified RN. Performed seated exercises with cues for speed and technique. Retook BP and was 93/55. RN cleared for ambulation. Cues for hand placement during transfers. Assist for power up from first sit to stand; min guard for safety for following transfers. Pt walked to doorway of room where had incidence of LOB while turning right. Pt had seated rest break after LOB. Pt BP 117/66 seated; O2 sat 100% on 3 L/min [place]; HR 100s. With encouragement pt continued to ambulate for 30 ft before stated felt tired and almost dizzy. Wheeled pt back to room. Seated BP 103/63 after session. Pt would benefit from continued PT to increase endurance and functional mobility.  Follow Up Recommendations  SNF     Equipment Recommendations  Other (comment)    Recommendations for Other Services       Precautions / Restrictions Precautions Precautions: Fall Precaution Comments: watch BP Restrictions Weight Bearing Restrictions: No    Mobility  Bed Mobility Overal bed mobility: Needs Assistance             General bed mobility comments: OOB in chair on my arrival  Transfers Overall transfer level: Needs assistance Equipment used: Rolling walker (2 wheeled) Transfers: Sit to/from Stand Sit to Stand: Min assist;Min guard         General transfer comment: Min assist for safety and power up on first sit to stand. Min guard for following transfers.  Ambulation/Gait Ambulation/Gait assistance: Min assist;+2  safety/equipment;Mod assist Ambulation Distance (Feet): 20 Feet(67ft first trial; 72ft second trial; seated rest break required) Assistive device: Rolling walker (2 wheeled) Gait Pattern/deviations: Step-to pattern;Decreased step length - right;Decreased step length - left;Shuffle;Trunk flexed Gait velocity: decreased   General Gait Details: VC for posture and neutral head position. Decreased gait speed. O2 3L/min [place]; sats >90% with ambulation. One LOB where pt required mod A to regain balance; pt had seated rest break after LOB.   Stairs            Wheelchair Mobility    Modified Rankin (Stroke Patients Only)       Balance Overall balance assessment: Needs assistance Sitting-balance support: Feet supported;Bilateral upper extremity supported Sitting balance-Leahy Scale: Fair     Standing balance support: Bilateral upper extremity supported Standing balance-Leahy Scale: Poor Standing balance comment: Relies on UE support for balance. One LOB when turning R out of room.                            Cognition Arousal/Alertness: Awake/alert Behavior During Therapy: WFL for tasks assessed/performed Overall Cognitive Status: Within Functional Limits for tasks assessed                                 General Comments: Requiring encouragement to participate      Exercises General Exercises - Lower Extremity Ankle Circles/Pumps: AROM;Both;20 reps;Supine Short Arc Quad: AROM;Both;10 reps;Supine Long Arc Quad: AROM;Both;10 reps;Seated Straight Leg  Raises: AAROM;Both;10 reps;Supine    General Comments        Pertinent Vitals/Pain Pain Assessment: No/denies pain    Home Living                      Prior Function            PT Goals (current goals can now be found in the care plan section) Acute Rehab PT Goals Patient Stated Goal: non discussed PT Goal Formulation: With patient Time For Goal Achievement: 02/25/17 Potential to  Achieve Goals: Good Progress towards PT goals: Goals downgraded-see care plan    Frequency    Min 2X/week      PT Plan Current plan remains appropriate    Co-evaluation              AM-PAC PT "6 Clicks" Daily Activity  Outcome Measure  Difficulty turning over in bed (including adjusting bedclothes, sheets and blankets)?: Unable Difficulty moving from lying on back to sitting on the side of the bed? : Unable Difficulty sitting down on and standing up from a chair with arms (e.g., wheelchair, bedside commode, etc,.)?: Unable Help needed moving to and from a bed to chair (including a wheelchair)?: A Little Help needed walking in hospital room?: A Little Help needed climbing 3-5 steps with a railing? : Total 6 Click Score: 10    End of Session Equipment Utilized During Treatment: Gait belt;Oxygen Activity Tolerance: Patient limited by fatigue Patient left: with call bell/phone within reach;in chair Nurse Communication: Mobility status PT Visit Diagnosis: Muscle weakness (generalized) (M62.81);Difficulty in walking, not elsewhere classified (R26.2)     Time: 9373-4287 PT Time Calculation (min) (ACUTE ONLY): 39 min  Charges:  $Gait Training: 8-22 mins $Therapeutic Exercise: 8-22 mins $Therapeutic Activity: 8-22 mins                    G Codes:       Janna Arch, SPTA   Janna Arch 02/24/2017, 1:36 PM

## 2017-02-24 NOTE — Progress Notes (Signed)
Called Darrick Grinder, NP for parameters for betablocker.  OK to give if SBP >85.

## 2017-02-24 NOTE — Progress Notes (Signed)
Patient ID: [name], male   DOB: 1941-01-28, 76 y.o.   MRN: [id number]     Advanced Heart Failure Rounding Note  Primary Cardiologist:  Dr. Einar Gip  Reason for Consultation: HOCM/CHF  Subjective:    Seen by Dr. Prescott Gum 02/11/17. Too debilitated for surgery at this time.  11/9 had torsades requiring defibrillation and brief CPR.  Disopyramide stopped as likely precipitating torsades.   Device interrogated at bedside with Dr. Caryl Comes on 11/10.  Echo guidance used for device optimization.  At baseline, LV lead was off, and patient was RV pacing only.  Peak LVOT gradient 190 mmHg with RV pacing.  We changed device settings to BiV pacing with fall in LVOT gradient to 160 mmHg.  With LV only pacing, LVOT gradient was 150 mmHg.  We left him with LV only pacing.   Urine CX -->Klebsiella pneumoniae.   Sputum CX-->pending.   Yesterday diuretics held due to soft bp. SBP remains 70-80s.   Less coughing over night. Denies SOB.  Still feels swollen. No f/c. No dysuria. C/o urinary hesitancy.   Echo: LV EF 65-70%, moderate to severe LVH with LVOT/AoV gradient (very high gradient across aortic valve, likely contribution from moderate LVH and severe LVOT gradient).  Moderate AS and moderate AI.  Moderate to severe MR.  Moderate TR.  PASP 72 mmHg.   Objective:   Weight Range: 177 lb 3.2 oz (80.4 kg) Body mass index is 24.03 kg/m.   Vital Signs:   Temp:  [97.8 F (36.6 C)-98.7 F (37.1 C)] 97.9 F (36.6 C) (11/21 0742) Pulse Rate:  [58-62] 60 (11/21 0742) Resp:  [13-21] 18 (11/21 0742) BP: (79-110)/(52-70) 85/55 (11/21 0742) SpO2:  [98 %-100 %] 100 % (11/21 0742) Weight:  [177 lb 3.2 oz (80.4 kg)] 177 lb 3.2 oz (80.4 kg) (11/21 0653) Last BM Date: 02/23/17  Weight change: Filed Weights   02/22/17 0400 02/23/17 0521 02/24/17 0653  Weight: 181 lb 1.6 oz (82.1 kg) 177 lb 7.5 oz (80.5 kg) 177 lb 3.2 oz (80.4 kg)   Intake/Output:   Intake/Output Summary (Last 24 hours) at 02/24/2017  0808 Last data filed at 02/24/2017 0654 Gross per 24 hour  Intake 763 ml  Output 2201 ml  Net -1438 ml    Physical Exam  General:  Well appearing. No resp difficulty. Sitting on the side of the bed.  HEENT: normal Neck: supple. JVP 10 Carotids 2+ bilat; no bruits. No lymphadenopathy or thryomegaly appreciated. Cor: PMI nondisplaced. Irregular rate & rhythm. No rubs, gallop. 3/6 SEM across precordium.  Lungs: Decreased throughout on 2 liters  Abdomen: soft, nontender, nondistended. No hepatosplenomegaly. No bruits or masses. Good bowel sounds. Extremities: no cyanosis, clubbing, rash, R and LLE ace wraps in place. Upper thighs 2+ edema Neuro: alert & orientedx3, cranial nerves grossly intact. moves all 4 extremities w/o difficulty. Affect pleasant    Telemetry   A fib 60s personally reviewed.   Labs    CBC Recent Labs    02/23/17 0518  WBC 8.6  HGB 9.3*  HCT 31.4*  MCV 96.0  PLT 277*   Basic Metabolic Panel Recent Labs    02/23/17 0226 02/24/17 0443  NA 135 134*  K 3.3* 3.1*  CL 92* 90*  CO2 32 33*  GLUCOSE 123* 119*  BUN 25* 24*  CREATININE 1.45* 1.39*  CALCIUM 8.4* 8.3*  MG 1.9 2.1   Liver Function Tests No results for input(s): AST, ALT, ALKPHOS, BILITOT, PROT, ALBUMIN in the last 72  hours. No results for input(s): LIPASE, AMYLASE in the last 72 hours. Cardiac Enzymes No results for input(s): CKTOTAL, CKMB, CKMBINDEX, TROPONINI in the last 72 hours.  BNP: BNP (last 3 results) No results for input(s): BNP in the last 8760 hours.  ProBNP (last 3 results) No results for input(s): PROBNP in the last 8760 hours.  D-Dimer No results for input(s): DDIMER in the last 72 hours. Hemoglobin A1C No results for input(s): HGBA1C in the last 72 hours. Fasting Lipid Panel No results for input(s): CHOL, HDL, LDLCALC, TRIG, CHOLHDL, LDLDIRECT in the last 72 hours. Thyroid Function Tests No results for input(s): TSH, T4TOTAL, T3FREE, THYROIDAB in the last 72  hours.  Invalid input(s): FREET3  Other results:   Imaging   Dg Chest 2 View  Result Date: 02/23/2017 CLINICAL DATA:  Short of breath today EXAM: CHEST  2 VIEW COMPARISON:  Portable chest x-ray of 02/11/2017 FINDINGS: Opacities remain at both lung bases left-greater-than-right most consistent with pleural effusions and atelectasis. Pneumonia particularly at the left lung base cannot be excluded. The small opacity noted previously in the right upper lung has cleared in the interval. Permanent pacemaker remains. No acute bony abnormality is seen. IMPRESSION: Little change in bilateral pleural effusions and basilar atelectasis left-greater-than-right. Cannot exclude pneumonia particularly at the left lung base. Electronically Signed   By: Ivar Drape M.D.   On: 02/23/2017 10:17    Medications:    Scheduled Medications: . apixaban  5 mg Oral BID  . Chlorhexidine Gluconate Cloth  6 each Topical Daily  . diltiazem  120 mg Oral QHS  . docusate sodium  100 mg Oral Daily  . levofloxacin  500 mg Oral Daily  . metoprolol tartrate  12.5 mg Oral Q8H  . midodrine  15 mg Oral TID WC  . pantoprazole  40 mg Oral BID  . potassium chloride  40 mEq Oral BID  . sodium chloride flush  10-40 mL Intracatheter Q12H  . sodium chloride flush  3 mL Intravenous Q12H  . torsemide  40 mg Oral Daily    Infusions: . sodium chloride Stopped (02/17/17 1200)    PRN Medications: sodium chloride, acetaminophen (TYLENOL) oral liquid 160 mg/5 mL, albuterol, ipratropium, LORazepam, morphine CONCENTRATE, ondansetron (ZOFRAN) IV, simethicone, sodium chloride, sodium chloride flush, sodium chloride flush  Patient Profile   [name] is a 76 y.o. 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   Admitted with lightheadedness 2/2 blood loss anemia with GI bleed. Cardiology consulted with worsening SOB. HF team consulted with severe  HOCM and cardiogenic shock.   Assessment/Plan   1. Cardiogenic shock in setting of HOCM: Complicated anatomy/physiology with probably moderate aortic stenosis and moderate aortic insufficiency, severe LVOT gradient from HOCM, and moderate to severe MR.  SBP in 80s-90s and co-ox had been consistently very low (though not able to draw from line).  He was initially on phenylephrine, now off.  Disopyramide attempted but had torsades thought to be related to disopyramide and this was stopped.     Pacemaker adjusted with echo guidance by  Dr. Caryl Comes, he was left with LV only pacing (lowest LVOT gradient in this mode).   - Seen by Dr. Prescott Gum.  Ideally would have myectomy + aortic valve replacement + mitral valve repair but not stable for this and seems to have very poor reserve.   -Off IV lasix. CO2 up to 33. Give 250 mg diamox  twice daily today.    - Did not tolerate UNNA boots. Continue ace wraps - Continue higher dose of midodrine, 15 mg tid.  - Continue metoprolol and diltiazem at current doses. Did not tolerate higher doses - Off disopyramide with torsades.  - Pacer reprogrammed due to episodes of atrial tach 2. GI bleed: Fairly hemoglobin.  Got feraheme 11/8.  No s/s bleeding.  3. Chronic Afib: s/p AV nodal ablation with BiV pacing. Looks like A flutter today.  -No bleeding problems.  - Continue eliquis twice a day.  - Pacer interrogated 11/12 and showed 70% atrial tach and 30% NSR. Device reprogrammed to promote V pacing 60 bpm.  4. CAD:   -No S/S ischemia. .   - Coronary angiography 11/16 with patent ostial and mid LAD stents.    5. VT:  -Torsades on 11/9,  due to disopyramide which has been stopped. No further arrhythmia.  Stable on metoprolol  - Keep K > 4.0 Mg >2.0. Discussed with PharmD -K 3.1  Increase potassium but to 40 meq three times a day.  6. Frailty/deconditioning: PT recommending snf.  7. Cough- CXR concerning for pneumonia. Urine -->Klebsiella pneumoniae. Sputum cx pending.  Started on levaquin 02/23/17  8. UTI  - treat with levaquin 9. Urinary hesitancy - start flomax. Watch BP closely in setting of HOCM   Disposition: SNF. Possibly on Friday.    Length of Stay: [place], NP  02/24/2017, 8:08 AM Advanced Heart Failure Team Pager [phone] (M-F; 7a - 4p)  Please contact [facility] for night-coverage after hours (4p -7a ) and weekends on [email or web address]  Patient seen and examined with Darrick Grinder, NP. We discussed all aspects of the encounter. I agree with the assessment and plan as stated above.   He remains quite weak. Ucx now with Klebsiella. Treating with levaquin.   Volume status still slightly elevated. Diurese slowly with po diuretics as BP tolerates in setting of HOCM.   Start flomax carefully for urinary hesitancy.   Hopefully to SNF on Friday (prefers [facility])  Glori Bickers, MD  4:54 PM

## 2017-02-25 LAB — BASIC METABOLIC PANEL
Anion gap: 9 (ref 5–15)
BUN: 28 mg/dL — AB (ref 6–20)
CALCIUM: 8.2 mg/dL — AB (ref 8.9–10.3)
CO2: 32 mmol/L (ref 22–32)
CREATININE: 1.66 mg/dL — AB (ref 0.61–1.24)
Chloride: 94 mmol/L — ABNORMAL LOW (ref 101–111)
GFR calc non Af Amer: 39 mL/min — ABNORMAL LOW (ref >60)
GFR, EST AFRICAN AMERICAN: 45 mL/min — AB (ref >60)
GLUCOSE: 122 mg/dL — AB (ref 65–99)
Potassium: 3.5 mmol/L (ref 3.5–5.1)
Sodium: 135 mmol/L (ref 135–145)

## 2017-02-25 LAB — MAGNESIUM: MAGNESIUM: 2.1 mg/dL (ref 1.7–2.4)

## 2017-02-25 MED ORDER — TORSEMIDE 20 MG PO TABS
20.0000 mg | ORAL_TABLET | Freq: Every day | ORAL | Status: DC
  Administered 2017-02-25 – 2017-02-26 (×2): 20 mg via ORAL
  Filled 2017-02-25: qty 1

## 2017-02-25 MED ORDER — METOPROLOL TARTRATE 12.5 MG HALF TABLET
12.5000 mg | ORAL_TABLET | Freq: Two times a day (BID) | ORAL | Status: DC
  Administered 2017-02-25 – 2017-02-26 (×2): 12.5 mg via ORAL
  Filled 2017-02-25 (×2): qty 1

## 2017-02-25 MED ORDER — POTASSIUM CHLORIDE CRYS ER 20 MEQ PO TBCR
40.0000 meq | EXTENDED_RELEASE_TABLET | Freq: Two times a day (BID) | ORAL | Status: DC
  Administered 2017-02-25 – 2017-02-26 (×3): 40 meq via ORAL
  Filled 2017-02-25 (×2): qty 2

## 2017-02-25 NOTE — Progress Notes (Addendum)
Patient ID: [name], male   DOB: [birth year], 76 y.o.   MRN: [id number]     Advanced Heart Failure Rounding Note  Primary Cardiologist:  Dr. Einar Gip  Reason for Consultation: HOCM/CHF  Subjective:    Seen by Dr. Prescott Gum 02/11/17. Too debilitated for surgery at this time.  11/9 had torsades requiring defibrillation and brief CPR.  Disopyramide stopped as likely precipitating torsades.   Device interrogated at bedside with Dr. Caryl Comes on 11/10.  Echo guidance used for device optimization.  At baseline, LV lead was off, and patient was RV pacing only.  Peak LVOT gradient 190 mmHg with RV pacing.  We changed device settings to BiV pacing with fall in LVOT gradient to 160 mmHg.  With LV only pacing, LVOT gradient was 150 mmHg.  We left him with LV only pacing.   Urine CX -->Klebsiella pneumoniae.   Sputum CX--> ABUNDANT MORAXELLA CATARRHALIS(BRANHAMELLA) - no sensitivities provided    Remains weak. Cough better. Remains on po torsemide.  Weights inaccurate. Creatinine up 1.3-> 1.6  Started on Flomax yesterday for urinary hesitancy. SBP 80s overnight. Now 105-110. Leg edema much improved with keeping feet up overnight.   Echo: LV EF 65-70%, moderate to severe LVH with LVOT/AoV gradient (very high gradient across aortic valve, likely contribution from moderate LVH and severe LVOT gradient).  Moderate AS and moderate AI.  Moderate to severe MR.  Moderate TR.  PASP 72 mmHg.   Objective:   Weight Range: 88.2 kg (194 lb 7.1 oz) Body mass index is 26.37 kg/m.   Vital Signs:   Temp:  [98.1 F (36.7 C)-98.4 F (36.9 C)] 98.1 F (36.7 C) (11/22 0408) Pulse Rate:  [57-60] 59 (11/22 0408) Resp:  [15-18] 18 (11/22 0408) BP: (79-117)/(51-66) 81/55 (11/22 0408) SpO2:  [100 %] 100 % (11/22 0408) Weight:  [88.2 kg (194 lb 7.1 oz)] 88.2 kg (194 lb 7.1 oz) (11/22 0736) Last BM Date: 02/24/17  Weight change: Filed Weights   02/23/17 0521 02/24/17 0653 02/25/17 0736  Weight: 80.5 kg (177  lb 7.5 oz) 80.4 kg (177 lb 3.2 oz) 88.2 kg (194 lb 7.1 oz)   Intake/Output:   Intake/Output Summary (Last 24 hours) at 02/25/2017 0924 Last data filed at 02/24/2017 2312 Gross per 24 hour  Intake 480 ml  Output 200 ml  Net 280 ml    Physical Exam   General:  Sitting in chair. Weak appearing No resp difficulty HEENT: normal Neck: supple. JVP 8-9 . Carotids 2+ bilat; + bilateral radiated bruits. No lymphadenopathy or thryomegaly appreciated. Cor: PMI nondisplaced. Regular rate & rhythm.2/6 SEM across precordium  Lungs: clear Abdomen: soft, nontender, nondistended. No hepatosplenomegaly. No bruits or masses. Good bowel sounds. Extremities: no cyanosis, clubbing, rash, tr-1+ edema Neuro: alert & orientedx3, cranial nerves grossly intact. moves all 4 extremities w/o difficulty. Affect pleasant   Telemetry   V pacing 60s personally reviewed.   Labs    CBC Recent Labs    02/23/17 0518  WBC 8.6  HGB 9.3*  HCT 31.4*  MCV 96.0  PLT 211*   Basic Metabolic Panel Recent Labs    02/24/17 0443 02/25/17 0252  NA 134* 135  K 3.1* 3.5  CL 90* 94*  CO2 33* 32  GLUCOSE 119* 122*  BUN 24* 28*  CREATININE 1.39* 1.66*  CALCIUM 8.3* 8.2*  MG 2.1 2.1   Liver Function Tests No results for input(s): AST, ALT, ALKPHOS, BILITOT, PROT, ALBUMIN in the last 72 hours. No results for input(s):  LIPASE, AMYLASE in the last 72 hours. Cardiac Enzymes No results for input(s): CKTOTAL, CKMB, CKMBINDEX, TROPONINI in the last 72 hours.  BNP: BNP (last 3 results) No results for input(s): BNP in the last 8760 hours.  ProBNP (last 3 results) No results for input(s): PROBNP in the last 8760 hours.  D-Dimer No results for input(s): DDIMER in the last 72 hours. Hemoglobin A1C No results for input(s): HGBA1C in the last 72 hours. Fasting Lipid Panel No results for input(s): CHOL, HDL, LDLCALC, TRIG, CHOLHDL, LDLDIRECT in the last 72 hours. Thyroid Function Tests No results for input(s): TSH,  T4TOTAL, T3FREE, THYROIDAB in the last 72 hours.  Invalid input(s): FREET3  Other results:   Imaging   No results found.  Medications:    Scheduled Medications: . acetaZOLAMIDE  250 mg Oral BID  . apixaban  5 mg Oral BID  . diltiazem  120 mg Oral QHS  . docusate sodium  100 mg Oral Daily  . levofloxacin  500 mg Oral Daily  . metoprolol tartrate  12.5 mg Oral Q8H  . midodrine  15 mg Oral TID WC  . pantoprazole  40 mg Oral BID  . potassium chloride  40 mEq Oral TID  . sodium chloride flush  10-40 mL Intracatheter Q12H  . sodium chloride flush  3 mL Intravenous Q12H  . tamsulosin  0.4 mg Oral QPC supper  . torsemide  40 mg Oral Daily    Infusions: . sodium chloride Stopped (02/17/17 1200)    PRN Medications: sodium chloride, acetaminophen (TYLENOL) oral liquid 160 mg/5 mL, albuterol, ipratropium, LORazepam, morphine CONCENTRATE, ondansetron (ZOFRAN) IV, simethicone, sodium chloride, sodium chloride flush, sodium chloride flush  Patient Profile   [name] is a 76 y.o. 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   Admitted with lightheadedness 2/2 blood loss anemia with GI bleed. Cardiology consulted with worsening SOB. HF team consulted with severe HOCM and cardiogenic shock.   Assessment/Plan   1. Cardiogenic shock in setting of HOCM: Complicated anatomy/physiology with probably moderate aortic stenosis and moderate aortic insufficiency, severe LVOT gradient from HOCM, and moderate to severe MR.  SBP in 80s-90s and co-ox had been consistently very low (though not able to draw from line).  He was initially on phenylephrine, now off.  Disopyramide attempted but had torsades thought to be related to disopyramide and this was stopped.     Pacemaker adjusted with echo guidance by  Dr. Caryl Comes, he was left with LV only pacing (lowest LVOT gradient in this mode).   - Seen by Dr. Prescott Gum.   Ideally would have myectomy + aortic valve replacement + mitral valve repair but not stable for this and seems to have very poor reserve.   - Off IV lasix. On diamox and po torsemide. Creatinine up slightly and BP soft - Volume status probably as good as we can get it. Will cut torsemide to 20. Stop diamox. Place thigh TED - Continue higher dose of midodrine, 15 mg tid.  - Continue metoprolol and diltiazem at current doses. Did not tolerate higher doses - Off disopyramide with torsades.  - Pacer reprogrammed due to episodes of atrial tach 2. GI bleed:  - Got feraheme 11/8.  No s/s bleeding.  - Hgb stable at 9.3 - Continue PPI  3. Chronic Afib: s/p AV nodal ablation with BiV pacing. Looks like A flutter today.  -No bleeding problems.  - Continue  eliquis twice a day.  - Pacer interrogated 11/12 and showed 70% atrial tach and 30% NSR. Device reprogrammed to promote V pacing 60 bpm.  4. CAD:   -No s/s ischemia - Coronary angiography 11/16 with patent ostial and mid LAD stents.    5. VT:  -Torsades on 11/9,  due to disopyramide which has been stopped. No further arrhythmia.  Stable on metoprolol  - Keep K > 4.0 Mg >2.0. Discussed with PharmD -K 3.5. Mg 2.1. Will supp  6. Frailty/deconditioning: PT recommending snf.  7. Cough- CXR concerning for pneumonia. Sputum cx - Moraxella  - Started on levaquin 02/23/17 with lung infection should likely increase to 750. Will d/w PharmD.  8. UTI, klebsiella - treat with levaquin 9. Urinary hesitancy - Flomax started 11/21. BP soft but ok. May need to hold in setting of HOCM   Disposition: SNF. Possibly on Friday. I d/w with SW personally.    Length of Stay: [place], MD  02/25/2017, 9:24 AM Advanced Heart Failure Team Pager [phone] (M-F; 7a - 4p)  Please contact [facility] for night-coverage after hours (4p -7a ) and weekends on [email or web address]

## 2017-02-26 DIAGNOSIS — R2681 Unsteadiness on feet: Secondary | ICD-10-CM | POA: Diagnosis not present

## 2017-02-26 DIAGNOSIS — N390 Urinary tract infection, site not specified: Secondary | ICD-10-CM | POA: Diagnosis not present

## 2017-02-26 DIAGNOSIS — R42 Dizziness and giddiness: Secondary | ICD-10-CM | POA: Diagnosis not present

## 2017-02-26 DIAGNOSIS — R0602 Shortness of breath: Secondary | ICD-10-CM | POA: Diagnosis not present

## 2017-02-26 DIAGNOSIS — Z79899 Other long term (current) drug therapy: Secondary | ICD-10-CM | POA: Diagnosis not present

## 2017-02-26 DIAGNOSIS — J80 Acute respiratory distress syndrome: Secondary | ICD-10-CM | POA: Diagnosis not present

## 2017-02-26 DIAGNOSIS — Z950 Presence of cardiac pacemaker: Secondary | ICD-10-CM | POA: Diagnosis not present

## 2017-02-26 DIAGNOSIS — J189 Pneumonia, unspecified organism: Secondary | ICD-10-CM | POA: Diagnosis not present

## 2017-02-26 DIAGNOSIS — I25110 Atherosclerotic heart disease of native coronary artery with unstable angina pectoris: Secondary | ICD-10-CM | POA: Diagnosis not present

## 2017-02-26 DIAGNOSIS — Z955 Presence of coronary angioplasty implant and graft: Secondary | ICD-10-CM | POA: Diagnosis not present

## 2017-02-26 DIAGNOSIS — B961 Klebsiella pneumoniae [K. pneumoniae] as the cause of diseases classified elsewhere: Secondary | ICD-10-CM | POA: Diagnosis not present

## 2017-02-26 DIAGNOSIS — I252 Old myocardial infarction: Secondary | ICD-10-CM | POA: Diagnosis not present

## 2017-02-26 DIAGNOSIS — I5032 Chronic diastolic (congestive) heart failure: Secondary | ICD-10-CM | POA: Diagnosis not present

## 2017-02-26 DIAGNOSIS — I2510 Atherosclerotic heart disease of native coronary artery without angina pectoris: Secondary | ICD-10-CM | POA: Diagnosis not present

## 2017-02-26 DIAGNOSIS — Z9889 Other specified postprocedural states: Secondary | ICD-10-CM | POA: Diagnosis not present

## 2017-02-26 DIAGNOSIS — I421 Obstructive hypertrophic cardiomyopathy: Secondary | ICD-10-CM | POA: Diagnosis not present

## 2017-02-26 DIAGNOSIS — Z880 Allergy status to penicillin: Secondary | ICD-10-CM | POA: Diagnosis not present

## 2017-02-26 DIAGNOSIS — I129 Hypertensive chronic kidney disease with stage 1 through stage 4 chronic kidney disease, or unspecified chronic kidney disease: Secondary | ICD-10-CM | POA: Diagnosis not present

## 2017-02-26 DIAGNOSIS — R3911 Hesitancy of micturition: Secondary | ICD-10-CM | POA: Diagnosis not present

## 2017-02-26 DIAGNOSIS — E7800 Pure hypercholesterolemia, unspecified: Secondary | ICD-10-CM | POA: Diagnosis not present

## 2017-02-26 DIAGNOSIS — R54 Age-related physical debility: Secondary | ICD-10-CM | POA: Diagnosis not present

## 2017-02-26 DIAGNOSIS — R1312 Dysphagia, oropharyngeal phase: Secondary | ICD-10-CM | POA: Diagnosis not present

## 2017-02-26 DIAGNOSIS — R570 Cardiogenic shock: Secondary | ICD-10-CM | POA: Diagnosis not present

## 2017-02-26 DIAGNOSIS — N183 Chronic kidney disease, stage 3 (moderate): Secondary | ICD-10-CM | POA: Diagnosis not present

## 2017-02-26 DIAGNOSIS — K922 Gastrointestinal hemorrhage, unspecified: Secondary | ICD-10-CM | POA: Diagnosis not present

## 2017-02-26 DIAGNOSIS — M6281 Muscle weakness (generalized): Secondary | ICD-10-CM | POA: Diagnosis not present

## 2017-02-26 DIAGNOSIS — I9589 Other hypotension: Secondary | ICD-10-CM | POA: Diagnosis not present

## 2017-02-26 DIAGNOSIS — I5033 Acute on chronic diastolic (congestive) heart failure: Secondary | ICD-10-CM | POA: Diagnosis not present

## 2017-02-26 DIAGNOSIS — E876 Hypokalemia: Secondary | ICD-10-CM | POA: Diagnosis not present

## 2017-02-26 DIAGNOSIS — I480 Paroxysmal atrial fibrillation: Secondary | ICD-10-CM | POA: Diagnosis not present

## 2017-02-26 DIAGNOSIS — R079 Chest pain, unspecified: Secondary | ICD-10-CM | POA: Diagnosis not present

## 2017-02-26 DIAGNOSIS — I517 Cardiomegaly: Secondary | ICD-10-CM | POA: Diagnosis not present

## 2017-02-26 DIAGNOSIS — Z7901 Long term (current) use of anticoagulants: Secondary | ICD-10-CM | POA: Diagnosis not present

## 2017-02-26 DIAGNOSIS — E8770 Fluid overload, unspecified: Secondary | ICD-10-CM | POA: Diagnosis not present

## 2017-02-26 DIAGNOSIS — I959 Hypotension, unspecified: Secondary | ICD-10-CM | POA: Diagnosis not present

## 2017-02-26 DIAGNOSIS — Y840 Cardiac catheterization as the cause of abnormal reaction of the patient, or of later complication, without mention of misadventure at the time of the procedure: Secondary | ICD-10-CM | POA: Diagnosis not present

## 2017-02-26 DIAGNOSIS — K648 Other hemorrhoids: Secondary | ICD-10-CM | POA: Diagnosis not present

## 2017-02-26 DIAGNOSIS — T8111XD Postprocedural  cardiogenic shock, subsequent encounter: Secondary | ICD-10-CM | POA: Diagnosis not present

## 2017-02-26 DIAGNOSIS — R05 Cough: Secondary | ICD-10-CM | POA: Diagnosis not present

## 2017-02-26 DIAGNOSIS — K219 Gastro-esophageal reflux disease without esophagitis: Secondary | ICD-10-CM | POA: Diagnosis not present

## 2017-02-26 DIAGNOSIS — R600 Localized edema: Secondary | ICD-10-CM | POA: Diagnosis not present

## 2017-02-26 LAB — BASIC METABOLIC PANEL
Anion gap: 8 (ref 5–15)
BUN: 27 mg/dL — AB (ref 6–20)
CHLORIDE: 96 mmol/L — AB (ref 101–111)
CO2: 31 mmol/L (ref 22–32)
CREATININE: 1.58 mg/dL — AB (ref 0.61–1.24)
Calcium: 8.3 mg/dL — ABNORMAL LOW (ref 8.9–10.3)
GFR calc Af Amer: 48 mL/min — ABNORMAL LOW (ref >60)
GFR calc non Af Amer: 41 mL/min — ABNORMAL LOW (ref >60)
GLUCOSE: 115 mg/dL — AB (ref 65–99)
Potassium: 3.5 mmol/L (ref 3.5–5.1)
SODIUM: 135 mmol/L (ref 135–145)

## 2017-02-26 LAB — MAGNESIUM: Magnesium: 2.2 mg/dL (ref 1.7–2.4)

## 2017-02-26 MED ORDER — METOPROLOL TARTRATE 25 MG PO TABS: 12.5000 mg | ORAL_TABLET | Freq: Two times a day (BID) | ORAL | 5 refills | Status: AC

## 2017-02-26 MED ORDER — PANTOPRAZOLE SODIUM 40 MG PO TBEC: 40.0000 mg | DELAYED_RELEASE_TABLET | Freq: Two times a day (BID) | ORAL | 5 refills | Status: AC

## 2017-02-26 MED ORDER — POTASSIUM CHLORIDE CRYS ER 20 MEQ PO TBCR: 40.0000 meq | EXTENDED_RELEASE_TABLET | Freq: Every day | ORAL | 3 refills | Status: AC

## 2017-02-26 MED ORDER — DOCUSATE SODIUM 100 MG PO CAPS: 100.0000 mg | ORAL_CAPSULE | Freq: Every day | ORAL | 0 refills | Status: DC

## 2017-02-26 MED ORDER — LEVOFLOXACIN 500 MG PO TABS: 500.0000 mg | ORAL_TABLET | Freq: Every day | ORAL | 0 refills | Status: AC

## 2017-02-26 MED ORDER — POTASSIUM CHLORIDE CRYS ER 20 MEQ PO TBCR: 40.0000 meq | EXTENDED_RELEASE_TABLET | Freq: Every day | ORAL | 3 refills | Status: DC

## 2017-02-26 MED ORDER — TORSEMIDE 20 MG PO TABS: 20.0000 mg | ORAL_TABLET | Freq: Every day | ORAL | 3 refills | Status: DC

## 2017-02-26 MED ORDER — DILTIAZEM HCL ER COATED BEADS 120 MG PO CP24: 120.0000 mg | ORAL_CAPSULE | Freq: Every day | ORAL | 3 refills | Status: DC

## 2017-02-26 MED ORDER — MIDODRINE HCL 5 MG PO TABS: 15.0000 mg | ORAL_TABLET | Freq: Three times a day (TID) | ORAL | 3 refills | Status: AC

## 2017-02-26 MED ORDER — LEVOFLOXACIN 750 MG PO TABS: 750.0000 mg | ORAL_TABLET | ORAL | Status: DC

## 2017-02-26 MED ORDER — TAMSULOSIN HCL 0.4 MG PO CAPS: 0.4000 mg | ORAL_CAPSULE | Freq: Every day | ORAL | 5 refills | Status: AC

## 2017-02-26 NOTE — Care Management Note (Signed)
Case Management Note Previous CM note completed by Zenon Mayo, RN;;02/11/2017, 4:13 PM    Patient Details  Name: [name] MRN: [id number] Date of Birth: 08/21/1940  Subjective/Objective:  From home alone, presents with GIB, hypertrophic obstructive cardiomyapathy, acute blood loss anemia, afib, co ox is 26.3, weaning epi, he is not a surgical candidate, plan is SNF per pt rec.  CSW following.  NCM spoke with patient to confirm that he is in agreement with SNF placement and he states yes , he is to weak to go home, NCM informed him that will notate.     11/14 [name] RN, BSN - Palliative consulted, patient too debilitated for surgery.  Palliative will meet with other family members to determine code status and ? [place].   11/16 [place], BSN - per palliative note, patient would like to stay full code and go to snf and then try for surgery.                  Action/Plan: NCM will follow along with CSW for dc needs.   Expected Discharge Date:  02/20/2017               Expected Discharge Plan:  [place]  In-House Referral:  Clinical Social Work  Discharge planning Services  CM Consult  Post Acute Care Choice:  NA Choice offered to:  NA  DME Arranged:  N/A DME Agency:  NA  HH Arranged:  NA HH Agency:     Status of Service:  Completed, signed off  If discussed at [employer] of Stay Meetings, dates discussed:    Discharge Disposition: Skilled facility   Additional Comments:  02/28/2017- 1400- William Gibbons RN, CM- pt stable for d/c today- plan for SNF- CSW following for placement needs- plan for [facility]   William Gibbons [place], RN 02/21/2017, 2:02 PM [phone]

## 2017-02-26 NOTE — Plan of Care (Signed)
  Physical Regulation: Ability to maintain clinical measurements within normal limits will improve 02/06/2017 0400 - Progressing by Irish Lack, RN   Physical Regulation: Will remain free from infection 02/10/2017 0400 - Progressing by Irish Lack, RN   Skin Integrity: Risk for impaired skin integrity will decrease 02/17/2017 0400 - Progressing by Irish Lack, RN

## 2017-02-26 NOTE — Progress Notes (Signed)
Clinical Social Worker facilitated patient discharge including contacting patient family and facility to confirm patient discharge plans.  Clinical information faxed to facility and family agreeable with plan.  CSW arranged ambulance transport via [facility] to [facility] .  RN to call [phone] (pt will go in room 1205P) for report prior to discharge.  Clinical Social Worker will sign off for now as social work intervention is no longer needed. Please consult [place] again if new need arises.  Rhea Pink, MSW, [place]

## 2017-02-26 NOTE — Discharge Summary (Signed)
Advanced Heart Failure Team  Discharge Summary   Patient ID: [name] MRN: [id number], DOB/AGE: [birth year] 76 y.o. Admit date: 02/01/2017 D/C date:     03/05/2017   Primary Discharge Diagnoses:  1. Cardiogenic shockin setting of severe HOCM 2. Acute GI bleed with symptomatic anemia  3. Paroxysmal Afib s/p AV nodal ablation with BiV pacing.  4. CAD:   - Coronary angiography 11/16 with patent ostial and mid LAD stents.    5. VT:  -Torsades on 11/9,  due to disopyramide 6. Frailty/deconditioning:  - PT recommending SNF 7. Pneumonia, hospital acquired -sputum culture with moraxella catarrhalis 8. UTI, klebsiella pneumoniae 9. Urinary hesitancy - Flomax started 11/21. 10. Hypotension  11. 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 Acute on chronic renal failure, stage 3 - baseline creatine 1.4-1.6 14. Hypokalemia   Hospital Course:   [name] is a 76 y.o. male with medical history significant of severe HOCM, CAD s/p previous LAD stents, atrial fib s/p ablation and Biv pacemaker in 7/18.  He has been followed by Dr. Einar Gip.   He has a h/o R hemicolectomy due to TV adenoma. He had both EGD and colonoscopy on 10/26 as outpatient for IDA. Scopes showed large internal hemorrhoids and a normal ileocolonic anastomosis and the EGD showed antral gastritis that were biopsied. Eliquis restarted after procedure. He presented to ER on 10/29 with melena and hypotension. Eliquis held. He was seen by Dr. Michail Sermon in GI. He received 1u RBCs for hgb 9.0. He was otherwise managed medically without repeat scope. Eliquis was restarted after 1 week without recurrent bleeding. He was maintained on PPI.   BP remained in the 70-90 range. Echo done on 11/1 Showed EF 65-70% with severe HOCM with peak LVOT gradient of 162mmHG and SAM. He was seen by Drs. Ganji and Patwardhan for ongoing hypotension and volume  overload. His lactic acid began to trend up.  On 11/6 taken for R/L heart cath by Dr  Virgina Jock.   Cath showed: 1) Non-obstructive CAD with patent LAD stents 2) Severe LVOT gradient of 122mm HG with no change with low-dose neosynephrine gtt 3) Cardiogenic shock with MV sat 29% and Fick CI 1.6L  (RA 12, PA 76/25 (40), PCWP 20)  A neosynephrine gtt was started and he was moved to the ICU and the HF team was consulted. The case was discussed with Dr. Mina Marble in the [facility]. Cath films reviewed and patient not felt to be good candidate for alcohol septal ablation due to poor septal anatomy. Seen by Dr. Prescott Gum.  Ideally would have myectomy + aortic valve replacement + mitral valve repair but not stable for this and seems to have very poor reserve. B-blocker and CCB adjusted. Pacemaker interrogated and found to have NSR ~70% of the time. Pacing strategies adjusted with echo guidance by Dr. Caryl Comes, he was left with LV only pacing (lowest LVOT gradient in this mode). Midodrine was started and William eventually weaned with SBP in 80-90 range and MV sats in 40% range. He was started on disopyramide but had torsades x 2 and this was stopped with no further torsades.   While in hospital he was seen by Palliative Care team. Remained full code to see if he would improve with supportive care.   He remained markedly volume overloaded and debilitated but was gently diuresed with IV lasix and then po trosemide and thigh  high-leg stockings placed with resolution of his edema. He was seen by PT and SNF recommended. Weight on d/c was 178 pounds.   He also developed UTI with klebsiella pneumonia and pneumonia with moraxella cattarhalis. Levaquin stated on 11/20 for 7-day course. He was also started on Flomacx for urinary retention.   On 11/23 was transferred to [facility] for rehab. He will f/u in HF Clinic to see if he can get strong enough for surgery.    D/c Meds: Apixaban 5 bid Diltiazem 120  daily Colace 100 daily Levaquin 500 daily last dose 11/26 Metoprolol tartrate 12.5 bid Midodrine 15 tid Protonix 40 bid Kcl 40 daily Torsemide 20 daily - take extra 20 mg if weight 181 pounds or more Flomax 0.4 mg daily  Discharge Weight Range: 178 Discharge Vitals: Blood pressure 94/64, pulse 60, temperature 97.9 F (36.6 C), temperature source Oral, resp. rate 18, height 6' (1.829 m), weight 178 lb 6.4 oz (80.9 kg), SpO2 94 %.  General:  Sitting in chair. No resp difficulty HEENT: normal Neck: supple. JVP 7-8. Carotids 2+ bilat; + bialt radiated bruits. No lymphadenopathy or thryomegaly appreciated. Cor: PMI nondisplaced. Regular rate & rhythm. 3/6 SEM across precordium  Lungs: clear Abdomen: soft, nontender, nondistended. No hepatosplenomegaly. No bruits or masses. Good bowel sounds. Extremities: no cyanosis, clubbing, rash, tr edema thigh -high TED stockings Neuro: alert & orientedx3, cranial nerves grossly intact. moves all 4 extremities w/o difficulty. Affect pleasant   Labs: Lab Results  Component Value Date   WBC 8.6 02/23/2017   HGB 9.3 (L) 02/23/2017   HCT 31.4 (L) 02/23/2017   MCV 96.0 02/23/2017   PLT 146 (L) 02/23/2017    Recent Labs  Lab 02/05/2017 0245  NA 135  K 3.5  CL 96*  CO2 31  BUN 27*  CREATININE 1.58*  CALCIUM 8.3*  GLUCOSE 115*   Lab Results  Component Value Date   CHOL  04/15/2010    136        ATP III CLASSIFICATION:  <200     mg/dL   Desirable  200-239  mg/dL   Borderline High  >=240    mg/dL   High          HDL 37 (L) 04/15/2010   LDLCALC  04/15/2010    90        Total Cholesterol/HDL:CHD Risk Coronary Heart Disease Risk Table                     Men   Women  1/2 Average Risk   3.4   3.3  Average Risk       5.0   4.4  2 X Average Risk   9.6   7.1  3 X Average Risk  23.4   11.0        Use the calculated Patient Ratio above and the CHD Risk Table to determine the patient's CHD Risk.        ATP III CLASSIFICATION (LDL):  <100      mg/dL   Optimal  100-129  mg/dL   Near or Above                    Optimal  130-159  mg/dL   Borderline  160-189  mg/dL   High  >190     mg/dL   Very High   TRIG 44 04/15/2010   BNP (last 3 results) No results for input(s): BNP in the last 8760 hours.  ProBNP (last 3 results) No results for input(s): PROBNP in the last 8760 hours.   Diagnostic Studies/Procedures   See above  Discharge Medications   Allergies as of 02/27/2017      Reactions   Disopyramide    Possibly contributed to Torsades   Adhesive [tape] Itching, Rash   (The patches affected his back, when he had his ablation)   Brilinta [ticagrelor] Itching, Rash   Penicillins Rash   Has patient had a PCN reaction causing immediate rash, facial/tongue/throat swelling, SOB or lightheadedness with hypotension: Yes Has patient had a PCN reaction causing severe rash involving mucus membranes or skin necrosis: Yes Has patient had a PCN reaction that required hospitalization No Has patient had a PCN reaction occurring within the last 10 years: No If all of the above answers are "NO", then may proceed with Cephalosporin use.      Medication List    STOP taking these medications   furosemide 20 MG tablet Commonly known as:  LASIX   HAWTHORN BERRY PO   HORSE CHESTNUT PO   metoprolol succinate 25 MG 24 hr tablet Commonly known as:  TOPROL-XL   omeprazole 20 MG capsule Commonly known as:  PRILOSEC   pravastatin 40 MG tablet Commonly known as:  PRAVACHOL   saw palmetto 160 MG capsule   triamcinolone cream 0.1 % Commonly known as:  KENALOG   vitamin E 400 UNIT capsule   VITAMIN K PO     TAKE these medications   albuterol 108 (90 Base) MCG/ACT inhaler Commonly known as:  PROVENTIL HFA;VENTOLIN HFA Inhale 1-2 puffs into the lungs every 6 (six) hours as needed for wheezing or shortness of breath.   apixaban 5 MG Tabs tablet Commonly known as:  ELIQUIS Take 5 mg by mouth 2 (two) times daily.   b  complex vitamins tablet Take 1 tablet by mouth daily.   Bilberry 1000 MG Caps Take 1,000 mg by mouth daily.   diltiazem 120 MG 24 hr capsule Commonly known as:  CARDIZEM CD Take 1 capsule (120 mg total) by mouth at bedtime.   diphenhydrAMINE 25 MG tablet Commonly known as:  BENADRYL Take 25 mg by mouth daily as needed for itching.   docusate sodium 100 MG capsule Commonly known as:  COLACE Take 1 capsule (100 mg total) by mouth daily. Start taking on:  02/27/2017   EQL COQ10 300 MG Caps Generic drug:  Coenzyme Q10 Take 1 capsule by mouth every morning.   Glucosamine HCl 1000 MG Tabs Take 1,000 mg by mouth 2 (two) times daily.   hydrocortisone cream 1 % Apply 1 application topically daily as needed for itching.   imiquimod 5 % cream Commonly known as:  ALDARA Apply 1 application topically at bedtime.   ipratropium 0.06 % nasal spray Commonly known as:  ATROVENT 2 sprays into each nostril 4 times a day as needed for runny nose. What changed:    how much to take  how to take this  when to take this  reasons to take this  additional instructions   Iron 325 (65 Fe) MG Tabs Take 325 mg by mouth 2 (two) times daily.   levofloxacin 500 MG tablet Commonly known as:  LEVAQUIN Take 1 tablet (500 mg total) by mouth daily. Start taking on:  02/27/2017   metoprolol tartrate 25 MG tablet Commonly known as:  LOPRESSOR Take 0.5 tablets (12.5 mg total) by mouth 2 (two) times daily.   midodrine 5 MG tablet Commonly known as:  PROAMATINE Take 3 tablets (15 mg total) by mouth 3 (three) times daily with meals.   multivitamin with minerals Tabs tablet Take 1 tablet by mouth every morning.   pantoprazole 40 MG tablet Commonly known as:  PROTONIX Take 1 tablet (40 mg total) by mouth 2 (two) times daily.   potassium chloride SA 20 MEQ tablet Commonly known as:  K-DUR,KLOR-CON Take 2 tablets (40 mEq total) by mouth daily.   SYSTANE 0.4-0.3 % Soln Generic drug:   Polyethyl Glycol-Propyl Glycol Place 1 drop into both eyes 2 (two) times daily.   tamsulosin 0.4 MG Caps capsule Commonly known as:  FLOMAX Take 1 capsule (0.4 mg total) by mouth daily after supper.   torsemide 20 MG tablet Commonly known as:  DEMADEX Take 1 tablet (20 mg total) by mouth daily. With additional 20mg  if weight is 181 lbs or more. Start taking on:  02/27/2017   vitamin C 500 MG tablet Commonly known as:  ASCORBIC ACID Take 500 mg by mouth 2 (two) times daily.   VITAMIN D PO Take 1 capsule by mouth daily.       Disposition   The patient will be discharged in stable condition to home.   Contact information for follow-up providers    [name], MD. Schedule an appointment as soon as possible for a visit in 1 week(s).   Specialty:  Internal Medicine Contact information: [address] [place] [address] [phone]        Adrian Prows, MD. Schedule an appointment as soon as possible for a visit.   Specialty:  Cardiology Contact information: [place] [address] [phone]            Contact information for after-discharge care    Destination    [facility] .   Service:  Skilled Nursing Contact information: [place] [phone]                    Duration of Discharge Encounter: Greater than 35 minutes   Glori Bickers, MD  12:18 PM

## 2017-02-26 NOTE — Progress Notes (Signed)
Called report to nurse Quillian Quince at [facility].  VS stable and patient educated of medications and care prior to discharge.

## 2017-03-03 ENCOUNTER — Encounter (HOSPITAL_COMMUNITY): Payer: Self-pay

## 2017-03-03 ENCOUNTER — Ambulatory Visit (HOSPITAL_COMMUNITY)
Admission: RE | Admit: 2017-03-03 | Discharge: 2017-03-03 | Disposition: A | Discharge status: Home / Self Care | Payer: No Typology Code available for payment source | Source: Ambulatory Visit | Attending: Internal Medicine | Admitting: Internal Medicine

## 2017-03-03 VITALS — BP 104/52 | HR 58 | Wt 178.0 lb

## 2017-03-03 DIAGNOSIS — R42 Dizziness and giddiness: Secondary | ICD-10-CM | POA: Diagnosis not present

## 2017-03-03 DIAGNOSIS — N183 Chronic kidney disease, stage 3 unspecified: Secondary | ICD-10-CM | POA: Insufficient documentation

## 2017-03-03 DIAGNOSIS — Z9889 Other specified postprocedural states: Secondary | ICD-10-CM | POA: Diagnosis not present

## 2017-03-03 DIAGNOSIS — I480 Paroxysmal atrial fibrillation: Secondary | ICD-10-CM | POA: Diagnosis not present

## 2017-03-03 DIAGNOSIS — K922 Gastrointestinal hemorrhage, unspecified: Secondary | ICD-10-CM | POA: Diagnosis not present

## 2017-03-03 DIAGNOSIS — E8770 Fluid overload, unspecified: Secondary | ICD-10-CM | POA: Insufficient documentation

## 2017-03-03 DIAGNOSIS — N390 Urinary tract infection, site not specified: Secondary | ICD-10-CM | POA: Insufficient documentation

## 2017-03-03 DIAGNOSIS — J189 Pneumonia, unspecified organism: Secondary | ICD-10-CM | POA: Diagnosis not present

## 2017-03-03 DIAGNOSIS — I959 Hypotension, unspecified: Secondary | ICD-10-CM | POA: Insufficient documentation

## 2017-03-03 DIAGNOSIS — I9589 Other hypotension: Secondary | ICD-10-CM | POA: Diagnosis not present

## 2017-03-03 DIAGNOSIS — Z880 Allergy status to penicillin: Secondary | ICD-10-CM | POA: Insufficient documentation

## 2017-03-03 DIAGNOSIS — I421 Obstructive hypertrophic cardiomyopathy: Secondary | ICD-10-CM

## 2017-03-03 DIAGNOSIS — Z955 Presence of coronary angioplasty implant and graft: Secondary | ICD-10-CM | POA: Insufficient documentation

## 2017-03-03 DIAGNOSIS — Z7901 Long term (current) use of anticoagulants: Secondary | ICD-10-CM | POA: Insufficient documentation

## 2017-03-03 DIAGNOSIS — R0602 Shortness of breath: Secondary | ICD-10-CM | POA: Insufficient documentation

## 2017-03-03 DIAGNOSIS — Z79899 Other long term (current) drug therapy: Secondary | ICD-10-CM | POA: Insufficient documentation

## 2017-03-03 DIAGNOSIS — Z950 Presence of cardiac pacemaker: Secondary | ICD-10-CM | POA: Diagnosis not present

## 2017-03-03 DIAGNOSIS — I5032 Chronic diastolic (congestive) heart failure: Secondary | ICD-10-CM | POA: Diagnosis not present

## 2017-03-03 DIAGNOSIS — R54 Age-related physical debility: Secondary | ICD-10-CM | POA: Insufficient documentation

## 2017-03-03 DIAGNOSIS — Z888 Allergy status to other drugs, medicaments and biological substances status: Secondary | ICD-10-CM | POA: Insufficient documentation

## 2017-03-03 DIAGNOSIS — B961 Klebsiella pneumoniae [K. pneumoniae] as the cause of diseases classified elsewhere: Secondary | ICD-10-CM | POA: Insufficient documentation

## 2017-03-03 DIAGNOSIS — I252 Old myocardial infarction: Secondary | ICD-10-CM | POA: Insufficient documentation

## 2017-03-03 DIAGNOSIS — Z91048 Other nonmedicinal substance allergy status: Secondary | ICD-10-CM | POA: Insufficient documentation

## 2017-03-03 DIAGNOSIS — K219 Gastro-esophageal reflux disease without esophagitis: Secondary | ICD-10-CM | POA: Insufficient documentation

## 2017-03-03 DIAGNOSIS — R570 Cardiogenic shock: Secondary | ICD-10-CM | POA: Diagnosis not present

## 2017-03-03 DIAGNOSIS — K648 Other hemorrhoids: Secondary | ICD-10-CM | POA: Insufficient documentation

## 2017-03-03 DIAGNOSIS — Z8249 Family history of ischemic heart disease and other diseases of the circulatory system: Secondary | ICD-10-CM | POA: Insufficient documentation

## 2017-03-03 DIAGNOSIS — I2510 Atherosclerotic heart disease of native coronary artery without angina pectoris: Secondary | ICD-10-CM | POA: Insufficient documentation

## 2017-03-03 DIAGNOSIS — E876 Hypokalemia: Secondary | ICD-10-CM | POA: Insufficient documentation

## 2017-03-03 DIAGNOSIS — I517 Cardiomegaly: Secondary | ICD-10-CM

## 2017-03-03 DIAGNOSIS — I129 Hypertensive chronic kidney disease with stage 1 through stage 4 chronic kidney disease, or unspecified chronic kidney disease: Secondary | ICD-10-CM | POA: Insufficient documentation

## 2017-03-03 DIAGNOSIS — E7800 Pure hypercholesterolemia, unspecified: Secondary | ICD-10-CM | POA: Insufficient documentation

## 2017-03-03 DIAGNOSIS — R3911 Hesitancy of micturition: Secondary | ICD-10-CM | POA: Insufficient documentation

## 2017-03-03 LAB — CBC
HCT: 33.8 % — ABNORMAL LOW (ref 39.0–52.0)
Hemoglobin: 10.2 g/dL — ABNORMAL LOW (ref 13.0–17.0)
MCH: 29.1 pg (ref 26.0–34.0)
MCHC: 30.2 g/dL (ref 30.0–36.0)
MCV: 96.6 fL (ref 78.0–100.0)
PLATELETS: 141 10*3/uL — AB (ref 150–400)
RBC: 3.5 MIL/uL — AB (ref 4.22–5.81)
RDW: 18.5 % — AB (ref 11.5–15.5)
WBC: 4.3 10*3/uL (ref 4.0–10.5)

## 2017-03-03 LAB — BASIC METABOLIC PANEL
Anion gap: 9 (ref 5–15)
BUN: 25 mg/dL — AB (ref 6–20)
CALCIUM: 8.6 mg/dL — AB (ref 8.9–10.3)
CHLORIDE: 100 mmol/L — AB (ref 101–111)
CO2: 29 mmol/L (ref 22–32)
CREATININE: 1.51 mg/dL — AB (ref 0.61–1.24)
GFR calc non Af Amer: 44 mL/min — ABNORMAL LOW (ref >60)
GFR, EST AFRICAN AMERICAN: 51 mL/min — AB (ref >60)
Glucose, Bld: 119 mg/dL — ABNORMAL HIGH (ref 65–99)
Potassium: 3.7 mmol/L (ref 3.5–5.1)
SODIUM: 138 mmol/L (ref 135–145)

## 2017-03-03 LAB — MAGNESIUM: MAGNESIUM: 2.2 mg/dL (ref 1.7–2.4)

## 2017-03-03 MED ORDER — TORSEMIDE 20 MG PO TABS: 20.0000 mg | ORAL_TABLET | Freq: Two times a day (BID) | ORAL | 3 refills | Status: AC

## 2017-03-03 NOTE — Progress Notes (Signed)
Advanced Heart Failure Clinic Note   Primary Cardiologist: Dr. Einar Gip HF: Dr. Haroldine Laws   HPI:  [name] is a 76 y.o. male with medical history significant of severe HOCM,CAD s/p previous LAD stents, atrial fib s/p ablation and Biv pacemaker in 7/18. He has been followed by Dr. Einar Gip.   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  EGD/Colon 10/26 as outpatient for IDA with  large internal hemorrhoids and a normal ileocolonic anastomosis and the EGD showed antral gastritis that were biopsied. Eliquis restarted after procedure. He presented to ER on 10/29 with melena and hypotension. Eliquis held. He was seen by Dr. Michail Sermon in GI. He received 1u RBCs for hgb 9.0. He was otherwise managed medically without repeat scope. Eliquis was restarted after 1 week without recurrent bleeding. He was maintained on PPI.   BP remained in the 70-90 range. Echo done on 11/1 Showed EF 65-70% with severe HOCM with peak LVOT gradient of 122mmHG and SAM. He was seen by Drs. Ganji and Patwardhan for ongoing hypotension and volume overload. His lactic acid began to trend up.  Neosynephrine gtt started and moved to ICU. HF team consulted and discussed with Dr. Mina Marble in the [facility]. Cath films reviewed and patient not felt to be good candidate for alcohol septal ablation due to poor septal anatomy. Pacemaker interrogated and found to have NSR ~70% of the time. Pacing strategies adjusted with echo guidance byDr. Caryl Comes, he was left with LV only pacing (lowest LVOT gradient in this mode). Midodrine was started and Neo eventually weaned with SBP in 80-90 range and MV sats in 40% range. He was started on disopyramide but had torsades x 2 and this was stopped with no further torsades.   He remained markedly volume overloaded and debilitated but was gently diuresed with IV lasix and then po trosemide and thigh high-leg stockings placed with resolution of his edema. He was seen by PT and SNF recommended.  Weight on d/c was 178 pounds. Discharged to [facility] for rehab.   He presents today for post hospital follow up. Initially felt OK but had lightheadedness earlier this week. Reports systolic BP 81 on Monday. Felt sluggish and swollen yesterday, so took extra torsemide with some relief. The only lightheadedness he has had was earlier this week. He is SOB doing "anything more than standing up". OK with transfers, but SOB with showering and changing clothes. Mild orthopnea. Remains fatigued.  Feels like his urine output was better on torsemide BID.   Review of systems complete and found to be negative unless listed in HPI.    Echo 02/04/2017 LVEF 65-70%, severe LVH, "extremely" high gradient across the aortic valve with severe AS and mod MR, Mod/Sev MR, Severe LAE, Mod RAE, Mod TR, Mod PR, Pa peak pressure 72 mm Hg.  R/LHC 02/09/2017  by Dr  Virgina Jock.   Cath showed: 1) Non-obstructive CAD with patent LAD stents 2) Severe LVOT gradient of 160mm HG with no change with low-dose neosynephrine gtt 3) Cardiogenic shock with MV sat 29% and Fick CI 1.6L  (RA 12, PA 76/25 (40), PCWP 20)   Past Medical History:  Diagnosis Date  . Anemia   . Arthritis    "little in my fingers" (10/21/2016)  . Chronic lower back pain    "last 3 months; usually when I bend" (12/24/2011)  . Coronary artery disease   . Endocarditis   . GERD (gastroesophageal reflux disease)   . Heart murmur   .  High cholesterol   . Hypertension   . Migraines    "none in years" (10/21/2016)  . Myocardial infarction [facility])    POSSIBLE 2012 ELEVATED ENZYMES  . Pneumonia ~ 2011  . Presence of permanent cardiac pacemaker     Current Outpatient Medications  Medication Sig Dispense Refill  . albuterol (PROVENTIL HFA;VENTOLIN HFA) 108 (90 Base) MCG/ACT inhaler Inhale 1-2 puffs into the lungs every 6 (six) hours as needed for wheezing or shortness of breath. 1 Inhaler 0  . apixaban (ELIQUIS) 5 MG TABS tablet Take 5 mg by mouth 2  (two) times daily.    . Ascorbic Acid (VITAMIN C) 500 MG tablet Take 500 mg by mouth 2 (two) times daily.      Marland Kitchen b complex vitamins tablet Take 1 tablet by mouth daily.    . Cholecalciferol (VITAMIN D PO) Take 1 capsule by mouth daily.    Marland Kitchen diltiazem (CARDIZEM CD) 120 MG 24 hr capsule Take 1 capsule (120 mg total) by mouth at bedtime. 90 capsule 3  . diphenhydrAMINE (BENADRYL) 25 MG tablet Take 25 mg by mouth daily as needed for itching.    . Ferrous Sulfate (IRON) 325 (65 Fe) MG TABS Take 325 mg by mouth 2 (two) times daily.     . Glucosamine HCl 1000 MG TABS Take 1,000 mg by mouth 2 (two) times daily.     . hydrocortisone cream 1 % Apply 1 application topically daily as needed for itching.    . imiquimod (ALDARA) 5 % cream Apply 1 application topically at bedtime.  1  . ipratropium (ATROVENT) 0.06 % nasal spray 2 sprays into each nostril 4 times a day as needed for runny nose. (Patient taking differently: Place 2 sprays into both nostrils 4 (four) times daily as needed for rhinitis. ) 15 mL 12  . levofloxacin (LEVAQUIN) 500 MG tablet Take 1 tablet (500 mg total) by mouth daily. 3 tablet 0  . metoprolol tartrate (LOPRESSOR) 25 MG tablet Take 0.5 tablets (12.5 mg total) by mouth 2 (two) times daily. 30 tablet 5  . midodrine (PROAMATINE) 5 MG tablet Take 3 tablets (15 mg total) by mouth 3 (three) times daily with meals. 270 tablet 3  . Multiple Vitamin (MULTIVITAMIN WITH MINERALS) TABS Take 1 tablet by mouth every morning.     . pantoprazole (PROTONIX) 40 MG tablet Take 1 tablet (40 mg total) by mouth 2 (two) times daily. 60 tablet 5  . Polyethyl Glycol-Propyl Glycol (SYSTANE) 0.4-0.3 % SOLN Place 1 drop into both eyes 2 (two) times daily.     . potassium chloride SA (K-DUR,KLOR-CON) 20 MEQ tablet Take 2 tablets (40 mEq total) by mouth daily. 60 tablet 3  . tamsulosin (FLOMAX) 0.4 MG CAPS capsule Take 1 capsule (0.4 mg total) by mouth daily after supper. 30 capsule 5  . torsemide (DEMADEX) 20 MG  tablet Take 1 tablet (20 mg total) by mouth daily. With additional 20mg  if weight is 181 lbs or more. 35 tablet 3   No current facility-administered medications for this encounter.     Allergies  Allergen Reactions  . Disopyramide     Possibly contributed to Torsades  . Adhesive [Tape] Itching and Rash    (The patches affected his back, when he had his ablation)  . Brilinta [Ticagrelor] Itching and Rash  . Penicillins Rash    Has patient had a PCN reaction causing immediate rash, facial/tongue/throat swelling, SOB or lightheadedness with hypotension: Yes Has patient had a PCN reaction causing  severe rash involving mucus membranes or skin necrosis: Yes Has patient had a PCN reaction that required hospitalization No Has patient had a PCN reaction occurring within the last 10 years: No If all of the above answers are "NO", then may proceed with Cephalosporin use.    Social History   Socioeconomic History  . Marital status: Widowed    Spouse name: Not on file  . Number of children: Not on file  . Years of education: Not on file  . Highest education level: Not on file  Social Needs  . Financial resource strain: Not on file  . Food insecurity - worry: Not on file  . Food insecurity - inability: Not on file  . Transportation needs - medical: Not on file  . Transportation needs - non-medical: Not on file  Occupational History  . Not on file  Tobacco Use  . Smoking status: Never Smoker  . Smokeless tobacco: Never Used  . Tobacco comment: 12/24/2011 "used to puff cigarettes; never inhaled"  Substance and Sexual Activity  . Alcohol use: Yes    Comment: 10/21/2016 "don't drink at all anymore; used to have a beer q 3-4 months"  . Drug use: No  . Sexual activity: No  Other Topics Concern  . Not on file  Social History Narrative  . Not on file      Family History  Problem Relation Age of Onset  . Hypertension Mother   . Heart failure Father   . Cancer Neg Hx   . Diabetes Neg  Hx   . Hyperlipidemia Neg Hx   . Sudden death Neg Hx   . Stroke Neg Hx   . Heart attack Neg Hx     Vitals:   03/03/17 0904  BP: (!) 104/52  Pulse: (!) 58  SpO2: 100%  Weight: 178 lb (80.7 kg)   Wt Readings from Last 3 Encounters:  03/03/17 178 lb (80.7 kg)  02/25/17 178 lb 6.4 oz (80.9 kg)  10/29/16 197 lb (89.4 kg)   PHYSICAL EXAM: General:  Elderly and fatigued appearing. No respiratory difficulty HEENT: normal Neck: supple. JVP 8-9 cm + JVD. Carotids 2+ bilat; no bruits. No lymphadenopathy or thyromegaly appreciated. Cor: PMI nondisplaced. Regular rate & rhythm. 2/6 SEM across precordium Lungs: Mildly diminished basilar sounds.  Abdomen: soft, nontender, nondistended. No hepatosplenomegaly. No bruits or masses. Good bowel sounds. Extremities: no cyanosis, clubbing, or rash. 2+ edema to knees. Ted hose in place.  Neuro: alert & oriented x 3, cranial nerves grossly intact. moves all 4 extremities w/o difficulty. Affect pleasant.  ASSESSMENT & PLAN:  1. Severe HOCM  - Complicated anatomy/physiology with probably moderate aortic stenosis and moderate aortic insufficiency, severe LVOT gradient from HOCM, and moderate to severe MR.   - Pacemaker adjusted by Dr. Caryl Comes and Dr. Haroldine Laws. Now LV pacing only. (lowest LVOT in this mode).  - Has been seen by Dr. Prescott Gum. Ideally would have myectomy + aortic valve replacement + mitral valve repair but not stable for this and seems to have very poor reserve.   - Stop diltiazem. Ideally would be on with HOCM, but with volume overload and lightheadedness need to make room for diuresis.  - Continue toprol 12.5 mg BID for now.  - Off disopyramide with torsades 2. 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  - Will increase torsemide to 20 mg BID for now. Will need to watch closely. BMET today.  3. Recent GI bleed with symptomatic anemia  - No further  bleeding. CBC today.  - Continue PPI 4. Paroxysmal Afib s/p AV nodal ablation with BiV pacing.  - Rate controlled.  - No bleeding on Eliquis at this time. CBC today.  5. CAD:  - Coronary angiography 11/16 with patent ostial and mid LAD stents.  6. VT:  -Torsades on 11/9, due to disopyramide - No further noted.  7. Frailty/deconditioning: - PT recommending SNF 8. UTI, klebsiella pneumoniae - Completing ABX.  9. Urinary hesitancy -Flomaxstarted 11/21. Improved.  10. Hypotension  - Continue midodrine 15 mg TID.  11. 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 CKD stage III - BMET today.  13. Hypokalemia - BMET today.   At least mildly volume overloaded but very SOB. Multi-factorial with his severe HOCM, MR, and diastolic CHF. Will cautiously increase diuretics as above and keep close 2 week follow up. Labs today.   Overall, remains very tenuous.   Shirley Friar, PA-C 03/03/17   Patient seen and examined with the above-signed Advanced Practice Provider and/or Housestaff. I personally reviewed laboratory data, imaging studies and relevant notes. I independently examined the patient and formulated the important aspects of the plan. I have edited the note to reflect any of my changes or salient points. I have personally discussed the plan with the patient and/or family.  Overall relatively stable but he feels current diuretic dose is not sufficient. I explained that with his HOCM, he will not tolerate aggressive diuresis very well. That said we will try to increase diuretics gently. Can stop diltiazem. Check labs today. Suspect we will not get him to myectomy and will need to focus on comfort. Will continue rehab for now. REDS reading 37-38% range c/w mild lung congestion.   Glori Bickers, MD  11:04 PM

## 2017-03-03 NOTE — Progress Notes (Signed)
Advanced Heart Failure Medication Review by a Pharmacist  Does the patient  feel that his/her medications are working for him/her?  No - increased swelling lately   Has the patient been experiencing any side effects to the medications prescribed?  Yes - had a low BP (~80/40 mmHg) on Monday   Does the patient measure his/her own blood pressure or blood glucose at home?  Yes - Monitored at nursing home    Does the patient have any problems obtaining medications due to transportation or finances?   no  Understanding of regimen: fair Understanding of indications: good Potential of compliance: fair Patient understands to avoid NSAIDs. Patient understands to avoid decongestants.  Issues to address at subsequent visits: No   Pharmacist comments: [name] is a pleasant 76 year old Caucasian male presenting today with his medication list from his nursing home. He has not taken his morning doses of medications today. He expressed concern of increased swelling in his legs and a dizziness/lighheadedness on Monday. He had no additional medication related questions or concerns at this time.    Time with patient: 10 Preparation and documentation time: 2 Total time: 12

## 2017-03-03 NOTE — Patient Instructions (Signed)
Routine lab work today. Will notify you of abnormal results, otherwise no news is good news!  STOP Diltiazem.  INCREASE Torsemide to 20 mg tablet twice daily.  Follow up 2 weeks with Oda Kilts PA-C.  Take all medication as prescribed the day of your appointment. Bring all medications with you to your appointment.  Do the following things EVERYDAY: 1) Weigh yourself in the morning before breakfast. Write it down and keep it in a log. 2) Take your medicines as prescribed 3) Eat low salt foods-Limit salt (sodium) to 2000 mg per day.  4) Stay as active as you can everyday 5) Limit all fluids for the day to less than 2 liters

## 2017-03-05 DIAGNOSIS — I959 Hypotension, unspecified: Secondary | ICD-10-CM | POA: Diagnosis not present

## 2017-03-06 DIAGNOSIS — 419620001 Death: Secondary | SNOMED CT | POA: Diagnosis not present

## 2017-03-06 DEATH — deceased

## 2017-03-08 DIAGNOSIS — R600 Localized edema: Secondary | ICD-10-CM | POA: Diagnosis not present

## 2017-03-12 DIAGNOSIS — R05 Cough: Secondary | ICD-10-CM | POA: Diagnosis not present

## 2017-03-15 ENCOUNTER — Telehealth (HOSPITAL_COMMUNITY): Payer: Self-pay | Admitting: *Deleted

## 2017-03-15 NOTE — Telephone Encounter (Signed)
received below message from answering service.  Will let Dr Haroldine Laws know.  Message For: OFFICE               Taken  9-DEC-18 at 11:51AM by VLM ------------------------------------------------------------ [name]             CID  [id number]   If Hospital, From Emerg Rm     Or Floor             Patient [name]                            Pt's DOB [phone] * Pt's Dr NOT SURE              Area Code & Phone#< [phone]       # >                  Press TAB To Continue             RE PT EXPIRED 12/9; HAD AN APPT SCHEDULED FOR        12/12, WANTS TO LET THE DOCTOR KNOW                  Disp:Y/N N/FILE Y = C/B In 52min If No Response

## 2017-03-17 ENCOUNTER — Encounter (HOSPITAL_COMMUNITY): Payer: Medicare Other

## 2017-04-06 DIAGNOSIS — 419620001 Death: Secondary | SNOMED CT | POA: Diagnosis not present

## 2017-04-06 DEATH — deceased

## 2017-11-24 IMAGING — DX DG CHEST 2V
2 series · 2 of 2 positions shown · non-contrast
Comparison: 10/26/2015; 01/13/2015; 12/17/2011

CLINICAL DATA: Heart arrhythmia

EXAM:
CHEST  2 VIEW

[chest pa]
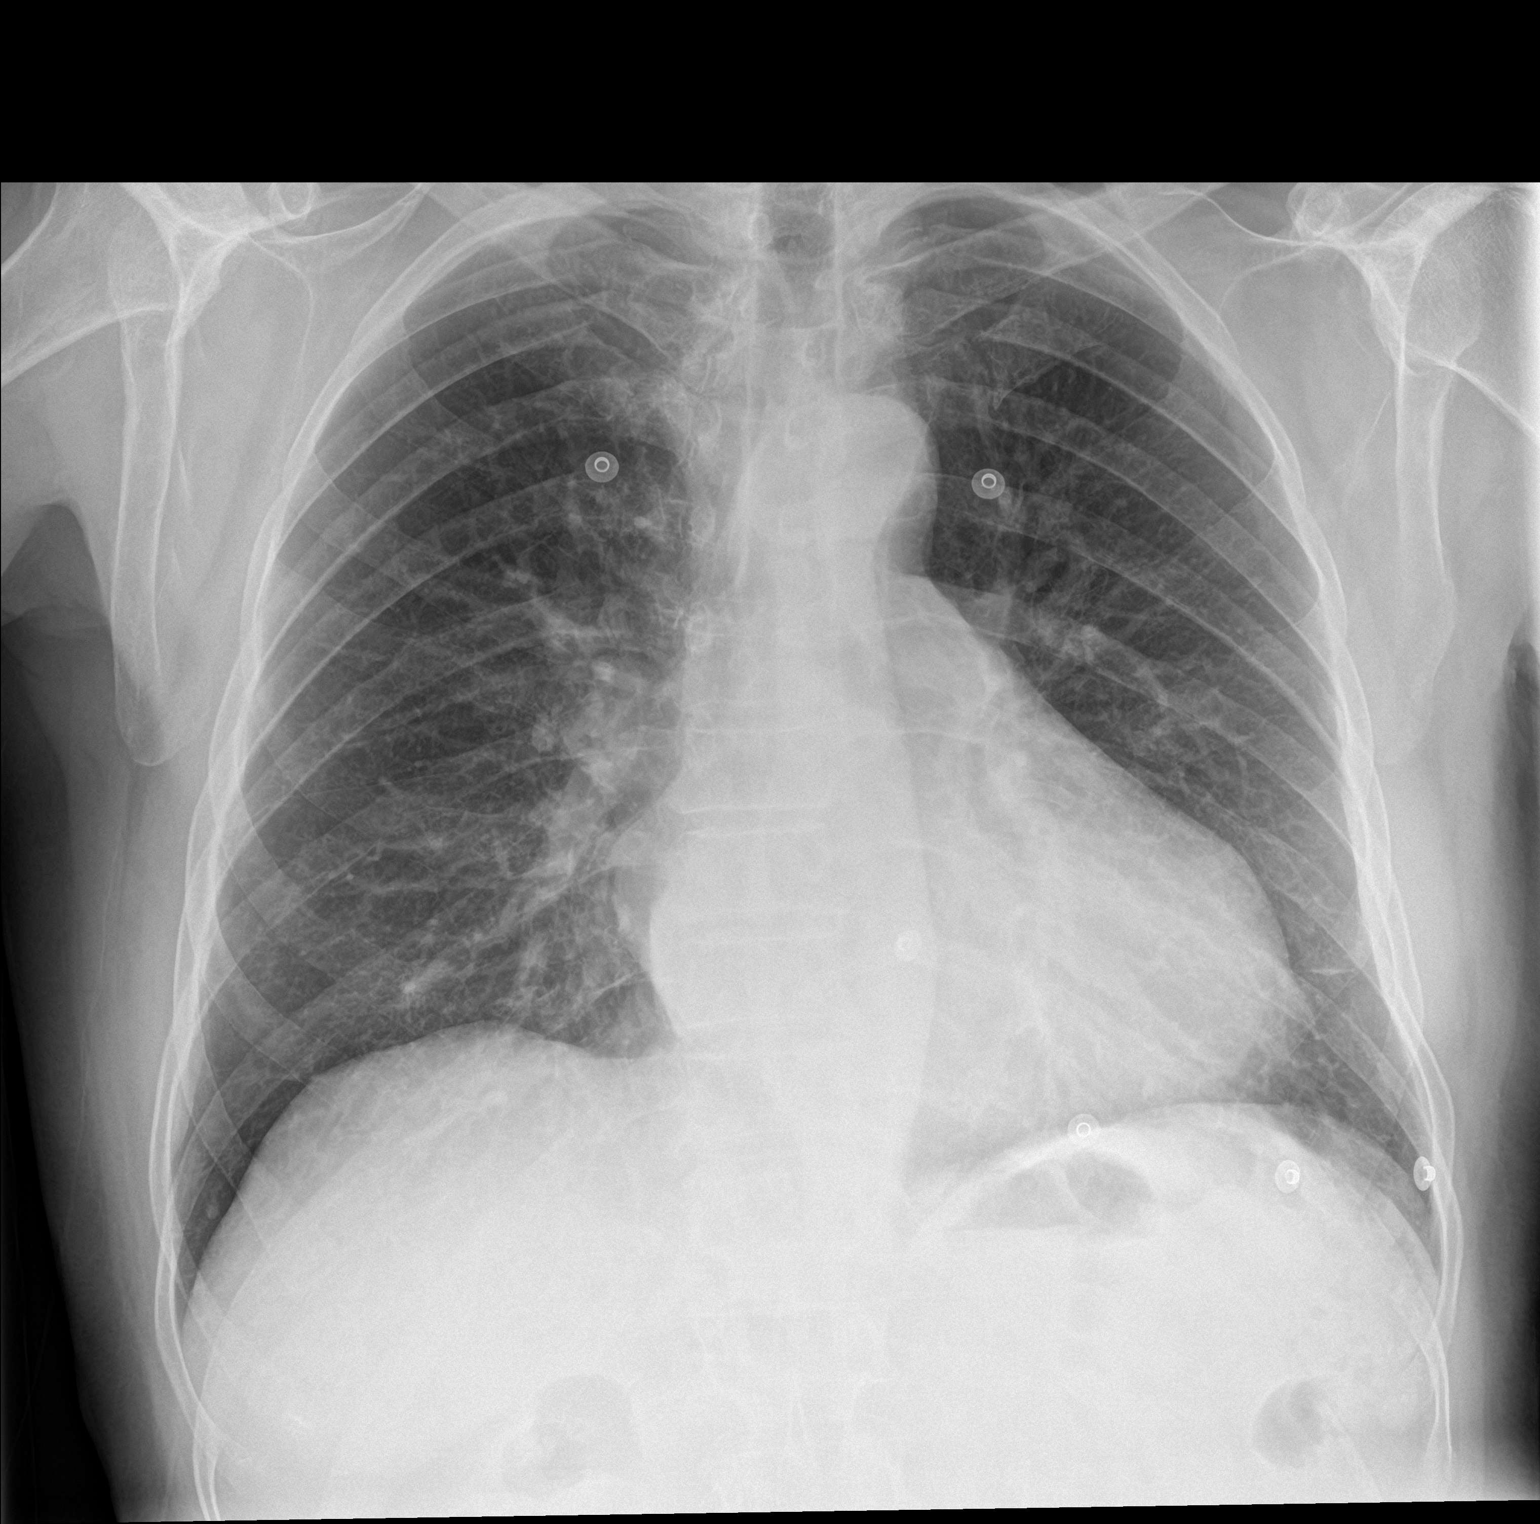

[chest lat]
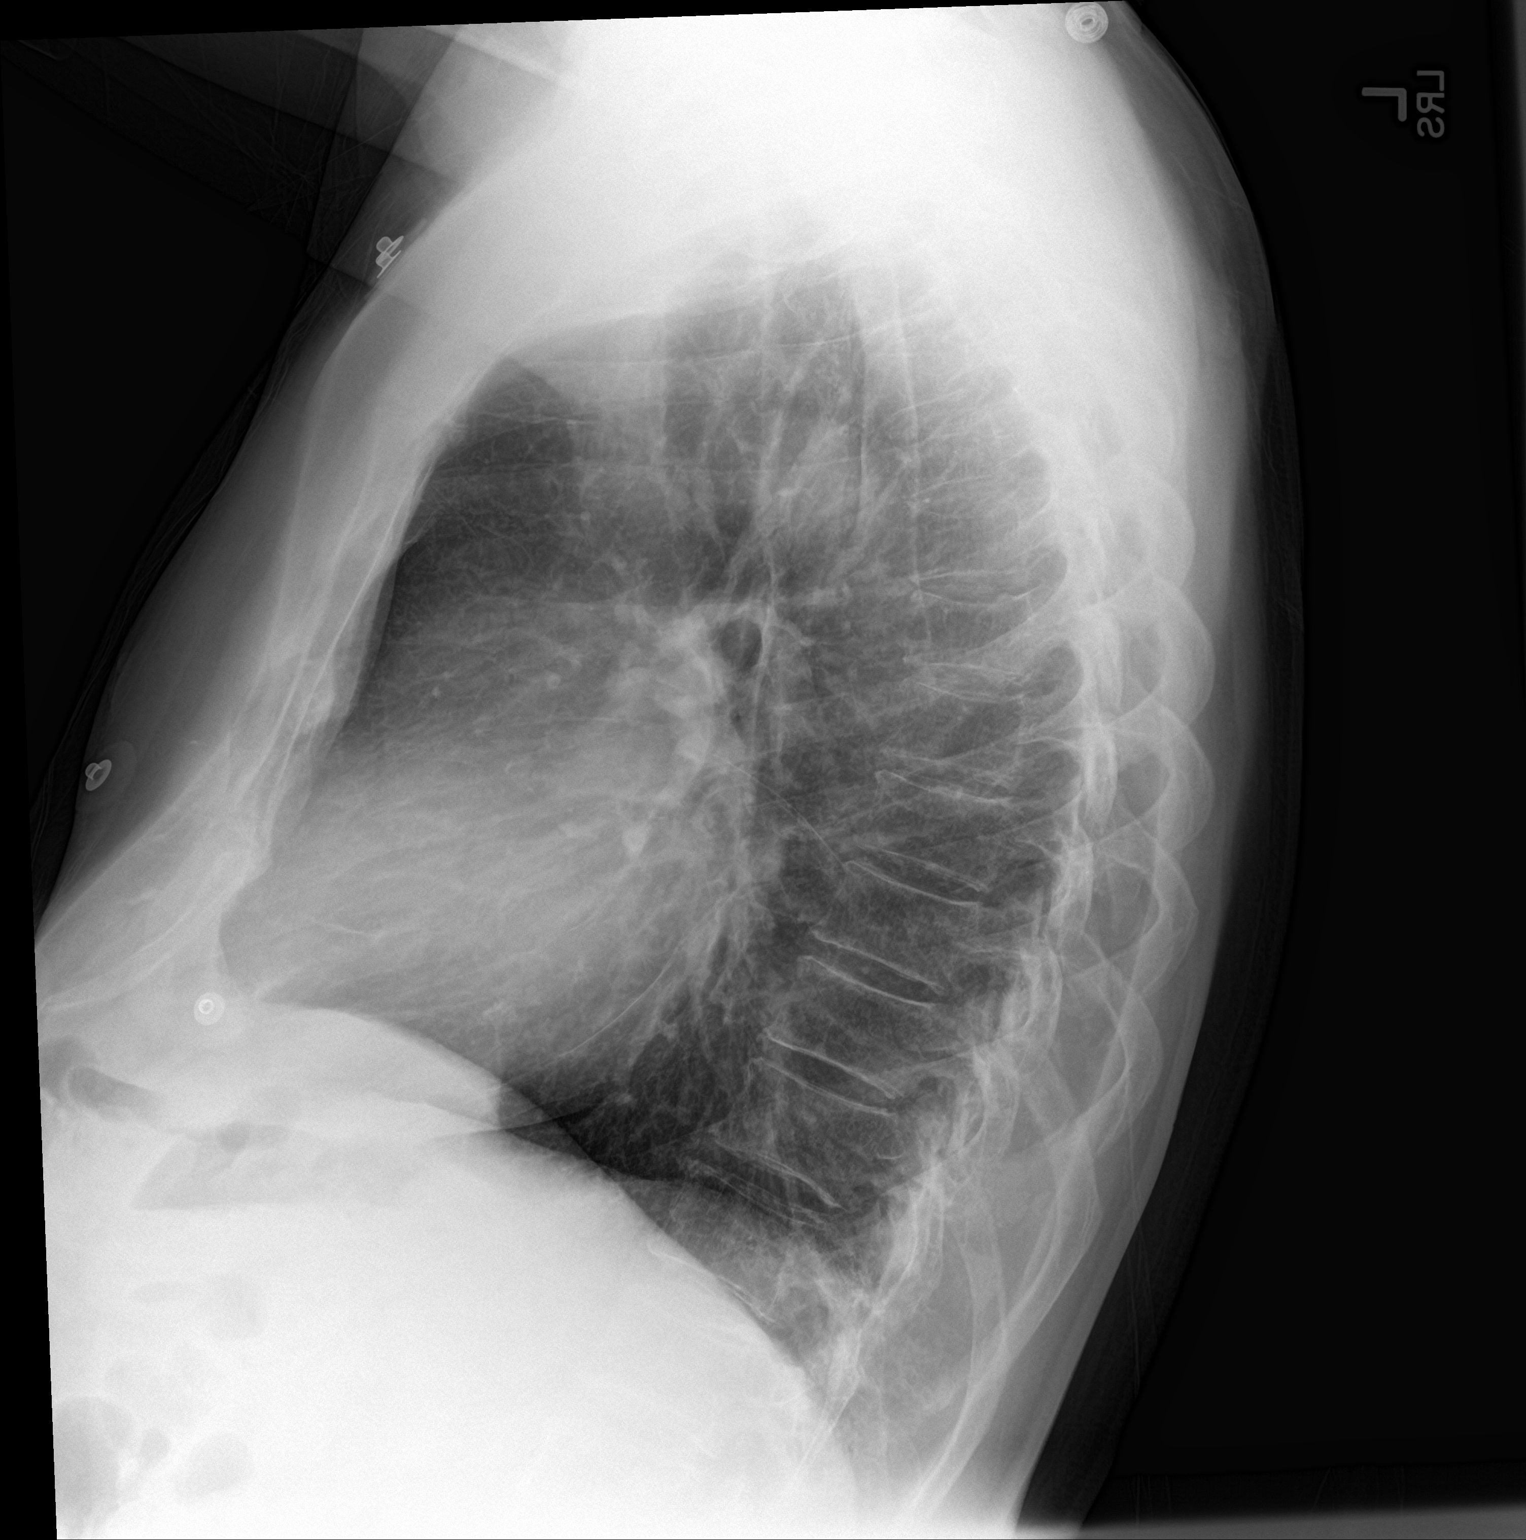

[2 of 2 positions shown; findings below may reference images not displayed]

FINDINGS: Grossly unchanged enlarged cardiac silhouette. Unchanged mediastinal
contours with mild tortuosity of the thoracic aorta. Linear
heterogeneous opacities within the left lower lung are unchanged and
favored to represent atelectasis or scar. No focal airspace
opacities. Punctate (approximately 4 mm) granuloma within the right
costophrenic angle is unchanged since the [DATE] examination and
thus of benign etiology. No pleural effusion or pneumothorax. No
evidence of edema. No acute osseus abnormalities.
IMPRESSION: Cardiomegaly without acute cardiopulmonary disease. Specifically, no
evidence of edema.

## 2019-02-28 IMAGING — DX DG CHEST 1V PORT
1 series · 1 of 1 positions shown · non-contrast
Comparison: Radiograph 10/22/2016

CLINICAL DATA: Shortness of breath tonight.

EXAM:
PORTABLE CHEST 1 VIEW

[chest ap]
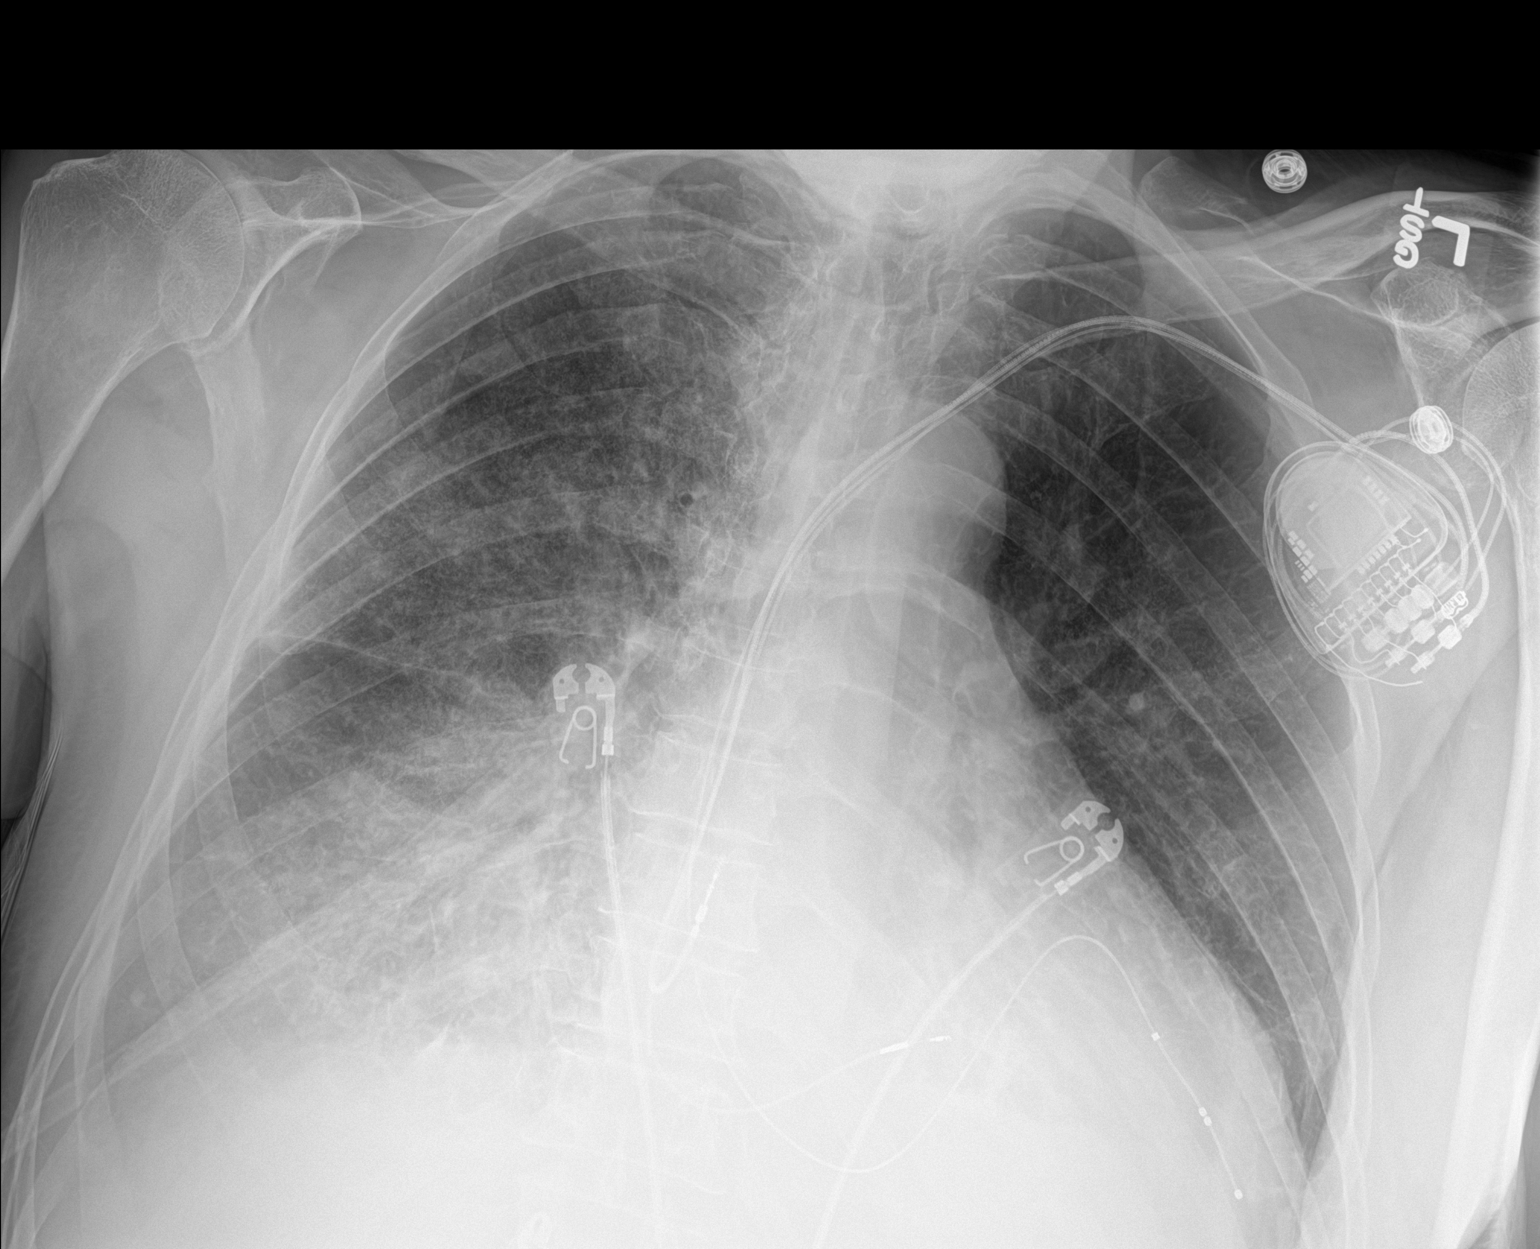

[1 of 1 positions shown; findings below may reference images not displayed]

FINDINGS: Left-sided pacemaker in place. Stable cardiomegaly. Development of
diffuse interstitial and alveolar opacities throughout the right
lung. Bilateral pleural effusions with fluid in the right minor
fissure. No pneumothorax.
IMPRESSION: Diffuse interstitial and alveolar opacities throughout the right
lung, which in conjunction with cardiomegaly and pleural effusions
are suspicious for pulmonary edema and CHF. Alternatively, atypical
pneumonia is considered but felt less likely.

## 2019-03-01 IMAGING — DX DG CHEST 1V PORT
1 series · 1 of 1 positions shown · non-contrast
Comparison: Chest x-ray from yesterday.

CLINICAL DATA: Shortness of breath.

EXAM:
PORTABLE CHEST 1 VIEW

[chest ap]
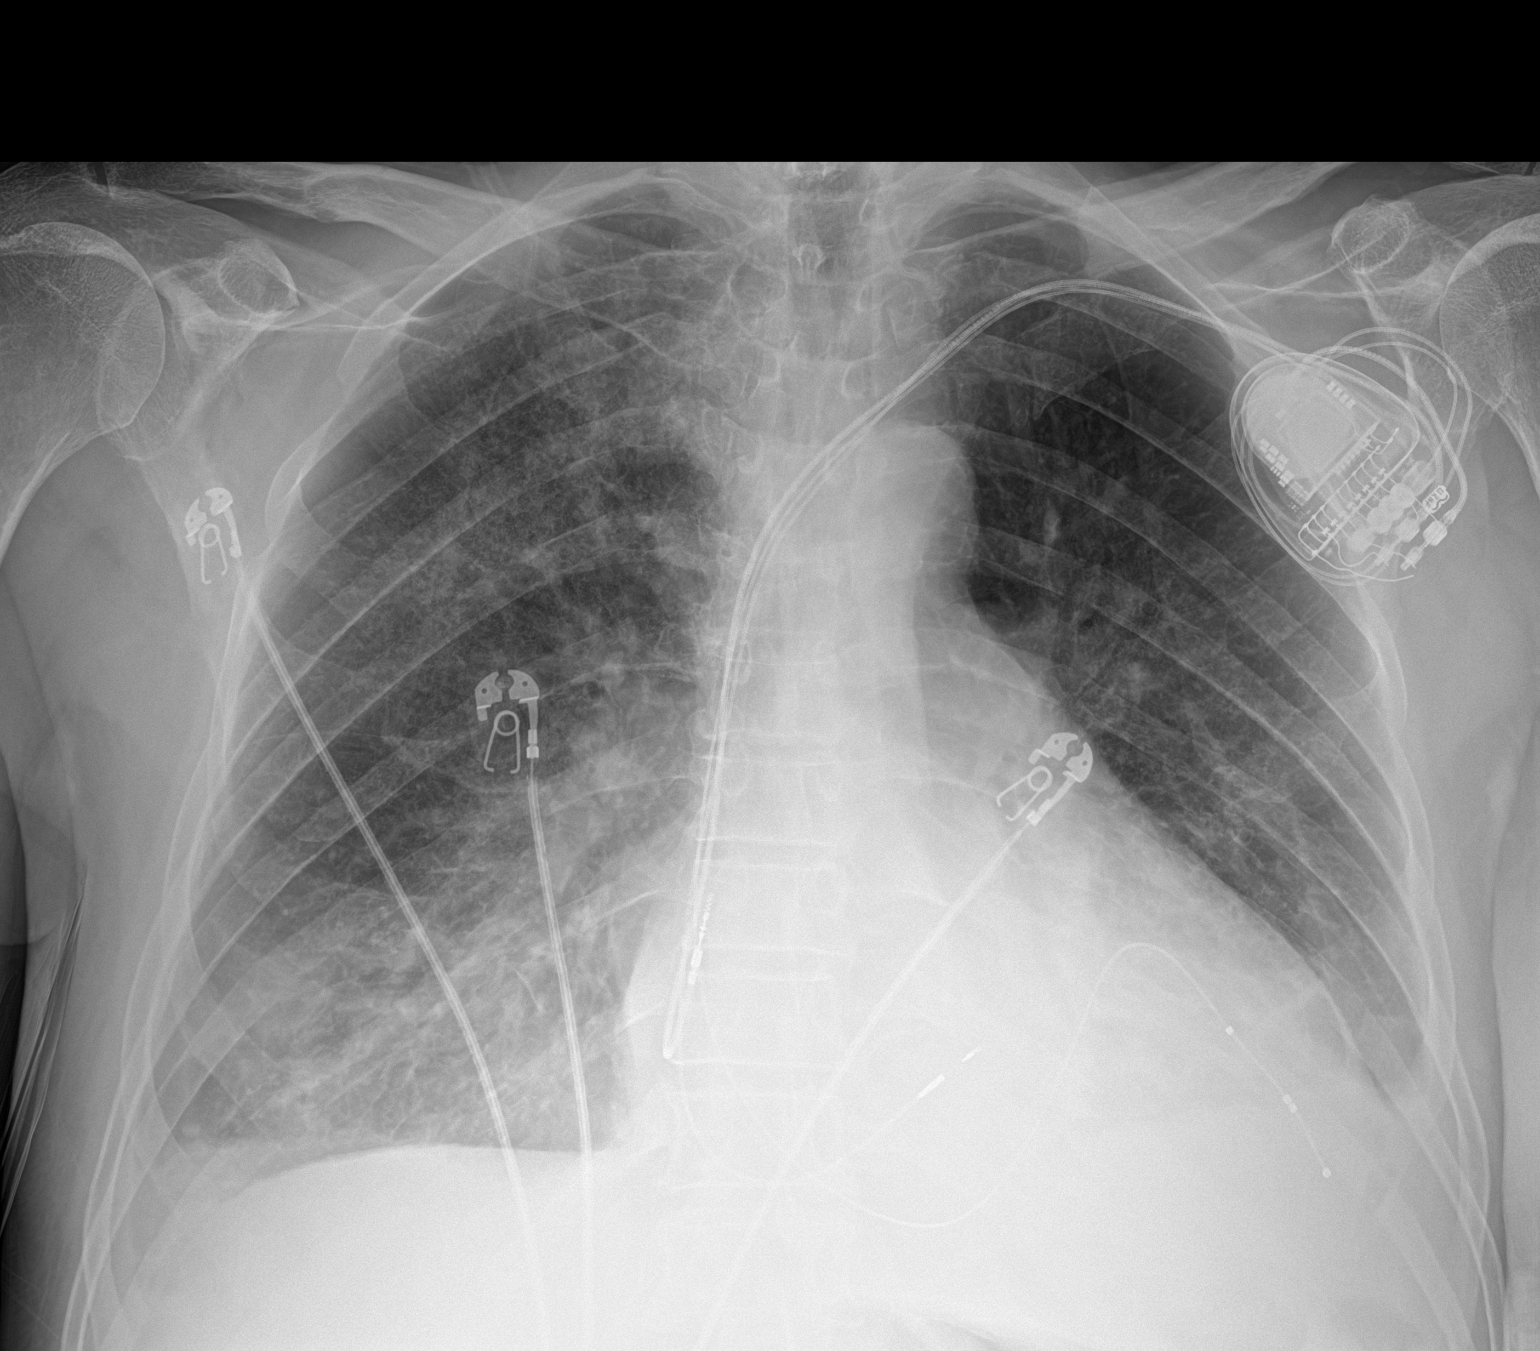

[1 of 1 positions shown; findings below may reference images not displayed]

FINDINGS: Stable cardiomegaly. Unchanged left chest wall pacer device.
Improved interstitial and alveolar opacities in the right lower
lung. More fine, granular opacities in the right upper lobe are
similar to prior study. Decreased right pleural effusion. Unchanged
small left pleural effusion with adjacent basilar atelectasis. No
pneumothorax. No acute osseous abnormality.
IMPRESSION: 1. Improving pulmonary edema in the right lung with decreased right
pleural effusion. More fine, granular opacities in the right upper
lobe could reflect superimposed pneumonia.
2. Unchanged small left pleural effusion and left basilar
atelectasis.

## 2019-03-08 IMAGING — DX DG CHEST 1V PORT
1 series · 1 of 1 positions shown · non-contrast
Comparison: Chest x-ray of February 07, 2017

CLINICAL DATA: Shortness of breath, history of coronary artery
disease, pneumonia, endocarditis.

EXAM:
PORTABLE CHEST 1 VIEW

[chest]
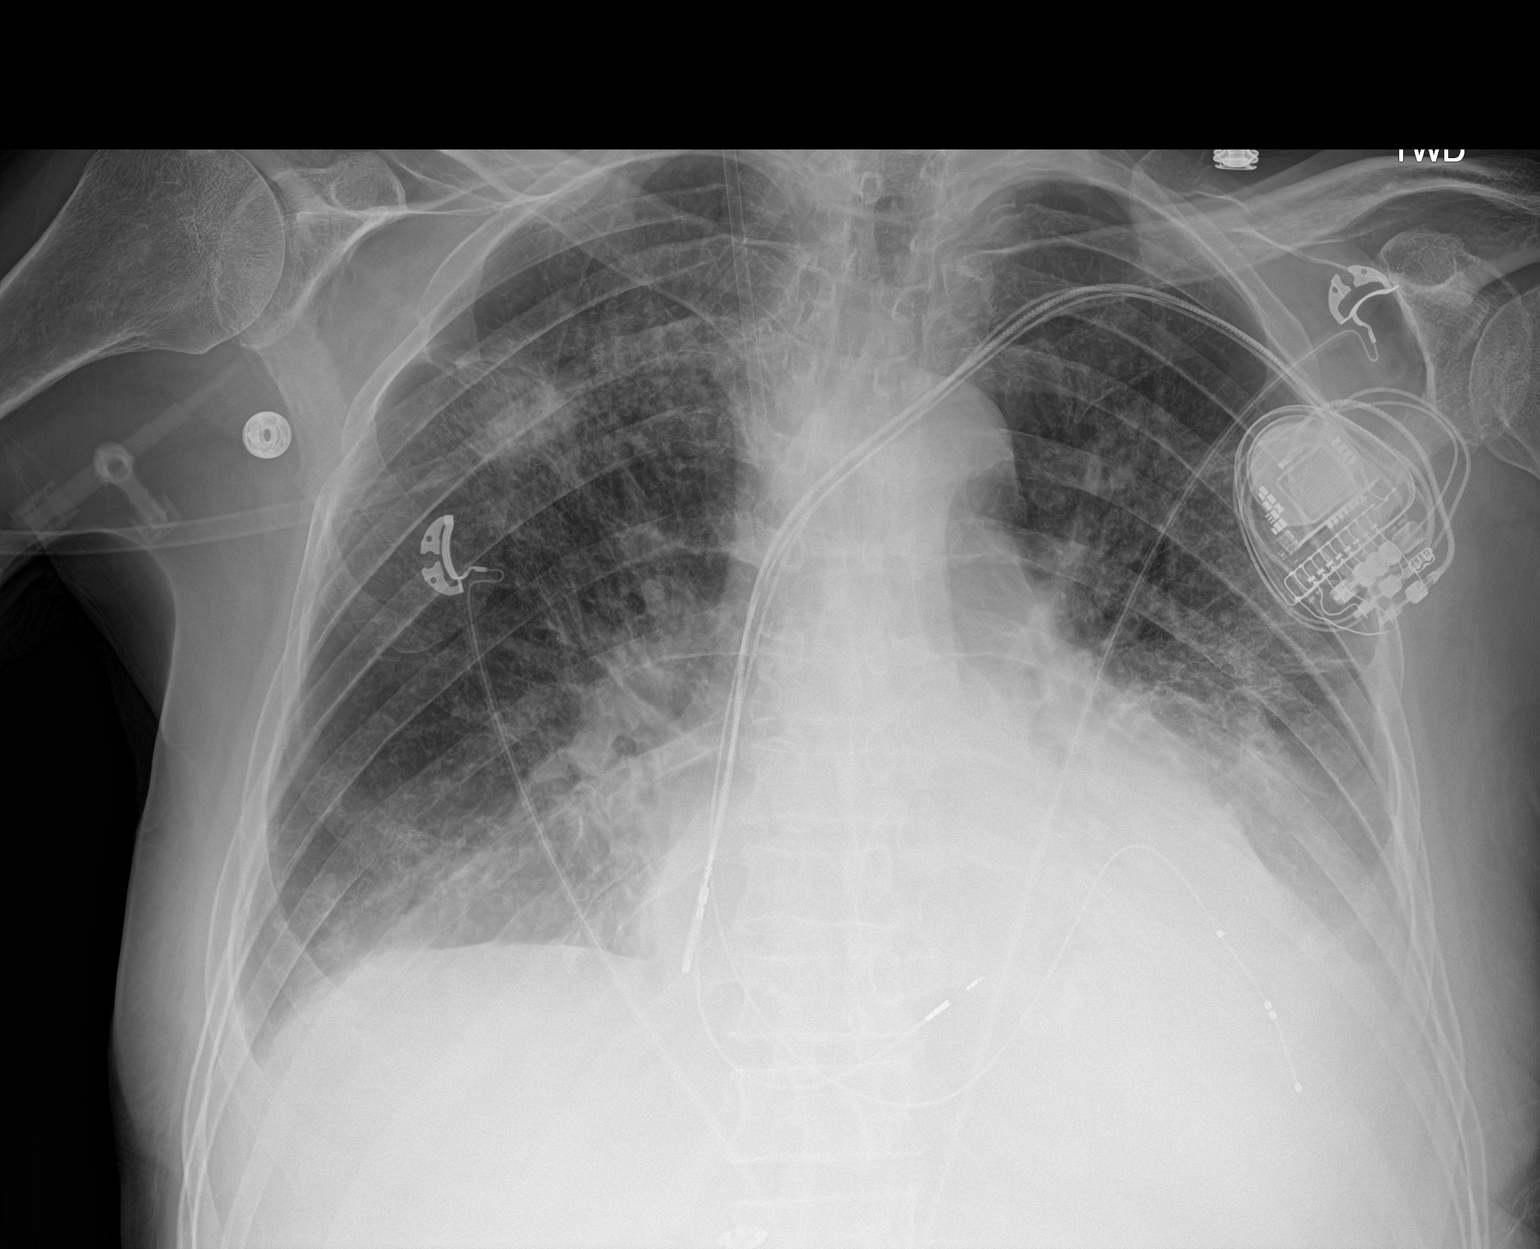

[1 of 1 positions shown; findings below may reference images not displayed]

FINDINGS: Or oral wall the cardiac silhouette is mildly enlarged. The
pulmonary vascularity is engorged. There are small bilateral pleural
effusions. There is patchy alveolar opacity in the right upper lobe.
There is increased density at the left lung base. The ICD is in
stable position.
IMPRESSION: CHF with pulmonary interstitial edema and small bilateral pleural
effusions. Confluent airspace opacity now present in the right upper
lobe is worrisome for pneumonia. Left lower lobe atelectasis or
pneumonia persists.
